# Patient Record
Sex: Female | Born: 1940
Health system: Southern US, Community
[De-identification: ages and names within clinical notes are randomized; demographics above are authoritative.]

## PROBLEM LIST (undated history)

## (undated) DIAGNOSIS — I651 Occlusion and stenosis of basilar artery: Secondary | ICD-10-CM

## (undated) DIAGNOSIS — I10 Essential (primary) hypertension: Secondary | ICD-10-CM

## (undated) DIAGNOSIS — N3289 Other specified disorders of bladder: Secondary | ICD-10-CM

## (undated) DIAGNOSIS — T883XXA Malignant hyperthermia due to anesthesia, initial encounter: Secondary | ICD-10-CM

## (undated) DIAGNOSIS — I2510 Atherosclerotic heart disease of native coronary artery without angina pectoris: Secondary | ICD-10-CM

## (undated) DIAGNOSIS — N3010 Interstitial cystitis (chronic) without hematuria: Secondary | ICD-10-CM

## (undated) DIAGNOSIS — E039 Hypothyroidism, unspecified: Secondary | ICD-10-CM

## (undated) DIAGNOSIS — Z8489 Family history of other specified conditions: Secondary | ICD-10-CM

## (undated) DIAGNOSIS — R0989 Other specified symptoms and signs involving the circulatory and respiratory systems: Secondary | ICD-10-CM

## (undated) DIAGNOSIS — M797 Fibromyalgia: Secondary | ICD-10-CM

## (undated) DIAGNOSIS — Z950 Presence of cardiac pacemaker: Secondary | ICD-10-CM

## (undated) DIAGNOSIS — E669 Obesity, unspecified: Secondary | ICD-10-CM

## (undated) DIAGNOSIS — M1990 Unspecified osteoarthritis, unspecified site: Secondary | ICD-10-CM

## (undated) DIAGNOSIS — G459 Transient cerebral ischemic attack, unspecified: Secondary | ICD-10-CM

## (undated) DIAGNOSIS — I441 Atrioventricular block, second degree: Secondary | ICD-10-CM

## (undated) DIAGNOSIS — Z5189 Encounter for other specified aftercare: Secondary | ICD-10-CM

## (undated) DIAGNOSIS — J189 Pneumonia, unspecified organism: Secondary | ICD-10-CM

## (undated) DIAGNOSIS — I6522 Occlusion and stenosis of left carotid artery: Secondary | ICD-10-CM

## (undated) DIAGNOSIS — IMO0001 Reserved for inherently not codable concepts without codable children: Secondary | ICD-10-CM

## (undated) DIAGNOSIS — E785 Hyperlipidemia, unspecified: Secondary | ICD-10-CM

## (undated) HISTORY — DX: Occlusion and stenosis of basilar artery: I65.1

## (undated) HISTORY — DX: Hyperlipidemia, unspecified: E78.5

## (undated) HISTORY — PX: EYE SURGERY: SHX253

## (undated) HISTORY — PX: OTHER SURGICAL HISTORY: SHX169

## (undated) HISTORY — DX: Atherosclerotic heart disease of native coronary artery without angina pectoris: I25.10

## (undated) HISTORY — DX: Interstitial cystitis (chronic) without hematuria: N30.10

## (undated) HISTORY — DX: Fibromyalgia: M79.7

## (undated) HISTORY — DX: Other specified symptoms and signs involving the circulatory and respiratory systems: R09.89

## (undated) HISTORY — DX: Occlusion and stenosis of left carotid artery: I65.22

## (undated) HISTORY — DX: Other specified disorders of bladder: N32.89

## (undated) HISTORY — DX: Atrioventricular block, second degree: I44.1

## (undated) HISTORY — PX: CATARACT EXTRACTION W/ INTRAOCULAR LENS IMPLANT: SHX1309

## (undated) HISTORY — DX: Hypothyroidism, unspecified: E03.9

## (undated) HISTORY — DX: Obesity, unspecified: E66.9

---

## 1972-01-06 HISTORY — PX: BLADDER SUSPENSION: SHX72

## 1972-01-06 HISTORY — PX: VAGINAL HYSTERECTOMY: SUR661

## 1983-01-06 HISTORY — PX: CHOLECYSTECTOMY: SHX55

## 1988-01-06 DIAGNOSIS — E039 Hypothyroidism, unspecified: Secondary | ICD-10-CM

## 1988-01-06 HISTORY — DX: Hypothyroidism, unspecified: E03.9

## 1991-01-06 HISTORY — PX: CARDIAC CATHETERIZATION: SHX172

## 1991-01-06 HISTORY — PX: RENAL ANGIOPLASTY: SHX2316

## 1991-08-06 DIAGNOSIS — R0989 Other specified symptoms and signs involving the circulatory and respiratory systems: Secondary | ICD-10-CM

## 1991-08-06 HISTORY — DX: Other specified symptoms and signs involving the circulatory and respiratory systems: R09.89

## 1991-11-06 HISTORY — PX: OTHER SURGICAL HISTORY: SHX169

## 1991-12-06 HISTORY — PX: US RENAL/AORTA: HXRAD530

## 1992-08-05 ENCOUNTER — Encounter: Payer: Self-pay | Admitting: Family Medicine

## 1996-04-05 HISTORY — PX: OTHER SURGICAL HISTORY: SHX169

## 1997-01-05 ENCOUNTER — Encounter: Payer: Self-pay | Admitting: Family Medicine

## 1997-01-05 DIAGNOSIS — E785 Hyperlipidemia, unspecified: Secondary | ICD-10-CM

## 1997-01-05 HISTORY — DX: Hyperlipidemia, unspecified: E78.5

## 1999-01-06 HISTORY — PX: OTHER SURGICAL HISTORY: SHX169

## 1999-01-28 ENCOUNTER — Encounter: Payer: Self-pay | Admitting: Family Medicine

## 1999-01-28 ENCOUNTER — Encounter: Admission: RE | Admit: 1999-01-28 | Discharge: 1999-01-28 | Payer: Self-pay | Admitting: Family Medicine

## 2000-01-29 ENCOUNTER — Encounter: Payer: Self-pay | Admitting: Family Medicine

## 2000-01-29 ENCOUNTER — Encounter: Admission: RE | Admit: 2000-01-29 | Discharge: 2000-01-29 | Payer: Self-pay | Admitting: Family Medicine

## 2002-01-05 ENCOUNTER — Encounter: Payer: Self-pay | Admitting: Family Medicine

## 2002-01-05 LAB — CONVERTED CEMR LAB: Pap Smear: NORMAL

## 2002-01-26 ENCOUNTER — Other Ambulatory Visit: Admission: RE | Admit: 2002-01-26 | Discharge: 2002-01-26 | Payer: Self-pay | Admitting: Family Medicine

## 2002-02-06 ENCOUNTER — Encounter: Payer: Self-pay | Admitting: Family Medicine

## 2002-02-06 ENCOUNTER — Encounter: Admission: RE | Admit: 2002-02-06 | Discharge: 2002-02-06 | Payer: Self-pay | Admitting: Family Medicine

## 2003-01-30 HISTORY — PX: OTHER SURGICAL HISTORY: SHX169

## 2003-02-27 ENCOUNTER — Encounter: Admission: RE | Admit: 2003-02-27 | Discharge: 2003-02-27 | Payer: Self-pay | Admitting: Family Medicine

## 2003-11-21 ENCOUNTER — Ambulatory Visit: Payer: Self-pay | Admitting: Family Medicine

## 2003-12-19 ENCOUNTER — Ambulatory Visit: Payer: Self-pay | Admitting: Family Medicine

## 2004-01-16 ENCOUNTER — Ambulatory Visit: Payer: Self-pay | Admitting: Family Medicine

## 2004-02-19 ENCOUNTER — Ambulatory Visit: Payer: Self-pay | Admitting: Family Medicine

## 2004-02-21 ENCOUNTER — Ambulatory Visit: Payer: Self-pay | Admitting: Family Medicine

## 2004-03-05 ENCOUNTER — Ambulatory Visit: Payer: Self-pay | Admitting: Family Medicine

## 2004-03-06 ENCOUNTER — Encounter: Admission: RE | Admit: 2004-03-06 | Discharge: 2004-03-06 | Payer: Self-pay | Admitting: Family Medicine

## 2004-03-10 ENCOUNTER — Ambulatory Visit: Payer: Self-pay | Admitting: Family Medicine

## 2004-03-21 ENCOUNTER — Ambulatory Visit: Payer: Self-pay | Admitting: Family Medicine

## 2004-03-28 ENCOUNTER — Ambulatory Visit: Payer: Self-pay | Admitting: Family Medicine

## 2004-04-11 ENCOUNTER — Ambulatory Visit: Payer: Self-pay | Admitting: Family Medicine

## 2004-05-09 ENCOUNTER — Ambulatory Visit: Payer: Self-pay | Admitting: Family Medicine

## 2004-06-06 ENCOUNTER — Ambulatory Visit: Payer: Self-pay | Admitting: Family Medicine

## 2004-06-13 ENCOUNTER — Emergency Department: Payer: Self-pay | Admitting: Emergency Medicine

## 2004-06-15 ENCOUNTER — Emergency Department: Payer: Self-pay | Admitting: Emergency Medicine

## 2004-07-09 ENCOUNTER — Ambulatory Visit: Payer: Self-pay | Admitting: Family Medicine

## 2004-08-07 ENCOUNTER — Ambulatory Visit: Payer: Self-pay | Admitting: Family Medicine

## 2004-08-19 ENCOUNTER — Ambulatory Visit: Payer: Self-pay | Admitting: Family Medicine

## 2004-09-09 ENCOUNTER — Ambulatory Visit: Payer: Self-pay | Admitting: Family Medicine

## 2004-10-07 ENCOUNTER — Ambulatory Visit: Payer: Self-pay | Admitting: Family Medicine

## 2004-10-15 HISTORY — PX: OTHER SURGICAL HISTORY: SHX169

## 2004-10-28 ENCOUNTER — Ambulatory Visit: Payer: Self-pay | Admitting: Family Medicine

## 2004-11-17 ENCOUNTER — Ambulatory Visit: Payer: Self-pay | Admitting: Family Medicine

## 2004-12-15 ENCOUNTER — Ambulatory Visit: Payer: Self-pay | Admitting: Family Medicine

## 2005-01-12 ENCOUNTER — Ambulatory Visit: Payer: Self-pay | Admitting: Family Medicine

## 2005-01-27 ENCOUNTER — Ambulatory Visit: Payer: Self-pay | Admitting: Family Medicine

## 2005-02-04 ENCOUNTER — Ambulatory Visit: Payer: Self-pay | Admitting: Family Medicine

## 2005-02-10 ENCOUNTER — Ambulatory Visit: Payer: Self-pay | Admitting: Family Medicine

## 2005-02-18 ENCOUNTER — Ambulatory Visit: Payer: Self-pay | Admitting: Family Medicine

## 2005-03-05 ENCOUNTER — Encounter: Payer: Self-pay | Admitting: Family Medicine

## 2005-03-05 LAB — CONVERTED CEMR LAB: Hgb A1c MFr Bld: 6.2 %

## 2005-03-09 ENCOUNTER — Encounter: Admission: RE | Admit: 2005-03-09 | Discharge: 2005-03-09 | Payer: Self-pay | Admitting: Family Medicine

## 2005-03-10 ENCOUNTER — Ambulatory Visit: Payer: Self-pay | Admitting: Family Medicine

## 2005-03-12 ENCOUNTER — Ambulatory Visit: Payer: Self-pay | Admitting: Family Medicine

## 2005-03-19 ENCOUNTER — Ambulatory Visit: Payer: Self-pay | Admitting: Family Medicine

## 2005-04-07 ENCOUNTER — Ambulatory Visit: Payer: Self-pay | Admitting: Family Medicine

## 2005-05-05 ENCOUNTER — Ambulatory Visit: Payer: Self-pay | Admitting: Family Medicine

## 2005-05-05 LAB — CONVERTED CEMR LAB: Hgb A1c MFr Bld: 5.9 %

## 2005-05-19 ENCOUNTER — Ambulatory Visit: Payer: Self-pay | Admitting: Family Medicine

## 2005-06-03 ENCOUNTER — Ambulatory Visit: Payer: Self-pay | Admitting: Family Medicine

## 2005-06-10 ENCOUNTER — Ambulatory Visit: Payer: Self-pay | Admitting: Family Medicine

## 2005-06-24 ENCOUNTER — Ambulatory Visit: Payer: Self-pay | Admitting: Family Medicine

## 2005-07-09 ENCOUNTER — Ambulatory Visit: Payer: Self-pay | Admitting: Family Medicine

## 2005-07-23 ENCOUNTER — Ambulatory Visit: Payer: Self-pay | Admitting: Family Medicine

## 2005-08-20 ENCOUNTER — Ambulatory Visit: Payer: Self-pay | Admitting: Family Medicine

## 2005-09-17 ENCOUNTER — Ambulatory Visit: Payer: Self-pay | Admitting: Family Medicine

## 2005-10-15 ENCOUNTER — Ambulatory Visit: Payer: Self-pay | Admitting: Family Medicine

## 2005-11-05 ENCOUNTER — Encounter: Payer: Self-pay | Admitting: Family Medicine

## 2005-11-05 LAB — CONVERTED CEMR LAB: Hgb A1c MFr Bld: 6.1 %

## 2005-11-12 ENCOUNTER — Ambulatory Visit: Payer: Self-pay | Admitting: Family Medicine

## 2005-12-03 ENCOUNTER — Ambulatory Visit: Payer: Self-pay | Admitting: Family Medicine

## 2005-12-11 ENCOUNTER — Ambulatory Visit: Payer: Self-pay | Admitting: Family Medicine

## 2006-01-08 ENCOUNTER — Ambulatory Visit: Payer: Self-pay | Admitting: Family Medicine

## 2006-02-05 ENCOUNTER — Ambulatory Visit: Payer: Self-pay | Admitting: Family Medicine

## 2006-03-05 ENCOUNTER — Ambulatory Visit: Payer: Self-pay | Admitting: Family Medicine

## 2006-04-06 ENCOUNTER — Ambulatory Visit: Payer: Self-pay | Admitting: Ophthalmology

## 2006-04-12 ENCOUNTER — Ambulatory Visit: Payer: Self-pay | Admitting: Family Medicine

## 2006-04-13 ENCOUNTER — Ambulatory Visit: Payer: Self-pay | Admitting: Ophthalmology

## 2006-04-27 ENCOUNTER — Ambulatory Visit: Payer: Self-pay | Admitting: Family Medicine

## 2006-04-28 ENCOUNTER — Encounter: Payer: Self-pay | Admitting: Family Medicine

## 2006-04-28 DIAGNOSIS — N951 Menopausal and female climacteric states: Secondary | ICD-10-CM | POA: Insufficient documentation

## 2006-04-28 DIAGNOSIS — H3320 Serous retinal detachment, unspecified eye: Secondary | ICD-10-CM | POA: Insufficient documentation

## 2006-04-28 DIAGNOSIS — G43009 Migraine without aura, not intractable, without status migrainosus: Secondary | ICD-10-CM | POA: Insufficient documentation

## 2006-04-28 DIAGNOSIS — IMO0001 Reserved for inherently not codable concepts without codable children: Secondary | ICD-10-CM

## 2006-05-04 ENCOUNTER — Ambulatory Visit: Payer: Self-pay | Admitting: Ophthalmology

## 2006-05-11 ENCOUNTER — Ambulatory Visit: Payer: Self-pay | Admitting: Ophthalmology

## 2006-05-18 ENCOUNTER — Ambulatory Visit: Payer: Self-pay | Admitting: Family Medicine

## 2006-05-18 DIAGNOSIS — I10 Essential (primary) hypertension: Secondary | ICD-10-CM | POA: Insufficient documentation

## 2006-05-18 LAB — CONVERTED CEMR LAB: INR: 2.3

## 2006-06-15 ENCOUNTER — Ambulatory Visit: Payer: Self-pay | Admitting: Family Medicine

## 2006-07-15 ENCOUNTER — Ambulatory Visit: Payer: Self-pay | Admitting: Family Medicine

## 2006-07-15 LAB — CONVERTED CEMR LAB: Prothrombin Time: 22.2 s

## 2006-08-12 ENCOUNTER — Ambulatory Visit: Payer: Self-pay | Admitting: Family Medicine

## 2006-08-12 LAB — CONVERTED CEMR LAB: INR: 2.3

## 2006-09-09 ENCOUNTER — Telehealth (INDEPENDENT_AMBULATORY_CARE_PROVIDER_SITE_OTHER): Payer: Self-pay | Admitting: *Deleted

## 2006-09-09 ENCOUNTER — Ambulatory Visit: Payer: Self-pay | Admitting: Family Medicine

## 2006-09-09 LAB — CONVERTED CEMR LAB
INR: 2.1
Prothrombin Time: 17.7 s

## 2006-10-07 ENCOUNTER — Ambulatory Visit: Payer: Self-pay | Admitting: Family Medicine

## 2006-10-07 LAB — CONVERTED CEMR LAB: INR: 2.2

## 2006-11-11 ENCOUNTER — Ambulatory Visit: Payer: Self-pay | Admitting: Family Medicine

## 2006-12-09 ENCOUNTER — Ambulatory Visit: Payer: Self-pay | Admitting: Family Medicine

## 2006-12-09 LAB — CONVERTED CEMR LAB: INR: 2.4

## 2006-12-28 ENCOUNTER — Ambulatory Visit: Payer: Self-pay | Admitting: Family Medicine

## 2006-12-28 DIAGNOSIS — E7800 Pure hypercholesterolemia, unspecified: Secondary | ICD-10-CM | POA: Insufficient documentation

## 2006-12-28 LAB — CONVERTED CEMR LAB
BUN: 12 mg/dL (ref 6–23)
Basophils Relative: 0 % (ref 0.0–1.0)
Bilirubin, Direct: 0.2 mg/dL (ref 0.0–0.3)
CO2: 33 meq/L — ABNORMAL HIGH (ref 19–32)
Eosinophils Relative: 5.2 % — ABNORMAL HIGH (ref 0.0–5.0)
GFR calc Af Amer: 81 mL/min
Glucose, Bld: 119 mg/dL — ABNORMAL HIGH (ref 70–99)
Hemoglobin: 15.1 g/dL — ABNORMAL HIGH (ref 12.0–15.0)
Lymphocytes Relative: 30.3 % (ref 12.0–46.0)
Monocytes Absolute: 0.5 10*3/uL (ref 0.2–0.7)
Monocytes Relative: 8.6 % (ref 3.0–11.0)
Neutro Abs: 3.4 10*3/uL (ref 1.4–7.7)
Potassium: 3.4 meq/L — ABNORMAL LOW (ref 3.5–5.1)
TSH: 0.6 microintl units/mL (ref 0.35–5.50)
Total Protein: 6.5 g/dL (ref 6.0–8.3)

## 2007-01-05 ENCOUNTER — Ambulatory Visit: Payer: Self-pay | Admitting: Family Medicine

## 2007-01-13 ENCOUNTER — Encounter: Admission: RE | Admit: 2007-01-13 | Discharge: 2007-01-13 | Payer: Self-pay | Admitting: Family Medicine

## 2007-01-18 ENCOUNTER — Encounter (INDEPENDENT_AMBULATORY_CARE_PROVIDER_SITE_OTHER): Payer: Self-pay | Admitting: *Deleted

## 2007-01-21 ENCOUNTER — Ambulatory Visit: Payer: Self-pay | Admitting: Family Medicine

## 2007-01-24 ENCOUNTER — Encounter (INDEPENDENT_AMBULATORY_CARE_PROVIDER_SITE_OTHER): Payer: Self-pay | Admitting: *Deleted

## 2007-01-27 ENCOUNTER — Telehealth (INDEPENDENT_AMBULATORY_CARE_PROVIDER_SITE_OTHER): Payer: Self-pay | Admitting: *Deleted

## 2007-01-28 ENCOUNTER — Encounter: Payer: Self-pay | Admitting: Family Medicine

## 2007-02-02 ENCOUNTER — Ambulatory Visit: Payer: Self-pay | Admitting: Family Medicine

## 2007-02-02 LAB — CONVERTED CEMR LAB: INR: 3.2

## 2007-02-07 ENCOUNTER — Ambulatory Visit: Payer: Self-pay | Admitting: Family Medicine

## 2007-02-09 ENCOUNTER — Telehealth (INDEPENDENT_AMBULATORY_CARE_PROVIDER_SITE_OTHER): Payer: Self-pay | Admitting: *Deleted

## 2007-02-17 ENCOUNTER — Telehealth: Payer: Self-pay | Admitting: Family Medicine

## 2007-02-17 ENCOUNTER — Ambulatory Visit: Payer: Self-pay | Admitting: Family Medicine

## 2007-03-03 ENCOUNTER — Ambulatory Visit: Payer: Self-pay | Admitting: Family Medicine

## 2007-03-09 ENCOUNTER — Telehealth: Payer: Self-pay | Admitting: Family Medicine

## 2007-03-23 ENCOUNTER — Emergency Department: Payer: Self-pay | Admitting: Emergency Medicine

## 2007-03-24 ENCOUNTER — Ambulatory Visit: Payer: Self-pay | Admitting: Family Medicine

## 2007-03-24 LAB — CONVERTED CEMR LAB
INR: 2.8
Prothrombin Time: 20.2 s

## 2007-04-19 ENCOUNTER — Ambulatory Visit: Payer: Self-pay | Admitting: Family Medicine

## 2007-04-19 LAB — CONVERTED CEMR LAB: Prothrombin Time: 18.4 s

## 2007-05-18 ENCOUNTER — Ambulatory Visit: Payer: Self-pay | Admitting: Family Medicine

## 2007-05-18 LAB — CONVERTED CEMR LAB: Prothrombin Time: 19.2 s

## 2007-06-14 ENCOUNTER — Ambulatory Visit: Payer: Self-pay | Admitting: Family Medicine

## 2007-06-14 LAB — CONVERTED CEMR LAB: Prothrombin Time: 18.2 s

## 2007-07-12 ENCOUNTER — Ambulatory Visit: Payer: Self-pay | Admitting: Family Medicine

## 2007-07-12 LAB — CONVERTED CEMR LAB: INR: 2.6

## 2007-08-09 ENCOUNTER — Ambulatory Visit: Payer: Self-pay | Admitting: Family Medicine

## 2007-08-09 LAB — CONVERTED CEMR LAB: INR: 2.8

## 2007-09-06 ENCOUNTER — Ambulatory Visit: Payer: Self-pay | Admitting: Family Medicine

## 2007-10-04 ENCOUNTER — Ambulatory Visit: Payer: Self-pay | Admitting: Family Medicine

## 2007-10-04 LAB — CONVERTED CEMR LAB
INR: 2.1
Prothrombin Time: 17.9 s

## 2007-10-28 ENCOUNTER — Telehealth: Payer: Self-pay | Admitting: Family Medicine

## 2007-11-10 ENCOUNTER — Telehealth: Payer: Self-pay | Admitting: Family Medicine

## 2007-11-10 ENCOUNTER — Ambulatory Visit: Payer: Self-pay | Admitting: Family Medicine

## 2007-11-10 LAB — CONVERTED CEMR LAB: INR: 4.1

## 2007-11-23 ENCOUNTER — Ambulatory Visit: Payer: Self-pay | Admitting: Family Medicine

## 2007-11-23 LAB — CONVERTED CEMR LAB
INR: 2.5
Prothrombin Time: 19.3 s

## 2007-12-22 ENCOUNTER — Ambulatory Visit: Payer: Self-pay | Admitting: Family Medicine

## 2008-01-07 ENCOUNTER — Telehealth: Payer: Self-pay | Admitting: Internal Medicine

## 2008-01-07 ENCOUNTER — Emergency Department (HOSPITAL_COMMUNITY): Admission: EM | Admit: 2008-01-07 | Discharge: 2008-01-07 | Payer: Self-pay | Admitting: Emergency Medicine

## 2008-01-18 ENCOUNTER — Ambulatory Visit: Payer: Self-pay | Admitting: Family Medicine

## 2008-01-18 LAB — CONVERTED CEMR LAB
Bacteria, UA: 0
Bilirubin Urine: NEGATIVE
Prothrombin Time: 17.5 s
RBC / HPF: 0
Specific Gravity, Urine: 1.015
Urobilinogen, UA: 0.2
WBC Urine, dipstick: NEGATIVE
pH: 5

## 2008-01-19 ENCOUNTER — Encounter: Payer: Self-pay | Admitting: Family Medicine

## 2008-02-13 ENCOUNTER — Telehealth (INDEPENDENT_AMBULATORY_CARE_PROVIDER_SITE_OTHER): Payer: Self-pay | Admitting: *Deleted

## 2008-02-15 ENCOUNTER — Ambulatory Visit: Payer: Self-pay | Admitting: Family Medicine

## 2008-02-15 LAB — CONVERTED CEMR LAB
INR: 2.2
Prothrombin Time: 18.3 s

## 2008-02-21 ENCOUNTER — Ambulatory Visit: Payer: Self-pay | Admitting: Family Medicine

## 2008-03-14 ENCOUNTER — Ambulatory Visit: Payer: Self-pay | Admitting: Family Medicine

## 2008-04-17 ENCOUNTER — Ambulatory Visit: Payer: Self-pay | Admitting: Family Medicine

## 2008-04-17 LAB — CONVERTED CEMR LAB: Prothrombin Time: 18.1 s

## 2008-05-15 ENCOUNTER — Ambulatory Visit: Payer: Self-pay | Admitting: Family Medicine

## 2008-05-15 LAB — CONVERTED CEMR LAB: INR: 2.2

## 2008-06-20 ENCOUNTER — Ambulatory Visit: Payer: Self-pay | Admitting: Family Medicine

## 2008-06-20 LAB — CONVERTED CEMR LAB
INR: 1.8
Prothrombin Time: 16.5 s

## 2008-06-21 LAB — CONVERTED CEMR LAB
ALT: 22 units/L (ref 0–35)
AST: 21 units/L (ref 0–37)
BUN: 16 mg/dL (ref 6–23)
Basophils Relative: 0.7 % (ref 0.0–3.0)
Bilirubin, Direct: 0.1 mg/dL (ref 0.0–0.3)
CO2: 32 meq/L (ref 19–32)
Calcium: 9.1 mg/dL (ref 8.4–10.5)
Cholesterol: 204 mg/dL — ABNORMAL HIGH (ref 0–200)
Eosinophils Relative: 3.7 % (ref 0.0–5.0)
Glucose, Bld: 105 mg/dL — ABNORMAL HIGH (ref 70–99)
HCT: 41.4 % (ref 36.0–46.0)
HDL: 37.7 mg/dL — ABNORMAL LOW (ref >39.00)
MCV: 88.1 fL (ref 78.0–100.0)
Monocytes Absolute: 0.5 10*3/uL (ref 0.1–1.0)
Monocytes Relative: 7.4 % (ref 3.0–12.0)
Neutrophils Relative %: 58.7 % (ref 43.0–77.0)
RBC: 4.7 M/uL (ref 3.87–5.11)
Sed Rate: 17 mm/hr (ref 0–22)
Sodium: 142 meq/L (ref 135–145)
Total Protein: 7 g/dL (ref 6.0–8.3)
Triglycerides: 183 mg/dL — ABNORMAL HIGH (ref 0.0–149.0)

## 2008-06-28 ENCOUNTER — Other Ambulatory Visit: Admission: RE | Admit: 2008-06-28 | Discharge: 2008-06-28 | Payer: Self-pay | Admitting: Family Medicine

## 2008-06-28 ENCOUNTER — Encounter: Payer: Self-pay | Admitting: Family Medicine

## 2008-06-28 ENCOUNTER — Ambulatory Visit: Payer: Self-pay | Admitting: Family Medicine

## 2008-06-28 DIAGNOSIS — R7309 Other abnormal glucose: Secondary | ICD-10-CM

## 2008-06-28 DIAGNOSIS — E559 Vitamin D deficiency, unspecified: Secondary | ICD-10-CM | POA: Insufficient documentation

## 2008-06-28 LAB — CONVERTED CEMR LAB
INR: 2.2
Pap Smear: NORMAL
Prothrombin Time: 18.2 s

## 2008-07-04 ENCOUNTER — Encounter (INDEPENDENT_AMBULATORY_CARE_PROVIDER_SITE_OTHER): Payer: Self-pay | Admitting: *Deleted

## 2008-07-10 ENCOUNTER — Ambulatory Visit: Payer: Self-pay | Admitting: Family Medicine

## 2008-07-10 DIAGNOSIS — R269 Unspecified abnormalities of gait and mobility: Secondary | ICD-10-CM | POA: Insufficient documentation

## 2008-07-10 DIAGNOSIS — I10 Essential (primary) hypertension: Secondary | ICD-10-CM | POA: Insufficient documentation

## 2008-07-11 ENCOUNTER — Encounter: Admission: RE | Admit: 2008-07-11 | Discharge: 2008-07-11 | Payer: Self-pay | Admitting: Family Medicine

## 2008-07-12 ENCOUNTER — Ambulatory Visit: Payer: Self-pay | Admitting: Family Medicine

## 2008-07-12 DIAGNOSIS — I651 Occlusion and stenosis of basilar artery: Secondary | ICD-10-CM

## 2008-07-17 ENCOUNTER — Ambulatory Visit: Payer: Self-pay | Admitting: Family Medicine

## 2008-07-18 ENCOUNTER — Encounter: Payer: Self-pay | Admitting: Interventional Radiology

## 2008-07-19 ENCOUNTER — Ambulatory Visit (HOSPITAL_COMMUNITY): Admission: RE | Admit: 2008-07-19 | Discharge: 2008-07-19 | Payer: Self-pay | Admitting: Interventional Radiology

## 2008-07-19 HISTORY — PX: OTHER SURGICAL HISTORY: SHX169

## 2008-07-20 ENCOUNTER — Telehealth: Payer: Self-pay | Admitting: Family Medicine

## 2008-07-20 ENCOUNTER — Encounter: Payer: Self-pay | Admitting: Family Medicine

## 2008-07-20 ENCOUNTER — Ambulatory Visit: Payer: Self-pay

## 2008-07-26 ENCOUNTER — Encounter: Payer: Self-pay | Admitting: Interventional Radiology

## 2008-08-04 ENCOUNTER — Telehealth: Payer: Self-pay | Admitting: Family Medicine

## 2008-09-04 ENCOUNTER — Inpatient Hospital Stay (HOSPITAL_COMMUNITY): Admission: RE | Admit: 2008-09-04 | Discharge: 2008-09-06 | Payer: Self-pay | Admitting: Interventional Radiology

## 2008-09-04 HISTORY — PX: OTHER SURGICAL HISTORY: SHX169

## 2008-09-06 ENCOUNTER — Telehealth (INDEPENDENT_AMBULATORY_CARE_PROVIDER_SITE_OTHER): Payer: Self-pay | Admitting: *Deleted

## 2008-09-19 ENCOUNTER — Ambulatory Visit: Payer: Self-pay | Admitting: Family Medicine

## 2008-09-19 ENCOUNTER — Encounter: Payer: Self-pay | Admitting: Interventional Radiology

## 2008-09-22 LAB — CONVERTED CEMR LAB: Vit D, 25-Hydroxy: 22 ng/mL — ABNORMAL LOW (ref 30–89)

## 2008-09-25 ENCOUNTER — Ambulatory Visit: Payer: Self-pay | Admitting: Family Medicine

## 2008-12-10 ENCOUNTER — Ambulatory Visit (HOSPITAL_COMMUNITY): Admission: RE | Admit: 2008-12-10 | Discharge: 2008-12-10 | Payer: Self-pay | Admitting: Interventional Radiology

## 2009-01-08 ENCOUNTER — Ambulatory Visit: Payer: Self-pay | Admitting: Family Medicine

## 2009-02-18 ENCOUNTER — Ambulatory Visit: Payer: Self-pay | Admitting: Family Medicine

## 2009-02-18 DIAGNOSIS — M79609 Pain in unspecified limb: Secondary | ICD-10-CM

## 2009-02-28 ENCOUNTER — Telehealth: Payer: Self-pay | Admitting: Family Medicine

## 2009-03-28 ENCOUNTER — Ambulatory Visit (HOSPITAL_COMMUNITY): Admission: RE | Admit: 2009-03-28 | Discharge: 2009-03-28 | Payer: Self-pay | Admitting: Interventional Radiology

## 2009-04-24 ENCOUNTER — Ambulatory Visit: Payer: Self-pay | Admitting: Family Medicine

## 2009-04-24 DIAGNOSIS — R300 Dysuria: Secondary | ICD-10-CM

## 2009-04-24 LAB — CONVERTED CEMR LAB
Ketones, urine, test strip: NEGATIVE
Urobilinogen, UA: 0.2
pH: 5

## 2009-05-06 ENCOUNTER — Telehealth: Payer: Self-pay | Admitting: Cardiovascular Disease

## 2009-05-06 ENCOUNTER — Telehealth: Payer: Self-pay | Admitting: Family Medicine

## 2009-05-07 ENCOUNTER — Telehealth: Payer: Self-pay | Admitting: Family Medicine

## 2009-05-07 ENCOUNTER — Encounter: Payer: Self-pay | Admitting: Family Medicine

## 2009-05-08 ENCOUNTER — Ambulatory Visit: Payer: Self-pay | Admitting: Family Medicine

## 2009-06-10 ENCOUNTER — Ambulatory Visit: Payer: Self-pay | Admitting: Cardiovascular Disease

## 2009-06-10 ENCOUNTER — Encounter: Payer: Self-pay | Admitting: Cardiovascular Disease

## 2009-06-10 DIAGNOSIS — R0602 Shortness of breath: Secondary | ICD-10-CM | POA: Insufficient documentation

## 2009-06-10 DIAGNOSIS — I739 Peripheral vascular disease, unspecified: Secondary | ICD-10-CM

## 2009-06-10 DIAGNOSIS — R072 Precordial pain: Secondary | ICD-10-CM | POA: Insufficient documentation

## 2009-06-13 ENCOUNTER — Inpatient Hospital Stay (HOSPITAL_BASED_OUTPATIENT_CLINIC_OR_DEPARTMENT_OTHER): Admission: RE | Admit: 2009-06-13 | Discharge: 2009-06-13 | Payer: Self-pay | Admitting: Cardiovascular Disease

## 2009-06-13 ENCOUNTER — Ambulatory Visit: Payer: Self-pay | Admitting: Cardiovascular Disease

## 2009-06-13 HISTORY — PX: OTHER SURGICAL HISTORY: SHX169

## 2009-06-19 ENCOUNTER — Ambulatory Visit: Payer: Self-pay | Admitting: Cardiovascular Disease

## 2009-06-19 ENCOUNTER — Inpatient Hospital Stay (HOSPITAL_COMMUNITY): Admission: RE | Admit: 2009-06-19 | Discharge: 2009-06-20 | Payer: Self-pay | Admitting: Cardiovascular Disease

## 2009-06-19 HISTORY — PX: CORONARY ANGIOPLASTY WITH STENT PLACEMENT: SHX49

## 2009-07-04 ENCOUNTER — Ambulatory Visit: Payer: Self-pay | Admitting: Cardiovascular Disease

## 2009-07-04 DIAGNOSIS — I2510 Atherosclerotic heart disease of native coronary artery without angina pectoris: Secondary | ICD-10-CM | POA: Insufficient documentation

## 2009-07-05 ENCOUNTER — Telehealth: Payer: Self-pay | Admitting: Cardiovascular Disease

## 2009-07-18 ENCOUNTER — Telehealth: Payer: Self-pay | Admitting: Cardiovascular Disease

## 2009-08-07 ENCOUNTER — Ambulatory Visit: Payer: Self-pay | Admitting: Family Medicine

## 2009-08-07 DIAGNOSIS — E039 Hypothyroidism, unspecified: Secondary | ICD-10-CM

## 2009-08-13 ENCOUNTER — Encounter (INDEPENDENT_AMBULATORY_CARE_PROVIDER_SITE_OTHER): Payer: Self-pay | Admitting: *Deleted

## 2009-09-04 ENCOUNTER — Ambulatory Visit (HOSPITAL_COMMUNITY): Admission: RE | Admit: 2009-09-04 | Discharge: 2009-09-04 | Payer: Self-pay | Admitting: Interventional Radiology

## 2009-09-26 ENCOUNTER — Ambulatory Visit: Payer: Self-pay | Admitting: Cardiovascular Disease

## 2009-09-30 LAB — CONVERTED CEMR LAB
ALT: 18 units/L (ref 0–35)
AST: 17 units/L (ref 0–37)
Albumin: 3.7 g/dL (ref 3.5–5.2)
Cholesterol: 121 mg/dL (ref 0–200)
HDL: 34.2 mg/dL — ABNORMAL LOW (ref >39.00)
Triglycerides: 128 mg/dL (ref 0.0–149.0)

## 2010-01-23 ENCOUNTER — Ambulatory Visit
Admission: RE | Admit: 2010-01-23 | Discharge: 2010-01-23 | Payer: Self-pay | Source: Home / Self Care | Attending: Cardiovascular Disease | Admitting: Cardiovascular Disease

## 2010-01-26 ENCOUNTER — Encounter: Payer: Self-pay | Admitting: Interventional Radiology

## 2010-01-27 ENCOUNTER — Encounter: Payer: Self-pay | Admitting: Interventional Radiology

## 2010-02-02 LAB — CONVERTED CEMR LAB
BUN: 19 mg/dL (ref 6–23)
Basophils Absolute: 0 10*3/uL (ref 0.0–0.1)
Calcium: 9.5 mg/dL (ref 8.4–10.5)
Creatinine, Ser: 0.9 mg/dL (ref 0.4–1.2)
Eosinophils Absolute: 0.2 10*3/uL (ref 0.0–0.7)
Eosinophils Relative: 3.3 % (ref 0.0–5.0)
GFR calc non Af Amer: 67.32 mL/min (ref >60)
HCT: 40.8 % (ref 36.0–46.0)
INR: 1 (ref 0.8–1.0)
Lymphocytes Relative: 31 % (ref 12.0–46.0)
Monocytes Relative: 9.6 % (ref 3.0–12.0)
Neutrophils Relative %: 55.5 % (ref 43.0–77.0)
Platelets: 251 10*3/uL (ref 150.0–400.0)
Potassium: 3.9 meq/L (ref 3.5–5.1)
Prothrombin Time: 11.2 s (ref 9.7–11.8)
RDW: 14.1 % (ref 11.5–14.6)
WBC: 5.5 10*3/uL (ref 4.5–10.5)

## 2010-02-06 NOTE — Assessment & Plan Note (Signed)
Summary: 2:00 COUGH,CONGESTION/CLE   Vital Signs:  Patient profile:   70 year old female Weight:      228.75 pounds BMI:     44.84 Temp:     99.3 degrees F oral Pulse rate:   84 / minute Pulse rhythm:   regular BP sitting:   132 / 82  (left arm) Cuff size:   large  Vitals Entered By: Linde Gillis CMA [name] Dull) (January 08, 2009 2:02 PM) CC: cough, congestion, patient says she is sore from head to toe   History of Present Illness: "I am sick." She is hoarse and congested. She has had Flu shot and Pneumonia shots this Fall. She is congested, can't breathe, when she coughs it burns in her chest and throat and near her shoulder blades down her back. Some of the cough production has red streaks.  She has had fever to 101 and has chills. She has lost 6 pounds and her ears hurt. She is having rhinitis and nasal congestion that is clear, Otherwise as above. She is SOB, had significant breathing problems last night and has some nausea with a lot of coughing, no vomiting. She has kept her fluids up. She has taken Tyl only. She is very achey from the top of her head with sensitive hair to the tips of her toes.  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) 2)  Essential Hypertension  (ICD-401.9) 3)  Gait Disturbance  (ICD-781.2) 4)  Hyperglycemia  (ICD-790.29) 5)  Unspecified Vitamin D Deficiency  (ICD-268.9) 6)  Sciatica, Left  (ICD-724.3) 7)  Special Screening Malig Neoplasms Other Sites  (ICD-V76.49) 8)  Health Maintenance Exam  (ICD-V70.0) 9)  Pure Hypercholesterolemia  (ICD-272.0) 10)  Hypertension, Malignant Essential  (ICD-401.0) 11)  Encounter For Therapeutic Drug Monitoring  (ICD-V58.83) 12)  Aftercare, Long-term Use, Anticoagulants  (ICD-V58.61) 13)  Fibromyalgia  (ICD-729.1) 14)  Interstitial Cystitis (TANNENBAUM)  (ICD-595.1) 15)  Carotid Artery Occlusion L Common  (ICD-433.10) 16)  Detached Retina  (ICD-361.9) 17)  Menopausal  Syndrome  (ICD-627.2) 18)  Common Migraine  (ICD-346.10)  Medications Prior to Update: 1)  Warfarin Sodium 5 Mg Tabs (Warfarin Sodium) .Marland Kitchen.. 1 Daily As Directed Per Protime (On Hold Till After Procedure) 2)  Hyzaar 100-25 Mg Tabs (Losartan Potassium-Hctz) .... One Tab By Mouth Once Daily 3)  Synthroid 100 Mcg Tabs (Levothyroxine Sodium) .Marland Kitchen.. 1 Daily By Mouth 4)  Verelan 360 Mg Xr24h-Cap (Verapamil Hcl) .Marland Kitchen.. 1 By Mouth Daily 5)  Vitamin D (Ergocalciferol) 50000 Unit Caps (Ergocalciferol) .... One Tab By Mouth Once A Week 6)  Buffered Aspirin 325 Mg Tabs (Aspirin Buf(Cacarb-Mgcarb-Mgo)) .Marland Kitchen.. 1 Daily By Mouth 7)  Valium 2 Mg Tabs (Diazepam) .... 1/2-1 Tab By Mouth Every 6 Hrs As Needed Anxiety. 8)  Plavix 75 Mg Tabs (Clopidogrel Bisulfate) .... Take One By Mouth Daily  Allergies: 1)  ! Codeine 2)  ! * Elmiron 3)  ! Macrobid 4)  ! * Norflex  Physical Exam  General:  alert, well-developed, well-nourished, and well-hydrated.  Obese. Congested and hoarse. Head:  normocephalic and atraumatic.  Sinuses minimally tender max distrib. Eyes:  Conjunctiva clear bilaterally. Eyes still sparkle. Ears:  External ear exam shows no significant lesions or deformities.  Otoscopic examination reveals clear canals, tympanic membranes are intact bilaterally without bulging, retraction, inflammation or discharge. Hearing is grossly normal bilaterally. TMs mildly dull to LR. Nose:  External nasal examination shows no deformity. Nasal mucosa are pink and moist without  lesions or exudates. Mucosa inflamed and slightly erythematous. Mouth:  Oral mucosa and oropharynx without lesions or exudates.  Neck:  No deformities, masses, or tenderness noted. Chest Wall:  No deformities, masses, or tenderness noted. Lungs:  Normal respiratory effort, chest expands symmetrically. Lungs are clear to auscultation, no crackles or wheezes. Heart:  Normal rate and regular rhythm. S1 and S2 normal without gallop, murmur, click, rub or  other extra sounds. Neurologic:  No cranial nerve deficits noted. Station and gait are normal. Plantar reflexes are down-going bilaterally. DTRs are symmetrical throughout. Sensory, motor and coordinative functions appear intact. On and off table easily, gait ok.   Impression & Recommendations:  Problem # 1:  URI (ICD-465.9) Assessment New  Congested and achey, presumed viral infection. See instructions. Her updated medication list for this problem includes:    Buffered Aspirin 325 Mg Tabs (Aspirin buf(cacarb-mgcarb-mgo)) .Marland Kitchen... 1 daily by mouth    Tessalon 200 Mg Caps (Benzonatate) ..... One tab by mouth three times a day as needed for cough  Instructed on symptomatic treatment. Call if symptoms persist or worsen.   Problem # 2:  BRONCHITIS- ACUTE (ICD-466.0) Assessment: New  Pt congested enough that she has signif risk of becoming a bacterial infection. Prophylax with Zithromax. Her updated medication list for this problem includes:    Zithromax Z-pak 250 Mg Tabs (Azithromycin) .Marland Kitchen... As dir    Tessalon 200 Mg Caps (Benzonatate) ..... One tab by mouth three times a day as needed for cough  Take antibiotics and other medications as directed. Encouraged to push clear liquids, get enough rest, and take acetaminophen as needed. To be seen in 5-7 days if no improvement, sooner if worse.  Complete Medication List: 1)  Hyzaar 100-25 Mg Tabs (Losartan potassium-hctz) .... One tab by mouth once daily 2)  Synthroid 100 Mcg Tabs (Levothyroxine sodium) .Marland Kitchen.. 1 daily by mouth 3)  Verelan 360 Mg Xr24h-cap (Verapamil hcl) .Marland Kitchen.. 1 by mouth daily 4)  Buffered Aspirin 325 Mg Tabs (Aspirin buf(cacarb-mgcarb-mgo)) .Marland Kitchen.. 1 daily by mouth 5)  Plavix 75 Mg Tabs (Clopidogrel bisulfate) .... Take one by mouth daily 6)  Zithromax Z-pak 250 Mg Tabs (Azithromycin) .... As dir 7)  Tessalon 200 Mg Caps (Benzonatate) .... One tab by mouth three times a day as needed for cough  Patient Instructions: 1)  Take  Zithromax. 2)  Take Guaifenesin by [facility] or [facility] and getting MUCOUS RELIEF EXPECTORANT (400mg ), take 11/2 tabs by mouth AM and NOON. 3)  Drink lots of fluids anytime taking Guaifenesin.  4)  Take Tyl ES 2 tab by mouth three times a day regularly. 5)  Keep lozenge in your mouth.  6)  Afrin 2 squirts each nostril one minute apart three times a day for three days. 7)  Then use vaseline /Vicks in the nostrils after cleansing as discussed. 8)  Gargle with warm salt water every half hour for 48hrs.Marland Kitchen 9)  Tessalon for cough suppression three times a day.  Prescriptions: TESSALON 200 MG CAPS (BENZONATATE) one tab by mouth three times a day as needed for cough  #40 x 0   Entered and Authorized by:   Shaune Leeks MD   Signed by:   Shaune Leeks MD on 01/08/2009   Method used:   [facility] to        [id number]       [address]       [place], [place]  [address]  Ph: [phone]       Fax: [phone]   RxID:   [id number] VZDGLOVFI Z-PAK 250 MG TABS (AZITHROMYCIN) as dir  #1 Pak x 0   Entered and Authorized by:   Shaune Leeks MD   Signed by:   Shaune Leeks MD on 01/08/2009   Method used:   Electronically to        [id number]       [address]       [place], [place]  [address]       Ph: [phone]       Fax: [phone]   RxID:   [id number]   Current Allergies (reviewed today): ! CODEINE ! * ELMIRON ! MACROBID ! * NORFLEX   Immunization History:  Pneumovax Immunization History:    Pneumovax:  historical (09/05/2008)

## 2010-02-06 NOTE — Assessment & Plan Note (Signed)
Summary: uti/dlo   Vital Signs:  Patient profile:   70 year old female Weight:      230.25 pounds Temp:     98.4 degrees F oral Pulse rate:   68 / minute Pulse rhythm:   regular BP sitting:   130 / 68  (left arm) Cuff size:   large  Vitals Entered By: Sydell Axon LPN (April 24, 2009 3:40 PM) CC: Burning with urination and itching in the vaginal area   History of Present Illness: Pt here for burning with urination and difficulty initiating stream. Sxs started late last night nad have gotten progressively worse. She has had no fever but has had itching after urinating. She feels like it is  an early UTI. She has had many in the past.  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) 4)  Essential Hypertension  (ICD-401.9) 5)  Gait Disturbance  (ICD-781.2) 6)  Hyperglycemia  (ICD-790.29) 7)  Unspecified Vitamin D Deficiency  (ICD-268.9) 8)  Sciatica, Left  (ICD-724.3) 9)  Special Screening Malig Neoplasms Other Sites  (ICD-V76.49) 10)  Health Maintenance Exam  (ICD-V70.0) 11)  Pure Hypercholesterolemia  (ICD-272.0) 12)  Hypertension, Malignant Essential  (ICD-401.0) 13)  Encounter For Therapeutic Drug Monitoring  (ICD-V58.83) 14)  Aftercare, Long-term Use, Anticoagulants  (ICD-V58.61) 15)  Fibromyalgia  (ICD-729.1) 16)  Interstitial Cystitis (TANNENBAUM)  (ICD-595.1) 17)  Carotid Artery Occlusion L Common  (ICD-433.10) 18)  Detached Retina  (ICD-361.9) 19)  Menopausal Syndrome  (ICD-627.2) 20)  Common Migraine  (ICD-346.10)  Medications Prior to Update: 1)  Hyzaar 100-25 Mg Tabs (Losartan Potassium-Hctz) .... One Tab By Mouth Once Daily 2)  Synthroid 100 Mcg Tabs (Levothyroxine Sodium) .Marland Kitchen.. 1 Daily By Mouth 3)  Verelan 360 Mg Xr24h-Cap (Verapamil Hcl) .Marland Kitchen.. 1 By Mouth Daily 4)  Buffered Aspirin 325 Mg Tabs (Aspirin Buf(Cacarb-Mgcarb-Mgo)) .Marland Kitchen.. 1 Daily By Mouth 5)  Plavix  75 Mg Tabs (Clopidogrel Bisulfate) .... Take One By Mouth Daily 6)  Fluconazole 150 Mg Tabs (Fluconazole) .... One Tab By Mouth Once Daily  Allergies: 1)  ! Codeine 2)  ! * Elmiron 3)  ! Macrobid 4)  ! * Norflex  Physical Exam  General:  alert, well-developed, well-nourished, and well-hydrated.  Obese. Interactive and nontoxic. Head:  normocephalic and atraumatic.  Eyes:  Conjunctiva clear bilaterally. Eyes still sparkle. Lungs:  Normal respiratory effort, chest expands symmetrically. Lungs are clear to auscultation, no crackles or wheezes. Heart:  Normal rate and regular rhythm. S1 and S2 normal without gallop, murmur, click, rub or other extra sounds. Abdomen:  No suprapubic tenderness. Msk:  No CVAT. Extremities:  Minimal swelling of distal right foot.   Impression & Recommendations:  Problem # 1:  DYSURIA (ICD-788.1) Assessment New Script given for Pyridium to aid urination and will culture urine. Her updated medication list for this problem includes:    Pyridium 200 Mg Tabs (Phenazopyridine hcl) ..... One tab by mouth three times a day as needed for burning  Orders: Specimen Handling ([address]) T-Culture, Urine ([address]) UA Dipstick W/ Micro (manual) (81000)  Encouraged to push clear liquids, get enough rest, and take acetaminophen as needed. To be seen in 10 days if no improvement, sooner if worse.  Complete Medication List: 1)  Hyzaar 100-25 Mg Tabs (Losartan potassium-hctz) .... One tab by mouth once daily 2)  Synthroid 100 Mcg Tabs (Levothyroxine sodium) .Marland Kitchen.. 1 daily by mouth 3)  Verelan 360 Mg Xr24h-cap (Verapamil hcl) .Marland Kitchen.. 1 by mouth daily 4)  Buffered Aspirin 325 Mg Tabs (Aspirin buf(cacarb-mgcarb-mgo)) .Marland Kitchen.. 1 daily by mouth 5)  Plavix 75 Mg Tabs (Clopidogrel bisulfate) .... Take one by mouth daily 6)  Fluconazole 150 Mg Tabs (Fluconazole) .... One tab by mouth once daily 7)  Pyridium 200 Mg Tabs (Phenazopyridine hcl) .... One tab by mouth three times a day as  needed for burning  Patient Instructions: 1)  Call if sxs worsen. Will report U/C when available. Prescriptions: PYRIDIUM 200 MG TABS (PHENAZOPYRIDINE HCL) one tab by mouth three times a day as needed for burning  #21 x 0   Entered and Authorized by:   Shaune Leeks MD   Signed by:   Shaune Leeks MD on 04/24/2009   Method used:   [facility]ron[facility] to        [id number]       [address]       [place], [place]  [address]       Ph: [phone]       Fax: [phone]   RxID:   [id number]   Current Allergies (reviewed today): ! CODEINE ! * [facility] ! MACROBID ! * NORFLEX  Laboratory Results   Urine Tests  Date/Time Received: April 24, 2009 3:56 PM  Date/Time Reported: April 24, 2009 3:56 PM   Routine Urinalysis   Color: yellow Appearance: Hazy Glucose: negative   (Normal Range: Negative) Bilirubin: negative   (Normal Range: Negative) Ketone: negative   (Normal Range: Negative) Spec. Gravity: >=1.030   (Normal Range: 1.003-1.035) Blood: trace-intact   (Normal Range: Negative) pH: 5.0   (Normal Range: 5.0-8.0) Protein: negative   (Normal Range: Negative) Urobilinogen: 0.2   (Normal Range: 0-1) Nitrite: negative   (Normal Range: Negative) Leukocyte Esterace: small   (Normal Range: Negative)

## 2010-02-06 NOTE — Progress Notes (Signed)
Summary: questions re substituting crestor  Phone Note Call from Patient   Caller: Patient [phone] Reason for Call: Talk to Nurse Summary of Call: has questions re the crestor-pt to start-insurance not covering-can she substitute with simastatin? pls call [phone] or [phone] Initial call taken by: Glynda Jaeger,  July 05, 2009 11:58 AM  Follow-up for Phone Call        Spoke with pt. Patient states Dr. Clifton James gave her samples of Crestor 10 mg to start taken. Pt. states she is not able to start take this medication because the copay is very expensive. Pt. would like for MD to change medication to Simvastatin instead. Ollen Gross, RN, BSN  July 05, 2009 12:22 PM   Additional Follow-up for Phone Call Additional follow up Details #1::        OK to start Simvastatin 20 mg by mouth at bedtime. She can stop Crestor.  Additional Follow-up by: Verne Carrow, MD,  July 09, 2009 2:37 PM    Additional Follow-up for Phone Call Additional follow up Details #2::    Pt. notified. [facility]  [address] [facility] [place]. Pt will keep scheduled lab appt in September to check lipid and liver Follow-up by: Dossie Arbour, RN, BSN,  July 09, 2009 5:18 PM  New/Updated Medications: SIMVASTATIN 20 MG TABS (SIMVASTATIN) Take one tablet by mouth daily at bedtime Prescriptions: SIMVASTATIN 20 MG TABS (SIMVASTATIN) Take one tablet by mouth daily at bedtime  #30 x 11   Entered by:   Dossie Arbour, RN, BSN   Authorized by:   Verne Carrow, MD   Signed by:   Dossie Arbour, RN, BSN on 07/09/2009   Method used:   Electronically to        [id number]       [address]       [place], [place]  [address]       Ph: [phone]       Fax: [phone]   RxID:   [id number]

## 2010-02-06 NOTE — Assessment & Plan Note (Signed)
Summary: CHEST PAIN/NT   Vital Signs:  Patient profile:   70 year old female Weight:      229.50 pounds Temp:     97.7 degrees F oral Pulse rate:   72 / minute Pulse rhythm:   regular BP sitting:   124 / 74  (left arm) Cuff size:   large  Vitals Entered By: Sydell Axon LPN (May 08, 1608 11:37 AM) CC: Has had some chest pain and palpitations off and on since Friday   History of Present Illness: Pt here for abnormal heartbeats. Mon was the worst when she had very irregular heartbeat with clamminess, nausea  and SOB and inability to get good breath which lasted most of the morning. She then had no additional  problem until last night with same sxs for only a few minutes.  She has had this previously but fleetingly and never as global.  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) 4)  Essential Hypertension  (ICD-401.9) 5)  Gait Disturbance  (ICD-781.2) 6)  Hyperglycemia  (ICD-790.29) 7)  Unspecified Vitamin D Deficiency  (ICD-268.9) 8)  Sciatica, Left  (ICD-724.3) 9)  Special Screening Malig Neoplasms Other Sites  (ICD-V76.49) 10)  Health Maintenance Exam  (ICD-V70.0) 11)  Pure Hypercholesterolemia  (ICD-272.0) 12)  Hypertension, Malignant Essential  (ICD-401.0) 13)  Encounter For Therapeutic Drug Monitoring  (ICD-V58.83) 14)  Aftercare, Long-term Use, Anticoagulants  (ICD-V58.61) 15)  Fibromyalgia  (ICD-729.1) 16)  Interstitial Cystitis (TANNENBAUM)  (ICD-595.1) 17)  Carotid Artery Occlusion L Common  (ICD-433.10) 18)  Detached Retina  (ICD-361.9) 19)  Menopausal Syndrome  (ICD-627.2) 20)  Common Migraine  (ICD-346.10)  Medications Prior to Update: 1)  Hyzaar 100-25 Mg Tabs (Losartan Potassium-Hctz) .... One Tab By Mouth Once Daily 2)  Synthroid 100 Mcg Tabs (Levothyroxine Sodium) .Marland Kitchen.. 1 Daily By Mouth 3)  Verelan 360 Mg Xr24h-Cap (Verapamil Hcl) .Marland Kitchen.. 1 By Mouth  Daily 4)  Buffered Aspirin 325 Mg Tabs (Aspirin Buf(Cacarb-Mgcarb-Mgo)) .Marland Kitchen.. 1 Daily By Mouth 5)  Plavix 75 Mg Tabs (Clopidogrel Bisulfate) .... Take One By Mouth Daily 6)  Fluconazole 150 Mg Tabs (Fluconazole) .... One Tab By Mouth Once Daily 7)  Pyridium 200 Mg Tabs (Phenazopyridine Hcl) .... One Tab By Mouth Three Times A Day As Needed For Burning  Allergies: 1)  ! Codeine 2)  ! * Elmiron 3)  ! Macrobid 4)  ! * Norflex  Physical Exam  General:  alert, well-developed, well-nourished, and well-hydrated.  Obese. Interactive and nontoxic. Head:  normocephalic and atraumatic.  Eyes:  Conjunctiva clear bilaterally. Eyes still sparkle. Ears:  External ear exam shows no significant lesions or deformities.  Otoscopic examination reveals clear canals, tympanic membranes are intact bilaterally without bulging, retraction, inflammation or discharge. Hearing is grossly normal bilaterally.  Nose:  External nasal examination shows no deformity. Nasal mucosa are pink and moist without lesions or exudates. Mouth:  Oral mucosa and oropharynx without lesions or exudates.  Neck:  No deformities, masses, or tenderness noted. Lungs:  Normal respiratory effort, chest expands symmetrically. Lungs are clear to auscultation, no crackles or wheezes. Heart:  Normal rate and regular rhythm. S1 and S2 normal without gallop, murmur, click, rub or other extra sounds. Very Nml Sinus Rhythm.   Impression & Recommendations:  Problem # 1:  CHEST PAIN, ARRYTHMIA, SOB, DIAPHORESIS (ICD-786.50) Assessment New  Based on other vascular disease, cerebral and carotid, I think  she needs to be checked. The worry about these episodes for which I am seeing her today, in my mind, is Afib causing everything else, or some other episodic arrythmia. She is definitely NSR now when seen but needs further investigaion. Probably time for her vessels to be checked as well. Refer to Cardiology. EKG today changed with V in V2. O/W  nonspecific changes.  Orders: Cardiology Referral (Cardiology)  Complete Medication List: 1)  Hyzaar 100-25 Mg Tabs (Losartan potassium-hctz) .... One tab by mouth once daily 2)  Synthroid 100 Mcg Tabs (Levothyroxine sodium) .Marland Kitchen.. 1 daily by mouth 3)  Verelan 360 Mg Xr24h-cap (Verapamil hcl) .Marland Kitchen.. 1 by mouth daily 4)  Buffered Aspirin 325 Mg Tabs (Aspirin buf(cacarb-mgcarb-mgo)) .Marland Kitchen.. 1 daily by mouth 5)  Plavix 75 Mg Tabs (Clopidogrel bisulfate) .... Take one by mouth daily 6)  Pyridium 200 Mg Tabs (Phenazopyridine hcl) .... One tab by mouth three times a day as needed for burning  Other Orders: EKG w/ Interpretation (93[address])  Patient Instructions: 1)  Refer to Cardiology, Dr Clifton James  Current Allergies (reviewed today): ! CODEINE ! * [facility] ! MACROBID ! * NORFLEX

## 2010-02-06 NOTE — Assessment & Plan Note (Signed)
Summary: np6/chest pain/arrythmia/sob/pt request dr Clifton James   Visit Type:  Initial Consult Primary Provider:  Shaune Leeks MD  CC:  chest pain, sob, and left ankle and foot swells.  History of Present Illness: 70 yo WF with h/o HTN, hyperlipidemia, hypothyroidism and basilar artery stenosis here today for new pt evaluation. She describes chest pain and dizziness with walking as well as dyspnea over the last year but worsened in the last two months. The chest pain is substernal and feels like a cramp in the neck with pain radiating to her back. She quickly becomes dyspneic. This resolves with rest. No syncope but she has felt dizzy. She has passed out once in bed. She has also noticed "skipped heartbeats" once per month. This does not happen every day. She had a normal cardiac cath in 1993. She has recently been undergoing cerebral angiograms for basilar arteyr stenosis and has had angiplasty of the basilar artery in August 2010. Otherwise, notes bilateral lower ext edema. Echo July 2010 with mild LVH, normal LV systolic function, EF of 60%, no valvular issues.  She is known to have an occluded left common carotid artery.   Extensive old record review. LDL 141 June 2010.   Current Medications (verified): 1)  Hyzaar 100-25 Mg Tabs (Losartan Potassium-Hctz) .... One Tab By Mouth Once Daily 2)  Synthroid 100 Mcg Tabs (Levothyroxine Sodium) .Marland Kitchen.. 1 Daily By Mouth 3)  Verelan 360 Mg Xr24h-Cap (Verapamil Hcl) .Marland Kitchen.. 1 By Mouth Daily 4)  Buffered Aspirin 325 Mg Tabs (Aspirin Buf(Cacarb-Mgcarb-Mgo)) .Marland Kitchen.. 1 Daily By Mouth 5)  Plavix 75 Mg Tabs (Clopidogrel Bisulfate) .... Take One By Mouth Daily  Allergies: 1)  ! Codeine 2)  ! * Elmiron 3)  ! Macrobid 4)  ! * Norflex  Past History:  Past Medical History: Hypothyroidism (1990) Hypertension, Labile (08/1991) Hyperlipidemia (01/1997) Fibromyalgia Baldder spasms Basilar artery stenosis (90%)  s/p angioplasty August 2010, f/u angiogram March  2011 with residual 40% stenosis  Past Surgical History: Reviewed history from 09/12/2008 and no changes required. BLADDER SUSP  1974 TAH DUE DYSMENNORHEA 1974 CHOLEYCYSTECTOMY  1985 CATH NML  1993 RENAL ANGIO NML  1993 CAROTID U/S  OCCL L COMM CAROTID 11/1991 VISUAL EVOKED RESPONSE NML  11/1991 RENAL U/S NML 12/20/1991 EMG LE, RUE   NML  01/1999 VISUAL EVOKED  RESPONSE NML   01/1999 CAROTID U/S L COMM OCCL  R OK 04/1996 CAROTID U/S L OCCL R LESS 40%  01/30/2003 DEXA TROCH -0.4  O/W POS  10/15/2004 BILAT CAROTID ATRERIOG, BILAT VERT & SUBCLAVIAN ANGIOGR  85-90% STENOSIS MID BASILAR ART            OCCLUDED L VERT ARTERY      OCCLUDED L COMM CAROTID ARTERY W/ COLLAT FLOW  07/19/2008 HOSP S/P BASILAR ARTERY ANGIOPLASTY SM R BRACHIAL HEMATOMA 8/31-09/06/2008 BASILAR ARTERY ANGIOPLASTY (DR DEVESCHWAR) 09/04/2008  Family History: FATHER 12/15/2052LUNG CA MOTHER 2073/12/15deceased MI . 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  The patient denies malaise, fever, weight gain/loss, vision loss, decreased hearing, hoarseness, prolonged cough, wheezing, sleep apnea, coughing up blood, abdominal pain, blood in stool, nausea, vomiting, diarrhea, heartburn, incontinence, blood in urine, muscle weakness,  joint pain, rash, skin lesions, headache, fainting, depression, anxiety, enlarged lymph nodes, easy bruising or bleeding, and environmental allergies.    Vital Signs:  Patient profile:   70 year old female Height:      60 inches Weight:      230 pounds BMI:     45.08 Pulse rate:   72 / minute Pulse rhythm:   regular BP sitting:   130 / 80  (left arm)  Vitals Entered By: Jacquelin Hawking, CMA  (June 10, 2009 10:23 AM)  Physical Exam  General:  General: Well developed, well nourished, NAD HEENT: OP clear, mucus membranes moist SKIN: warm, dry Neuro: No focal deficits Musculoskeletal: Muscle strength 5/5 all ext Psychiatric: Mood and affect normal Neck: No JVD, no carotid bruits, no thyromegaly, no lymphadenopathy. Lungs:Clear bilaterally, no wheezes, rhonci, crackles CV: RRR no murmurs, gallops rubs Abdomen: soft, NT, ND, BS present Extremities:Trace bilateral lower ext  edema, pulses 2+.    Echocardiogram  Procedure date:  07/20/2008  Findings:      Left ventricle: The cavity size was normal. Wall thickness was     increased in a pattern of mild LVH. The estimated ejection fraction     was 60%. Regional wall motion abnormalities cannot be excluded.  Impression & Recommendations:  Problem # 1:  CHEST PAIN-PRECORDIAL (ICD-786.51)  She has multiple risk factors for CAD including HTN, hyperlipidemia, obesity and sedentary lifestyle with FH of CAD. I have discussed cath vs stress testing. She does not wish to pursue stress testing as she wants a definitive diagnosis. I think this is reasonable. Her symptoms are concerning for angina and she could not exercise for the stress test. Given her body habitus, stress echo would not be favorable and stress nuclear study may be inadequate given her size. Risks/benefits of cath reviewed. She wishes to proceed. Will arrange for June 9th in outpatient cath lab at [facility]. Will check BMET, CBC and coags today as well as pre-procedure CXR. Continue current meds.   Her updated medication list for this problem includes:    Verelan 360 Mg Xr24h-cap (Verapamil hcl) .Marland Kitchen... 1 by mouth daily    Buffered Aspirin 325 Mg Tabs (Aspirin buf(cacarb-mgcarb-mgo)) .Marland Kitchen... 1 daily by mouth    Plavix 75 Mg Tabs (Clopidogrel bisulfate) .Marland Kitchen... Take one by mouth daily  Orders: Cardiac Catheterization (Cardiac Cath) TLB-BMP (Basic Metabolic Panel-BMET)  (80048-METABOL) TLB-CBC Platelet - w/Differential (85025-CBCD) TLB-PT (Protime) (85610-PTP)  Problem # 2:  DYSPNEA (ICD-786.05) Related to cardiac until proven otherwise. Will be able to assess LVEDP during cath. May be related to hypertensive heart disease.   Her updated medication list for this problem includes:    Hyzaar 100-25 Mg Tabs (Losartan potassium-hctz) ..... One tab by mouth once daily    Verelan 360 Mg Xr24h-cap (Verapamil hcl) .Marland Kitchen... 1 by mouth daily    Buffered Aspirin 325 Mg Tabs (Aspirin buf(cacarb-mgcarb-mgo)) .Marland Kitchen... 1 daily by mouth  Problem # 3:  ESSENTIAL HYPERTENSION (ICD-401.9)  Well controlled today.  No change in medications.   Her updated medication list for this problem includes:    Hyzaar 100-25 Mg Tabs (Losartan potassium-hctz) ..... One tab by mouth once daily    Verelan 360 Mg Xr24h-cap (Verapamil hcl) .Marland Kitchen... 1 by mouth daily    Buffered Aspirin 325 Mg Tabs (Aspirin buf(cacarb-mgcarb-mgo)) .Marland Kitchen... 1 daily by mouth  Her updated medication list for this problem includes:    Hyzaar 100-25 Mg Tabs (Losartan potassium-hctz) ..... One tab by mouth once daily  Verelan 360 Mg Xr24h-cap (Verapamil hcl) .Marland Kitchen... 1 by mouth daily    Buffered Aspirin 325 Mg Tabs (Aspirin buf(cacarb-mgcarb-mgo)) .Marland Kitchen... 1 daily by mouth  Problem # 4:  PVD (ICD-443.9) Occluded left common carotid artery. She needs yearly carotid dopplers.   Problem # 5:  PURE HYPERCHOLESTEROLEMIA (ICD-272.0) LDL 141 in June 2010. She will need repeat labs. Would benefit from statin.   Other Orders: EKG w/ Interpretation (93000) T-2 View CXR (71020TC)  Patient Instructions: 1)  Your physician recommends that you schedule a follow-up appointment in: 2-3 weeks 2)  Your physician has requested that you have a cardiac catheterization.  Cardiac catheterization is used to diagnose and/or treat various heart conditions. Doctors may recommend this procedure for a number of different reasons. The most common  reason is to evaluate chest pain. Chest pain can be a symptom of coronary artery disease (CAD), and cardiac catheterization can show whether plaque is narrowing or blocking your heart's arteries. This procedure is also used to evaluate the valves, as well as measure the blood flow and oxygen levels in different parts of your heart.  For further information please visit [email or web address]  Please follow instruction sheet, as given.

## 2010-02-06 NOTE — Cardiovascular Report (Signed)
Summary: Pre-Cath Orders  Pre-Cath Orders   Imported By: Debby Freiberg 06/28/2009 15:11:10  _____________________________________________________________________  External Attachment:    Type:   Image     Comment:   External Document

## 2010-02-06 NOTE — Progress Notes (Signed)
Summary: palpitations  Phone Note Call from Patient   Summary of Call: Kelsey Dunn called asking what she should do ref: having palpitations, SOB, being sweaty. She is a patient of Dr. Laurita Quint and wanted to know if she should call his office.  It was strongly suggested that she call 911 due to her symptoms, but she seemed hesitant, so I advised she could call Dr. Lorenza Chick office, but still advised 911. Stanton Kidney, EMT-P  May 06, 2009 8:44 AM

## 2010-02-06 NOTE — Progress Notes (Signed)
Summary: Chest discomfort/missed appt  Phone Note Call from Patient   Caller: Patient Call For: Shaune Leeks MD Summary of Call: Patient came into the office today late.  She had an appt with Dr. Hetty Ely at 12:30 and showed up at 2:30.  She said that the receptionist told her the appt was at 2:30, and she verified the time several times.  She said that she had an episode of her chest feeling weird on Saturday.  No chest pain but discomfort.  Heart was racing, and some SOB.  Today she says she feels ok and has not had another epidsode since yesterday.  Blood pressure today was 140/80 right arm, pulse 64, SPO2 93%.  Rescheduled patient to see Dr. Hetty Ely for 05/08/2009 at 11:45.   Initial call taken by: Linde Gillis CMA [name] Dull),  May 07, 2009 2:41 PM  Follow-up for Phone Call        Thank you. Follow-up by: Shaune Leeks MD,  May 07, 2009 3:53 PM

## 2010-02-06 NOTE — Progress Notes (Signed)
Summary: sided effect from simvastin . c/o pain in legs, hip  Phone Note Call from Patient Call back at Home Phone [phone]   Caller: Patient Reason for Call: Talk to Nurse Summary of Call: side effect from SIMVASTATIN 20 MG TABS Take one tablet by mouth daily at bedtime. c/o  pain in legs, hip,  Initial call taken by: Lorne Skeens,  July 18, 2009 9:20 AM  Follow-up for Phone Call        Mrs. Dupin calls today with c/o soreness in her legs and hips after walking. She states she also has Fibromyalgia and hip problems. She wanted to know if she should decrease her Simvastatin.  I told it would  be fine to cut the Simvastatin to 10mg  at bedtime and to call the office back if she continued to have soreness.  She will try this and also keep her lab appt. Lisabeth Devoid RN

## 2010-02-06 NOTE — Letter (Signed)
Summary: Nadara Eaton letter  [place] [facility]  [address] [place], [place] [address]   Phone: [phone]  Fax: [phone]       08/13/2009 MRN: [id number]  MAKARIA POARCH [address] [place], [place]  [address]  Dear Ms. Delf[facility] [place], and [facility] announce the retirement of Arta Silence, M.D., from full-time practice at the [facility] office effective July 04, 2009 and his plans of returning part-time.  It is important to Dr. Hetty Ely and to our practice that you understand that [facility] has seven physicians in our office for your health care needs.  We will continue to offer the same exceptional care that you have today.    Dr. Hetty Ely has spoken to many of you about his plans for retirement and returning part-time in the fall.   We will continue to work with you through the transition to schedule appointments for you in the office and meet the high standards that [facility] is committed to.   Again, it is with great pleasure that we share the news that Dr. Hetty Ely will return to [facility] in October of 2011 with a reduced schedule.    If you have any questions, or would like to request an appointment with one of our physicians, please call us at [phone] and press the option for Scheduling an appointment.  We take pleasure in providing you with excellent patient care and look forward to seeing you at your next office visit.  Our [facility] Physicians are:  Tillman Abide, M.D. Laurita Quint, M.D. Roxy Manns, M.D. Kerby Nora, M.D. Hannah Beat, M.D. Ruthe Mannan, M.D. We proudly welcomed Raechel Ache, M.D. and Eustaquio Boyden, M.D. to the practice in July/August 2011.  Sincerely,  [facility]

## 2010-02-06 NOTE — Progress Notes (Signed)
Summary: pt requests diflucan  Phone Note From Pharmacy Message from:  Fax from Pharmacy  [id number] [phone] Summary of Call: Refill request for diflucan.  I spoke with pt, she says she has a yeast infection and otc's arent helping.  She will come in to see you next week if she is not better. Initial call taken by: Lowella Petties CMA,  February 28, 2009 12:04 PM    New/Updated Medications: FLUCONAZOLE 150 MG TABS (FLUCONAZOLE) one tab by mouth once daily Prescriptions: FLUCONAZOLE 150 MG TABS (FLUCONAZOLE) one tab by mouth once daily  #1 x 0   Entered and Authorized by:   Shaune Leeks MD   Signed by:   Shaune Leeks MD on 02/28/2009   Method used:   [facility]ically to        [id number]       [address]       [place], [place]  [address]       Ph: [phone]       Fax: [phone]   RxID:   [id number]

## 2010-02-06 NOTE — Assessment & Plan Note (Signed)
Summary: 6 month follow up   Visit Type:  6 month follow up Primary Provider:  Shaune Leeks MD  CC:  None.  History of Present Illness: 70 yo WF with h/o CAD with DES RCA in June 2011, HTN, hyperlipidemia, hypothyroidism and basilar artery stenosis here today for follow up.  Left heart cath on 06/13/09 and she was found to have a severe stenosis in the RCA. There was minor disease in the LAD and Circumflex.  She was brought back in for PCI on 06/19/09 and I placed a single DES in the RCA. She has done well. She has had no chest pain. She has had improvement in her dyspnea. She has lost 12 pounds over the last six months.  She  has been walking every day. She has been feeling well. She has recently been undergoing cerebral angiograms for basilar artery stenosis and has had angiplasty of the basilar artery in August 2010. Echo July 2010 with mild LVH, normal LV systolic function, EF of 60%, no valvular issues.  She is known to have an occluded left common carotid artery.   She stopped her Simvastatin due to left leg pain. She is known to have good lipid profiles but has CAD. She also has left hip pain. This is not changed off of Simvastatin. No chest pain or SOB. She is exercising every day.     Current Medications (verified): 1)  Hyzaar 100-25 Mg Tabs (Losartan Potassium-Hctz) .... One Tab By Mouth Once Daily 2)  Synthroid 100 Mcg Tabs (Levothyroxine Sodium) .Marland Kitchen.. 1 Daily By Mouth 3)  Carvedilol 6.25 Mg Tabs (Carvedilol) .... Take One Tablet By Mouth Twice A Day 4)  Buffered Aspirin 325 Mg Tabs (Aspirin Buf(Cacarb-Mgcarb-Mgo)) .Marland Kitchen.. 1 Daily By Mouth 5)  Plavix 75 Mg Tabs (Clopidogrel Bisulfate) .... Take One By Mouth Daily 6)  Nitrostat 0.4 Mg Subl (Nitroglycerin) .Marland Kitchen.. 1 Tablet Under Tongue At Onset of Chest Pain; You May Repeat Every 5 Minutes For Up To 3 Doses.  Allergies (verified): 1)  ! Codeine 2)  ! * Elmiron 3)  ! Macrobid 4)  ! * Norflex  Past History:  Past Medical  History: Reviewed history from 07/04/2009 and no changes required. Hypothyroidism (1990) Hypertension, Labile (08/1991) Hyperlipidemia (01/1997) Fibromyalgia Baldder spasms Basilar artery stenosis (90%)  s/p angioplasty August 2010, f/u angiogram March 2011 with residual 40% stenosis CAD-severe stenosis RCA with placement drug eluting stent 06/19/09  Social History: Reviewed history from 06/10/2009 and no changes required. 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  The patient denies fatigue, malaise, fever, weight gain/loss, vision loss, decreased hearing, hoarseness, chest pain, palpitations, shortness of breath, prolonged cough, wheezing, sleep apnea, coughing up blood, abdominal pain, blood in stool, nausea, vomiting, diarrhea, heartburn, incontinence, blood in urine, muscle weakness, leg swelling, rash, skin lesions, headache, fainting, dizziness, depression, anxiety, enlarged lymph nodes, easy bruising or bleeding, and environmental allergies.    Vital Signs:  Patient profile:   70 year old female Height:      60 inches Weight:      221.25 pounds BMI:     43.37 Pulse rate:   68 / minute BP sitting:   140 / 90  (left arm) Cuff size:   regular  Vitals Entered By: Caralee Ates CMA (January 23, 2010 3:09 PM)  Physical Exam  General:  General: Well developed, well nourished, NAD Musculoskeletal: Muscle strength 5/5 all ext Psychiatric: Mood and affect normal Neck: No JVD, no carotid bruits, no thyromegaly, no lymphadenopathy. Lungs:Clear bilaterally, no wheezes, rhonci, crackles CV: RRR no murmurs, gallops rubs Abdomen: soft, NT, ND, BS present Extremities: No edema, pulses 2+.    Impression & Recommendations:  Problem # 1:  CAD, NATIVE VESSEL (ICD-414.01) Stable. Continue dual antiplatelet therapy with  ASA and Plavix. Continue beta blocker. She has stopped her statin and wishes to remain off of a statin for now.   Her updated medication list for this problem includes:    Carvedilol 6.25 Mg Tabs (Carvedilol) .Marland Kitchen... Take one tablet by mouth twice a day    Buffered Aspirin 325 Mg Tabs (Aspirin buf(cacarb-mgcarb-mgo)) .Marland Kitchen... 1 daily by mouth    Plavix 75 Mg Tabs (Clopidogrel bisulfate) .Marland Kitchen... Take one by mouth daily    Nitrostat 0.4 Mg Subl (Nitroglycerin) .Marland Kitchen... 1 tablet under tongue at onset of chest pain; you may repeat every 5 minutes for up to 3 doses.  Problem # 2:  ESSENTIAL HYPERTENSION (ICD-401.9) Stable. NO changes  Her updated medication list for this problem includes:    Hyzaar 100-25 Mg Tabs (Losartan potassium-hctz) ..... One tab by mouth once daily    Carvedilol 6.25 Mg Tabs (Carvedilol) .Marland Kitchen... Take one tablet by mouth twice a day    Buffered Aspirin 325 Mg Tabs (Aspirin buf(cacarb-mgcarb-mgo)) .Marland Kitchen... 1 daily by mouth  Patient Instructions: 1)  Your physician recommends that you continue on your current medications as directed. Please refer to the Current Medication list given to you today. 2)  Your physician wants you to follow-up in: 6 MONTHS.   You will receive a reminder letter in the mail two months in advance. If you don't receive a letter, please call our office to schedule the follow-up appointment.

## 2010-02-06 NOTE — Letter (Signed)
Summary: Cardiac Catheterization Instructions- [facility]  [facility], Main Office  [address]   [place], [place] [address]   Phone: [phone]  Fax: [phone]     06/10/2009 MRN: [id number]  Kelsey Dunn [address] [place], [place]  [address]  Dear [name],   You are scheduled for a Cardiac Catheterization on June 13, 2009 with Dr.McAlhany  Please arrive to the 1st floor of the Heart and Vascular Center at [facility] at 9:30 am  on the day of your procedure. Please do not arrive before 6:30 a.m. Call the Heart and Vascular Center at [phone] if you are unable to make your appointmnet. The Code to get into the parking garage under the building is 0100. Take the elevators to the 1st floor. You must have someone to drive you home. Someone must be with you for the first 24 hours after you arrive home. Please wear clothes that are easy to get on and off and wear slip-on shoes. Do not eat or drink after midnight except water with your medications that morning. Bring all your medications and current insurance cards with you.   _X__ Make sure you take your aspirin.  _X__ You may take ALL of your medications with water that morning. ________________________________________________________________________________________________________________________________  The usual length of stay after your procedure is 2 to 3 hours. This can vary.  If you have any questions, please call the office at the number listed above.   Dossie Arbour, RN, BSN

## 2010-02-06 NOTE — Assessment & Plan Note (Signed)
Summary: 3wk f/u sl   Visit Type:  3 wk f/u Primary Provider:  Shaune Leeks MD  CC:  edema/left ankle/foot.  History of Present Illness: 70 yo WF with h/o HTN, hyperlipidemia, hypothyroidism and basilar artery stenosis here today for follow up. She was seen as a new patient three weeks ago at which time she described chest pain and dizziness with walking as well as dyspnea over the last year but worsened in the last two months. The chest pain is substernal and feels like a cramp in the neck with pain radiating to her back. She quickly becomes dyspneic. This resolves with rest. No syncope but she has felt dizzy. She has passed out once in bed. She has also noticed "skipped heartbeats" once per month. This does not happen every day. She had a normal cardiac cath in 1993. She has recently been undergoing cerebral angiograms for basilar artery stenosis and has had angiplasty of the basilar artery in August 2010. Otherwise, notes bilateral lower ext edema. Echo July 2010 with mild LVH, normal LV systolic function, EF of 60%, no valvular issues.  She is known to have an occluded left common carotid artery.   I arranged a left heart cath on 06/13/09 and she was found to have a severe stenosis in the RCA. There was minor disease in the LAD and Circumflex.  She was brought back in for PCI on 06/19/09 and I placed a single DES in the RCA. She has done well. She has had no chest pain. She has had improvement in her dyspnea. She has lost 4 pounds over the last few weeks and has been walking every day. She has been feeling well.    Current Medications (verified): 1)  Hyzaar 100-25 Mg Tabs (Losartan Potassium-Hctz) .... One Tab By Mouth Once Daily 2)  Synthroid 100 Mcg Tabs (Levothyroxine Sodium) .Marland Kitchen.. 1 Daily By Mouth 3)  Verelan 360 Mg Xr24h-Cap (Verapamil Hcl) .Marland Kitchen.. 1 By Mouth Daily 4)  Buffered Aspirin 325 Mg Tabs (Aspirin Buf(Cacarb-Mgcarb-Mgo)) .Marland Kitchen.. 1 Daily By Mouth 5)  Plavix 75 Mg Tabs (Clopidogrel  Bisulfate) .... Take One By Mouth Daily 6)  Nitrostat 0.4 Mg Subl (Nitroglycerin) .Marland Kitchen.. 1 Tablet Under Tongue At Onset of Chest Pain; You May Repeat Every 5 Minutes For Up To 3 Doses.  Allergies: 1)  ! Codeine 2)  ! * Elmiron 3)  ! Macrobid 4)  ! * Norflex  Past History:  Past Medical History: Hypothyroidism (1990) Hypertension, Labile (08/1991) Hyperlipidemia (01/1997) Fibromyalgia Baldder spasms Basilar artery stenosis (90%)  s/p angioplasty August 2010, f/u angiogram March 2011 with residual 40% stenosis CAD-severe stenosis RCA with placement drug eluting stent 06/19/09  Social History: Reviewed history from 06/10/2009 and no changes required. 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  The patient denies fatigue, malaise, fever, weight gain/loss, vision loss, decreased hearing, hoarseness, chest pain, palpitations, prolonged cough, wheezing, sleep apnea, coughing up blood, abdominal pain, blood in stool, nausea, vomiting, diarrhea, heartburn, incontinence, blood in urine, muscle weakness, joint pain, leg swelling, rash, skin lesions, headache, fainting, dizziness, depression, anxiety, enlarged lymph nodes, easy bruising or bleeding, and environmental allergies.    Vital Signs:  Patient profile:   70 year old female Height:      60 inches Weight:      226 pounds BMI:  44.30 Pulse rate:   76 / minute Pulse rhythm:   regular BP sitting:   120 / 70  (left arm) Cuff size:   large  Vitals Entered By: Danielle Rankin, CMA (July 04, 2009 9:51 AM)  Physical Exam  General:  General: Well developed, well nourished, NAD HEENT: OP clear, mucus membranes moist Psychiatric: Mood and affect normal Neck: No JVD, no carotid bruits, no thyromegaly, no lymphadenopathy. Lungs:Clear bilaterally, no wheezes, rhonci,  crackles CV: RRR no murmurs, gallops rubs Abdomen: soft, NT, ND, BS present Extremities: No edema, pulses 2+.    Cardiac Cath  Procedure date:  06/13/2009  Findings:      ANGIOGRAPHIC FINDINGS: 1. The left main coronary artery had no evidence of disease. 2. The left anterior descending was a large vessel that coursed to the     apex and gave off a small diagonal branch.  The LAD had minor     luminal irregularities. 3. The circumflex artery was comprised mainly of a moderate-sized     obtuse marginal branch that bifurcated and had no disease.  There     was a moderate size ramus intermediate branch that had no disease. 4. The right coronary artery was a large dominant vessel that had a     70% tubular stenosis in the midportion of the vessel. 5. Left ventricular angiogram was performed in the RAO projection and     it showed normal left ventricular systolic function with ejection     fraction of 65-70%.  Cardiac Cath  Procedure date:  06/19/2009  Findings:      A 2.5 x 12-mm balloon was inflated inside the stenosis.  A 3.0 x 15-mm PROMUS drug-eluting stent was deployed in the area of tightest stenosis.  A 3.25 x 12-mm noncompliant balloon was inflated inside the stent.  The stenosis was taken from 70% down to 0%.  There was an excellent angiographic result. The patient tolerated the procedure well and was taken to the holding area in stable condition.   IMPRESSION:  Successful percutaneous coronary intervention with placement of a single drug-eluting stent in the mid right coronary artery.  Impression & Recommendations:  Problem # 1:  CAD, NATIVE VESSEL (ICD-414.01) Doing well post stent placement two weeks ago. I will d/c her Verapamil and start Coreg 6. 25 mg by mouth two times a day. I will start Crestor 10 mg by mouth Qdaily. Continue ASA and Plavix for at least one year.  Check fasting lipids, LFTS in 12 weeks.  Goal weight of 215 pounds at next visit.   Her  updated medication list for this problem includes:    Carvedilol 6.25 Mg Tabs (Carvedilol) .Marland Kitchen... Take one tablet by mouth twice a day    Buffered Aspirin 325 Mg Tabs (Aspirin buf(cacarb-mgcarb-mgo)) .Marland Kitchen... 1 daily by mouth    Plavix 75 Mg Tabs (Clopidogrel bisulfate) .Marland Kitchen... Take one by mouth daily    Nitrostat 0.4 Mg Subl (Nitroglycerin) .Marland Kitchen... 1 tablet under tongue at onset of chest pain; you may repeat every 5 minutes for up to 3 doses.  Patient Instructions: 1)  Your physician recommends that you schedule a follow-up appointment in: 6 months 2)  Your physician has recommended you make the following change in your medication: Stop verapamil.Start carvedilol 6.25 mg by mouth two times a day 3)  Start Crestor 10 mg by mouth daily. 4)  Your physician recommends that you return for a FASTING lipid profile and liver profile in 12 weeks--272.0 Prescriptions: CARVEDILOL 6.25  MG TABS (CARVEDILOL) Take one tablet by mouth twice a day  #60 x 11   Entered by:   Dossie Arbour, RN, BSN   Authorized by:   Verne Carrow, MD   Signed by:   Dossie Arbour, RN, BSN on 07/04/2009   Method used:   Electronically to        [id number] (retail)       [address]       [place], [place]  [address]       Ph: [phone]       Fax: [phone]   RxID:   [id number] NITROSTAT 0.4 MG SUBL (NITROGLYCERIN) 1 tablet under tongue at onset of chest pain; you may repeat every 5 minutes for up to 3 doses.  #25 x 9   Entered by:   Danielle Rankin, CMA   Authorized by:   Verne Carrow, MD   Signed by:   Danielle Rankin, CMA on 07/04/2009   Method used:   Electronically to        [id number] (retail)       [address]       [place], [place]  [address]       Ph: [phone]       Fax: [phone]   RxID:   [id number]

## 2010-02-06 NOTE — Assessment & Plan Note (Signed)
Summary: pain in foot/alc   Vital Signs:  Patient profile:   70 year old female Weight:      231 pounds Temp:     98.3 degrees F oral Pulse rate:   68 / minute Pulse rhythm:   regular BP sitting:   144 / 80  (left arm) Cuff size:   large  Vitals Entered By: Sydell Axon LPN (February 18, 2009 2:17 PM) CC: Pain in left foot   History of Present Illness: Pt here for pain in her left Great toe which starts just distal to MTP joint , has protuberance dorsally on the MTP joint and then along the distribution of the extensor tendons of the gt toe to the ankle. She is occas bothered with pain along the continued distr of the tendon up the distal lower extremity. She does not think she has changesd method of walking, gait, etc. She denies trauma.  Problems Prior to Update: 1)  Uri  (ICD-465.9) 2)  Occlusion&stenos Basilar Art w/o Mention Infarct  (ICD-433.00) 3)  Essential Hypertension  (ICD-401.9) 4)  Gait Disturbance  (ICD-781.2) 5)  Hyperglycemia  (ICD-790.29) 6)  Unspecified Vitamin D Deficiency  (ICD-268.9) 7)  Sciatica, Left  (ICD-724.3) 8)  Special Screening Malig Neoplasms Other Sites  (ICD-V76.49) 9)  Health Maintenance Exam  (ICD-V70.0) 10)  Pure Hypercholesterolemia  (ICD-272.0) 11)  Hypertension, Malignant Essential  (ICD-401.0) 12)  Encounter For Therapeutic Drug Monitoring  (ICD-V58.83) 13)  Aftercare, Long-term Use, Anticoagulants  (ICD-V58.61) 14)  Fibromyalgia  (ICD-729.1) 15)  Interstitial Cystitis (TANNENBAUM)  (ICD-595.1) 16)  Carotid Artery Occlusion L Common  (ICD-433.10) 17)  Detached Retina  (ICD-361.9) 18)  Menopausal Syndrome  (ICD-627.2) 19)  Common Migraine  (ICD-346.10)  Medications Prior to Update: 1)  Hyzaar 100-25 Mg Tabs (Losartan Potassium-Hctz) .... One Tab By Mouth Once Daily 2)  Synthroid 100 Mcg Tabs (Levothyroxine Sodium) .Marland Kitchen.. 1 Daily By Mouth 3)  Verelan 360 Mg Xr24h-Cap (Verapamil Hcl) .Marland Kitchen.. 1 By Mouth Daily 4)  Buffered Aspirin 325 Mg Tabs  (Aspirin Buf(Cacarb-Mgcarb-Mgo)) .Marland Kitchen.. 1 Daily By Mouth 5)  Plavix 75 Mg Tabs (Clopidogrel Bisulfate) .... Take One By Mouth Daily 6)  Zithromax Z-Pak 250 Mg Tabs (Azithromycin) .... As Dir 7)  Tessalon 200 Mg Caps (Benzonatate) .... One Tab By Mouth Three Times A Day As Needed For Cough  Allergies: 1)  ! Codeine 2)  ! * Elmiron 3)  ! Macrobid 4)  ! * Norflex  Physical Exam  General:  alert, well-developed, well-nourished, and well-hydrated.  Obese.  Head:  normocephalic and atraumatic.  Eyes:  Conjunctiva clear bilaterally. Eyes still sparkle. Ears:  External ear exam shows no significant lesions or deformities.  Otoscopic examination reveals clear canals, tympanic membranes are intact bilaterally without bulging, retraction, inflammation or discharge. Hearing is grossly normal bilaterally.  Nose:  External nasal examination shows no deformity. Nasal mucosa are pink and moist without lesions or exudates. Mouth:  Oral mucosa and oropharynx without lesions or exudates.  Neck:  No deformities, masses, or tenderness noted. Chest Wall:  No deformities, masses, or tenderness noted. Lungs:  Normal respiratory effort, chest expands symmetrically. Lungs are clear to auscultation, no crackles or wheezes. Heart:  Normal rate and regular rhythm. S1 and S2 normal without gallop, murmur, click, rub or other extra sounds.   Impression & Recommendations:  Problem # 1:  FOOT PAIN, LEFT (ICD-729.5) Assessment New Along extensor tendon of left gt toe...discussed secreasing inflamm by rest, elevation, ice and heat application. May need injection  tx if doesn't stay off it enough. No NSAIDS due to meds and probs.  Problem # 2:  URI (ICD-465.9) Assessment: Improved Basically resolverd. The following medications were removed from the medication list:    Tessalon 200 Mg Caps (Benzonatate) ..... One tab by mouth three times a day as needed for cough Her updated medication list for this problem includes:     Buffered Aspirin 325 Mg Tabs (Aspirin buf(cacarb-mgcarb-mgo)) .Marland Kitchen... 1 daily by mouth  Complete Medication List: 1)  Hyzaar 100-25 Mg Tabs (Losartan potassium-hctz) .... One tab by mouth once daily 2)  Synthroid 100 Mcg Tabs (Levothyroxine sodium) .Marland Kitchen.. 1 daily by mouth 3)  Verelan 360 Mg Xr24h-cap (Verapamil hcl) .Marland Kitchen.. 1 by mouth daily 4)  Buffered Aspirin 325 Mg Tabs (Aspirin buf(cacarb-mgcarb-mgo)) .Marland Kitchen.. 1 daily by mouth 5)  Plavix 75 Mg Tabs (Clopidogrel bisulfate) .... Take one by mouth daily  Patient Instructions: 1)  Discussed heat and ice to toes and foot. Also use topicals like Icy Hot and avoid walking for a week or so, if possible. 2)  Call if no impr for podiatry referral.  Current Allergies (reviewed today): ! CODEINE ! * ELMIRON ! MACROBID ! * NORFLEX

## 2010-02-06 NOTE — Progress Notes (Signed)
Summary: re palpitations  Phone Note Call from Patient Call back at Work Phone [phone]   Caller: Patient Call For: Shaune Leeks MD Summary of Call: Pt has been having palpitations. She had a spell saturday night and another this morning.  She feels ok now.  She doesnt want to go to ER because of the cost.  She made appt for tomorrow but knows to go to the hospital if she has more problems today.  Do you want to work her in today? Initial call taken by: Lowella Petties CMA,  May 06, 2009 8:54 AM  Follow-up for Phone Call        No, what you have done is fine. Follow-up by: Shaune Leeks MD,  May 06, 2009 9:16 AM

## 2010-02-06 NOTE — Assessment & Plan Note (Signed)
Summary: TRANSFER FROM DR SCHALLER/CLE   Vital Signs:  Patient profile:   70 year old female Height:      60 inches Weight:      224.75 pounds BMI:     44.05 Temp:     97.8 degrees F oral Pulse rate:   92 / minute Pulse rhythm:   regular BP sitting:   132 / 80  (left arm) Cuff size:   large  Vitals Entered By: Delilah Shan CMA (AAMA) (August 07, 2009 3:00 PM) CC: Transfer from [facility]   History of Present Illness: H/o known vasc disease.  Planned reeval with angiogram upcoming with Dr. Corliss Skains to eval intracranial vessels.  No vertigo, no CP, no SOB (other than from deconditioning).  Walking daily with treadmill.  Cholesterol followed by cards.    Hypothyroidism- on replacement and no sypmtoms of over/underreplacement.  doing well overall with that.    Prev cortisone injection in L hip for pain.  Now with pain radiating down into L foot.  No pain walking on treadmill but pain when laying on side.  "I can tolerate it for now.  I want to get through everything else and then if it is still bothering me, I'd want to work on it then."  Allergies: 1)  ! Codeine 2)  ! * Elmiron 3)  ! Macrobid 4)  ! * Norflex  Past History:  Past Medical History: Last updated: 07/04/2009 Hypothyroidism (1990) Hypertension, Labile (08/1991) Hyperlipidemia (01/1997) Fibromyalgia Baldder spasms Basilar artery stenosis (90%)  s/p angioplasty August 2010, f/u angiogram March 2011 with residual 40% stenosis CAD-severe stenosis RCA with placement drug eluting stent 06/19/09  Past Surgical History: BLADDER SUSP  1974 TAH DUE DYSMENNORHEA 1974 CHOLEYCYSTECTOMY  1985 CATH NML  1993 RENAL ANGIO NML  1993 CAROTID U/S  OCCL L COMM CAROTID 11/1991 VISUAL EVOKED RESPONSE NML  11/1991 RENAL U/S NML 12-24-1991 EMG LE, RUE   NML  01/1999 VISUAL EVOKED  RESPONSE NML   01/1999 CAROTID U/S L COMM OCCL  R OK 04/1996 CAROTID U/S L OCCL R LESS 40%  01/30/2003 DEXA TROCH -0.4  O/W POS  10/15/2004 BILAT CAROTID  ATRERIOG, BILAT VERT & SUBCLAVIAN ANGIOGR  85-90% STENOSIS MID BASILAR ART            OCCLUDED L VERT ARTERY      OCCLUDED L COMM CAROTID ARTERY W/ COLLAT FLOW  07/19/2008 HOSP S/P BASILAR ARTERY ANGIOPLASTY SM R BRACHIAL HEMATOMA 8/31-09/06/2008 BASILAR ARTERY ANGIOPLASTY (DR DEVESCHWAR) 09/04/2008 CATH SINGLE VESS DZ  70% STENOSIS RCA  EF 65-70% 06/13/2009 HOSP Unsatble Angina  CATH PTCA  STENT  RCA 6/15-6/16/2011  Family History: Reviewed history from 06/10/2009 and no changes required. FATHER [birth year] 19, 2052LUNG CA MOTHER December 19, 2073deceased MI . Also had LYMPHOMA S/P CHEMO  STROKES X 2 BROTHER A 61 DM  Social History: Reviewed history from 06/10/2009 and no changes required. 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  Otherwise negative.    Physical Exam  General:  GEN: nad, alert and oriented HEENT: mucous membranes moist NECK: supple w/o LA CV: rrr.  PULM: ctab, no inc wob ABD: soft, +bs EXT: no edema SKIN: no acute rash    Impression & Recommendations:  Problem # 1:  PVD (ICD-443.9) Defer to interventional radiology.  No change in meds at this point.  >25 min spent  with patient.  Cholesterol followed by cards.  Her updated medication list for this problem includes:    Plavix 75 Mg Tabs (Clopidogrel bisulfate) .Marland Kitchen... Take one by mouth daily  Problem # 2:  UNSPECIFIED HYPOTHYROIDISM (ICD-244.9) Will recheck TSH this fall.  return for labs.   We will address L hip pain later this fall.  Her updated medication list for this problem includes:    Synthroid 100 Mcg Tabs (Levothyroxine sodium) .Marland Kitchen... 1 daily by mouth  Complete Medication List: 1)  Hyzaar 100-25 Mg Tabs (Losartan potassium-hctz) .... One tab by mouth once daily 2)  Synthroid 100 Mcg Tabs (Levothyroxine sodium) .Marland Kitchen.. 1 daily by mouth 3)  Carvedilol 6.25 Mg Tabs (Carvedilol) .... Take  one tablet by mouth twice a day 4)  Buffered Aspirin 325 Mg Tabs (Aspirin buf(cacarb-mgcarb-mgo)) .Marland Kitchen.. 1 daily by mouth 5)  Plavix 75 Mg Tabs (Clopidogrel bisulfate) .... Take one by mouth daily 6)  Nitrostat 0.4 Mg Subl (Nitroglycerin) .Marland Kitchen.. 1 tablet under tongue at onset of chest pain; you may repeat every 5 minutes for up to 3 doses. 7)  Simvastatin 10 Mg Tabs (Simvastatin) .... Take 1 tablet by mouth once a day  Patient Instructions: 1)  244.9 TSH this fall.  2)  Let me know if the pain in your hip gets worse.   Current Allergies (reviewed today): ! CODEINE ! * ELMIRON ! MACROBID ! * NORFLEX

## 2010-02-11 ENCOUNTER — Other Ambulatory Visit (HOSPITAL_COMMUNITY): Payer: Self-pay | Admitting: Interventional Radiology

## 2010-02-11 DIAGNOSIS — I729 Aneurysm of unspecified site: Secondary | ICD-10-CM

## 2010-03-11 ENCOUNTER — Other Ambulatory Visit (HOSPITAL_COMMUNITY): Payer: Self-pay

## 2010-03-13 ENCOUNTER — Ambulatory Visit (HOSPITAL_COMMUNITY)
Admission: RE | Admit: 2010-03-13 | Discharge: 2010-03-13 | Disposition: A | Discharge status: Home / Self Care | Payer: Medicare HMO | Source: Ambulatory Visit | Attending: Interventional Radiology | Admitting: Interventional Radiology

## 2010-03-13 DIAGNOSIS — I729 Aneurysm of unspecified site: Secondary | ICD-10-CM

## 2010-03-13 DIAGNOSIS — I658 Occlusion and stenosis of other precerebral arteries: Secondary | ICD-10-CM | POA: Insufficient documentation

## 2010-03-13 DIAGNOSIS — Z8673 Personal history of transient ischemic attack (TIA), and cerebral infarction without residual deficits: Secondary | ICD-10-CM | POA: Insufficient documentation

## 2010-03-13 DIAGNOSIS — Z09 Encounter for follow-up examination after completed treatment for conditions other than malignant neoplasm: Secondary | ICD-10-CM | POA: Insufficient documentation

## 2010-03-13 LAB — DIFFERENTIAL
Eosinophils Relative: 4 % (ref 0–5)
Lymphocytes Relative: 27 % (ref 12–46)
Lymphs Abs: 2 10*3/uL (ref 0.7–4.0)
Monocytes Absolute: 0.7 10*3/uL (ref 0.1–1.0)
Monocytes Relative: 9 % (ref 3–12)

## 2010-03-13 LAB — CBC
HCT: 44.2 % (ref 36.0–46.0)
MCHC: 34.4 g/dL (ref 30.0–36.0)
MCV: 87.9 fL (ref 78.0–100.0)
RDW: 13.4 % (ref 11.5–15.5)

## 2010-03-13 LAB — BASIC METABOLIC PANEL
CO2: 29 mEq/L (ref 19–32)
Glucose, Bld: 111 mg/dL — ABNORMAL HIGH (ref 70–99)
Potassium: 3.6 mEq/L (ref 3.5–5.1)
Sodium: 139 mEq/L (ref 135–145)

## 2010-03-13 MED ORDER — GADOBENATE DIMEGLUMINE 529 MG/ML IV SOLN
20.0000 mL | Freq: Once | INTRAVENOUS | Status: AC
  Administered 2010-03-13: 20 mL via INTRAVENOUS

## 2010-03-21 LAB — BASIC METABOLIC PANEL
BUN: 17 mg/dL (ref 6–23)
Calcium: 9.8 mg/dL (ref 8.4–10.5)
GFR calc non Af Amer: >60 mL/min (ref >60)
Glucose, Bld: 101 mg/dL — ABNORMAL HIGH (ref 70–99)
Potassium: 4.6 mEq/L (ref 3.5–5.1)

## 2010-03-21 LAB — CBC
HCT: 45.2 % (ref 36.0–46.0)
MCHC: 33.4 g/dL (ref 30.0–36.0)
RDW: 13.4 % (ref 11.5–15.5)

## 2010-03-23 LAB — BASIC METABOLIC PANEL
BUN: 13 mg/dL (ref 6–23)
CO2: 31 mEq/L (ref 19–32)
Calcium: 9 mg/dL (ref 8.4–10.5)
Calcium: 9.2 mg/dL (ref 8.4–10.5)
Creatinine, Ser: 0.95 mg/dL (ref 0.4–1.2)
Creatinine, Ser: 0.97 mg/dL (ref 0.4–1.2)
GFR calc Af Amer: >60 mL/min (ref >60)
GFR calc non Af Amer: 57 mL/min — ABNORMAL LOW (ref >60)
Glucose, Bld: 108 mg/dL — ABNORMAL HIGH (ref 70–99)
Sodium: 140 mEq/L (ref 135–145)

## 2010-03-23 LAB — CBC
MCHC: 34.4 g/dL (ref 30.0–36.0)
Platelets: 220 10*3/uL (ref 150–400)
Platelets: 229 10*3/uL (ref 150–400)
RBC: 4.74 MIL/uL (ref 3.87–5.11)
RDW: 13.8 % (ref 11.5–15.5)
RDW: 14.1 % (ref 11.5–15.5)

## 2010-03-23 LAB — PROTIME-INR
INR: 1.08 (ref 0.00–1.49)
Prothrombin Time: 13.9 seconds (ref 11.6–15.2)

## 2010-03-31 LAB — BASIC METABOLIC PANEL
Calcium: 9.3 mg/dL (ref 8.4–10.5)
GFR calc non Af Amer: 56 mL/min — ABNORMAL LOW (ref >60)
Potassium: 3.9 mEq/L (ref 3.5–5.1)
Sodium: 139 mEq/L (ref 135–145)

## 2010-03-31 LAB — CBC
HCT: 41.7 % (ref 36.0–46.0)
Hemoglobin: 14.3 g/dL (ref 12.0–15.0)
Platelets: 237 10*3/uL (ref 150–400)
WBC: 6.5 10*3/uL (ref 4.0–10.5)

## 2010-03-31 LAB — APTT: aPTT: 27 seconds (ref 24–37)

## 2010-03-31 LAB — PROTIME-INR: INR: 1.07 (ref 0.00–1.49)

## 2010-04-08 LAB — BASIC METABOLIC PANEL
Chloride: 103 mEq/L (ref 96–112)
Creatinine, Ser: 1.06 mg/dL (ref 0.4–1.2)
GFR calc Af Amer: >60 mL/min (ref >60)
GFR calc non Af Amer: 52 mL/min — ABNORMAL LOW (ref >60)
Potassium: 3.6 mEq/L (ref 3.5–5.1)

## 2010-04-08 LAB — PROTIME-INR: Prothrombin Time: 14 seconds (ref 11.6–15.2)

## 2010-04-08 LAB — CBC
MCV: 89 fL (ref 78.0–100.0)
Platelets: 255 10*3/uL (ref 150–400)
RBC: 4.58 MIL/uL (ref 3.87–5.11)
WBC: 5.7 10*3/uL (ref 4.0–10.5)

## 2010-04-08 LAB — APTT: aPTT: 28 seconds (ref 24–37)

## 2010-04-11 LAB — CBC
MCV: 90.4 fL (ref 78.0–100.0)
Platelets: 221 10*3/uL (ref 150–400)
RBC: 3.74 MIL/uL — ABNORMAL LOW (ref 3.87–5.11)
WBC: 6.4 10*3/uL (ref 4.0–10.5)

## 2010-04-11 LAB — HEPARIN LEVEL (UNFRACTIONATED): Heparin Unfractionated: <0.1 IU/mL — ABNORMAL LOW (ref 0.30–0.70)

## 2010-04-11 LAB — BASIC METABOLIC PANEL
BUN: 10 mg/dL (ref 6–23)
Chloride: 108 mEq/L (ref 96–112)
GFR calc Af Amer: >60 mL/min (ref >60)
GFR calc non Af Amer: 58 mL/min — ABNORMAL LOW (ref >60)
Potassium: 3.4 mEq/L — ABNORMAL LOW (ref 3.5–5.1)
Sodium: 141 mEq/L (ref 135–145)

## 2010-04-12 LAB — BASIC METABOLIC PANEL
BUN: 11 mg/dL (ref 6–23)
Chloride: 101 mEq/L (ref 96–112)
Creatinine, Ser: 1.01 mg/dL (ref 0.4–1.2)
GFR calc Af Amer: >60 mL/min (ref >60)
GFR calc non Af Amer: 55 mL/min — ABNORMAL LOW (ref >60)
Potassium: 3.9 mEq/L (ref 3.5–5.1)

## 2010-04-12 LAB — DIFFERENTIAL
Lymphocytes Relative: 30 % (ref 12–46)
Lymphs Abs: 1.9 10*3/uL (ref 0.7–4.0)
Monocytes Relative: 8 % (ref 3–12)
Neutro Abs: 3.6 10*3/uL (ref 1.7–7.7)
Neutrophils Relative %: 58 % (ref 43–77)

## 2010-04-12 LAB — APTT: aPTT: 30 seconds (ref 24–37)

## 2010-04-12 LAB — CBC
MCV: 90.4 fL (ref 78.0–100.0)
Platelets: 261 10*3/uL (ref 150–400)
RBC: 4.63 MIL/uL (ref 3.87–5.11)
WBC: 6.1 10*3/uL (ref 4.0–10.5)

## 2010-04-12 LAB — PROTIME-INR
INR: 1 (ref 0.00–1.49)
Prothrombin Time: 13.4 seconds (ref 11.6–15.2)

## 2010-04-12 LAB — HEPARIN LEVEL (UNFRACTIONATED): Heparin Unfractionated: <0.1 IU/mL — ABNORMAL LOW (ref 0.30–0.70)

## 2010-04-13 LAB — BASIC METABOLIC PANEL
CO2: 30 mEq/L (ref 19–32)
Calcium: 9.7 mg/dL (ref 8.4–10.5)
Creatinine, Ser: 0.93 mg/dL (ref 0.4–1.2)
GFR calc Af Amer: >60 mL/min (ref >60)
GFR calc non Af Amer: >60 mL/min (ref >60)
Sodium: 142 mEq/L (ref 135–145)

## 2010-04-13 LAB — PROTIME-INR
INR: 1.3 (ref 0.00–1.49)
Prothrombin Time: 16.4 seconds — ABNORMAL HIGH (ref 11.6–15.2)

## 2010-04-13 LAB — CBC
Hemoglobin: 15.4 g/dL — ABNORMAL HIGH (ref 12.0–15.0)
MCHC: 34.8 g/dL (ref 30.0–36.0)
RBC: 5 MIL/uL (ref 3.87–5.11)
WBC: 6.4 10*3/uL (ref 4.0–10.5)

## 2010-04-13 LAB — APTT: aPTT: 31 seconds (ref 24–37)

## 2010-04-21 LAB — DIFFERENTIAL
Basophils Absolute: 0 10*3/uL (ref 0.0–0.1)
Lymphocytes Relative: 33 % (ref 12–46)
Lymphs Abs: 2.1 10*3/uL (ref 0.7–4.0)
Monocytes Absolute: 0.5 10*3/uL (ref 0.1–1.0)
Neutro Abs: 3.5 10*3/uL (ref 1.7–7.7)

## 2010-04-21 LAB — BASIC METABOLIC PANEL
Calcium: 9.1 mg/dL (ref 8.4–10.5)
GFR calc non Af Amer: >60 mL/min (ref >60)
Glucose, Bld: 96 mg/dL (ref 70–99)
Sodium: 138 mEq/L (ref 135–145)

## 2010-04-21 LAB — CBC
Hemoglobin: 15.1 g/dL — ABNORMAL HIGH (ref 12.0–15.0)
Platelets: 249 10*3/uL (ref 150–400)
RDW: 13 % (ref 11.5–15.5)
WBC: 6.4 10*3/uL (ref 4.0–10.5)

## 2010-04-21 LAB — URINALYSIS, ROUTINE W REFLEX MICROSCOPIC
Ketones, ur: NEGATIVE mg/dL
Nitrite: NEGATIVE
Protein, ur: NEGATIVE mg/dL

## 2010-04-21 LAB — APTT: aPTT: 34 seconds (ref 24–37)

## 2010-04-21 LAB — PROTIME-INR: Prothrombin Time: 20 seconds — ABNORMAL HIGH (ref 11.6–15.2)

## 2010-05-20 NOTE — Discharge Summary (Signed)
NAMERYIN, SCHILLO                ACCOUNT NO.:  [id number]   MEDICAL RECORD NO.:  [id number]          PATIENT TYPE:  OIB   LOCATION:  3113                         FACILITY:  [facility]   PHYSICIAN:  Sanjeev K. Deveshwar, M.D.DATE OF BIRTH:  [birth year]   DATE OF ADMISSION:  09/04/2008  DATE OF DISCHARGE:  09/06/2008                               DISCHARGE SUMMARY   CHIEF COMPLAINT:  Status post basilar artery angioplasty performed on  September 04, 2008.   BRIEF HISTORY:  This is a pleasant 70 year old female who was referred  to Dr. Corliss Skains through the courtesy of Dr. Laurita Quint for  evaluation of an unsteady gait and poor balance.  The patient awoke on  July 05, 2008 and upon standing developed dizziness, vertigo, and  bilateral loss of vision.  The patient had an MRI/MRA performed on July 11, 2008, that was consistent with a severe basilar artery stenosis.  The  patient was also noted to have reconstituted flow in the left internal  carotid artery in the siphon region.  Major anterior circulation  branches appeared to be well perfused.   The patient was seen in consultation by Dr. Corliss Skains on July 14, 2008.  A cerebral angiogram was performed on July 15.  The angiogram confirmed  an 85-90% stenosis of the mid basilar artery.  She had an  angiographically occluded left vertebral artery at the level of the C1  vertebral body.  The patient also had an angiographically occluded left  common carotid artery just distal to its origin.  There was substantial  opacification of the left anterior and left middle cerebral artery  distributions via the anterior communicating artery from the right  internal carotid artery injection.   The patient was seen once again by Dr. Corliss Skains on July 26, 2008.  At  that time, treatment options were discussed along with the risks and  benefits.  The patient decided to proceed with stent-assisted  angioplasty to the basilar artery stenosis.  She was  admitted to [facility] on September 04, 2008, for that procedure.   PAST MEDICAL HISTORY:  Significant for:  1. Hypothyroidism.  2. Hypertension.  3. Hyperlipidemia.  4. Fibromyalgia.  5. Interstitial cystitis.  6. The patient has had a known occlusion of the left common carotid      artery which occurred approximately 25 years ago.  She had      previously been on Coumadin for this problem.  The Coumadin was      stopped in early July at which time the patient was placed on      aspirin and Plavix.   SURGICAL HISTORY:  The patient has had a bladder surgery, hysterectomy,  cholecystectomy, and bilateral cataract surgery.   ALLERGIES:  1. CODEINE.  2. MACROBID.  3. NORFLEX.  4. ELMIRON.   MEDICATIONS AT THE TIME OF ADMISSION:  1. Aspirin 325 mg daily.  2. Plavix 75 mg daily.  3. Levothyroxine.  4. Losartan/hydrochlorothiazide.  5. Verapamil.   SOCIAL HISTORY:  The patient is married.  She lives in [place] with  her husband.  She does not smoke.  She does not drink alcohol.  She  works as a Materials engineer.   FAMILY HISTORY:  Her mother died at age 69.  She had coronary artery  disease.  She also had a CVA and lymphoma.  Her father died at age 76  from lung cancer.  She has 1 brother who is alive at age 19, he has  diabetes.   HOSPITAL COURSE:  As noted, the patient was admitted to [facility] on September 04, 2008, for stenting and/or angioplasty of a severe  basilar artery stenosis which has been symptomatic.  The procedure was  performed on the day of admission by Dr. Corliss Skains under general  anesthesia.  She had an endovascular angioplasty of the basilar artery.  A stent was not placed at the time of the procedure.  Please see Dr.  Fatima Sanger dictation for full details of the intervention.   The patient tolerated the procedure well.  A brachial approach was used  instead of a right femoral artery approach.  The patient did develop a  small  hematoma following the removal of the arterial sheath.  She also  had some mildly elevated blood pressures on the day following the  intervention which did require Cardene as well as Apresoline for  adequate control.  Because of these issues, a decision was made to delay  discharge until September 06, 2008.   The patient was seen on September 2.  She appeared to be doing much  better.  The hematoma was resolving in her right brachial area.  Her  blood pressure was under good control on her home medications.  She had  had some mild hypokalemia which was supplemented.  A decision was made  to proceed with discharge in stable and improved condition.  The patient  did report that she was having less visual symptoms since undergoing the  angioplasty.   LABORATORY DATA:  A basic metabolic panel on September 1 revealed a BUN  of 10; creatinine was 0.97; GFR was 58; potassium was 3.4, this was  supplemented; glucose was 102; and calcium was 8.1.  A CBC revealed  hemoglobin 11.6, hematocrit 33.8, WBCs 6,4000, and platelets 221,000.  A  CBC on August 27 had revealed hemoglobin 14.5 and hematocrit 41.8.  This  drop in hemoglobin and hematocrit were felt secondary to IV hydration.   DISCHARGE INSTRUCTIONS:  The patient was told to resume her home  medications as taken prior to the procedure.  She was to remain on  Plavix 75 mg daily as well as her aspirin 325 mg daily.  She was told to  stay on a low-fat, low-cholesterol diet.  She was given instructions  regarding wound care.  She was told not to drive or do anything  strenuous for at least 2 weeks.  Dr. Corliss Skains also recommended that she  avoid use of her right upper extremity over the next several days.   A followup angiogram was recommended in 3 months.  The patient will see  Dr. Corliss Skains on Tuesday, September 14 at 2:00 p.m.  She was told to  follow up with her primary care physician, Dr. Hetty Ely, as needed or as  scheduled.   PROBLEM  LIST AT TIME OF DISCHARGE:  1. Status post angioplasty of a symptomatic, severely stenosed basilar      artery performed on September 04, 2008, under general anesthesia      without immediate or known complications.  2. History of  an occluded left vertebral artery as well as a left      common carotid artery.  3. History of transient ischemic attack-like symptoms.  4. History of hypothyroidism.  5. History of hypertension.  6. Hyperlipidemia.  7. Fibromyalgia.  8. Interstitial cystitis.  9. Multiple surgeries in the past.  10.Multiple allergies as listed above next.  11.Mild hypokalemia, supplemented.  12.Mild anemia.      Delton See, P.A.    ______________________________  Grandville Silos. Corliss Skains, M.D.    DR/MEDQ  D:  09/06/2008  T:  09/07/2008  Job:  [id number]   cc:   Arta Silence, MD  Juleen China, MD

## 2010-05-20 NOTE — H&P (Signed)
Kelsey Dunn, FROMER                ACCOUNT NO.:  [id number]   MEDICAL RECORD NO.:  [id number]          PATIENT TYPE:  AMB   LOCATION:  SDS                          FACILITY:  [facility]   PHYSICIAN:  Sanjeev K. Deveshwar, M.D.DATE OF BIRTH:  1940/04/09   DATE OF ADMISSION:  09/04/2008  DATE OF DISCHARGE:                              HISTORY & PHYSICAL   CHIEF COMPLAINT:  Basilar artery stenosis.   BRIEF HISTORY:  This is a pleasant, 70 year old female who was referred  to Dr. Corliss Skains through the courtesy of Dr. Laurita Quint for  evaluation of unsteady gait and poor balance.  The patient awoke on July 05, 2008, and upon standing developed dizziness, vertigo, and bilateral  vision loss.  The  patient had an MRI/MRA performed July 11, 2008.  This  study was consistent with a severe basilar artery stenosis.  The patient  was also noted to have reconstituted flow in the left internal carotid  artery in the siphon region.  Major anterior circulation branches  appeared to be well perfused.   The patient saw Dr. Corliss Skains in consultation on July 14, 2008.  She had  a cerebral angiogram on July 15th.  The angiogram confirmed an 85% to  90% stenosis of the mid-basilar artery.  She had an angiographically  occluded left vertebral artery at the level of the C1 vertebral body.  She had an angiographically occluded left common carotid artery just  distal to its origin.  There was substantial opacification of the left  anterior and left middle cerebral artery distributions via the anterior  communicating artery from the right internal carotid artery injection.   The patient met once again with Dr. Corliss Skains on July 26, 2008.  At that  time, treatment options were discussed and a decision was made to  proceed with stent-assisted angioplasty of the basilar artery stenosis.   PAST MEDICAL HISTORY:  The patient has a history of hypothyroidism,  hypertension, hyperlipidemia, fibromyalgia, and  interstitial cystitis.  She had a known occlusion of the left common carotid artery which  occurred approximately 25 years ago.  The patient had previously been on  Coumadin therapy for this occlusion.  The Coumadin was stopped in early  July and the patient was placed on aspirin and Plavix.   SURGICAL HISTORY:  The patient has had a bladder surgery, hysterectomy,  cholecystectomy, and bilateral cataract surgery.   ALLERGIES:  Include CODEINE, MACROBID, NORFLEX, AND ELMIRON.  She denies  allergies to shrimp, iodine, shellfish, or latex.   MEDICATIONS ON ADMISSION:  1. Aspirin 325 mg daily.  2. Plavix 75 mg daily.  3. Levothyroxine.  4. Losartan.  5. Hydrochlorothiazide.  6. Verapamil.   SOCIAL HISTORY:  The patient is married.  She lives in [place] with  her husband.  She does not smoke.  She does not drink alcohol.  She  works as a Materials engineer.   FAMILY HISTORY:  Her mother died at age 74.  She had coronary artery  disease.  She had also had a CVA and lymphoma.  Her father died at age  79  from lung cancer.  She has 1 brother alive at age 30.  He has  diabetes.   REVIEW OF SYSTEMS:  Completely negative except for the following:  The  patient continues to have poor balance.  She feels that she has done  better on Plavix although her symptoms remain.  She has a history of  fibromyalgia.  She has hypothyroidism.  The remainder of review of  systems was negative except for as noted.   LABORATORY DATA:  INR was 1.0.  Pro time was 13.4, PTT was 30.  BUN was  11, creatinine 1.01, potassium was 3.9, glucose was 108.  Her GFR was  55, hemoglobin 14.5, hematocrit 41.8, WBC 6.1, platelets 261,000.   PHYSICAL EXAM:  Revealed a pleasant but somewhat anxious 70 year old  white female in no acute distress.  VITAL SIGNS:  Blood pressure 155/88, pulse 72, respirations 20,  temperature 97, oxygen saturation 92% on room air.  HEENT:  Unremarkable.  NECK:  Revealed no bruits.   HEART:  Revealed regular rate and rhythm with distant heart sounds.  LUNGS:  Clear.  ABDOMEN: Soft, nontender.  EXTREMITIES:  Revealed pulses to be intact with trace edema.  Her airway was rated at a 2.  Her ASA scale was a 3.  NEUROLOGICAL:  Exam revealed the patient to be alert and oriented, and  following commands.  Cranial nerves II-XII are grossly intact.  Sensation was intact to light touch.  Motor strength is 5/5 throughout.  Cerebellar testing was intact.   IMPRESSION:  1. Cerebrovascular disease as documented by a cerebral angiogram      performed July 19, 2008, including an 85% to 90% stenosis of the      mid-basilar artery.  2. History of an occluded left vertebral artery as well as left common      carotid artery  3. Transient ischemic attack-like symptoms  4. History of hypothyroidism.  5. History of hypertension.  6. Hyperlipidemia.  7. Fibromyalgia.  8. Interstitial cystitis.  9. Multiple surgeries in the past.  10.ALLERGIES TO CODEINE, MACROBID, NORFLEX, AND ELMIRON.   PLAN:  The plan will be to proceed with a stent-assisted angioplasty of  the basilar artery as discussed in consultation with Dr. Corliss Skains.  The  risks of the procedure and other treatment options have previously been  discussed at the consultation on July 26, 2008.      Delton See, P.A.    ______________________________  Grandville Silos. Corliss Skains, M.D.    DR/MEDQ  D:  09/04/2008  T:  09/04/2008  Job:  [id number]   cc:   Arta Silence, MD  Juleen China, MD

## 2010-07-02 ENCOUNTER — Other Ambulatory Visit: Payer: Self-pay | Admitting: Cardiovascular Disease

## 2010-07-25 ENCOUNTER — Other Ambulatory Visit: Payer: Self-pay | Admitting: Family Medicine

## 2010-07-31 ENCOUNTER — Other Ambulatory Visit: Payer: Self-pay | Admitting: Family Medicine

## 2010-08-04 ENCOUNTER — Encounter: Payer: Self-pay | Admitting: Family Medicine

## 2010-08-07 ENCOUNTER — Ambulatory Visit (INDEPENDENT_AMBULATORY_CARE_PROVIDER_SITE_OTHER): Payer: Medicare HMO | Admitting: Family Medicine

## 2010-08-07 ENCOUNTER — Encounter: Payer: Self-pay | Admitting: Family Medicine

## 2010-08-07 DIAGNOSIS — I2510 Atherosclerotic heart disease of native coronary artery without angina pectoris: Secondary | ICD-10-CM

## 2010-08-07 DIAGNOSIS — E039 Hypothyroidism, unspecified: Secondary | ICD-10-CM

## 2010-08-07 DIAGNOSIS — I651 Occlusion and stenosis of basilar artery: Secondary | ICD-10-CM

## 2010-08-07 DIAGNOSIS — I10 Essential (primary) hypertension: Secondary | ICD-10-CM

## 2010-08-07 DIAGNOSIS — I779 Disorder of arteries and arterioles, unspecified: Secondary | ICD-10-CM

## 2010-08-07 LAB — COMPREHENSIVE METABOLIC PANEL
Albumin: 4.4 g/dL (ref 3.5–5.2)
Alkaline Phosphatase: 60 U/L (ref 39–117)
CO2: 29 mEq/L (ref 19–32)
Chloride: 101 mEq/L (ref 96–112)
GFR: 62.22 mL/min (ref >60.00)
Glucose, Bld: 104 mg/dL — ABNORMAL HIGH (ref 70–99)
Potassium: 4.4 mEq/L (ref 3.5–5.1)
Sodium: 141 mEq/L (ref 135–145)
Total Protein: 6.9 g/dL (ref 6.0–8.3)

## 2010-08-07 LAB — LIPID PANEL
Cholesterol: 193 mg/dL (ref 0–200)
Total CHOL/HDL Ratio: 5

## 2010-08-07 NOTE — Progress Notes (Signed)
CAD: She has f/u with cards in 8/12.  No CP/SOB usually.  If she gets going faster than 3.5 miles an hour, she gets a feeling on indigestion.   There is a question of relation to food/timing of meals.  No other CP when walking up to 3.3 miles an hour.  Walking up to 1 mile on treadmill.  Down ~11 lbs in last year.  Trying to lose weight gradually.  No NTG use.    She has f/u with rady about basilar artery stenosis.  No new focal neuro sx.    HTN. Tolerating current meds.  No edema.  Not sob.    H/o hypothyroidism.  Due for labs.  No ant neck pain or neck mass.   PMH and SH reviewed  ROS: See HPI, otherwise noncontributory.  Meds, vitals, and allergies reviewed.   nad ncat Mmm Neck supple, thyroid not ttp, no mass noted. rrr ctab Ext w/o edema

## 2010-08-07 NOTE — Patient Instructions (Addendum)
I would get a flu shot each fall.   Please schedule a physical for early 2013.   Take care.  Glad to see you.

## 2010-08-08 ENCOUNTER — Encounter: Payer: Self-pay | Admitting: *Deleted

## 2010-08-08 ENCOUNTER — Encounter: Payer: Self-pay | Admitting: Family Medicine

## 2010-08-08 MED ORDER — LEVOTHYROXINE SODIUM 100 MCG PO TABS: 100.0000 ug | ORAL_TABLET | Freq: Every day | ORAL | Status: DC

## 2010-08-08 NOTE — Assessment & Plan Note (Signed)
Statin intolerant. Continue with gradual weight loss.

## 2010-08-08 NOTE — Progress Notes (Signed)
Addended by: Lars Mage on: 08/08/2010 12:32 AM   Modules accepted: Orders

## 2010-08-08 NOTE — Assessment & Plan Note (Signed)
See note on labs. No change in meds.  TSH wnl.

## 2010-08-08 NOTE — Assessment & Plan Note (Signed)
BP controlled today, no change in meds.

## 2010-08-08 NOTE — Assessment & Plan Note (Signed)
Per rady.  No new neuro sx.

## 2010-08-13 ENCOUNTER — Other Ambulatory Visit: Payer: Self-pay | Admitting: Family Medicine

## 2010-09-16 ENCOUNTER — Other Ambulatory Visit (HOSPITAL_COMMUNITY): Payer: Self-pay | Admitting: Interventional Radiology

## 2010-09-25 ENCOUNTER — Ambulatory Visit (HOSPITAL_COMMUNITY)
Admission: RE | Admit: 2010-09-25 | Discharge: 2010-09-25 | Disposition: A | Discharge status: Home / Self Care | Payer: Medicare HMO | Source: Ambulatory Visit | Attending: Interventional Radiology | Admitting: Interventional Radiology

## 2010-09-25 DIAGNOSIS — R42 Dizziness and giddiness: Secondary | ICD-10-CM | POA: Insufficient documentation

## 2010-09-25 DIAGNOSIS — I651 Occlusion and stenosis of basilar artery: Secondary | ICD-10-CM | POA: Insufficient documentation

## 2010-10-06 ENCOUNTER — Ambulatory Visit (INDEPENDENT_AMBULATORY_CARE_PROVIDER_SITE_OTHER): Payer: Medicare HMO | Admitting: Cardiovascular Disease

## 2010-10-06 ENCOUNTER — Encounter: Payer: Self-pay | Admitting: Cardiovascular Disease

## 2010-10-06 VITALS — BP 135/80 | HR 63 | Ht 61.0 in | Wt 211.4 lb

## 2010-10-06 DIAGNOSIS — I2510 Atherosclerotic heart disease of native coronary artery without angina pectoris: Secondary | ICD-10-CM

## 2010-10-06 NOTE — Assessment & Plan Note (Signed)
Stable. No changes. She does not tolerate statins.

## 2010-10-06 NOTE — Progress Notes (Signed)
History of Present Illness:70 yo WF with h/o CAD with DES RCA in June 2011, HTN, hyperlipidemia, hypothyroidism and basilar artery stenosis here today for follow up.  Left heart cath on 06/13/09 and she was found to have a severe stenosis in the RCA. There was minor disease in the LAD and Circumflex.  She was brought back in for PCI on 06/19/09 and I placed a single DES in the RCA.   She has done well. She has had no chest pain or SOB.  She has been undergoing cerebral angiograms for basilar artery stenosis and has had angiplasty of the basilar artery in August 2010. Last f/u last week with brain MRA, stable. Echo July 2010 with mild LVH, normal LV systolic function, EF of 60%, no valvular issues.  She is known to have an occluded left common carotid artery. She stopped her Simvastatin due to left leg pain. She is known to have good lipid profiles but has CAD. She is walking every day on her treadmill. She has lost 30 lbs over the last 15 months.   Past Medical History  Diagnosis Date  . Hypothyroidism 1990  . Labile hypertension 08/1991  . HLD (hyperlipidemia) 01/1997  . Fibromyalgia   . Bladder spasms   . Basilar artery stenosis     90%  . CAD (coronary artery disease)     severe stenosis RCA with placement drug eluting stent     Past Surgical History  Procedure Date  . Bladder suspension 1974  . Total abdominal hysterectomy 1974    dysmennorhea  . Cholecystectomy 1985  . Cardiac catheterization 1993    normal  . Renal angioplasty 1993    normal  . Carotid US 11/93    Occl L comm carotid  . Visual evoked response 11/93; 1/01    normal  . US renal/aorta 12/93    Normal  . Emg 1/01    LE, RUE normal  . Carotid US 4/98    L comm occl R-ok  . Carotid US 01/30/03    L occl  R less 40%  . Dexa 10/15/04    Troch -0.4 o/w pos  . Carotid arteriogram     bilat  . Bilateral vert & subclavian angiogram 07/19/08    85-90% stenosis mid basilar art; occluded left vert artery; occluded left  comm carotid artery w/ collat flow  . Basilar artery angioplasty 09/04/08    sm right brachial hematoma  . Cath single vess dz 06/13/09    70% stenosis RCA EF 65-70%  . Coronary angioplasty with stent placement 06/19/09    Current Outpatient Prescriptions  Medication Sig Dispense Refill  . aspirin 325 MG buffered tablet Take 325 mg by mouth daily.        . carvedilol (COREG) 6.25 MG tablet TAKE 1 TABLET BY MOUTH TWICE A DAY  180 tablet  3  . clopidogrel (PLAVIX) 75 MG tablet Take 75 mg by mouth daily.        Marland Kitchen levothyroxine (SYNTHROID, LEVOTHROID) 100 MCG tablet Take 1 tablet (100 mcg total) by mouth daily.  90 tablet  3  . losartan-hydrochlorothiazide (HYZAAR) 100-25 MG per tablet TAKE 1 TABLET BY MOUTH EVERYDAY  30 tablet  6  . nitroGLYCERIN (NITROSTAT) 0.4 MG SL tablet Place 0.4 mg under the tongue every 5 (five) minutes as needed.          Allergies  Allergen Reactions  . Codeine   . Nitrofurantoin     REACTION: unspecified  .  Orphenadrine Citrate     REACTION: unspecified  . Pentosan Polysulfate Sodium     REACTION: rash/ severe headache  . Statins     myalgias    History   Social History  . Marital Status: Married    Spouse Name: N/A    Number of Children: 1  . Years of Education: N/A   Occupational History  . Bookkeeper-part time    Social History Main Topics  . Smoking status: Never Smoker   . Smokeless tobacco: Not on file  . Alcohol Use: No  . Drug Use: No  . Sexually Active: Not on file   Other Topics Concern  . Not on file   Social History Narrative   Retired 2007 ([employer] 1 [place]    Family History  Problem Relation Age of Onset  . Lung cancer Father   . Heart attack Mother   . Lymphoma Mother     s/p chemo  . Stroke Mother     x 2  . Diabetes Brother     Review of Systems:  As stated in the HPI and otherwise negative.   BP 135/80  Pulse 63  Ht 5\' 1"  (1.549 m)  Wt 211 lb 6.4 oz  (95.89 kg)  BMI 39.94 kg/m2  Physical Examination: General: Well developed, well nourished, NAD HEENT: OP clear, mucus membranes moist SKIN: warm, dry. No rashes. Neuro: No focal deficits Musculoskeletal: Muscle strength 5/5 all ext Psychiatric: Mood and affect normal Neck: No JVD, no carotid bruits, no thyromegaly, no lymphadenopathy. Lungs:Clear bilaterally, no wheezes, rhonci, crackles Cardiovascular: Regular rate and rhythm. No murmurs, gallops or rubs. Abdomen:Soft. Bowel sounds present. Non-tender.  Extremities: No lower extremity edema. Pulses are 2 + in the bilateral DP/PT.  EKG:NSR, rate 63 bpm.

## 2010-10-06 NOTE — Patient Instructions (Signed)
Your physician wants you to follow-up in:  6 months. You will receive a reminder letter in the mail two months in advance. If you don't receive a letter, please call our office to schedule the follow-up appointment.   

## 2010-10-09 ENCOUNTER — Telehealth: Payer: Self-pay | Admitting: Cardiovascular Disease

## 2010-10-09 ENCOUNTER — Encounter: Payer: Self-pay | Admitting: Family Medicine

## 2010-10-09 ENCOUNTER — Ambulatory Visit (INDEPENDENT_AMBULATORY_CARE_PROVIDER_SITE_OTHER): Payer: Medicare HMO | Admitting: Family Medicine

## 2010-10-09 VITALS — BP 166/80 | HR 80 | Temp 99.1°F | Wt 214.8 lb

## 2010-10-09 DIAGNOSIS — N390 Urinary tract infection, site not specified: Secondary | ICD-10-CM

## 2010-10-09 DIAGNOSIS — R350 Frequency of micturition: Secondary | ICD-10-CM

## 2010-10-09 LAB — POCT URINALYSIS DIPSTICK
Bilirubin, UA: NEGATIVE
Glucose, UA: NEGATIVE
Ketones, UA: NEGATIVE
Nitrite, UA: NEGATIVE
Spec Grav, UA: 1.02

## 2010-10-09 MED ORDER — CIPROFLOXACIN HCL 500 MG PO TABS: 500.0000 mg | ORAL_TABLET | Freq: Two times a day (BID) | ORAL | Status: AC

## 2010-10-09 MED ORDER — PHENAZOPYRIDINE HCL 100 MG PO TABS: 100.0000 mg | ORAL_TABLET | Freq: Three times a day (TID) | ORAL | Status: AC | PRN

## 2010-10-09 NOTE — Patient Instructions (Addendum)
I do think you have urinary infection.  I have sent culture and would like you to start ciprofloxacin twice daily for 3 days. Push fluids and plenty of rest.  Cranberry juice will help.  Tylenol as well. I wonder if elevated blood pressure readings due to this current illness. I'd like you to keep track of blood pressures for next few days and over weekend and would expect them to come down with treatment. If not, please let us or cardiology know. Take pyridium for next 1-2 days.

## 2010-10-09 NOTE — Telephone Encounter (Signed)
Spoke with pt who reports she was dizzy this AM and checked blood pressure and it was 183/123. She questioned the accuracy of this reading and went to fire dept where it was checked and found to be 164/86. Dizziness had improved at this time. She returned home and rechecked blood pressure and it was 154/86. She also notes trouble urinating and temp. Of 99.9. She has had urinary problems in the past and is worried she may have a UTI and this may be contributing to blood pressure elevation. She has scheduled an appointment with primary MD today at 3 PM. She is asking if it is OK with Dr. Clifton James for primary MD to adjust blood pressure medications if needed.  I told pt Dr. Clifton James is comfortable with primary MD making these adjustments if needed.  She will have primary MD route note to Dr. Clifton James after office visit.

## 2010-10-09 NOTE — Assessment & Plan Note (Signed)
sxs and micro suspicious for this. Will send culture and treat presumptively with cipro. Update if not improving. H/o labile HTN. ? If current elevated readings last few days due to acute illness, advised pt push fluids and rest, monitor bp and would expect to drop with treatment, if not to let us or cards know.

## 2010-10-09 NOTE — Telephone Encounter (Signed)
Pt having problem with bp has appt with pcp but wants to talk with nurse first

## 2010-10-09 NOTE — Progress Notes (Signed)
Subjective:    Patient ID: Kelsey Dunn, female    DOB: 10-May-1940, 69 y.o.   MRN: [id number]  HPI CC: f/u bp as well as ?UTI  Pt with h/o CAD with stents, IC, left 100% carotid stenosis as well as 90% basilar stenosis.  Still walked 1 mile this morning on treadmill.  For last few days has been feeling ill.  Yesterday started having bladder sxs.  Feeling some lightheaded and slightly elevated temp, Tmax 99.5.  Having frequency, dribbling and incomplete voiding, some spasms when voiding.  bp has been labile last few days - 180/120.  BP Readings from Last 3 Encounters:  10/09/10 166/80  10/06/10 135/80  08/07/10 130/86   No hematuria, urgency.  No nausea/vomiting, flank pain or back pain.  No HA, vision changes, chest pain/tightness, SOB.  No neck pain, stiffness.  Saw cards on Monday.  Medications and allergies reviewed and updated in chart.  Past histories reviewed and updated if relevant as below. Patient Active Problem List  Diagnoses  . UNSPECIFIED HYPOTHYROIDISM  . UNSPECIFIED VITAMIN D DEFICIENCY  . PURE HYPERCHOLESTEROLEMIA  . COMMON MIGRAINE  . DETACHED RETINA  . HYPERTENSION, MALIGNANT ESSENTIAL  . ESSENTIAL HYPERTENSION  . CAD, NATIVE VESSEL  . OCCLUSION&STENOS BASILAR ART W/O MENTION INFARCT  . PVD  . MENOPAUSAL SYNDROME  . FIBROMYALGIA  . FOOT PAIN, LEFT  . GAIT DISTURBANCE  . DYSPNEA  . CHEST PAIN-PRECORDIAL  . DYSURIA  . HYPERGLYCEMIA   Past Medical History  Diagnosis Date  . Hypothyroidism 1990  . Labile hypertension 08/1991  . HLD (hyperlipidemia) 01/1997  . Fibromyalgia   . Bladder spasms   . Basilar artery stenosis     90%  . CAD (coronary artery disease)     severe stenosis RCA with placement drug eluting stent    Past Surgical History  Procedure Date  . Bladder suspension 1974  . Total abdominal hysterectomy 1974    dysmennorhea  . Cholecystectomy 1985  . Cardiac catheterization 1993    normal  . Renal angioplasty 1993    normal  .  Carotid US 11/93    Occl L comm carotid  . Visual evoked response 11/93; 1/01    normal  . US renal/aorta 12/93    Normal  . Emg 1/01    LE, RUE normal  . Carotid US 4/98    L comm occl R-ok  . Carotid US 01/30/03    L occl  R less 40%  . Dexa 10/15/04    Troch -0.4 o/w pos  . Carotid arteriogram     bilat  . Bilateral vert & subclavian angiogram 07/19/08    85-90% stenosis mid basilar art; occluded left vert artery; occluded left comm carotid artery w/ collat flow  . Basilar artery angioplasty 09/04/08    sm right brachial hematoma  . Cath single vess dz 06/13/09    70% stenosis RCA EF 65-70%  . Coronary angioplasty with stent placement 06/19/09   History  Substance Use Topics  . Smoking status: Never Smoker   . Smokeless tobacco: Not on file  . Alcohol Use: No   Family History  Problem Relation Age of Onset  . Lung cancer Father   . Heart attack Mother   . Lymphoma Mother     s/p chemo  . Stroke Mother     x 2  . Diabetes Brother    Allergies  Allergen Reactions  . Codeine   . Nitrofurantoin     REACTION:  unspecified  . Orphenadrine Citrate     REACTION: unspecified  . Pentosan Polysulfate Sodium     REACTION: rash/ severe headache  . Statins     myalgias   Current Outpatient Prescriptions on File Prior to Visit  Medication Sig Dispense Refill  . aspirin 325 MG buffered tablet Take 325 mg by mouth daily.        . carvedilol (COREG) 6.25 MG tablet TAKE 1 TABLET BY MOUTH TWICE A DAY  180 tablet  3  . clopidogrel (PLAVIX) 75 MG tablet Take 75 mg by mouth daily.        Marland Kitchen levothyroxine (SYNTHROID, LEVOTHROID) 100 MCG tablet Take 1 tablet (100 mcg total) by mouth daily.  90 tablet  3  . losartan-hydrochlorothiazide (HYZAAR) 100-25 MG per tablet TAKE 1 TABLET BY MOUTH EVERYDAY  30 tablet  6  . nitroGLYCERIN (NITROSTAT) 0.4 MG SL tablet Place 0.4 mg under the tongue every 5 (five) minutes as needed.         Review of Systems Per HPI    Objective:   Physical Exam    Nursing note and vitals reviewed. Constitutional: She appears well-developed and well-nourished. No distress.  HENT:  Head: Normocephalic and atraumatic.  Mouth/Throat: Oropharynx is clear and moist. No oropharyngeal exudate.  Eyes: Conjunctivae and EOM are normal. Pupils are equal, round, and reactive to light. No scleral icterus.  Neck: Normal range of motion. Neck supple.  Cardiovascular: Normal rate, regular rhythm, normal heart sounds and intact distal pulses.   No murmur heard. Pulmonary/Chest: Effort normal and breath sounds normal. No respiratory distress. She has no wheezes. She has no rales.  Abdominal: Soft. Bowel sounds are normal. She exhibits no distension. There is no hepatosplenomegaly. There is no tenderness. There is no rebound, no guarding and no CVA tenderness.  Musculoskeletal: She exhibits no edema.  Lymphadenopathy:    She has no cervical adenopathy.  Skin: Skin is warm and dry. No rash noted.          Assessment & Plan:

## 2010-10-09 NOTE — Telephone Encounter (Signed)
Reviewed and agree. cdm 

## 2010-10-11 LAB — URINE CULTURE: Colony Count: 35000

## 2010-10-28 ENCOUNTER — Ambulatory Visit (INDEPENDENT_AMBULATORY_CARE_PROVIDER_SITE_OTHER): Payer: Medicare HMO | Admitting: Family Medicine

## 2010-10-28 ENCOUNTER — Encounter: Payer: Self-pay | Admitting: Family Medicine

## 2010-10-28 VITALS — BP 122/68 | HR 66 | Temp 98.2°F | Wt 213.0 lb

## 2010-10-28 DIAGNOSIS — R300 Dysuria: Secondary | ICD-10-CM

## 2010-10-28 LAB — POCT URINALYSIS DIPSTICK
Protein, UA: NEGATIVE
Urobilinogen, UA: NEGATIVE
pH, UA: 6

## 2010-10-28 MED ORDER — FLUCONAZOLE 150 MG PO TABS: ORAL_TABLET | ORAL | Status: DC

## 2010-10-28 NOTE — Assessment & Plan Note (Addendum)
Check UCx, treat if pos and sx continue.  Treat for yeast in meantime. Nontoxic, f/u prn.  She agrees.

## 2010-10-28 NOTE — Patient Instructions (Signed)
Take the diflucan and we'll contact you with your lab report.  Let me know if you aren't improving.  Take care.

## 2010-10-28 NOTE — Progress Notes (Signed)
In on 10/4 with likely UTI, took cipro and AZO.  She stopped having mild elevation in temp until last few days (had temp of 99.5 last few days).  H/o IC and "I don't know if I'm having a UTI."  Dysuria: some, externally with some itching after urination.   duration of symptoms: for a few days abdominal pain: no Fevers:none >100.4 back pain:no vomiting:no other concerns:some frequency.    Meds, vitals, and allergies reviewed.   ROS: See HPI.  Otherwise negative.    GEN: nad, alert and oriented HEENT: mucous membranes moist NECK: supple CV: rrr.  PULM: ctab, no inc wob ABD: soft, +bs, suprapubic area not tender EXT: no edema SKIN: no acute rash BACK: no CVA pain  Trace LE on U/a.  Scant yeast on WP.

## 2010-10-30 LAB — URINE CULTURE: Colony Count: 15000

## 2010-11-05 ENCOUNTER — Ambulatory Visit (INDEPENDENT_AMBULATORY_CARE_PROVIDER_SITE_OTHER): Payer: Medicare HMO

## 2010-11-05 DIAGNOSIS — Z23 Encounter for immunization: Secondary | ICD-10-CM

## 2010-11-11 ENCOUNTER — Other Ambulatory Visit (HOSPITAL_COMMUNITY): Payer: Self-pay

## 2010-12-19 ENCOUNTER — Other Ambulatory Visit: Payer: Self-pay

## 2010-12-19 ENCOUNTER — Encounter (HOSPITAL_COMMUNITY): Payer: Self-pay

## 2010-12-19 ENCOUNTER — Emergency Department (HOSPITAL_COMMUNITY): Payer: Medicare HMO

## 2010-12-19 ENCOUNTER — Inpatient Hospital Stay (HOSPITAL_COMMUNITY)
Admission: EM | Admit: 2010-12-19 | Discharge: 2010-12-22 | DRG: 065 | Disposition: A | Discharge status: Home / Self Care | Payer: Medicare HMO | Source: Ambulatory Visit | Attending: Family Medicine | Admitting: Family Medicine

## 2010-12-19 DIAGNOSIS — I10 Essential (primary) hypertension: Secondary | ICD-10-CM | POA: Diagnosis present

## 2010-12-19 DIAGNOSIS — R42 Dizziness and giddiness: Secondary | ICD-10-CM

## 2010-12-19 DIAGNOSIS — I6350 Cerebral infarction due to unspecified occlusion or stenosis of unspecified cerebral artery: Principal | ICD-10-CM | POA: Diagnosis present

## 2010-12-19 DIAGNOSIS — I639 Cerebral infarction, unspecified: Secondary | ICD-10-CM

## 2010-12-19 DIAGNOSIS — R55 Syncope and collapse: Secondary | ICD-10-CM | POA: Diagnosis present

## 2010-12-19 DIAGNOSIS — E876 Hypokalemia: Secondary | ICD-10-CM | POA: Diagnosis present

## 2010-12-19 DIAGNOSIS — E039 Hypothyroidism, unspecified: Secondary | ICD-10-CM | POA: Diagnosis present

## 2010-12-19 DIAGNOSIS — Z9861 Coronary angioplasty status: Secondary | ICD-10-CM

## 2010-12-19 DIAGNOSIS — Z7902 Long term (current) use of antithrombotics/antiplatelets: Secondary | ICD-10-CM

## 2010-12-19 DIAGNOSIS — G459 Transient cerebral ischemic attack, unspecified: Secondary | ICD-10-CM | POA: Diagnosis present

## 2010-12-19 DIAGNOSIS — I2510 Atherosclerotic heart disease of native coronary artery without angina pectoris: Secondary | ICD-10-CM | POA: Diagnosis present

## 2010-12-19 DIAGNOSIS — E785 Hyperlipidemia, unspecified: Secondary | ICD-10-CM | POA: Diagnosis present

## 2010-12-19 HISTORY — DX: Encounter for other specified aftercare: Z51.89

## 2010-12-19 HISTORY — DX: Essential (primary) hypertension: I10

## 2010-12-19 HISTORY — DX: Pneumonia, unspecified organism: J18.9

## 2010-12-19 HISTORY — DX: Reserved for inherently not codable concepts without codable children: IMO0001

## 2010-12-19 LAB — BASIC METABOLIC PANEL
BUN: 23 mg/dL (ref 6–23)
GFR calc Af Amer: 71 mL/min — ABNORMAL LOW (ref >90)
GFR calc non Af Amer: 61 mL/min — ABNORMAL LOW (ref >90)
Potassium: 3 mEq/L — ABNORMAL LOW (ref 3.5–5.1)
Sodium: 141 mEq/L (ref 135–145)

## 2010-12-19 LAB — DIFFERENTIAL
Basophils Absolute: 0 10*3/uL (ref 0.0–0.1)
Basophils Relative: 0 % (ref 0–1)
Eosinophils Absolute: 0.3 10*3/uL (ref 0.0–0.7)
Monocytes Relative: 8 % (ref 3–12)
Neutrophils Relative %: 67 % (ref 43–77)

## 2010-12-19 LAB — URINALYSIS, ROUTINE W REFLEX MICROSCOPIC
Bilirubin Urine: NEGATIVE
Nitrite: NEGATIVE
Specific Gravity, Urine: 1.015 (ref 1.005–1.030)
Urobilinogen, UA: 0.2 mg/dL (ref 0.0–1.0)

## 2010-12-19 LAB — GLUCOSE, CAPILLARY: Glucose-Capillary: 128 mg/dL — ABNORMAL HIGH (ref 70–99)

## 2010-12-19 LAB — CBC
MCH: 30.6 pg (ref 26.0–34.0)
MCHC: 34.7 g/dL (ref 30.0–36.0)
Platelets: 230 10*3/uL (ref 150–400)
RDW: 13 % (ref 11.5–15.5)

## 2010-12-19 MED ORDER — NITROGLYCERIN 0.4 MG SL SUBL: 0.4000 mg | SUBLINGUAL_TABLET | SUBLINGUAL | Status: DC | PRN

## 2010-12-19 MED ORDER — CARVEDILOL 6.25 MG PO TABS
6.2500 mg | ORAL_TABLET | Freq: Two times a day (BID) | ORAL | Status: DC
  Administered 2010-12-20 – 2010-12-22 (×6): 6.25 mg via ORAL
  Filled 2010-12-19 (×8): qty 1

## 2010-12-19 MED ORDER — LEVOTHYROXINE SODIUM 100 MCG PO TABS
100.0000 ug | ORAL_TABLET | Freq: Every day | ORAL | Status: DC
  Administered 2010-12-20 – 2010-12-22 (×3): 100 ug via ORAL
  Filled 2010-12-19 (×5): qty 1

## 2010-12-19 MED ORDER — PANTOPRAZOLE SODIUM 40 MG PO TBEC
40.0000 mg | DELAYED_RELEASE_TABLET | Freq: Every day | ORAL | Status: DC
  Administered 2010-12-20 – 2010-12-22 (×3): 40 mg via ORAL
  Filled 2010-12-19 (×3): qty 1

## 2010-12-19 MED ORDER — CARVEDILOL 6.25 MG PO TABS
6.2500 mg | ORAL_TABLET | ORAL | Status: DC
  Filled 2010-12-19: qty 1

## 2010-12-19 MED ORDER — CLOPIDOGREL BISULFATE 75 MG PO TABS
75.0000 mg | ORAL_TABLET | Freq: Every day | ORAL | Status: DC
  Administered 2010-12-20 – 2010-12-22 (×3): 75 mg via ORAL
  Filled 2010-12-19 (×3): qty 1

## 2010-12-19 MED ORDER — LOSARTAN POTASSIUM-HCTZ 100-25 MG PO TABS: 1.0000 | ORAL_TABLET | Freq: Every day | ORAL | Status: DC

## 2010-12-19 MED ORDER — GADOBENATE DIMEGLUMINE 529 MG/ML IV SOLN
20.0000 mL | Freq: Once | INTRAVENOUS | Status: AC | PRN
  Administered 2010-12-19: 20 mL via INTRAVENOUS

## 2010-12-19 MED ORDER — CARVEDILOL 6.25 MG PO TABS: 6.2500 mg | ORAL_TABLET | Freq: Two times a day (BID) | ORAL | Status: DC

## 2010-12-19 NOTE — ED Notes (Signed)
Pt states that this afternoon she had onset of severe dizziness while she was sitting in a chair. She was hypertensive with ems. Pt states that earlier today she went to [employer] and upon returning from grocery shopping she had some left sided chest pain. Pt has hx of some kind of blockage in her cad and she also has some kind of pmh that involves blockage in her brain as well. She was instructed by pcp to call if dizziness occurred. Pt states that she had intermittent visual changes and describes them as things going black.

## 2010-12-19 NOTE — ED Notes (Signed)
CBG 128 at 18:28

## 2010-12-19 NOTE — ED Provider Notes (Signed)
History     CSN: [id number] Arrival date & time: 12/19/2010  5:44 PM   First MD Initiated Contact with Patient 12/19/10 1749      Chief Complaint  Patient presents with  . Dizziness  HPI Patient presents to the emergency room with complaint of dizziness/unsteadiness/weakness that started one hour prior to arrival. Patient reports that she also had episodes where she felt as though "everything goes black." Patient states she has a PMH significant for basilar artery stenosis. Denies any visual changes or deficits. No weakness. Unsteady gait. Patient had one fleeting episode of chest pain that was located on the right side that lasted only a few seconds. Denies having any exertional chest pain/sob.    Past Medical History  Diagnosis Date  . Hypothyroidism 1990  . Labile hypertension 08/1991  . HLD (hyperlipidemia) 01/1997  . Fibromyalgia   . Bladder spasms   . Basilar artery stenosis     90%  . CAD (coronary artery disease)     severe stenosis RCA with placement drug eluting stent   . IC (interstitial cystitis)     Past Surgical History  Procedure Date  . Bladder suspension 1974  . Total abdominal hysterectomy 1974    dysmennorhea  . Cholecystectomy 1985  . Cardiac catheterization 1993    normal  . Renal angioplasty 1993    normal  . Carotid US 11/93    Occl L comm carotid  . Visual evoked response 11/93; 1/01    normal  . US renal/aorta 12/93    Normal  . Emg 1/01    LE, RUE normal  . Carotid US 4/98    L comm occl R-ok  . Carotid US 01/30/03    L occl  R less 40%  . Dexa 10/15/04    Troch -0.4 o/w pos  . Carotid arteriogram     bilat  . Bilateral vert & subclavian angiogram 07/19/08    85-90% stenosis mid basilar art; occluded left vert artery; occluded left comm carotid artery w/ collat flow  . Basilar artery angioplasty 09/04/08    sm right brachial hematoma  . Cath single vess dz 06/13/09    70% stenosis RCA EF 65-70%  . Coronary angioplasty with stent  placement 06/19/09    Family History  Problem Relation Age of Onset  . Lung cancer Father   . Heart attack Mother   . Lymphoma Mother     s/p chemo  . Stroke Mother     x 2  . Diabetes Brother     History  Substance Use Topics  . Smoking status: Never Smoker   . Smokeless tobacco: Not on file  . Alcohol Use: No    OB History    Grav Para Term Preterm Abortions TAB SAB Ect Mult Living                  Review of Systems  Constitutional: Negative for fever, chills, diaphoresis and appetite change.  HENT: Negative for neck pain.   Eyes: Negative for photophobia and visual disturbance.  Respiratory: Negative for cough, chest tightness and shortness of breath.   Cardiovascular: Negative for chest pain.  Gastrointestinal: Negative for nausea, vomiting and abdominal pain.  Genitourinary: Negative for flank pain.  Musculoskeletal: Negative for back pain.  Skin: Negative for rash.  Neurological: Positive for dizziness. Negative for tremors, seizures, syncope, facial asymmetry, speech difficulty, weakness, light-headedness, numbness and headaches.  All other systems reviewed and are negative.    Allergies  Codeine; Nitrofurantoin; Orphenadrine citrate; Pentosan polysulfate sodium; and Statins  Home Medications   Current Outpatient Rx  Name Route Sig Dispense Refill  . CLOPIDOGREL BISULFATE 75 MG PO TABS Oral Take 75 mg by mouth daily.     Marland Kitchen LANSOPRAZOLE 15 MG PO CPDR Oral Take 15 mg by mouth as needed.      Marland Kitchen LEVOTHYROXINE SODIUM 100 MCG PO TABS Oral Take 1 tablet (100 mcg total) by mouth daily. 90 tablet 3  . LOSARTAN POTASSIUM-HCTZ 100-25 MG PO TABS  TAKE 1 TABLET BY MOUTH EVERYDAY 30 tablet 6  . NITROGLYCERIN 0.4 MG SL SUBL Sublingual Place 0.4 mg under the tongue every 5 (five) minutes as needed.        BP 226/98  Pulse 93  Temp(Src) 99 F (37.2 C) (Oral)  Resp 20  SpO2 95%  Physical Exam  Nursing note and vitals reviewed. Constitutional: She is oriented to  person, place, and time. She appears well-developed and well-nourished.  Non-toxic appearance. No distress.       Vital signs stable  HENT:  Head: Normocephalic and atraumatic.  Eyes: EOM are normal. Pupils are equal, round, and reactive to light.  Neck: Normal range of motion. Neck supple.  Cardiovascular: Normal rate, regular rhythm, normal heart sounds and intact distal pulses.  Exam reveals no gallop and no friction rub.   No murmur heard. Pulmonary/Chest: Effort normal and breath sounds normal. No respiratory distress. She has no wheezes.  Abdominal: Soft. Bowel sounds are normal. There is no tenderness. There is no rebound and no guarding.  Musculoskeletal: Normal range of motion. She exhibits no edema and no tenderness.  Neurological: She is alert and oriented to person, place, and time. No cranial nerve deficit or sensory deficit. She exhibits normal muscle tone. Gait abnormal. Coordination normal. GCS eye subscore is 4. GCS verbal subscore is 5. GCS motor subscore is 6.       rhomberg positive. Unsteady gait. Normal finger to nose testing.   Skin: Skin is warm and dry. No rash noted. She is not diaphoretic.  Psychiatric: She has a normal mood and affect. Her behavior is normal. Judgment and thought content normal.    ED Course  Procedures (including critical care time)  Patient has low stroke scale, low IHSS, improving. tpa was not given. D/w dr. Manus Gunning. Patient seen and evaluated.  VSS reviewed. . Nursing notes reviewed. Discussed with attending physician. Initial testing ordered. Will monitor the patient closely. They agree with the treatment plan and diagnosis.   Results for orders placed during the hospital encounter of 12/19/10  CBC      Component Value Range   WBC 9.6  4.0 - 10.5 (K/uL)   RBC 4.84  3.87 - 5.11 (MIL/uL)   Hemoglobin 14.8  12.0 - 15.0 (g/dL)   HCT 95.2  84.1 - 32.4 (%)   MCV 88.0  78.0 - 100.0 (fL)   MCH 30.6  26.0 - 34.0 (pg)   MCHC 34.7  30.0 - 36.0  (g/dL)   RDW 40.1  02.7 - 25.3 (%)   Platelets 230  150 - 400 (K/uL)  DIFFERENTIAL      Component Value Range   Neutrophils Relative 67  43 - 77 (%)   Neutro Abs 6.5  1.7 - 7.7 (K/uL)   Lymphocytes Relative 22  12 - 46 (%)   Lymphs Abs 2.1  0.7 - 4.0 (K/uL)   Monocytes Relative 8  3 - 12 (%)   Monocytes Absolute 0.7  0.1 - 1.0 (K/uL)   Eosinophils Relative 3  0 - 5 (%)   Eosinophils Absolute 0.3  0.0 - 0.7 (K/uL)   Basophils Relative 0  0 - 1 (%)   Basophils Absolute 0.0  0.0 - 0.1 (K/uL)  BASIC METABOLIC PANEL      Component Value Range   Sodium 141  135 - 145 (mEq/L)   Potassium 3.0 (*) 3.5 - 5.1 (mEq/L)   Chloride 104  96 - 112 (mEq/L)   CO2 25  19 - 32 (mEq/L)   Glucose, Bld 127 (*) 70 - 99 (mg/dL)   BUN 23  6 - 23 (mg/dL)   Creatinine, Ser 0.98  0.50 - 1.10 (mg/dL)   Calcium 9.5  8.4 - 11.9 (mg/dL)   GFR calc non Af Amer 61 (*) >90 (mL/min)   GFR calc Af Amer 71 (*) >90 (mL/min)  URINALYSIS, ROUTINE W REFLEX MICROSCOPIC      Component Value Range   Color, Urine YELLOW  YELLOW    APPearance CLEAR  CLEAR    Specific Gravity, Urine 1.015  1.005 - 1.030    pH 5.0  5.0 - 8.0    Glucose, UA NEGATIVE  NEGATIVE (mg/dL)   Hgb urine dipstick NEGATIVE  NEGATIVE    Bilirubin Urine NEGATIVE  NEGATIVE    Ketones, ur NEGATIVE  NEGATIVE (mg/dL)   Protein, ur NEGATIVE  NEGATIVE (mg/dL)   Urobilinogen, UA 0.2  0.0 - 1.0 (mg/dL)   Nitrite NEGATIVE  NEGATIVE    Leukocytes, UA SMALL (*) NEGATIVE   TROPONIN I      Component Value Range   Troponin I <0.30  <0.30 (ng/mL)  GLUCOSE, CAPILLARY      Component Value Range   Glucose-Capillary 128 (*) 70 - 99 (mg/dL)  URINE MICROSCOPIC-ADD ON      Component Value Range   Squamous Epithelial / LPF MANY (*) RARE    WBC, UA 3-6  <3 (WBC/hpf)   Bacteria, UA RARE  RARE    Ct Head Wo Contrast  12/19/2010  *RADIOLOGY REPORT*  Clinical Data: Dizziness.  CT HEAD WITHOUT CONTRAST  Technique:  Contiguous axial images were obtained from the base of  the skull through the vertex without contrast.  Comparison: Brain MRI 09/25/2010.  Findings: The ventricles are normal.  No extra-axial fluid collections are seen.  The brainstem and cerebellum are unremarkable.  No acute intracranial findings such as infarction or hemorrhage.  No mass lesions. There are a few small patchy white matter lesions which are better demonstrated on the prior MRI.  The bony calvarium is intact.  The visualized paranasal sinuses and mastoid air cells are clear.  IMPRESSION: No acute intracranial findings or mass lesions.  Minimal patchy white matter lesions are noted.  Original Report Authenticated By: P. Loralie Champagne, M.D.   Mr Angiogram Head Wo Contrast  12/19/2010  *RADIOLOGY REPORT*  Clinical Data:  Episode of severe dizziness.  Hypertensive in emergency room. Past history of angioplasty for symptomatic basilar artery stenosis.  MRI HEAD WITH AND WITHOUT CONTRAST MRA HEAD WITHOUT CONTRAST MRA NECK WITHOUT AND WITH CONTRAST  Technique: Multiplanar, multiecho pulse sequences of the brain and surrounding structures were obtained according to standard protocol with and without intravenous contrast.  Angiographic images of the Circle of Willis were obtained using MRA technique without intravenous contrast.  Angiographic images of the neck were obtained using MRA technique without and with intravenous contrast.  Contrast:20 ml MultiHance.  Comparison:12/19/2010 CT.  09/25/2010 MR.  MRI  HEAD  Findings: Questionable tiny acute non hemorrhagic infarct anterior left frontal lobe (series 3 image 14).  No intracranial hemorrhage.  Remote small left corona radiata infarct.  Mild small vessel disease type changes.  Slightly asymmetric ventricles unchanged without evidence of hydrocephalus.  No intracranial mass or abnormal enhancement.  Mild polypoid opacification inferior aspect of the left maxillary sinus.  IMPRESSION: Questionable tiny acute non hemorrhagic infarct anterior left frontal lobe  (series 3 image 14).  Remote small left corona radiata infarct.  Mild small vessel disease type changes.  Mild polypoid opacification inferior aspect of the left maxillary sinus.  MRA HEAD  Findings:Left internal carotid artery is occluded.  Reconstitution of flow cavernous segment.  Mild bulge of the supraclinoid aspect of the left internal carotid artery.  Bulge at the anterior communicating artery level appears to be an origin of a vessel.  Mild narrowing A1 segment right anterior cerebral artery.  Middle cerebral artery branch vessel irregularity bilaterally.  46% diameter stenosis mid aspect of the basilar artery without change.  The right vertebral artery is dominant.  Poor delineation of the right PICA with small left PICA.  Mild irregularity and narrowing the left AICA.  Posterior cerebral artery branch vessel irregularity.  No discrete aneurysm.  IMPRESSION: No significant change in appearance of occluded left internal carotid artery or narrowing of the mid basilar artery as detailed above.  MRA NECK  Findings:Left carotid artery is occluded at the left common carotid artery origin.  There is reconstitution of flow of the left internal carotid artery via collaterals.  This is more flow than is seen on the MR angiogram of the circle Willis.  Mild narrowing of innominate artery.  Mild narrowing proximal right common carotid artery.  No hemodynamically significant stenosis at the right carotid bifurcation.  Ectatic vertical cervical segment of the right internal carotid artery.  Mild narrowing of proximal right subclavian artery.  Right vertebral artery is dominant in size.  Mild to slightly moderate narrowing proximal left vertebral artery. This is common area for artifactual loss of signal mimicking stenosis.  Focal loss of signal of the left vertebral artery 2.8 cm above its origin.  Question artifact versus stenosis.  Ectatic cervical segment of the vertebral arteries bilaterally.  Mild narrowing distal  left vertebral artery.  IMPRESSION: Left carotid artery is occluded at the left common carotid artery origin.  There is reconstitution of flow of the left internal carotid artery via collaterals.  This is more flow than is seen on the MR angiogram of the circle Willis.  No hemodynamically significant stenosis at the right carotid bifurcation.  Right vertebral artery is dominant in size.  Mild to slightly moderate narrowing proximal left vertebral artery. This is common area for artifactual loss of signal mimicking stenosis.  Original Report Authenticated By: Fuller Canada, M.D.   Mr Angiogram Neck W Wo Contrast  12/19/2010  *RADIOLOGY REPORT*  Clinical Data:  Episode of severe dizziness.  Hypertensive in emergency room. Past history of angioplasty for symptomatic basilar artery stenosis.  MRI HEAD WITH AND WITHOUT CONTRAST MRA HEAD WITHOUT CONTRAST MRA NECK WITHOUT AND WITH CONTRAST  Technique: Multiplanar, multiecho pulse sequences of the brain and surrounding structures were obtained according to standard protocol with and without intravenous contrast.  Angiographic images of the Circle of Willis were obtained using MRA technique without intravenous contrast.  Angiographic images of the neck were obtained using MRA technique without and with intravenous contrast.  Contrast:20 ml MultiHance.  Comparison:12/19/2010 CT.  09/25/2010 MR.  MRI HEAD  Findings: Questionable tiny acute non hemorrhagic infarct anterior left frontal lobe (series 3 image 14).  No intracranial hemorrhage.  Remote small left corona radiata infarct.  Mild small vessel disease type changes.  Slightly asymmetric ventricles unchanged without evidence of hydrocephalus.  No intracranial mass or abnormal enhancement.  Mild polypoid opacification inferior aspect of the left maxillary sinus.  IMPRESSION: Questionable tiny acute non hemorrhagic infarct anterior left frontal lobe (series 3 image 14).  Remote small left corona radiata infarct.  Mild  small vessel disease type changes.  Mild polypoid opacification inferior aspect of the left maxillary sinus.  MRA HEAD  Findings:Left internal carotid artery is occluded.  Reconstitution of flow cavernous segment.  Mild bulge of the supraclinoid aspect of the left internal carotid artery.  Bulge at the anterior communicating artery level appears to be an origin of a vessel.  Mild narrowing A1 segment right anterior cerebral artery.  Middle cerebral artery branch vessel irregularity bilaterally.  46% diameter stenosis mid aspect of the basilar artery without change.  The right vertebral artery is dominant.  Poor delineation of the right PICA with small left PICA.  Mild irregularity and narrowing the left AICA.  Posterior cerebral artery branch vessel irregularity.  No discrete aneurysm.  IMPRESSION: No significant change in appearance of occluded left internal carotid artery or narrowing of the mid basilar artery as detailed above.  MRA NECK  Findings:Left carotid artery is occluded at the left common carotid artery origin.  There is reconstitution of flow of the left internal carotid artery via collaterals.  This is more flow than is seen on the MR angiogram of the circle Willis.  Mild narrowing of innominate artery.  Mild narrowing proximal right common carotid artery.  No hemodynamically significant stenosis at the right carotid bifurcation.  Ectatic vertical cervical segment of the right internal carotid artery.  Mild narrowing of proximal right subclavian artery.  Right vertebral artery is dominant in size.  Mild to slightly moderate narrowing proximal left vertebral artery. This is common area for artifactual loss of signal mimicking stenosis.  Focal loss of signal of the left vertebral artery 2.8 cm above its origin.  Question artifact versus stenosis.  Ectatic cervical segment of the vertebral arteries bilaterally.  Mild narrowing distal left vertebral artery.  IMPRESSION: Left carotid artery is occluded at  the left common carotid artery origin.  There is reconstitution of flow of the left internal carotid artery via collaterals.  This is more flow than is seen on the MR angiogram of the circle Willis.  No hemodynamically significant stenosis at the right carotid bifurcation.  Right vertebral artery is dominant in size.  Mild to slightly moderate narrowing proximal left vertebral artery. This is common area for artifactual loss of signal mimicking stenosis.  Original Report Authenticated By: Fuller Canada, M.D.   Mr Laqueta Jean Wo Contrast  12/19/2010  *RADIOLOGY REPORT*  Clinical Data:  Episode of severe dizziness.  Hypertensive in emergency room. Past history of angioplasty for symptomatic basilar artery stenosis.  MRI HEAD WITH AND WITHOUT CONTRAST MRA HEAD WITHOUT CONTRAST MRA NECK WITHOUT AND WITH CONTRAST  Technique: Multiplanar, multiecho pulse sequences of the brain and surrounding structures were obtained according to standard protocol with and without intravenous contrast.  Angiographic images of the Circle of Willis were obtained using MRA technique without intravenous contrast.  Angiographic images of the neck were obtained using MRA technique without and with intravenous contrast.  Contrast:20 ml MultiHance.  Comparison:12/19/2010 CT.  09/25/2010 MR.  MRI HEAD  Findings: Questionable tiny acute non hemorrhagic infarct anterior left frontal lobe (series 3 image 14).  No intracranial hemorrhage.  Remote small left corona radiata infarct.  Mild small vessel disease type changes.  Slightly asymmetric ventricles unchanged without evidence of hydrocephalus.  No intracranial mass or abnormal enhancement.  Mild polypoid opacification inferior aspect of the left maxillary sinus.  IMPRESSION: Questionable tiny acute non hemorrhagic infarct anterior left frontal lobe (series 3 image 14).  Remote small left corona radiata infarct.  Mild small vessel disease type changes.  Mild polypoid opacification inferior aspect  of the left maxillary sinus.  MRA HEAD  Findings:Left internal carotid artery is occluded.  Reconstitution of flow cavernous segment.  Mild bulge of the supraclinoid aspect of the left internal carotid artery.  Bulge at the anterior communicating artery level appears to be an origin of a vessel.  Mild narrowing A1 segment right anterior cerebral artery.  Middle cerebral artery branch vessel irregularity bilaterally.  46% diameter stenosis mid aspect of the basilar artery without change.  The right vertebral artery is dominant.  Poor delineation of the right PICA with small left PICA.  Mild irregularity and narrowing the left AICA.  Posterior cerebral artery branch vessel irregularity.  No discrete aneurysm.  IMPRESSION: No significant change in appearance of occluded left internal carotid artery or narrowing of the mid basilar artery as detailed above.  MRA NECK  Findings:Left carotid artery is occluded at the left common carotid artery origin.  There is reconstitution of flow of the left internal carotid artery via collaterals.  This is more flow than is seen on the MR angiogram of the circle Willis.  Mild narrowing of innominate artery.  Mild narrowing proximal right common carotid artery.  No hemodynamically significant stenosis at the right carotid bifurcation.  Ectatic vertical cervical segment of the right internal carotid artery.  Mild narrowing of proximal right subclavian artery.  Right vertebral artery is dominant in size.  Mild to slightly moderate narrowing proximal left vertebral artery. This is common area for artifactual loss of signal mimicking stenosis.  Focal loss of signal of the left vertebral artery 2.8 cm above its origin.  Question artifact versus stenosis.  Ectatic cervical segment of the vertebral arteries bilaterally.  Mild narrowing distal left vertebral artery.  IMPRESSION: Left carotid artery is occluded at the left common carotid artery origin.  There is reconstitution of flow of the  left internal carotid artery via collaterals.  This is more flow than is seen on the MR angiogram of the circle Willis.  No hemodynamically significant stenosis at the right carotid bifurcation.  Right vertebral artery is dominant in size.  Mild to slightly moderate narrowing proximal left vertebral artery. This is common area for artifactual loss of signal mimicking stenosis.  Original Report Authenticated By: Fuller Canada, M.D.    Patient seen and re-evaluated. Resting comfortably. VSS stable. NAD. Patient notified of testing results. Stated agreement and understanding. Patient stated understanding to treatment plan and diagnosis.   10:11 PM discussed patient with dr. Thad Ranger who stated that they will come see the patient in the emergency department.    Date: 12/19/2010, 1755  Rate: 91  Rhythm: normal sinus rhythm  QRS Axis: normal  Intervals: PR prolonged  ST/T Wave abnormalities: nonspecific ST/T changes  Conduction Disutrbances:first-degree A-V block   Narrative Interpretation:   Old EKG Reviewed: no significant changes 07/20/09    MDM  Dizziness  Demetrius Charity, PA 12/19/10 2311  Demetrius Charity, PA 12/19/10 [phone]

## 2010-12-19 NOTE — ED Provider Notes (Signed)
Medical screening examination/treatment/procedure(s) were conducted as a shared visit with non-physician practitioner(s) and myself.  I personally evaluated the patient during the encounter  Sudden onset dizziness, unsteadiness.  Hx carotid occlusion and basilar artery stenosis.  No facial asymmetry, no ataxia on finger to nose. Head impulse testing negative.  No nystagmus, test of skew neg.  Glynn Octave, MD 12/19/10 [phone]

## 2010-12-19 NOTE — ED Notes (Signed)
Patient transported to CT 

## 2010-12-19 NOTE — H&P (Addendum)
DATE OF ADMISSION:  12/19/2010  PCP:    Crawford Givens, MD, MD   Chief Complaint: Dizziness   HPI: Kelsey Dunn is an 70 y.o. female  Presented to the ER with complaints of feeling as if she would pass out when she tried to raise up from her recliner at around 445 pm.  She stated that she felt dizzy and had  tingling all over and was about to black out and she called to her husband to call EMS because she felt as if she was dying.  She denied having headache or chest pain or palpitations.  She has a known Basilar Artery Stenosis Left Vertebral Artery and Left Internal Carotid artery occlusion diagnosed in July 2010 and is s/p Angioplasty by Dr. Sharmon Revere of Interventional Radiology.     She was evaluated in the ED and had a CT scan and MRI performed and was found to have an acute infarct of the Left Anterior Frontal Lobe.  Her blood Pressure was elevated.    Past Medical History  Diagnosis Date  . Hypothyroidism 1990  . Labile hypertension 08/1991  . HLD (hyperlipidemia) 01/1997  . Fibromyalgia   . Bladder spasms   . Basilar artery stenosis     90%  . CAD (coronary artery disease)     severe stenosis RCA with placement drug eluting stent   . IC (interstitial cystitis)     Past Surgical History  Procedure Date  . Bladder suspension 1974  . Total abdominal hysterectomy 1974    dysmennorhea  . Cholecystectomy 1985  . Cardiac catheterization 1993    normal  . Renal angioplasty 1993    normal  . Carotid US 11/93    Occl L comm carotid  . Visual evoked response 11/93; 1/01    normal  . US renal/aorta 12/93    Normal  . Emg 1/01    LE, RUE normal  . Carotid US 4/98    L comm occl R-ok  . Carotid US 01/30/03    L occl  R less 40%  . Dexa 10/15/04    Troch -0.4 o/w pos  . Carotid arteriogram     bilat  . Bilateral vert & subclavian angiogram 07/19/08    85-90% stenosis mid basilar art; occluded left vert artery; occluded left comm carotid artery w/ collat flow  . Basilar  artery angioplasty 09/04/08    sm right brachial hematoma  . Cath single vess dz 06/13/09    70% stenosis RCA EF 65-70%  . Coronary angioplasty with stent placement 06/19/09    Medications:  HOME MEDS: Prior to Admission medications   Medication Sig Start Date End Date Taking? Authorizing Provider  aspirin EC 325 MG tablet Take 325 mg by mouth daily.     Yes Historical Provider, MD  carvedilol (COREG) 6.25 MG tablet Take 6.25 mg by mouth 2 (two) times daily.     Yes Historical Provider, MD  clopidogrel (PLAVIX) 75 MG tablet Take 75 mg by mouth daily.    Yes Historical Provider, MD  lansoprazole (PREVACID) 15 MG capsule Take 15 mg by mouth daily as needed. For heartburn   Yes Historical Provider, MD  levothyroxine (SYNTHROID, LEVOTHROID) 100 MCG tablet Take 100 mcg by mouth daily.   08/08/10  Yes Joaquim Nam, MD  losartan-hydrochlorothiazide [facility]) 100-25 MG per tablet   08/13/10  Yes Joaquim Nam, MD  nitroGLYCERIN (NITROSTAT) 0.4 MG SL tablet Place 0.4 mg under the tongue every 5 (five) minutes  as needed. For chest pain   Yes Historical Provider, MD    Allergies:  Allergies  Allergen Reactions  . Anesthetics, Amide     REACTION:FEVER, SYNCOPE  . Codeine Nausea And Vomiting    Reaction:Hallucinations  . Nitrofurantoin Nausea Only    REACTION: Hallucinations  . Orphenadrine Citrate Nausea And Vomiting    REACTION: Hallucination  . Pentosan Polysulfate Sodium     REACTION: rash/ severe headache  . Statins     myalgias    Social History:   reports that she has never smoked. She does not have any smokeless tobacco history on file. She reports that she does not drink alcohol or use illicit drugs.  Family History: Family History  Problem Relation Age of Onset  . Lung cancer Father   . Heart attack Mother   . Lymphoma Mother     s/p chemo  . Stroke Mother     x 2  . Diabetes Brother     Review of Systems:  The patient denies anorexia, fever, weight loss, vision loss,  decreased hearing, hoarseness, chest pain, syncope, dyspnea on exertion, peripheral edema, balance deficits, hemoptysis, abdominal pain, melena, hematochezia, severe indigestion/heartburn, hematuria, incontinence, genital sores, muscle weakness, suspicious skin lesions, transient blindness, difficulty walking, depression, unusual weight change, abnormal bleeding, enlarged lymph nodes, angioedema.   Physical Exam:  GEN: Pleasant 70 year old obese Caucasian female examined  and in no acute distress; cooperative with exam Filed Vitals:   12/19/10 1856 12/19/10 2138 12/19/10 2226 12/19/10 2247  BP: 193/81 159/71 174/96 176/75  Pulse: 89 67 76 77  Temp:    97.7 F (36.5 C)  TempSrc:    Oral  Resp: 18     SpO2: 97% 95% 96% 100%   Blood pressure 176/75, pulse 77, temperature 97.7 F (36.5 C), temperature source Oral, resp. rate 18, SpO2 100.00%. PSYCH: SHe is alert and oriented x4; does not appear anxious does not appear depressed; affect is normal HEENT: Normocephalic and Atraumatic, Mucous membranes pink; PERRLA; EOM intact; Fundi:  Benign;  No scleral icterus, Nares: Patent, Oropharynx: Clear, Fair Dentition, Neck:  FROM, no cervical lymphadenopathy nor thyromegaly or carotid bruit; no JVD; Breasts:: Not examined CHEST WALL: No tenderness CHEST: Normal respiration, clear to auscultation bilaterally HEART: Regular rate and rhythm; no murmurs rubs or gallops BACK: No kyphosis or scoliosis; no CVA tenderness ABDOMEN: Positive Bowel Sounds, Obese, soft non-tender; no masses, no organomegaly.   Rectal Exam: Not done EXTREMITIES: No bone or joint deformity; age-appropriate arthropathy of the hands and knees; no cyanosis, clubbing or edema; no ulcerations. Genitalia: not examined PULSES: 2+ and symmetric SKIN: Normal hydration no rash or ulceration CNS: Cranial nerves 2-12 grossly intact no focal neurologic deficit   Labs & Imaging Results for orders placed during the hospital encounter of  12/19/10 (from the past 48 hour(s))  URINALYSIS, ROUTINE W REFLEX MICROSCOPIC     Status: Abnormal   Collection Time   12/19/10  6:02 PM      Component Value Range Comment   Color, Urine YELLOW  YELLOW     APPearance CLEAR  CLEAR     Specific Gravity, Urine 1.015  1.005 - 1.030     pH 5.0  5.0 - 8.0     Glucose, UA NEGATIVE  NEGATIVE (mg/dL)    Hgb urine dipstick NEGATIVE  NEGATIVE     Bilirubin Urine NEGATIVE  NEGATIVE     Ketones, ur NEGATIVE  NEGATIVE (mg/dL)    Protein, ur NEGATIVE  NEGATIVE (mg/dL)    Urobilinogen, UA 0.2  0.0 - 1.0 (mg/dL)    Nitrite NEGATIVE  NEGATIVE     Leukocytes, UA SMALL (*) NEGATIVE    URINE MICROSCOPIC-ADD ON     Status: Abnormal   Collection Time   12/19/10  6:02 PM      Component Value Range Comment   Squamous Epithelial / LPF MANY (*) RARE     WBC, UA 3-6  <3 (WBC/hpf)    Bacteria, UA RARE  RARE    CBC     Status: Normal   Collection Time   12/19/10  6:09 PM      Component Value Range Comment   WBC 9.6  4.0 - 10.5 (K/uL)    RBC 4.84  3.87 - 5.11 (MIL/uL)    Hemoglobin 14.8  12.0 - 15.0 (g/dL)    HCT 16.1  09.6 - 04.5 (%)    MCV 88.0  78.0 - 100.0 (fL)    MCH 30.6  26.0 - 34.0 (pg)    MCHC 34.7  30.0 - 36.0 (g/dL)    RDW 40.9  81.1 - 91.4 (%)    Platelets 230  150 - 400 (K/uL)   DIFFERENTIAL     Status: Normal   Collection Time   12/19/10  6:09 PM      Component Value Range Comment   Neutrophils Relative 67  43 - 77 (%)    Neutro Abs 6.5  1.7 - 7.7 (K/uL)    Lymphocytes Relative 22  12 - 46 (%)    Lymphs Abs 2.1  0.7 - 4.0 (K/uL)    Monocytes Relative 8  3 - 12 (%)    Monocytes Absolute 0.7  0.1 - 1.0 (K/uL)    Eosinophils Relative 3  0 - 5 (%)    Eosinophils Absolute 0.3  0.0 - 0.7 (K/uL)    Basophils Relative 0  0 - 1 (%)    Basophils Absolute 0.0  0.0 - 0.1 (K/uL)   BASIC METABOLIC PANEL     Status: Abnormal   Collection Time   12/19/10  6:09 PM      Component Value Range Comment   Sodium 141  135 - 145 (mEq/L)    Potassium  3.0 (*) 3.5 - 5.1 (mEq/L)    Chloride 104  96 - 112 (mEq/L)    CO2 25  19 - 32 (mEq/L)    Glucose, Bld 127 (*) 70 - 99 (mg/dL)    BUN 23  6 - 23 (mg/dL)    Creatinine, Ser 7.82  0.50 - 1.10 (mg/dL)    Calcium 9.5  8.4 - 10.5 (mg/dL)    GFR calc non Af Amer 61 (*) >90 (mL/min)    GFR calc Af Amer 71 (*) >90 (mL/min)   TROPONIN I     Status: Normal   Collection Time   12/19/10  6:13 PM      Component Value Range Comment   Troponin I <0.30  <0.30 (ng/mL)   GLUCOSE, CAPILLARY     Status: Abnormal   Collection Time   12/19/10  6:24 PM      Component Value Range Comment   Glucose-Capillary 128 (*) 70 - 99 (mg/dL)    Ct Head Wo Contrast  12/19/2010  *RADIOLOGY REPORT*  Clinical Data: Dizziness.  CT HEAD WITHOUT CONTRAST  Technique:  Contiguous axial images were obtained from the base of the skull through the vertex without contrast.  Comparison: Brain MRI 09/25/2010.  Findings: The  ventricles are normal.  No extra-axial fluid collections are seen.  The brainstem and cerebellum are unremarkable.  No acute intracranial findings such as infarction or hemorrhage.  No mass lesions. There are a few small patchy white matter lesions which are better demonstrated on the prior MRI.  The bony calvarium is intact.  The visualized paranasal sinuses and mastoid air cells are clear.  IMPRESSION: No acute intracranial findings or mass lesions.  Minimal patchy white matter lesions are noted.  Original Report Authenticated By: P. Loralie Champagne, M.D.   Mr Angiogram Head Wo Contrast  12/19/2010  *RADIOLOGY REPORT*  Clinical Data:  Episode of severe dizziness.  Hypertensive in emergency room. Past history of angioplasty for symptomatic basilar artery stenosis.  MRI HEAD WITH AND WITHOUT CONTRAST MRA HEAD WITHOUT CONTRAST MRA NECK WITHOUT AND WITH CONTRAST  Technique: Multiplanar, multiecho pulse sequences of the brain and surrounding structures were obtained according to standard protocol with and without  intravenous contrast.  Angiographic images of the Circle of Willis were obtained using MRA technique without intravenous contrast.  Angiographic images of the neck were obtained using MRA technique without and with intravenous contrast.  Contrast:20 ml MultiHance.  Comparison:12/19/2010 CT.  09/25/2010 MR.  MRI HEAD  Findings: Questionable tiny acute non hemorrhagic infarct anterior left frontal lobe (series 3 image 14).  No intracranial hemorrhage.  Remote small left corona radiata infarct.  Mild small vessel disease type changes.  Slightly asymmetric ventricles unchanged without evidence of hydrocephalus.  No intracranial mass or abnormal enhancement.  Mild polypoid opacification inferior aspect of the left maxillary sinus.  IMPRESSION: Questionable tiny acute non hemorrhagic infarct anterior left frontal lobe (series 3 image 14).  Remote small left corona radiata infarct.  Mild small vessel disease type changes.  Mild polypoid opacification inferior aspect of the left maxillary sinus.  MRA HEAD  Findings:Left internal carotid artery is occluded.  Reconstitution of flow cavernous segment.  Mild bulge of the supraclinoid aspect of the left internal carotid artery.  Bulge at the anterior communicating artery level appears to be an origin of a vessel.  Mild narrowing A1 segment right anterior cerebral artery.  Middle cerebral artery branch vessel irregularity bilaterally.  46% diameter stenosis mid aspect of the basilar artery without change.  The right vertebral artery is dominant.  Poor delineation of the right PICA with small left PICA.  Mild irregularity and narrowing the left AICA.  Posterior cerebral artery branch vessel irregularity.  No discrete aneurysm.  IMPRESSION: No significant change in appearance of occluded left internal carotid artery or narrowing of the mid basilar artery as detailed above.  MRA NECK  Findings:Left carotid artery is occluded at the left common carotid artery origin.  There is  reconstitution of flow of the left internal carotid artery via collaterals.  This is more flow than is seen on the MR angiogram of the circle Willis.  Mild narrowing of innominate artery.  Mild narrowing proximal right common carotid artery.  No hemodynamically significant stenosis at the right carotid bifurcation.  Ectatic vertical cervical segment of the right internal carotid artery.  Mild narrowing of proximal right subclavian artery.  Right vertebral artery is dominant in size.  Mild to slightly moderate narrowing proximal left vertebral artery. This is common area for artifactual loss of signal mimicking stenosis.  Focal loss of signal of the left vertebral artery 2.8 cm above its origin.  Question artifact versus stenosis.  Ectatic cervical segment of the vertebral arteries bilaterally.  Mild narrowing distal left  vertebral artery.  IMPRESSION: Left carotid artery is occluded at the left common carotid artery origin.  There is reconstitution of flow of the left internal carotid artery via collaterals.  This is more flow than is seen on the MR angiogram of the circle Willis.  No hemodynamically significant stenosis at the right carotid bifurcation.  Right vertebral artery is dominant in size.  Mild to slightly moderate narrowing proximal left vertebral artery. This is common area for artifactual loss of signal mimicking stenosis.  Original Report Authenticated By: Fuller Canada, M.D.   Mr Angiogram Neck W Wo Contrast  12/19/2010  *RADIOLOGY REPORT*  Clinical Data:  Episode of severe dizziness.  Hypertensive in emergency room. Past history of angioplasty for symptomatic basilar artery stenosis.  MRI HEAD WITH AND WITHOUT CONTRAST MRA HEAD WITHOUT CONTRAST MRA NECK WITHOUT AND WITH CONTRAST  Technique: Multiplanar, multiecho pulse sequences of the brain and surrounding structures were obtained according to standard protocol with and without intravenous contrast.  Angiographic images of the Circle of Willis  were obtained using MRA technique without intravenous contrast.  Angiographic images of the neck were obtained using MRA technique without and with intravenous contrast.  Contrast:20 ml MultiHance.  Comparison:12/19/2010 CT.  09/25/2010 MR.  MRI HEAD  Findings: Questionable tiny acute non hemorrhagic infarct anterior left frontal lobe (series 3 image 14).  No intracranial hemorrhage.  Remote small left corona radiata infarct.  Mild small vessel disease type changes.  Slightly asymmetric ventricles unchanged without evidence of hydrocephalus.  No intracranial mass or abnormal enhancement.  Mild polypoid opacification inferior aspect of the left maxillary sinus.  IMPRESSION: Questionable tiny acute non hemorrhagic infarct anterior left frontal lobe (series 3 image 14).  Remote small left corona radiata infarct.  Mild small vessel disease type changes.  Mild polypoid opacification inferior aspect of the left maxillary sinus.  MRA HEAD  Findings:Left internal carotid artery is occluded.  Reconstitution of flow cavernous segment.  Mild bulge of the supraclinoid aspect of the left internal carotid artery.  Bulge at the anterior communicating artery level appears to be an origin of a vessel.  Mild narrowing A1 segment right anterior cerebral artery.  Middle cerebral artery branch vessel irregularity bilaterally.  46% diameter stenosis mid aspect of the basilar artery without change.  The right vertebral artery is dominant.  Poor delineation of the right PICA with small left PICA.  Mild irregularity and narrowing the left AICA.  Posterior cerebral artery branch vessel irregularity.  No discrete aneurysm.  IMPRESSION: No significant change in appearance of occluded left internal carotid artery or narrowing of the mid basilar artery as detailed above.  MRA NECK  Findings:Left carotid artery is occluded at the left common carotid artery origin.  There is reconstitution of flow of the left internal carotid artery via  collaterals.  This is more flow than is seen on the MR angiogram of the circle Willis.  Mild narrowing of innominate artery.  Mild narrowing proximal right common carotid artery.  No hemodynamically significant stenosis at the right carotid bifurcation.  Ectatic vertical cervical segment of the right internal carotid artery.  Mild narrowing of proximal right subclavian artery.  Right vertebral artery is dominant in size.  Mild to slightly moderate narrowing proximal left vertebral artery. This is common area for artifactual loss of signal mimicking stenosis.  Focal loss of signal of the left vertebral artery 2.8 cm above its origin.  Question artifact versus stenosis.  Ectatic cervical segment of the vertebral arteries bilaterally.  Mild narrowing distal left vertebral artery.  IMPRESSION: Left carotid artery is occluded at the left common carotid artery origin.  There is reconstitution of flow of the left internal carotid artery via collaterals.  This is more flow than is seen on the MR angiogram of the circle Willis.  No hemodynamically significant stenosis at the right carotid bifurcation.  Right vertebral artery is dominant in size.  Mild to slightly moderate narrowing proximal left vertebral artery. This is common area for artifactual loss of signal mimicking stenosis.  Original Report Authenticated By: Fuller Canada, M.D.   Mr Laqueta Jean Wo Contrast  12/19/2010  *RADIOLOGY REPORT*  Clinical Data:  Episode of severe dizziness.  Hypertensive in emergency room. Past history of angioplasty for symptomatic basilar artery stenosis.  MRI HEAD WITH AND WITHOUT CONTRAST MRA HEAD WITHOUT CONTRAST MRA NECK WITHOUT AND WITH CONTRAST  Technique: Multiplanar, multiecho pulse sequences of the brain and surrounding structures were obtained according to standard protocol with and without intravenous contrast.  Angiographic images of the Circle of Willis were obtained using MRA technique without intravenous contrast.   Angiographic images of the neck were obtained using MRA technique without and with intravenous contrast.  Contrast:20 ml MultiHance.  Comparison:12/19/2010 CT.  09/25/2010 MR.  MRI HEAD  Findings: Questionable tiny acute non hemorrhagic infarct anterior left frontal lobe (series 3 image 14).  No intracranial hemorrhage.  Remote small left corona radiata infarct.  Mild small vessel disease type changes.  Slightly asymmetric ventricles unchanged without evidence of hydrocephalus.  No intracranial mass or abnormal enhancement.  Mild polypoid opacification inferior aspect of the left maxillary sinus.  IMPRESSION: Questionable tiny acute non hemorrhagic infarct anterior left frontal lobe (series 3 image 14).  Remote small left corona radiata infarct.  Mild small vessel disease type changes.  Mild polypoid opacification inferior aspect of the left maxillary sinus.  MRA HEAD  Findings:Left internal carotid artery is occluded.  Reconstitution of flow cavernous segment.  Mild bulge of the supraclinoid aspect of the left internal carotid artery.  Bulge at the anterior communicating artery level appears to be an origin of a vessel.  Mild narrowing A1 segment right anterior cerebral artery.  Middle cerebral artery branch vessel irregularity bilaterally.  46% diameter stenosis mid aspect of the basilar artery without change.  The right vertebral artery is dominant.  Poor delineation of the right PICA with small left PICA.  Mild irregularity and narrowing the left AICA.  Posterior cerebral artery branch vessel irregularity.  No discrete aneurysm.  IMPRESSION: No significant change in appearance of occluded left internal carotid artery or narrowing of the mid basilar artery as detailed above.  MRA NECK  Findings:Left carotid artery is occluded at the left common carotid artery origin.  There is reconstitution of flow of the left internal carotid artery via collaterals.  This is more flow than is seen on the MR angiogram of the  circle Willis.  Mild narrowing of innominate artery.  Mild narrowing proximal right common carotid artery.  No hemodynamically significant stenosis at the right carotid bifurcation.  Ectatic vertical cervical segment of the right internal carotid artery.  Mild narrowing of proximal right subclavian artery.  Right vertebral artery is dominant in size.  Mild to slightly moderate narrowing proximal left vertebral artery. This is common area for artifactual loss of signal mimicking stenosis.  Focal loss of signal of the left vertebral artery 2.8 cm above its origin.  Question artifact versus stenosis.  Ectatic cervical segment of the vertebral  arteries bilaterally.  Mild narrowing distal left vertebral artery.  IMPRESSION: Left carotid artery is occluded at the left common carotid artery origin.  There is reconstitution of flow of the left internal carotid artery via collaterals.  This is more flow than is seen on the MR angiogram of the circle Willis.  No hemodynamically significant stenosis at the right carotid bifurcation.  Right vertebral artery is dominant in size.  Mild to slightly moderate narrowing proximal left vertebral artery. This is common area for artifactual loss of signal mimicking stenosis.  Original Report Authenticated By: Fuller Canada, M.D.      Assessment/Plan:  1. Cerebrovascular Infarction (CVA)- seen on MRI, CVA workup initiated, carotid US study ordered, Neuro to see.   2.  Malignant HTN-  Adjust BP meds 3.  Presyncope-  Due to #1, and #2, Fall Precautions.   4.  Hypokalemia-  Replete K+, decreased due to combination HCTZ therapy.   5.  Other plans as per orders.    CODE STATUS:      FULL CODE          Gwyndolyn Guilford C 12/19/2010, 11:32 PM

## 2010-12-20 ENCOUNTER — Inpatient Hospital Stay (HOSPITAL_COMMUNITY): Payer: Medicare HMO

## 2010-12-20 ENCOUNTER — Emergency Department (HOSPITAL_COMMUNITY): Payer: Medicare HMO

## 2010-12-20 ENCOUNTER — Encounter (HOSPITAL_COMMUNITY): Payer: Self-pay | Admitting: *Deleted

## 2010-12-20 LAB — LIPID PANEL
HDL: 38 mg/dL — ABNORMAL LOW (ref >39)
LDL Cholesterol: 116 mg/dL — ABNORMAL HIGH (ref 0–99)

## 2010-12-20 LAB — HEMOGLOBIN A1C: Hgb A1c MFr Bld: 5.7 % — ABNORMAL HIGH (ref <5.7)

## 2010-12-20 MED ORDER — POTASSIUM CHLORIDE CRYS ER 20 MEQ PO TBCR
40.0000 meq | EXTENDED_RELEASE_TABLET | Freq: Once | ORAL | Status: AC
  Administered 2010-12-20: 40 meq via ORAL
  Filled 2010-12-20: qty 2

## 2010-12-20 MED ORDER — HYDRALAZINE HCL 20 MG/ML IJ SOLN
5.0000 mg | Freq: Four times a day (QID) | INTRAMUSCULAR | Status: DC | PRN
  Filled 2010-12-20: qty 1

## 2010-12-20 MED ORDER — LOSARTAN POTASSIUM 50 MG PO TABS
100.0000 mg | ORAL_TABLET | Freq: Every day | ORAL | Status: DC
  Administered 2010-12-20: 100 mg via ORAL
  Filled 2010-12-20: qty 2

## 2010-12-20 MED ORDER — ENOXAPARIN SODIUM 40 MG/0.4ML ~~LOC~~ SOLN
40.0000 mg | Freq: Every day | SUBCUTANEOUS | Status: DC
  Administered 2010-12-20 – 2010-12-22 (×3): 40 mg via SUBCUTANEOUS
  Filled 2010-12-20 (×3): qty 0.4

## 2010-12-20 MED ORDER — ASPIRIN 325 MG PO TABS
325.0000 mg | ORAL_TABLET | Freq: Every day | ORAL | Status: DC
  Administered 2010-12-20 – 2010-12-22 (×3): 325 mg via ORAL
  Filled 2010-12-20 (×3): qty 1

## 2010-12-20 MED ORDER — IOHEXOL 350 MG/ML SOLN
50.0000 mL | Freq: Once | INTRAVENOUS | Status: AC | PRN
  Administered 2010-12-20: 50 mL via INTRAVENOUS

## 2010-12-20 MED ORDER — ASPIRIN 300 MG RE SUPP
300.0000 mg | Freq: Every day | RECTAL | Status: DC
  Filled 2010-12-20 (×2): qty 1

## 2010-12-20 MED ORDER — HYDROCHLOROTHIAZIDE 25 MG PO TABS
25.0000 mg | ORAL_TABLET | Freq: Every day | ORAL | Status: DC
Start: 2010-12-20 — End: 2010-12-20
  Administered 2010-12-20: 25 mg via ORAL
  Filled 2010-12-20: qty 1

## 2010-12-20 NOTE — Progress Notes (Signed)
PROGRESS NOTE  MARRIE CHANDRA WJX:[id number] DOB: 06/29/1940 DOA: 12/19/2010 PCP: Crawford Givens, MD, MD  Brief narrative: 70 year old woman presents the emergency department with complaint of dizziness, visual changes and presyncope. Fleeting episode of chest pain. She was admitted for acute stroke, malignant hypertension, presyncope.  Past medical history: Basilar artery stenosis, left carotid artery occlusion, coronary artery disease status post stent placement RCA, interstitial cystitis, fibromyalgia, hypothyroidism  Consultants:  Neurology  Physical therapy:  Procedures:  None.  Antibiotics:  None.  Interim History: Neurology recommended CT angiogram of the head and neck, blood pressure control.  Subjective: Overall feels better. No focal neurologic deficits. Still feels better has "not quite right."  Objective: Filed Vitals:   12/20/10 0127 12/20/10 0259 12/20/10 0330 12/20/10 0530  BP: 197/80 131/68 144/49 152/68  Pulse: 68 95 75 62  Temp: 97.7 F (36.5 C)  97.7 F (36.5 C) 97.8 F (36.6 C)  TempSrc: Oral  Oral Oral  Resp: 20  16 24   Height: 5\' 2"  (1.575 m)     Weight: 96.435 kg (212 lb 9.6 oz)     SpO2: 95%  95% 95%   No intake or output data in the 24 hours ending 12/20/10 1022  Exam:  General: Appears well. Cardiovascular: Regular rate and rhythm. No murmur, rub or gallop. No lower extremity edema. Respiratory: Clear to auscultation bilaterally. No wheezes, rales or rhonchi. Normal respiratory effort. Neurologic: Face appears grossly normal. Extremities appear unremarkable.  Data Reviewed: Basic Metabolic Panel:  Lab 12/19/10 2130  NA 141  K 3.0*  CL 104  CO2 25  GLUCOSE 127*  BUN 23  CREATININE 0.94  CALCIUM 9.5  MG --  PHOS --   CBC:  Lab 12/19/10 1809  WBC 9.6  NEUTROABS 6.5  HGB 14.8  HCT 42.6  MCV 88.0  PLT 230   Cardiac Enzymes:  Lab 12/19/10 1813  CKTOTAL --  CKMB --  CKMBINDEX --  TROPONINI <0.30   CBG:  Lab  12/19/10 1824  GLUCAP 128*     Studies: Dg Chest 2 View  12/20/2010  *RADIOLOGY REPORT*  Clinical Data: Left CVA.  CHEST - 2 VIEW  Comparison: 06/10/2009  Findings: Limited by patient body habitus and hypoaeration.  Within this limitation, no focal area of consolidation identified.  No pleural effusion or pneumothorax.  Multilevel degenerative changes. No acute osseous abnormality.  Surgical clips right upper quadrant. Small hiatal hernia.  IMPRESSION: Interstitial and vascular crowding is likely due to technical limatations described above.  No definite focal consolidation.  Original Report Authenticated By: Waneta Martins, M.D.   Ct Head Wo Contrast  12/19/2010  *RADIOLOGY REPORT*  Clinical Data: Dizziness.  CT HEAD WITHOUT CONTRAST  Technique:  Contiguous axial images were obtained from the base of the skull through the vertex without contrast.  Comparison: Brain MRI 09/25/2010.  Findings: The ventricles are normal.  No extra-axial fluid collections are seen.  The brainstem and cerebellum are unremarkable.  No acute intracranial findings such as infarction or hemorrhage.  No mass lesions. There are a few small patchy white matter lesions which are better demonstrated on the prior MRI.  The bony calvarium is intact.  The visualized paranasal sinuses and mastoid air cells are clear.  IMPRESSION: No acute intracranial findings or mass lesions.  Minimal patchy white matter lesions are noted.  Original Report Authenticated By: P. Loralie Champagne, M.D.   Mr Angiogram Head Wo Contrast  12/19/2010  *RADIOLOGY REPORT*  Clinical Data:  Episode of severe  dizziness.  Hypertensive in emergency room. Past history of angioplasty for symptomatic basilar artery stenosis.  MRI HEAD WITH AND WITHOUT CONTRAST MRA HEAD WITHOUT CONTRAST MRA NECK WITHOUT AND WITH CONTRAST  Technique: Multiplanar, multiecho pulse sequences of the brain and surrounding structures were obtained according to standard protocol with and  without intravenous contrast.  Angiographic images of the Circle of Willis were obtained using MRA technique without intravenous contrast.  Angiographic images of the neck were obtained using MRA technique without and with intravenous contrast.  Contrast:20 ml MultiHance.  Comparison:12/19/2010 CT.  09/25/2010 MR.  MRI HEAD  Findings: Questionable tiny acute non hemorrhagic infarct anterior left frontal lobe (series 3 image 14).  No intracranial hemorrhage.  Remote small left corona radiata infarct.  Mild small vessel disease type changes.  Slightly asymmetric ventricles unchanged without evidence of hydrocephalus.  No intracranial mass or abnormal enhancement.  Mild polypoid opacification inferior aspect of the left maxillary sinus.  IMPRESSION: Questionable tiny acute non hemorrhagic infarct anterior left frontal lobe (series 3 image 14).  Remote small left corona radiata infarct.  Mild small vessel disease type changes.  Mild polypoid opacification inferior aspect of the left maxillary sinus.  MRA HEAD  Findings:Left internal carotid artery is occluded.  Reconstitution of flow cavernous segment.  Mild bulge of the supraclinoid aspect of the left internal carotid artery.  Bulge at the anterior communicating artery level appears to be an origin of a vessel.  Mild narrowing A1 segment right anterior cerebral artery.  Middle cerebral artery branch vessel irregularity bilaterally.  46% diameter stenosis mid aspect of the basilar artery without change.  The right vertebral artery is dominant.  Poor delineation of the right PICA with small left PICA.  Mild irregularity and narrowing the left AICA.  Posterior cerebral artery branch vessel irregularity.  No discrete aneurysm.  IMPRESSION: No significant change in appearance of occluded left internal carotid artery or narrowing of the mid basilar artery as detailed above.  MRA NECK  Findings:Left carotid artery is occluded at the left common carotid artery origin.  There  is reconstitution of flow of the left internal carotid artery via collaterals.  This is more flow than is seen on the MR angiogram of the circle Willis.  Mild narrowing of innominate artery.  Mild narrowing proximal right common carotid artery.  No hemodynamically significant stenosis at the right carotid bifurcation.  Ectatic vertical cervical segment of the right internal carotid artery.  Mild narrowing of proximal right subclavian artery.  Right vertebral artery is dominant in size.  Mild to slightly moderate narrowing proximal left vertebral artery. This is common area for artifactual loss of signal mimicking stenosis.  Focal loss of signal of the left vertebral artery 2.8 cm above its origin.  Question artifact versus stenosis.  Ectatic cervical segment of the vertebral arteries bilaterally.  Mild narrowing distal left vertebral artery.  IMPRESSION: Left carotid artery is occluded at the left common carotid artery origin.  There is reconstitution of flow of the left internal carotid artery via collaterals.  This is more flow than is seen on the MR angiogram of the circle Willis.  No hemodynamically significant stenosis at the right carotid bifurcation.  Right vertebral artery is dominant in size.  Mild to slightly moderate narrowing proximal left vertebral artery. This is common area for artifactual loss of signal mimicking stenosis.  Original Report Authenticated By: Fuller Canada, M.D.   Scheduled Meds:   . aspirin  300 mg Rectal Daily   Or  .  aspirin  325 mg Oral Daily  . carvedilol  6.25 mg Oral BID WC  . clopidogrel  75 mg Oral Daily  . enoxaparin  40 mg Subcutaneous Daily  . hydrochlorothiazide  25 mg Oral Daily  . levothyroxine  100 mcg Oral QAC breakfast  . losartan  100 mg Oral Daily  . pantoprazole  40 mg Oral Daily  . potassium chloride  40 mEq Oral Once  . DISCONTD: carvedilol  6.25 mg Oral BID  . DISCONTD: carvedilol  6.25 mg Oral To Minor  . DISCONTD:  losartan-hydrochlorothiazide  1 tablet Oral Daily   Continuous Infusions:    Assessment/Plan: 1. Dizziness/visual changes: Resolving. Check orthostatics tomorrow. Etiology unclear. Possible vertebrobasilar TIA. 2. Small acute stroke: Further recommendations per neurology. Continue aspirin and Plavix for now. Permissive hypertension. Goal systolic blood pressure less than 180. 3. Malignant hypertension: Improved. Continue beta blocker. Hold losartan and hydrochlorothiazide. Permissive hypertension. 4. Hypokalemia: Replete. 5. Hyperlipidemia: Intolerant to statin. Consider fish oil or as Zetia. 6. History of basilar artery stenosis/left carotid artery occlusion: Per neurology. 7. History of coronary artery disease: Stable. 8. Hypothyroidism: TSH within normal limits. Continue levothyroxine. 9. DVT prophylaxis: Enoxaparin.  Code Status: Full Family Communication: Discussed with husband at bedside Disposition Plan: Home when improved   [name], MD  [facility] Pager [phone] 12/20/2010, 10:22 AM    LOS: 1 day

## 2010-12-20 NOTE — Progress Notes (Signed)
Speech Language/Pathology Speech Language Pathology Evaluation Patient Details Name: Kelsey Dunn MRN: [id number] DOB: 29-Aug-1940 Today's Date: 12/20/2010  Problem List:  Patient Active Problem List  Diagnoses  . UNSPECIFIED HYPOTHYROIDISM  . UNSPECIFIED VITAMIN D DEFICIENCY  . PURE HYPERCHOLESTEROLEMIA  . COMMON MIGRAINE  . DETACHED RETINA  . HYPERTENSION, MALIGNANT ESSENTIAL  . ESSENTIAL HYPERTENSION  . CAD, NATIVE VESSEL  . OCCLUSION&STENOS BASILAR ART W/O MENTION INFARCT  . PVD  . MENOPAUSAL SYNDROME  . FIBROMYALGIA  . FOOT PAIN, LEFT  . GAIT DISTURBANCE  . DYSPNEA  . CHEST PAIN-PRECORDIAL  . DYSURIA  . HYPERGLYCEMIA  . UTI (lower urinary tract infection)   Past Medical History:  Past Medical History  Diagnosis Date  . Hypothyroidism 1990  . Labile hypertension 08/1991  . HLD (hyperlipidemia) 01/1997  . Fibromyalgia   . Bladder spasms   . Basilar artery stenosis     90%  . CAD (coronary artery disease)     severe stenosis RCA with placement drug eluting stent   . IC (interstitial cystitis)   . Angina   . Shortness of breath   . Hypertension   . Blood transfusion   . Pneumonia    Past Surgical History:  Past Surgical History  Procedure Date  . Bladder suspension 1974  . Total abdominal hysterectomy 1974    dysmennorhea  . Cholecystectomy 1985  . Cardiac catheterization 1993    normal  . Renal angioplasty 1993    normal  . Carotid US 11/93    Occl L comm carotid  . Visual evoked response 11/93; 1/01    normal  . US renal/aorta 12/93    Normal  . Emg 1/01    LE, RUE normal  . Carotid US 4/98    L comm occl R-ok  . Carotid US 01/30/03    L occl  R less 40%  . Dexa 10/15/04    Troch -0.4 o/w pos  . Carotid arteriogram     bilat  . Bilateral vert & subclavian angiogram 07/19/08    85-90% stenosis mid basilar art; occluded left vert artery; occluded left comm carotid artery w/ collat flow  . Basilar artery angioplasty 09/04/08    sm right  brachial hematoma  . Cath single vess dz 06/13/09    70% stenosis RCA EF 65-70%  . Coronary angioplasty with stent placement 06/19/09  . Cataract extraction w/ intraocular lens implant 2007-2008    SLP Assessment/Plan/Recommendation Assessment Clinical Impression Statement: Cognitive -Linguistic skills functional for targeted tasks on evaluation.   SLP Recommendation/Assessment: Patent does not need any further Speech Lanaguage Pathology Services No Skilled Speech Therapy: All education completed;Patient will have necessary level of assist by caregiver at discharge Recommendation Recommendations for Other Services: PT consult;OT consult Follow up Recommendations: 24 hour supervision/assistance Individuals Consulted Consulted and Agree with Results and Recommendations: Patient;Family member/caregiver   SLP Evaluation Prior Functioning Cognitive/Linguistic Baseline: Within functional limits Type of Home: House Lives With: Spouse Vocation: Retired  IT consultant Overall Cognitive Status: Appears within functional limits for tasks assessed Arousal/Alertness: Awake/alert Orientation Level: Oriented X4 Attention: Focused Focused Attention: Appears intact Memory: Appears intact Awareness: Appears intact Problem Solving: Appears intact Safety/Judgment: Appears intact  Comprehension Auditory Comprehension Yes/No Questions: Within Functional Limits Commands: Within Functional Limits Visual Recognition/Discrimination Discrimination: Within Function Limits Reading Comprehension Reading Status: Within funtional limits  Expression Expression Primary Mode of Expression: Verbal Verbal Expression Initiation: No impairment Repetition: No impairment Naming: No impairment Pragmatics: No impairment  Oral/Motor Oral Motor/Sensory Function  Labial ROM: Within Functional Limits Labial Symmetry: Within Functional Limits Labial Strength: Within Functional Limits Labial Sensation: Within  Functional Limits Lingual ROM: Within Functional Limits Lingual Symmetry: Within Functional Limits Lingual Strength: Within Functional Limits Lingual Sensation: Within Functional Limits Facial ROM: Within Functional Limits Facial Symmetry: Within Functional Limits Facial Strength: Within Functional Limits Facial Sensation: Within Functional Limits Velum: Within Functional Limits Mandible: Within Functional Limits Motor Speech Intelligibility: Intelligible Moreen Fowler, M.S, CCC-SLP [phone] [facility] 12/20/2010, 11:16 AM

## 2010-12-20 NOTE — Progress Notes (Signed)
Patient ID: Kelsey Dunn, female   DOB: Jun 08, 1940, 70 y.o.   MRN: [id number]  Subjective: Feels little better than yesterday.  Just coming back from CTA.  Still with dizziness and drunk feeling.  Objective: Vital signs in last 24 hours: Blood pressure 152/68, pulse 62, temperature 97.8 F (36.6 C), temperature source Oral, resp. rate 24, height 5\' 2"  (1.575 m), weight 96.435 kg (212 lb 9.6 oz), SpO2 95.00%. Temp:  [97.7 F (36.5 C)-99 F (37.2 C)] 97.8 F (36.6 C) (12/15 0530) Pulse Rate:  [62-95] 62  (12/15 0530) Resp:  [16-24] 24  (12/15 0530) BP: (131-226)/(49-98) 152/68 mmHg (12/15 0530) SpO2:  [95 %-100 %] 95 % (12/15 0530) Weight:  [96.435 kg (212 lb 9.6 oz)] 212 lb 9.6 oz (96.435 kg) (12/15 0127)  Intake/Output from previous day:    GENERAL EXAM: Patient is in no distress  CARDIOVASCULAR: Regular rate and rhythm  NEUROLOGIC: MENTAL STATUS: awake, alert, language fluent, comprehension intact, naming intact CRANIAL NERVE: pupils equal and reactive to light, visual fields full to confrontation, extraocular muscles intact, no nystagmus, facial sensation and strength symmetric, uvula midline, shoulder shrug symmetric, tongue midline. MOTOR: normal bulk and tone, full strength in the BUE, BLE SENSORY: normal and symmetric to light touch COORDINATION: finger-nose-finger, fine finger movements REFLEXES: deep tendon reflexes present and symmetric GAIT/STATION: not assessed   Lab Results:  [facility] 12/19/10 1809  WBC 9.6  HGB 14.8  HCT 42.6  PLT 230   BMET  Basename 12/19/10 1809  NA 141  K 3.0*  CL 104  CO2 25  GLUCOSE 127*  BUN 23  CREATININE 0.94  CALCIUM 9.5    Studies/Results:  12/19/2010 CT HEAD WITHOUT CONTRAST: No acute intracranial findings or mass lesions.  Minimal patchy white matter lesions are noted.  Original Report Authenticated By: P. Loralie Champagne, M.D.   12/19/2010  MRI HEAD WITH AND WITHOUT CONTRAST: Questionable tiny acute non  hemorrhagic infarct anterior left frontal lobe (series 3 image 14).  Remote small left corona radiata infarct.  Mild small vessel disease type changes.  Mild polypoid opacification inferior aspect of the left maxillary sinus.    12/19/10 MRA HEAD: No significant change in appearance of occluded left internal carotid artery or narrowing of the mid basilar artery as detailed above.    12/19/10 MRA NECK: Left carotid artery is occluded at the left common carotid artery origin.  There is reconstitution of flow of the left internal carotid artery via collaterals.  This is more flow than is seen on the MR angiogram of the circle Willis.  No hemodynamically significant stenosis at the right carotid bifurcation.  Right vertebral artery is dominant in size.  Mild to slightly moderate narrowing proximal left vertebral artery. This is common area for artifactual loss of signal mimicking stenosis.  Original Report Authenticated By: Fuller Canada, M.D.   Medications   . aspirin  300 mg Rectal Daily   Or  . aspirin  325 mg Oral Daily  . carvedilol  6.25 mg Oral BID WC  . clopidogrel  75 mg Oral Daily  . enoxaparin  40 mg Subcutaneous Daily  . hydrochlorothiazide  25 mg Oral Daily  . levothyroxine  100 mcg Oral QAC breakfast  . losartan  100 mg Oral Daily  . pantoprazole  40 mg Oral Daily  . potassium chloride  40 mEq Oral Once  . DISCONTD: carvedilol  6.25 mg Oral BID  . DISCONTD: carvedilol  6.25 mg Oral To Minor  .  DISCONTD: losartan-hydrochlorothiazide  1 tablet Oral Daily    Assessment/Plan: Transient dizziness, vision changes, possible vertebro-basilar TIA; Ddx include peripheral vestibulopathy, cardiogenic, hypertensive crisis (initial BP 226/98), metabolic causes.  Subtle left frontal DWI positive lesion is asymptomatic.  - f/u CTA results; I reviewed images and left CCA origin is occluded, with left ICA reconstitution via collaterals; vertebrobasilar system looks patent - aspirin, plavix -  aggressive risk factor control; LDL is 116; has tried statin before, but was intolerant in the past; could try fish oil, zetia or different low dose statin.  She will discuss with her PCP - labile BP; ok for permissive HTN in next 24-48, but would keep SBP < 180.    LOS: 1 day   PENUMALLI,VIKRAM 12/20/2010, 9:49 AM

## 2010-12-20 NOTE — Progress Notes (Signed)
Physical Therapy Cancellation Patient Details Name: Kelsey Dunn MRN: [id number] DOB: 1940-05-22 Today's Date: 12/20/2010  PT order received however order to start therapy tomorrow December 16th.  Pt currently on bedrest.  Will hold PT eval until tomorrow.  Thanks!!!  Anwen Cannedy 12/20/2010, 9:55 AM [phone]

## 2010-12-20 NOTE — Consult Note (Signed)
Referring Physician: Irene Limbo    Chief Complaint: dizziness  HPI: Kelsey Dunn is an 70 y.o. female who reports that she had a normal day until about 1645 when she reports she was sitting up in her recliner.  Everything went black and she felt as if she was "leaving her body".  She reports that it was as if she was going to die.  Her eyes closed and every time she opened them back up she felt as if the room was spinning.  She had her husband call EMS and the patient was brought to the ED.  Head CT was unremarkable.  MRI shows a questionable small acute infarct in the left frontal lobe.  Patient now reports that the dizziness is gone but she has a fullness in her head.   LSN: [id number] tPA Given: No: Symptoms quickly improved  Past Medical History  Diagnosis Date  . Hypothyroidism 1990  . Labile hypertension 08/1991  . HLD (hyperlipidemia) 01/1997  . Fibromyalgia   . Bladder spasms   . Basilar artery stenosis     90%  . CAD (coronary artery disease)     severe stenosis RCA with placement drug eluting stent   . IC (interstitial cystitis)   . Angina   . Shortness of breath   . Hypertension   . Blood transfusion   . Pneumonia     Past Surgical History  Procedure Date  . Bladder suspension 1974  . Total abdominal hysterectomy 1974    dysmennorhea  . Cholecystectomy 1985  . Cardiac catheterization 1993    normal  . Renal angioplasty 1993    normal  . Carotid US 11/93    Occl L comm carotid  . Visual evoked response 11/93; 1/01    normal  . US renal/aorta 12/93    Normal  . Emg 1/01    LE, RUE normal  . Carotid US 4/98    L comm occl R-ok  . Carotid US 01/30/03    L occl  R less 40%  . Dexa 10/15/04    Troch -0.4 o/w pos  . Carotid arteriogram     bilat  . Bilateral vert & subclavian angiogram 07/19/08    85-90% stenosis mid basilar art; occluded left vert artery; occluded left comm carotid artery w/ collat flow  . Basilar artery angioplasty 09/04/08    sm right brachial  hematoma  . Cath single vess dz 06/13/09    70% stenosis RCA EF 65-70%  . Coronary angioplasty with stent placement 06/19/09  . Cataract extraction w/ intraocular lens implant 2007-2008    Family History  Problem Relation Age of Onset  . Lung cancer Father   . Heart attack Mother   . Lymphoma Mother     s/p chemo  . Stroke Mother     x 2  . Diabetes Brother    Social History:  reports that she has never smoked. She does not have any smokeless tobacco history on file. She reports that she does not drink alcohol or use illicit drugs.  Allergies:  Allergies  Allergen Reactions  . Anesthetics, Amide     REACTION:FEVER, SYNCOPE  . Codeine Nausea And Vomiting    Reaction:Hallucinations  . Nitrofurantoin Nausea Only    REACTION: Hallucinations  . Orphenadrine Citrate Nausea And Vomiting    REACTION: Hallucination  . Pentosan Polysulfate Sodium     REACTION: rash/ severe headache  . Statins     myalgias    Medications:  I  have reviewed the patient's current medications. Prior to Admission:  Prescriptions prior to admission  Medication Sig Dispense Refill  . aspirin EC 325 MG tablet Take 325 mg by mouth daily.        . carvedilol (COREG) 6.25 MG tablet Take 6.25 mg by mouth 2 (two) times daily.        . clopidogrel (PLAVIX) 75 MG tablet Take 75 mg by mouth daily.       . lansoprazole (PREVACID) 15 MG capsule Take 15 mg by mouth daily as needed. For heartburn      . levothyroxine (SYNTHROID, LEVOTHROID) 100 MCG tablet Take 100 mcg by mouth daily.        Marland Kitchen losartan-hydrochlorothiazide (HYZAAR) 100-25 MG per tablet        . nitroGLYCERIN (NITROSTAT) 0.4 MG SL tablet Place 0.4 mg under the tongue every 5 (five) minutes as needed. For chest pain        ROS: History obtained from the patient  General ROS: negative for - chills, fatigue, fever, night sweats, weight gain or weight loss Psychological ROS: negative for - behavioral disorder, hallucinations, memory difficulties, mood  swings or suicidal ideation Ophthalmic ROS: as noted in HPI ENT ROS: negative for - epistaxis, nasal discharge, oral lesions, sore throat, tinnitus or vertigo Allergy and Immunology ROS: negative for - hives or itchy/watery eyes Hematological and Lymphatic ROS: negative for - bleeding problems, bruising or swollen lymph nodes Endocrine ROS: negative for - galactorrhea, hair pattern changes, polydipsia/polyuria or temperature intolerance Respiratory ROS: negative for - cough, hemoptysis, shortness of breath or wheezing Cardiovascular ROS: negative for - chest pain, dyspnea on exertion, edema or irregular heartbeat Gastrointestinal ROS: negative for - abdominal pain, diarrhea, hematemesis, nausea/vomiting or stool incontinence Genito-Urinary ROS: negative for - dysuria, hematuria, incontinence or urinary frequency/urgency Musculoskeletal ROS: negative for - joint swelling or muscular weakness Neurological ROS: as noted in HPI Dermatological ROS: negative for rash and skin lesion changes  Physical Examination: Blood pressure 197/80, pulse 68, temperature 97.7 F (36.5 C), temperature source Oral, resp. rate 20, height 5\' 2"  (1.575 m), weight 96.435 kg (212 lb 9.6 oz), SpO2 95.00%.  Neurologic Examination: Mental Status: Alert, oriented, thought content appropriate.  Speech fluent without evidence of aphasia.  Able to follow 3 step commands without difficulty. Cranial Nerves: II: visual fields grossly normal, pupils equal, round, reactive to light and accommodation III,IV, VI: ptosis not present, extra-ocular motions intact bilaterally V,VII: smile symmetric, facial light touch sensation normal bilaterally VIII: hearing normal bilaterally IX,X: gag reflex present XI: trapezius strength/neck flexion strength normal bilaterally XII: tongue strength normal  Motor: Right : Upper extremity   5/5    Left:     Upper extremity   5/5  Lower extremity   5/5     Lower extremity   5/5 Tone and  bulk:normal tone throughout; no atrophy noted Sensory: Pinprick and light touch intact throughout, bilaterally Deep Tendon Reflexes: 2+ and symmetric throughout with 1+ ankle jerks bilaterally Plantars: Right: downgoing   Left: downgoing Cerebellar: normal finger-to-nose, normal rapid alternating movements and normal heel-to-shin test   Results for orders placed during the hospital encounter of 12/19/10 (from the past 48 hour(s))  URINALYSIS, ROUTINE W REFLEX MICROSCOPIC     Status: Abnormal   Collection Time   12/19/10  6:02 PM      Component Value Range Comment   Color, Urine YELLOW  YELLOW     APPearance CLEAR  CLEAR     Specific Gravity, Urine  1.015  1.005 - 1.030     pH 5.0  5.0 - 8.0     Glucose, UA NEGATIVE  NEGATIVE (mg/dL)    Hgb urine dipstick NEGATIVE  NEGATIVE     Bilirubin Urine NEGATIVE  NEGATIVE     Ketones, ur NEGATIVE  NEGATIVE (mg/dL)    Protein, ur NEGATIVE  NEGATIVE (mg/dL)    Urobilinogen, UA 0.2  0.0 - 1.0 (mg/dL)    Nitrite NEGATIVE  NEGATIVE     Leukocytes, UA SMALL (*) NEGATIVE    URINE MICROSCOPIC-ADD ON     Status: Abnormal   Collection Time   12/19/10  6:02 PM      Component Value Range Comment   Squamous Epithelial / LPF MANY (*) RARE     WBC, UA 3-6  <3 (WBC/hpf)    Bacteria, UA RARE  RARE    CBC     Status: Normal   Collection Time   12/19/10  6:09 PM      Component Value Range Comment   WBC 9.6  4.0 - 10.5 (K/uL)    RBC 4.84  3.87 - 5.11 (MIL/uL)    Hemoglobin 14.8  12.0 - 15.0 (g/dL)    HCT 81.1  91.4 - 78.2 (%)    MCV 88.0  78.0 - 100.0 (fL)    MCH 30.6  26.0 - 34.0 (pg)    MCHC 34.7  30.0 - 36.0 (g/dL)    RDW 95.6  21.3 - 08.6 (%)    Platelets 230  150 - 400 (K/uL)   DIFFERENTIAL     Status: Normal   Collection Time   12/19/10  6:09 PM      Component Value Range Comment   Neutrophils Relative 67  43 - 77 (%)    Neutro Abs 6.5  1.7 - 7.7 (K/uL)    Lymphocytes Relative 22  12 - 46 (%)    Lymphs Abs 2.1  0.7 - 4.0 (K/uL)     Monocytes Relative 8  3 - 12 (%)    Monocytes Absolute 0.7  0.1 - 1.0 (K/uL)    Eosinophils Relative 3  0 - 5 (%)    Eosinophils Absolute 0.3  0.0 - 0.7 (K/uL)    Basophils Relative 0  0 - 1 (%)    Basophils Absolute 0.0  0.0 - 0.1 (K/uL)   BASIC METABOLIC PANEL     Status: Abnormal   Collection Time   12/19/10  6:09 PM      Component Value Range Comment   Sodium 141  135 - 145 (mEq/L)    Potassium 3.0 (*) 3.5 - 5.1 (mEq/L)    Chloride 104  96 - 112 (mEq/L)    CO2 25  19 - 32 (mEq/L)    Glucose, Bld 127 (*) 70 - 99 (mg/dL)    BUN 23  6 - 23 (mg/dL)    Creatinine, Ser 5.78  0.50 - 1.10 (mg/dL)    Calcium 9.5  8.4 - 10.5 (mg/dL)    GFR calc non Af Amer 61 (*) >90 (mL/min)    GFR calc Af Amer 71 (*) >90 (mL/min)   TROPONIN I     Status: Normal   Collection Time   12/19/10  6:13 PM      Component Value Range Comment   Troponin I <0.30  <0.30 (ng/mL)   GLUCOSE, CAPILLARY     Status: Abnormal   Collection Time   12/19/10  6:24 PM      Component  Value Range Comment   Glucose-Capillary 128 (*) 70 - 99 (mg/dL)    Dg Chest 2 View  04/54/0981  *RADIOLOGY REPORT*  Clinical Data: Left CVA.  CHEST - 2 VIEW  Comparison: 06/10/2009  Findings: Limited by patient body habitus and hypoaeration.  Within this limitation, no focal area of consolidation identified.  No pleural effusion or pneumothorax.  Multilevel degenerative changes. No acute osseous abnormality.  Surgical clips right upper quadrant. Small hiatal hernia.  IMPRESSION: Interstitial and vascular crowding is likely due to technical limatations described above.  No definite focal consolidation.  Original Report Authenticated By: Waneta Martins, M.D.   Ct Head Wo Contrast  12/19/2010  *RADIOLOGY REPORT*  Clinical Data: Dizziness.  CT HEAD WITHOUT CONTRAST  Technique:  Contiguous axial images were obtained from the base of the skull through the vertex without contrast.  Comparison: Brain MRI 09/25/2010.  Findings: The ventricles are  normal.  No extra-axial fluid collections are seen.  The brainstem and cerebellum are unremarkable.  No acute intracranial findings such as infarction or hemorrhage.  No mass lesions. There are a few small patchy white matter lesions which are better demonstrated on the prior MRI.  The bony calvarium is intact.  The visualized paranasal sinuses and mastoid air cells are clear.  IMPRESSION: No acute intracranial findings or mass lesions.  Minimal patchy white matter lesions are noted.  Original Report Authenticated By: P. Loralie Champagne, M.D.   Mr Angiogram Head Wo Contrast  12/19/2010  *RADIOLOGY REPORT*  Clinical Data:  Episode of severe dizziness.  Hypertensive in emergency room. Past history of angioplasty for symptomatic basilar artery stenosis.  MRI HEAD WITH AND WITHOUT CONTRAST MRA HEAD WITHOUT CONTRAST MRA NECK WITHOUT AND WITH CONTRAST  Technique: Multiplanar, multiecho pulse sequences of the brain and surrounding structures were obtained according to standard protocol with and without intravenous contrast.  Angiographic images of the Circle of Willis were obtained using MRA technique without intravenous contrast.  Angiographic images of the neck were obtained using MRA technique without and with intravenous contrast.  Contrast:20 ml MultiHance.  Comparison:12/19/2010 CT.  09/25/2010 MR.  MRI HEAD  Findings: Questionable tiny acute non hemorrhagic infarct anterior left frontal lobe (series 3 image 14).  No intracranial hemorrhage.  Remote small left corona radiata infarct.  Mild small vessel disease type changes.  Slightly asymmetric ventricles unchanged without evidence of hydrocephalus.  No intracranial mass or abnormal enhancement.  Mild polypoid opacification inferior aspect of the left maxillary sinus.  IMPRESSION: Questionable tiny acute non hemorrhagic infarct anterior left frontal lobe (series 3 image 14).  Remote small left corona radiata infarct.  Mild small vessel disease type changes.  Mild  polypoid opacification inferior aspect of the left maxillary sinus.  MRA HEAD  Findings:Left internal carotid artery is occluded.  Reconstitution of flow cavernous segment.  Mild bulge of the supraclinoid aspect of the left internal carotid artery.  Bulge at the anterior communicating artery level appears to be an origin of a vessel.  Mild narrowing A1 segment right anterior cerebral artery.  Middle cerebral artery branch vessel irregularity bilaterally.  46% diameter stenosis mid aspect of the basilar artery without change.  The right vertebral artery is dominant.  Poor delineation of the right PICA with small left PICA.  Mild irregularity and narrowing the left AICA.  Posterior cerebral artery branch vessel irregularity.  No discrete aneurysm.  IMPRESSION: No significant change in appearance of occluded left internal carotid artery or narrowing of the mid basilar artery as  detailed above.  MRA NECK  Findings:Left carotid artery is occluded at the left common carotid artery origin.  There is reconstitution of flow of the left internal carotid artery via collaterals.  This is more flow than is seen on the MR angiogram of the circle Willis.  Mild narrowing of innominate artery.  Mild narrowing proximal right common carotid artery.  No hemodynamically significant stenosis at the right carotid bifurcation.  Ectatic vertical cervical segment of the right internal carotid artery.  Mild narrowing of proximal right subclavian artery.  Right vertebral artery is dominant in size.  Mild to slightly moderate narrowing proximal left vertebral artery. This is common area for artifactual loss of signal mimicking stenosis.  Focal loss of signal of the left vertebral artery 2.8 cm above its origin.  Question artifact versus stenosis.  Ectatic cervical segment of the vertebral arteries bilaterally.  Mild narrowing distal left vertebral artery.  IMPRESSION: Left carotid artery is occluded at the left common carotid artery origin.   There is reconstitution of flow of the left internal carotid artery via collaterals.  This is more flow than is seen on the MR angiogram of the circle Willis.  No hemodynamically significant stenosis at the right carotid bifurcation.  Right vertebral artery is dominant in size.  Mild to slightly moderate narrowing proximal left vertebral artery. This is common area for artifactual loss of signal mimicking stenosis.  Original Report Authenticated By: Fuller Canada, M.D.   Mr Angiogram Neck W Wo Contrast  12/19/2010  *RADIOLOGY REPORT*  Clinical Data:  Episode of severe dizziness.  Hypertensive in emergency room. Past history of angioplasty for symptomatic basilar artery stenosis.  MRI HEAD WITH AND WITHOUT CONTRAST MRA HEAD WITHOUT CONTRAST MRA NECK WITHOUT AND WITH CONTRAST  Technique: Multiplanar, multiecho pulse sequences of the brain and surrounding structures were obtained according to standard protocol with and without intravenous contrast.  Angiographic images of the Circle of Willis were obtained using MRA technique without intravenous contrast.  Angiographic images of the neck were obtained using MRA technique without and with intravenous contrast.  Contrast:20 ml MultiHance.  Comparison:12/19/2010 CT.  09/25/2010 MR.  MRI HEAD  Findings: Questionable tiny acute non hemorrhagic infarct anterior left frontal lobe (series 3 image 14).  No intracranial hemorrhage.  Remote small left corona radiata infarct.  Mild small vessel disease type changes.  Slightly asymmetric ventricles unchanged without evidence of hydrocephalus.  No intracranial mass or abnormal enhancement.  Mild polypoid opacification inferior aspect of the left maxillary sinus.  IMPRESSION: Questionable tiny acute non hemorrhagic infarct anterior left frontal lobe (series 3 image 14).  Remote small left corona radiata infarct.  Mild small vessel disease type changes.  Mild polypoid opacification inferior aspect of the left maxillary sinus.   MRA HEAD  Findings:Left internal carotid artery is occluded.  Reconstitution of flow cavernous segment.  Mild bulge of the supraclinoid aspect of the left internal carotid artery.  Bulge at the anterior communicating artery level appears to be an origin of a vessel.  Mild narrowing A1 segment right anterior cerebral artery.  Middle cerebral artery branch vessel irregularity bilaterally.  46% diameter stenosis mid aspect of the basilar artery without change.  The right vertebral artery is dominant.  Poor delineation of the right PICA with small left PICA.  Mild irregularity and narrowing the left AICA.  Posterior cerebral artery branch vessel irregularity.  No discrete aneurysm.  IMPRESSION: No significant change in appearance of occluded left internal carotid artery or narrowing of the  mid basilar artery as detailed above.  MRA NECK  Findings:Left carotid artery is occluded at the left common carotid artery origin.  There is reconstitution of flow of the left internal carotid artery via collaterals.  This is more flow than is seen on the MR angiogram of the circle Willis.  Mild narrowing of innominate artery.  Mild narrowing proximal right common carotid artery.  No hemodynamically significant stenosis at the right carotid bifurcation.  Ectatic vertical cervical segment of the right internal carotid artery.  Mild narrowing of proximal right subclavian artery.  Right vertebral artery is dominant in size.  Mild to slightly moderate narrowing proximal left vertebral artery. This is common area for artifactual loss of signal mimicking stenosis.  Focal loss of signal of the left vertebral artery 2.8 cm above its origin.  Question artifact versus stenosis.  Ectatic cervical segment of the vertebral arteries bilaterally.  Mild narrowing distal left vertebral artery.  IMPRESSION: Left carotid artery is occluded at the left common carotid artery origin.  There is reconstitution of flow of the left internal carotid artery via  collaterals.  This is more flow than is seen on the MR angiogram of the circle Willis.  No hemodynamically significant stenosis at the right carotid bifurcation.  Right vertebral artery is dominant in size.  Mild to slightly moderate narrowing proximal left vertebral artery. This is common area for artifactual loss of signal mimicking stenosis.  Original Report Authenticated By: Fuller Canada, M.D.   Mr Laqueta Jean Wo Contrast  12/19/2010  *RADIOLOGY REPORT*  Clinical Data:  Episode of severe dizziness.  Hypertensive in emergency room. Past history of angioplasty for symptomatic basilar artery stenosis.  MRI HEAD WITH AND WITHOUT CONTRAST MRA HEAD WITHOUT CONTRAST MRA NECK WITHOUT AND WITH CONTRAST  Technique: Multiplanar, multiecho pulse sequences of the brain and surrounding structures were obtained according to standard protocol with and without intravenous contrast.  Angiographic images of the Circle of Willis were obtained using MRA technique without intravenous contrast.  Angiographic images of the neck were obtained using MRA technique without and with intravenous contrast.  Contrast:20 ml MultiHance.  Comparison:12/19/2010 CT.  09/25/2010 MR.  MRI HEAD  Findings: Questionable tiny acute non hemorrhagic infarct anterior left frontal lobe (series 3 image 14).  No intracranial hemorrhage.  Remote small left corona radiata infarct.  Mild small vessel disease type changes.  Slightly asymmetric ventricles unchanged without evidence of hydrocephalus.  No intracranial mass or abnormal enhancement.  Mild polypoid opacification inferior aspect of the left maxillary sinus.  IMPRESSION: Questionable tiny acute non hemorrhagic infarct anterior left frontal lobe (series 3 image 14).  Remote small left corona radiata infarct.  Mild small vessel disease type changes.  Mild polypoid opacification inferior aspect of the left maxillary sinus.  MRA HEAD  Findings:Left internal carotid artery is occluded.  Reconstitution of  flow cavernous segment.  Mild bulge of the supraclinoid aspect of the left internal carotid artery.  Bulge at the anterior communicating artery level appears to be an origin of a vessel.  Mild narrowing A1 segment right anterior cerebral artery.  Middle cerebral artery branch vessel irregularity bilaterally.  46% diameter stenosis mid aspect of the basilar artery without change.  The right vertebral artery is dominant.  Poor delineation of the right PICA with small left PICA.  Mild irregularity and narrowing the left AICA.  Posterior cerebral artery branch vessel irregularity.  No discrete aneurysm.  IMPRESSION: No significant change in appearance of occluded left internal carotid artery or  narrowing of the mid basilar artery as detailed above.  MRA NECK  Findings:Left carotid artery is occluded at the left common carotid artery origin.  There is reconstitution of flow of the left internal carotid artery via collaterals.  This is more flow than is seen on the MR angiogram of the circle Willis.  Mild narrowing of innominate artery.  Mild narrowing proximal right common carotid artery.  No hemodynamically significant stenosis at the right carotid bifurcation.  Ectatic vertical cervical segment of the right internal carotid artery.  Mild narrowing of proximal right subclavian artery.  Right vertebral artery is dominant in size.  Mild to slightly moderate narrowing proximal left vertebral artery. This is common area for artifactual loss of signal mimicking stenosis.  Focal loss of signal of the left vertebral artery 2.8 cm above its origin.  Question artifact versus stenosis.  Ectatic cervical segment of the vertebral arteries bilaterally.  Mild narrowing distal left vertebral artery.  IMPRESSION: Left carotid artery is occluded at the left common carotid artery origin.  There is reconstitution of flow of the left internal carotid artery via collaterals.  This is more flow than is seen on the MR angiogram of the circle  Willis.  No hemodynamically significant stenosis at the right carotid bifurcation.  Right vertebral artery is dominant in size.  Mild to slightly moderate narrowing proximal left vertebral artery. This is common area for artifactual loss of signal mimicking stenosis.  Original Report Authenticated By: Fuller Canada, M.D.    Assessment: 70 y.o. female presenting with an episode of dizziness and loss of vision.  Patient is now essentially back to baseline.  There is a questionable acute infarct on MR imaging but would be very concerned that this not the etiology of her symptoms.  MRA findings of left carotid artery occlusion are unchanged but vertebrobasilar area is difficult to visualize accurately and the patient has a history of a basilar angioplasty.  Therefore can not rule out a vertebrobasilar etiology.  Further work up indicated.    Stroke Risk Factors - hyperlipidemia, hypertension and CAD  Plan: 1. HgbA1c, fasting lipid panel 2. CTA of the head and neck 3. BP control 4. Echocardiogram 6. Continue home prophylactic therapy of Plavix and Aspirin 7. Frequent neuro checks   Thana Farr, MD [facility] [phone] 12/20/2010, 2:21 AM

## 2010-12-20 NOTE — ED Notes (Signed)
Pt provided with sack lunch. Pt still c/o dizziness.

## 2010-12-21 DIAGNOSIS — I517 Cardiomegaly: Secondary | ICD-10-CM

## 2010-12-21 MED ORDER — SIMVASTATIN 10 MG PO TABS
10.0000 mg | ORAL_TABLET | Freq: Every day | ORAL | Status: DC
  Administered 2010-12-21: 10 mg via ORAL
  Filled 2010-12-21 (×3): qty 1

## 2010-12-21 NOTE — Progress Notes (Signed)
PT NOTE: Pt supervision level for all mobility including stairs. Will have appropriate level of assistance at home. Recommend pt ambulates >/= 2x daily with supervision while in hospital. No skilled PT needs anticipated,  signing off. Please reorder if pt's status changes. Thanks!  Kelsey Dunn (Beverely Pace) Carleene Mains PT, DPT Acute Rehabilitation [phone]

## 2010-12-21 NOTE — Progress Notes (Signed)
Patient ID: Kelsey Dunn, female   DOB: 06/10/1940, 70 y.o.   MRN: [id number]  Subjective: Feels better than yesterday.  Has been up to walk.  Feels 80% back to normal.    Objective: Vital signs in last 24 hours: Blood pressure 161/82, pulse 65, temperature 97.9 F (36.6 C), temperature source Oral, resp. rate 18, height 5\' 2"  (1.575 m), weight 96.435 kg (212 lb 9.6 oz), SpO2 94.00%. Temp:  [97.9 F (36.6 C)-98.1 F (36.7 C)] 97.9 F (36.6 C) (12/16 0500) Pulse Rate:  [61-69] 65  (12/16 0830) Resp:  [18] 18  (12/16 0500) BP: (118-161)/(63-90) 161/82 mmHg (12/16 0830) SpO2:  [94 %-96 %] 94 % (12/16 0500)  Filed Vitals:   12/20/10 2100 12/21/10 0100 12/21/10 0500 12/21/10 0830  BP: 149/79 118/72 122/63 161/82  Pulse: 64 69 61 65  Temp: 97.9 F (36.6 C) 98.1 F (36.7 C) 97.9 F (36.6 C)   TempSrc: Oral Oral Oral   Resp: 18 18 18    Height:      Weight:      SpO2: 95% 95% 94%      Intake/Output from previous day:    GENERAL EXAM: Patient is in no distress  CARDIOVASCULAR: Regular rate and rhythm  NEUROLOGIC: MENTAL STATUS: awake, alert, language fluent, comprehension intact, naming intact CRANIAL NERVE: pupils equal and reactive to light, visual fields full to confrontation, extraocular muscles intact, no nystagmus, facial sensation and strength symmetric, uvula midline, shoulder shrug symmetric, tongue midline. MOTOR: normal bulk and tone, full strength in the BUE, BLE SENSORY: normal and symmetric to light touch COORDINATION: finger-nose-finger, fine finger movements REFLEXES: deep tendon reflexes present and symmetric GAIT/STATION: ABLE TO SIT UP, STAND, WALK AND TURN UNASSISTED AND BACK TO BED.  FEELS MUCH BETTER THAN YESTERDAY.   Lab Results:  [facility] 12/19/10 1809  WBC 9.6  HGB 14.8  HCT 42.6  PLT 230   BMET  Basename 12/19/10 1809  NA 141  K 3.0*  CL 104  CO2 25  GLUCOSE 127*  BUN 23  CREATININE 0.94  CALCIUM 9.5     Studies/Results:  12/19/2010 CT HEAD WITHOUT CONTRAST: No acute intracranial findings or mass lesions.  Minimal patchy white matter lesions are noted.  Original Report Authenticated By: P. Loralie Champagne, M.D.   12/19/2010  MRI HEAD WITH AND WITHOUT CONTRAST: Questionable tiny acute non hemorrhagic infarct anterior left frontal lobe (series 3 image 14).  Remote small left corona radiata infarct.  Mild small vessel disease type changes.  Mild polypoid opacification inferior aspect of the left maxillary sinus.    12/19/10 MRA HEAD: No significant change in appearance of occluded left internal carotid artery or narrowing of the mid basilar artery as detailed above.    12/19/10 MRA NECK: Left carotid artery is occluded at the left common carotid artery origin.  There is reconstitution of flow of the left internal carotid artery via collaterals.  This is more flow than is seen on the MR angiogram of the circle Willis.  No hemodynamically significant stenosis at the right carotid bifurcation.  Right vertebral artery is dominant in size.  Mild to slightly moderate narrowing proximal left vertebral artery. This is common area for artifactual loss of signal mimicking stenosis.  Original Report Authenticated By: Fuller Canada, M.D.   12/19/10 CTA HEAD The left internal carotid artery is not occluded as questioned on  MR angiogram. The left internal carotid artery may be filled via collaterals from the left external carotid artery circulation as  discussed above. The petrous, cavernous and supraclinoid segment of the left internal carotid artery are narrowed and irregular suggesting suboptimal collateral flow in addition to  atherosclerotic type changes.  Just distal to the origin of the AICAs, there is a 1 cm long  segment stenosis of the basilar artery with 41% diameter stenosis.  Right vertebral artery is dominant. Calcified plaque of the vertebral arteries more notable on the left with mild to slightly   moderate narrowing.    12/19/10 CTA NECK Left common carotid artery is occluded. At the left carotid  bifurcation, there is reconstitution of flow possibly from the external collaterals. The left internal carotid artery is markedly narrowed throughout the cervical segment.  Proximal right internal carotid artery and soft plaque with less than 20% diameter narrowing. Markedly ectatic cervical segment of  the right internal carotid artery (causes impression upon the right posterior lateral aspect of the pharynx).  Right vertebral artery is dominant. No significant stenosis of the right vertebral artery.  The left vertebral artery arises directly from the aortic arch.  Minimal narrowing of the proximal left vertebral artery. Ectatic proximal 5 cm of the left vertebral artery with areas of mild  narrowing.   - I reviewed results and agree.  Degree of basilar stenosis looks similar to prior MRA studies.  Compared to cerebral angio from 8/11, stenosis may have increased slightly (previous 25-30% on cerebral angio).  -VRP   Medications    . aspirin  300 mg Rectal Daily   Or  . aspirin  325 mg Oral Daily  . carvedilol  6.25 mg Oral BID WC  . clopidogrel  75 mg Oral Daily  . enoxaparin  40 mg Subcutaneous Daily  . levothyroxine  100 mcg Oral QAC breakfast  . pantoprazole  40 mg Oral Daily  . potassium chloride  40 mEq Oral Once  . DISCONTD: hydrochlorothiazide  25 mg Oral Daily  . DISCONTD: losartan  100 mg Oral Daily    Assessment/Plan: Transient dizziness, vision changes, possible vertebro-basilar TIA; Ddx include peripheral vestibulopathy, cardiogenic, hypertensive crisis (initial BP 226/98), metabolic causes.  Subtle left frontal DWI positive lesion is asymptomatic.  CTA confirms basilar stenosis (41%).  Compared to MRA and cerebral angiograms dating back to Dec 2010, the stenosis has ranged from 25-40%.  Therefore, this finding is probably stable since Dec 2010.  - aspirin, plavix -  aggressive risk factor control; LDL is 116; has tried crestor and simvastatin 20mg  before, but was intolerant in the past; could try fish oil, zetia or low dose statin.  She agrees to try simvastatin 10mg  qhs.   - labile BP; ok for permissive HTN in next 24-48, but would keep SBP < 180    LOS: 2 days   PENUMALLI,VIKRAM 12/21/2010, 11:14 AM

## 2010-12-21 NOTE — Progress Notes (Signed)
Echocardiogram 2D Echocardiogram has been performed.  Katheren Puller 12/21/2010, 10:49 AM

## 2010-12-21 NOTE — Progress Notes (Signed)
Physical Therapy Evaluation Patient Details Name: Kelsey Dunn MRN: [id number] DOB: 1940/01/20 Today's Date: 12/21/2010  Problem List:  Patient Active Problem List  Diagnoses  . UNSPECIFIED HYPOTHYROIDISM  . UNSPECIFIED VITAMIN D DEFICIENCY  . PURE HYPERCHOLESTEROLEMIA  . COMMON MIGRAINE  . DETACHED RETINA  . HYPERTENSION, MALIGNANT ESSENTIAL  . ESSENTIAL HYPERTENSION  . CAD, NATIVE VESSEL  . OCCLUSION&STENOS BASILAR ART W/O MENTION INFARCT  . PVD  . MENOPAUSAL SYNDROME  . FIBROMYALGIA  . FOOT PAIN, LEFT  . GAIT DISTURBANCE  . DYSPNEA  . CHEST PAIN-PRECORDIAL  . DYSURIA  . HYPERGLYCEMIA  . UTI (lower urinary tract infection)    Past Medical History:  Past Medical History  Diagnosis Date  . Hypothyroidism 1990  . Labile hypertension 08/1991  . HLD (hyperlipidemia) 01/1997  . Fibromyalgia   . Bladder spasms   . Basilar artery stenosis     90%  . CAD (coronary artery disease)     severe stenosis RCA with placement drug eluting stent   . IC (interstitial cystitis)   . Angina   . Shortness of breath   . Hypertension   . Blood transfusion   . Pneumonia    Past Surgical History:  Past Surgical History  Procedure Date  . Bladder suspension 1974  . Total abdominal hysterectomy 1974    dysmennorhea  . Cholecystectomy 1985  . Cardiac catheterization 1993    normal  . Renal angioplasty 1993    normal  . Carotid US 11/93    Occl L comm carotid  . Visual evoked response 11/93; 1/01    normal  . US renal/aorta 12/93    Normal  . Emg 1/01    LE, RUE normal  . Carotid US 4/98    L comm occl R-ok  . Carotid US 01/30/03    L occl  R less 40%  . Dexa 10/15/04    Troch -0.4 o/w pos  . Carotid arteriogram     bilat  . Bilateral vert & subclavian angiogram 07/19/08    85-90% stenosis mid basilar art; occluded left vert artery; occluded left comm carotid artery w/ collat flow  . Basilar artery angioplasty 09/04/08    sm right brachial hematoma  . Cath single vess  dz 06/13/09    70% stenosis RCA EF 65-70%  . Coronary angioplasty with stent placement 06/19/09  . Cataract extraction w/ intraocular lens implant 2007-2008    PT Assessment/Plan/Recommendation PT Assessment Clinical Impression Statement: Pt mobilizing well, recommend supervision with ambulation given pt's history and minimal balance deficits. Discussed easing back into her walking program, appropriate speed and time. Encourgaed to keep walking program consistent to decrease risk for further medical complications. Pt feels comfortable with D/C home, also feels no need for follow up therapy.  PT Recommendation/Assessment: Patent does not need any further PT services PT Recommendation Follow Up Recommendations: None Equipment Recommended: None recommended by PT PT Goals    PT signing off   PT Evaluation Precautions/Restrictions  Restrictions Weight Bearing Restrictions: No Prior Functioning  Home Living Lives With: Spouse Receives Help From: Family Type of Home: House Home Layout: One level Home Access: Stairs to enter Entrance Stairs-Rails: Can reach both Entrance Stairs-Number of Steps: 3 Bathroom Shower/Tub: Health visitor: Standard Bathroom Accessibility: Yes How Accessible: Accessible via walker Home Adaptive Equipment: Straight cane Prior Function Level of Independence: Independent with basic ADLs Driving: Yes Vocation: Retired Comments: Retired on Thursday, had stroke on Friday.  Cognition Cognition Arousal/Alertness: Awake/alert Overall  Cognitive Status: Appears within functional limits for tasks assessed Orientation Level: Oriented X4 Sensation/Coordination Sensation Light Touch: Appears Intact Additional Comments: Pt with c/o pins and needles in feet in standing - pt reports this is longstanding, possible secondary to fibromyalgia Coordination Gross Motor Movements are Fluid and Coordinated: Yes Fine Motor Movements are Fluid and Coordinated:  Yes Heel Shin Test: WNL Extremity Assessment RLE Assessment RLE Assessment: Within Functional Limits LLE Assessment LLE Assessment: Within Functional Limits Mobility (including Balance) Bed Mobility Bed Mobility: Yes Supine to Sit: 7: Independent Sitting - Scoot to Edge of Bed: 7: Independent Transfers Transfers: Yes Sit to Stand: 5: Supervision Sit to Stand Details (indicate cue type and reason): for safety given history of dizziness, no assistance required Stand to Sit: 6: Modified independent (Device/Increase time) Ambulation/Gait Ambulation/Gait: Yes Ambulation/Gait Assistance: 5: Supervision Ambulation/Gait Assistance Details (indicate cue type and reason): Supervision for safety given history, mild increase in lateral sway.  Ambulation Distance (Feet): 275 Feet Assistive device: None Gait Pattern: Decreased stride length Stairs: Yes Stairs Assistance: 5: Supervision Stairs Assistance Details (indicate cue type and reason): for safety, no assistance required.  Stair Management Technique: One rail Right;Alternating pattern Number of Stairs: 4  (ascend/descend)  Posture/Postural Control Posture/Postural Control: No significant limitations Balance Balance Assessed: Yes Static Standing Balance Static Standing - Balance Support: No upper extremity supported Static Standing - Level of Assistance: 5: Stand by assistance Static Standing - Comment/# of Minutes: ~3-4 min.  Rhomberg - Eyes Opened: 1  (min) Rhomberg - Eyes Closed: 30  (sec, stand by assistance) High Level Balance High Level Balance Activites: Turns;Head turns High Level Balance Comments: Pt with increased lateral displacement of center of mass however no significant losses of balance.  End of Session PT - End of Session Equipment Utilized During Treatment: Gait belt Activity Tolerance: Patient tolerated treatment well Patient left: in bed;with call bell in reach;with family/visitor present (sitting - decline  recliner (uncomfortable)) General Behavior During Session: [facility] for tasks performed Cognition: [facility] for tasks performed  Sherie Don 12/21/2010, 1:32 PM  Sherie Don) Carleene Mains PT, DPT Acute Rehabilitation [phone]

## 2010-12-21 NOTE — Progress Notes (Signed)
PROGRESS NOTE  Kelsey Dunn UJW:[id number] DOB: [birth year] DOA: 12/19/2010 PCP: Crawford Givens, MD, MD  Brief narrative: 70 year old woman presents the emergency department with complaint of dizziness, visual changes and presyncope. Fleeting episode of chest pain. She was admitted for acute stroke, malignant hypertension, presyncope.  Past medical history: Basilar artery stenosis, left carotid artery occlusion, coronary artery disease status post stent placement RCA, interstitial cystitis, fibromyalgia, hypothyroidism  Consultants:  Neurology  Physical therapy: No further treatment needed.  Speech pathology: No further needs.  Procedures:  None.  Antibiotics:  None.  Interim History: Neurology recommends continuing aspirin and Plavix. Start simvastatin. Permissive hypertension for now. Subjective: Feels quite a bit better. No complaints.  Objective: Filed Vitals:   12/20/10 2100 12/21/10 0100 12/21/10 0500 12/21/10 0830  BP: 149/79 118/72 122/63 161/82  Pulse: 64 69 61 65  Temp: 97.9 F (36.6 C) 98.1 F (36.7 C) 97.9 F (36.6 C)   TempSrc: Oral Oral Oral   Resp: 18 18 18    Height:      Weight:      SpO2: 95% 95% 94%    No intake or output data in the 24 hours ending 12/21/10 1311  Exam:  General: Appears well. Cardiovascular: Regular rate and rhythm. No murmur, rub or gallop. No lower extremity edema. Respiratory: Clear to auscultation bilaterally. No wheezes, rales or rhonchi. Normal respiratory effort. Neurologic: Grossly unremarkable.  Data Reviewed:  Studies: Ct Angio Neck W/cm &/or Wo/cm  12/20/2010  *RADIOLOGY REPORT*  Clinical Data:  Known left carotid occlusion. New onset of syncope and dizziness.  CT ANGIOGRAPHY HEAD AND NECK  Technique:  Multidetector CT imaging of the head and neck was performed using the standard protocol during bolus administration of intravenous contrast.  Multiplanar CT image reconstructions including MIPs were obtained to  evaluate the vascular anatomy. Carotid stenosis measurements (when applicable) are obtained utilizing NASCET criteria, using the distal internal carotid diameter as the denominator.  Contrast: 50mL OMNIPAQUE IOHEXOL 350 MG/ML IV SOLN  Comparison:  MR brain and MRA 12/19/2010.  Catheter angiogram 09/04/2009.  CTA NECK  Findings:  Left common carotid artery is occluded.  At the left carotid bifurcation, there is reconstitution of flow possibly from the external collaterals.  The left internal carotid artery is markedly narrowed throughout the cervical segment.  Ectatic right common carotid artery.  Proximal right internal carotid artery soft plaque with less than 20% diameter narrowing. Markedly ectatic cervical segment of the right internal carotid artery (causes impression upon the right posterior lateral aspect of the pharynx).  Mild narrowing right subclavian artery proximal to the takeoff of the right vertebral artery.  Right vertebral artery is dominant.  No significant stenosis of the right vertebral artery.  Right vertebral artery is ectatic.  The left vertebral artery arises directly from the aortic arch. Minimal narrowing of the proximal left vertebral artery.  Ectatic proximal 5 cm of the left vertebral artery with areas of mild narrowing.  Minimal scarring lung apices.  Mild cervical spondylotic changes.  No primary neck mass or worrisome adenopathy noted.   Review of the MIP images confirms the above findings.  IMPRESSION: Left common carotid artery is occluded.  At the left carotid bifurcation, there is reconstitution of flow possibly from the external collaterals.  The left internal carotid artery is markedly narrowed throughout the cervical segment.  Proximal right internal carotid artery and soft plaque with less than 20% diameter narrowing.  Markedly ectatic cervical segment of the right internal carotid artery (causes impression  upon the right posterior lateral aspect of the pharynx).  Right  vertebral artery is dominant.  No significant stenosis of the right vertebral artery.  The left vertebral artery arises directly from the aortic arch. Minimal narrowing of the proximal left vertebral artery.  Ectatic proximal 5 cm of the left vertebral artery with areas of mild narrowing.  CTA HEAD  Findings:  Right vertebral artery is dominant.  Calcified plaque of the vertebral arteries more notable on the left with mild to slightly moderate narrowing.  Just distal to the origin of the AICAs, there is a 1 cm long segment stenosis of the basilar artery with 41% diameter stenosis.  The left internal carotid artery is not occluded as questioned on MR angiogram.  The left internal carotid artery may be filled via collaterals from the left external carotid artery circulation as discussed above.  The petrous, cavernous and supraclinoid segment of the left internal carotid artery are narrowed and irregular suggesting suboptimal collateral flow in addition to atherosclerotic type changes.  There is a large right AICA with poor visualization of the right PICA.  No intracranial enhancing lesion.  No intracranial hemorrhage.  The questionable tiny acute frontal infarct noted on recent MRI would not be appreciated by CT.   Review of the MIP images confirms the above findings.  IMPRESSION: The left internal carotid artery is not occluded as questioned on MR angiogram.  The left internal carotid artery may be filled via collaterals from the left external carotid artery circulation as discussed above.  The petrous, cavernous and supraclinoid segment of the left internal carotid artery are narrowed and irregular suggesting suboptimal collateral flow in addition to atherosclerotic type changes.  Just distal to the origin of the AICAs, there is a 1 cm long segment stenosis of the basilar artery with 41% diameter stenosis.  Right vertebral artery is dominant.  Calcified plaque of the vertebral arteries more notable on the left with  mild to slightly moderate narrowing.  Original Report Authenticated By: Fuller Canada, M.D.   Scheduled Meds:    . aspirin  300 mg Rectal Daily   Or  . aspirin  325 mg Oral Daily  . carvedilol  6.25 mg Oral BID WC  . clopidogrel  75 mg Oral Daily  . enoxaparin  40 mg Subcutaneous Daily  . levothyroxine  100 mcg Oral QAC breakfast  . pantoprazole  40 mg Oral Daily  . potassium chloride  40 mEq Oral Once  . DISCONTD: hydrochlorothiazide  25 mg Oral Daily  . DISCONTD: losartan  100 mg Oral Daily   Continuous Infusions:    Assessment/Plan: 1. Dizziness/visual changes: Resolving. Possible vertebrobasilar TIA. 2. Small acute stroke: Further recommendations per neurology. Continue aspirin and Plavix for now. Zocor started. Permissive hypertension. Goal systolic blood pressure less than 180. Resume antihypertensives on discharge. Followup 2-D echocardiogram. 3. Malignant hypertension: Improved. Continue beta blocker. Hold losartan and hydrochlorothiazide. Permissive hypertension. Plan as above. 4. Hyperlipidemia: Zocor. 5. History of basilar artery stenosis/left carotid artery occlusion: Per neurology. 6. History of coronary artery disease: Stable. 7. Hypothyroidism: TSH within normal limits. Continue levothyroxine. 8. DVT prophylaxis: Enoxaparin.  Code Status: Full Family Communication: Discussed with husband at bedside today December 16 Disposition Plan: Home when improved, likely December 17.   [name], MD  [facility] Pager [phone] 12/21/2010, 1:11 PM    LOS: 2 days

## 2010-12-22 MED ORDER — OMEGA-3 FATTY ACIDS 1000 MG PO CAPS: 2.0000 g | ORAL_CAPSULE | Freq: Every day | ORAL | Status: DC

## 2010-12-22 MED ORDER — COENZYME Q10 30 MG PO CAPS: 100.0000 mg | ORAL_CAPSULE | Freq: Two times a day (BID) | ORAL | Status: DC

## 2010-12-22 MED ORDER — SIMVASTATIN 10 MG PO TABS: 10.0000 mg | ORAL_TABLET | Freq: Every day | ORAL | Status: DC

## 2010-12-22 NOTE — Progress Notes (Signed)
Stroke Team Progress Note  SUBJECTIVE Kelsey Dunn is an 70 y.o. female who reports that she had a normal day until about 1645 when she reports she was sitting up in her recliner. Everything went black and she felt as if she was "leaving her body". She reports that it was as if she was going to die. Her eyes closed and every time she opened them back up she felt as if the room was spinning. She had her husband call EMS and the patient was brought to the ED. Head CT was unremarkable. MRI shows a questionable small acute infarct in the left frontal lobe. Patient now reports that the dizziness is gone but she has a fullness in her .she has history of the basilar artery angioplasty by Dr.Deveshwar but he was unable to deploy a stent.She has a known history of left common carotid artery occlusion since age 52   Her husband is at the bedside. Overall she feels her condition is gradually improving.   OBJECTIVE Most recent Vital Signs: Temp: 97.9 F (36.6 C) (12/17 0500) Temp src: Oral (12/17 0500) BP: 172/79 mmHg (12/17 0809) Pulse Rate: 62  (12/17 0809) Respiratory Rate: 18 O2 Saturdation: 97%  CBG (last 3)   Basename 12/19/10 1824  GLUCAP 128*      . aspirin  325 mg Oral Daily  . carvedilol  6.25 mg Oral BID WC  . clopidogrel  75 mg Oral Daily  . enoxaparin  40 mg Subcutaneous Daily  . levothyroxine  100 mcg Oral QAC breakfast  . pantoprazole  40 mg Oral Daily  . simvastatin  10 mg Oral q1800  . DISCONTD: aspirin  300 mg Rectal Daily   Diet:  Cardiac thin liquids Activity:  Up with assistance DVT Prophylaxis:  Lovenox 40 mg sq daily   Studies: CBC    Component Value Date/Time   WBC 9.6 12/19/2010 1809   RBC 4.84 12/19/2010 1809   HGB 14.8 12/19/2010 1809   HCT 42.6 12/19/2010 1809   PLT 230 12/19/2010 1809   MCV 88.0 12/19/2010 1809   MCH 30.6 12/19/2010 1809   MCHC 34.7 12/19/2010 1809   RDW 13.0 12/19/2010 1809   LYMPHSABS 2.1 12/19/2010 1809   MONOABS 0.7 12/19/2010  1809   EOSABS 0.3 12/19/2010 1809   BASOSABS 0.0 12/19/2010 1809   CMP    Component Value Date/Time   NA 141 12/19/2010 1809   K 3.0* 12/19/2010 1809   CL 104 12/19/2010 1809   CO2 25 12/19/2010 1809   GLUCOSE 127* 12/19/2010 1809   BUN 23 12/19/2010 1809   CREATININE 0.94 12/19/2010 1809   CALCIUM 9.5 12/19/2010 1809   PROT 6.9 08/07/2010 1026   ALBUMIN 4.4 08/07/2010 1026   AST 18 08/07/2010 1026   ALT 20 08/07/2010 1026   ALKPHOS 60 08/07/2010 1026   BILITOT 0.8 08/07/2010 1026   GFRNONAA 61* 12/19/2010 1809   GFRAA 71* 12/19/2010 1809   COAGS Lab Results  Component Value Date   INR 1.01 03/13/2010   INR 1.08 06/19/2009   INR 1.0 ratio 06/10/2009   Lipid Panel    Component Value Date/Time   CHOL 184 12/20/2010 0600   TRIG 150* 12/20/2010 0600   HDL 38* 12/20/2010 0600   CHOLHDL 4.8 12/20/2010 0600   VLDL 30 12/20/2010 0600   LDLCALC 116* 12/20/2010 0600   HgbA1C  Lab Results  Component Value Date   HGBA1C 5.7* 12/20/2010   Urine Drug Screen  No results found for  this basename: labopia, cocainscrnur, labbenz, amphetmu, thcu, labbarb    Alcohol Level No results found for this basename: eth   CT of the brain  No acute intracranial findings or mass lesions. Minimal patchy white matter lesions are noted.  CT angio neck  Left common carotid artery is occluded.  At the left carotid bifurcation, there is reconstitution of flow possibly from the external collaterals.  The left internal carotid artery is markedly narrowed throughout the cervical segment.  Proximal right internal carotid artery and soft plaque with less than 20% diameter narrowing.  Markedly ectatic cervical segment of the right internal carotid artery (causes impression upon the right posterior lateral aspect of the pharynx).  Right vertebral artery is dominant.  No significant stenosis of the right vertebral artery.  The left vertebral artery arises directly from the aortic arch. Minimal narrowing of the proximal left  vertebral artery.  Ectatic proximal 5 cm of the left vertebral artery with areas of mild narrowing.   CT angio brain  The left internal carotid artery is not occluded as questioned on MR angiogram.  The left internal carotid artery may be filled via collaterals from the left external carotid artery circulation as discussed above.  The petrous, cavernous and supraclinoid segment of the left internal carotid artery are narrowed and irregular suggesting suboptimal collateral flow in addition to atherosclerotic type changes.  Just distal to the origin of the AICAs, there is a 1 cm long segment stenosis of the basilar artery with 41% diameter stenosis.  Right vertebral artery is dominant.  Calcified plaque of the vertebral arteries more notable on the left with mild to slightly moderate narrowing.  MRI of the brain  Questionable tiny acute non hemorrhagic infarct anterior left frontal lobe (series 3 image 14). Remote small left corona radiata infarct. Mild small vessel disease type changes. Mild polypoid opacification inferior aspect of the left maxillary sinus.  MRA of the brain  No significant change in appearance of occluded left internal carotid artery or narrowing of the mid basilar artery as detailed Above.   MRA of the neck Left carotid artery is occluded at the left common carotid artery origin. There is reconstitution of flow of the left internal carotid artery via collaterals. This is more flow than is seen on the MR  angiogram of the circle Willis. No hemodynamically significant stenosis at the right carotid bifurcation. Right vertebral artery is dominant in size. Mild to slightly moderate narrowing proximal left vertebral artery. This is common area for artifactual loss of signal mimicking stenosis.  2D Echocardiogram  EF 60-65% with no source of embolus. Aortic sclerosis without stenosis  CXR  Interstitial and vascular crowding is likely due to technical limatations described above. No definite  focal consolidation.  EKG  normal sinus rhythm, 1st degree AV block, new.   Physical Exam Patient is in no distress.afebrile. Cardiac exam no murmur or gallop. Lungs clear to auscultation. No pedal edema. Distal pulses are felt.  Neurological exam Awake alert oriented x 3 normal speech and language.right lateral gaze evoked nystagmus. Mild subjective diplopia to right lateral gaze. Mild left lower face asymmetry. Tongue midline. No drift. Mild diminished fine finger movements on left. Orbits right over left upper extremity. Mild left grip weak.. Normal sensation .my impaired left finger to nose coordination. Gait deferred. Regular rate and rhythm   ASSESSMENT [name] is a 70 y.o. female with a  small left paramedian pontine infarct secondary to small vessel disease. On aspirin 325  mg orally every day and clopidogrel 75 mg orally every day for secondary stroke prevention since angioplasty 2 years ago. No PT, ST needs.  Stroke risk factors:  family history, hyperlipidemia, hypertension and CAD, basilar stenosis  Hospital day # 3  TREATMENT/PLAN Continue clopidogrel 75 mg orally every day alone for secondary stroke prevention. Stop ASA (greater than 1 year since angioplasty). Add Co-Q Enzyme 10 and fish oil along with low dose simvastatin (i ordered both meds under discharge navigator, new discharge orders as they are not on the hospital formulary). Stay hydrated.Check carotid and transcranial Doppler studies Take it easy, ok to cook.Discussed with patient and daughter and answered questions  Joaquin Music, ANP-BC, GNP-BC [facility] Pager: [phone] 12/22/2010 8:21 AM  Dr. Delia Heady, Stroke Center Medical Director, has personally reviewed chart, pertinent data, examined the patient and developed the plan of care.

## 2010-12-22 NOTE — Progress Notes (Signed)
12/22/2010 Jaimere Feutz [facility] Case Management Note [phone]       Utilization review completed.  

## 2010-12-22 NOTE — Progress Notes (Addendum)
PROGRESS NOTE  Kelsey Dunn:[id number] DOB: [birth year] DOA: 12/19/2010 PCP: Crawford Givens, MD, MD  Brief narrative: 70 year old woman presents the emergency department with complaint of dizziness, visual changes and presyncope. Fleeting episode of chest pain. She was admitted for acute stroke, malignant hypertension, presyncope.  Past medical history: Basilar artery stenosis, left carotid artery occlusion, coronary artery disease status post stent placement RCA, interstitial cystitis, fibromyalgia, hypothyroidism  Consultants:  Neurology  Physical therapy: No further treatment needed.  Speech pathology: No further needs.  Procedures:  December 16: 2-D echocardiogram: Left ventricular ejection fraction 60-65%. Normal wall motion. Grade 1 diastolic dysfunction.  Antibiotics:  None.  Interim History: Neurology recommends stopping aspirin and continuing Plavix.  Subjective: Feels fine. No complaints.  Objective: Filed Vitals:   12/22/10 0100 12/22/10 0500 12/22/10 0809 12/22/10 1000  BP: 134/67 132/65 172/79 175/85  Pulse: 58 60 62 64  Temp:  97.9 F (36.6 C)  97.9 F (36.6 C)  TempSrc:  Oral  Oral  Resp:  18  20  Height:      Weight:      SpO2:  97%  97%    Intake/Output Summary (Last 24 hours) at 12/22/10 1312 Last data filed at 12/22/10 0900  Gross per 24 hour  Intake    360 ml  Output      0 ml  Net    360 ml    Exam:  General: Appears well. Cardiovascular: Regular rate and rhythm. No murmur, rub or gallop. No lower extremity edema. Respiratory: Clear to auscultation bilaterally. No wheezes, rales or rhonchi. Normal respiratory effort. Neurologic: Grossly unremarkable.  Exam remains unchanged December 17.  Scheduled Meds:    . carvedilol  6.25 mg Oral BID WC  . clopidogrel  75 mg Oral Daily  . enoxaparin  40 mg Subcutaneous Daily  . levothyroxine  100 mcg Oral QAC breakfast  . pantoprazole  40 mg Oral Daily  . simvastatin  10 mg Oral q1800    . DISCONTD: aspirin  300 mg Rectal Daily  . DISCONTD: aspirin  325 mg Oral Daily   Continuous Infusions:    Assessment/Plan: 1. Dizziness/visual changes: Resolving. Possible vertebrobasilar TIA. 2. Small acute stroke: Further recommendations per neurology. Continue Plavix for now. Stop aspirin per neurology. Zocor started. Resume antihypertensives on discharge. Followup 2-D echocardiogram. 3. Malignant hypertension: Resolved. Resume antihypertensives 4. Hyperlipidemia: Zocor. 5. History of basilar artery stenosis/left carotid artery occlusion: Per neurology. 6. History of coronary artery disease: Stable. 7. Hypothyroidism: TSH within normal limits. Continue levothyroxine. 8. DVT prophylaxis: Enoxaparin.  Needs TCD and bilateral carotid Dopplers as an outpatient. At coenzyme Q 10 per neurology. The patient wishes to hold on adding fish oil for now.  Discuss with husband at bedside.  Code Status: Full Family Communication: Discussed with husband at bedside today December 16 Disposition Plan: Home today.   [name], MD  [facility] Pager [phone] 12/22/2010, 1:12 PM    LOS: 3 days

## 2010-12-22 NOTE — Discharge Summary (Signed)
Physician Discharge Summary  Kelsey Dunn NWG:[id number] DOB: [birth year] DOA: 12/19/2010  PCP: Crawford Givens, MD, MD  Admit date: 12/19/2010 Discharge date: 12/22/2010  Discharge Diagnoses:  1. Dizziness/visual change, possible vertebrobasilar TIA 2. Small acute stroke 3. Malignant hypertension, resolved  4. Hyperlipidemia  5. Hypothyroidism 6. History of basilar artery stenosis/left carotid artery occlusion improved  Discharge Condition: Improved  Disposition: Home  History of present illness:  70 year old woman presents the emergency department with complaint of dizziness, visual changes and presyncope. She was admitted for acute stroke, malignant hypertension, presyncope.  Hospital Course:  [name] was admitted to the medical floor. She was seen in consultation with neurology. She had no further symptoms suggestive of stroke or TIA. She extensive imaging workup of the brain and neck which was interpreted by neurology in addition her radiology. At this point they have recommended continuing Plavix. Discontinue aspirin. Start Zocor. Start Cardizem Q10 and fish oil. Patient wishes to hold on fish oil for now. She was assessed by physical therapy and is not felt to have any outpatient needs. She will followup with neurology in the outpatient setting. She is known to have significant hypertension on admission which has subsequently resolved. Other issues as below. She will need an outpatient bilateral carotid ultrasound and TCD.  1. History of basilar artery stenosis/left carotid artery occlusion: Per neurology.  2. History of coronary artery disease: Stable.  3. Hypothyroidism: TSH within normal limits. Continue levothyroxine.  4. DVT prophylaxis: Enoxaparin.  Consultants:  Neurology   Physical therapy: No further treatment needed.   Speech pathology: No further needs.  Procedures:  December 16: 2-D echocardiogram: Left ventricular ejection fraction 60-65%. Normal wall  motion. Grade 1 diastolic dysfunction.  Discharge Instructions  Discharge Orders    Future Orders Please Complete By Expires   Diet - low sodium heart healthy      Increase activity slowly        Current Discharge Medication List    START taking these medications   Details  co-enzyme Q-10 30 MG capsule Take 3 capsules (90 mg total) by mouth 2 (two) times daily. Qty: 240 capsule, Refills: 2    fish oil-omega-3 fatty acids 1000 MG capsule Take 2 capsules (2 g total) by mouth daily. Qty: 30 capsule, Refills: 2      CONTINUE these medications which have NOT CHANGED   Details  carvedilol (COREG) 6.25 MG tablet Take 6.25 mg by mouth 2 (two) times daily.      clopidogrel (PLAVIX) 75 MG tablet Take 75 mg by mouth daily.     lansoprazole (PREVACID) 15 MG capsule Take 15 mg by mouth daily as needed. For heartburn    levothyroxine (SYNTHROID, LEVOTHROID) 100 MCG tablet Take 100 mcg by mouth daily.      losartan-hydrochlorothiazide (HYZAAR) 100-25 MG per tablet      nitroGLYCERIN (NITROSTAT) 0.4 MG SL tablet Place 0.4 mg under the tongue every 5 (five) minutes as needed. For chest pain      STOP taking these medications     aspirin EC 325 MG tablet        Follow-up Information    Follow up with Gates Rigg, MD. Make an appointment in 2 months.   Contact information:   [address] [facility] [place] [address] [phone]       Follow up with Crawford Givens, MD.   Contact information:   [address] [place] [address] [phone]  The results of significant diagnostics from this hospitalization (including imaging, microbiology, ancillary and laboratory) are listed below for reference.    Significant Diagnostic Studies: Ct Angio Head W/cm &/or Wo Cm  12/20/2010  *RADIOLOGY REPORT*  Clinical Data:  Known left carotid occlusion. New onset of syncope and dizziness.  CT ANGIOGRAPHY  HEAD AND NECK  Technique:  Multidetector CT imaging of the head and neck was performed using the standard protocol during bolus administration of intravenous contrast.  Multiplanar CT image reconstructions including MIPs were obtained to evaluate the vascular anatomy. Carotid stenosis measurements (when applicable) are obtained utilizing NASCET criteria, using the distal internal carotid diameter as the denominator.  Contrast: 50mL OMNIPAQUE IOHEXOL 350 MG/ML IV SOLN  Comparison:  MR brain and MRA 12/19/2010.  Catheter angiogram 09/04/2009.  CTA NECK  Findings:  Left common carotid artery is occluded.  At the left carotid bifurcation, there is reconstitution of flow possibly from the external collaterals.  The left internal carotid artery is markedly narrowed throughout the cervical segment.  Ectatic right common carotid artery.  Proximal right internal carotid artery soft plaque with less than 20% diameter narrowing. Markedly ectatic cervical segment of the right internal carotid artery (causes impression upon the right posterior lateral aspect of the pharynx).  Mild narrowing right subclavian artery proximal to the takeoff of the right vertebral artery.  Right vertebral artery is dominant.  No significant stenosis of the right vertebral artery.  Right vertebral artery is ectatic.  The left vertebral artery arises directly from the aortic arch. Minimal narrowing of the proximal left vertebral artery.  Ectatic proximal 5 cm of the left vertebral artery with areas of mild narrowing.  Minimal scarring lung apices.  Mild cervical spondylotic changes.  No primary neck mass or worrisome adenopathy noted.   Review of the MIP images confirms the above findings.  IMPRESSION: Left common carotid artery is occluded.  At the left carotid bifurcation, there is reconstitution of flow possibly from the external collaterals.  The left internal carotid artery is markedly narrowed throughout the cervical segment.  Proximal right  internal carotid artery and soft plaque with less than 20% diameter narrowing.  Markedly ectatic cervical segment of the right internal carotid artery (causes impression upon the right posterior lateral aspect of the pharynx).  Right vertebral artery is dominant.  No significant stenosis of the right vertebral artery.  The left vertebral artery arises directly from the aortic arch. Minimal narrowing of the proximal left vertebral artery.  Ectatic proximal 5 cm of the left vertebral artery with areas of mild narrowing.  CTA HEAD  Findings:  Right vertebral artery is dominant.  Calcified plaque of the vertebral arteries more notable on the left with mild to slightly moderate narrowing.  Just distal to the origin of the AICAs, there is a 1 cm long segment stenosis of the basilar artery with 41% diameter stenosis.  The left internal carotid artery is not occluded as questioned on MR angiogram.  The left internal carotid artery may be filled via collaterals from the left external carotid artery circulation as discussed above.  The petrous, cavernous and supraclinoid segment of the left internal carotid artery are narrowed and irregular suggesting suboptimal collateral flow in addition to atherosclerotic type changes.  There is a large right AICA with poor visualization of the right PICA.  No intracranial enhancing lesion.  No intracranial hemorrhage.  The questionable tiny acute frontal infarct noted on recent MRI would not be appreciated by CT.   Review  of the MIP images confirms the above findings.  IMPRESSION: The left internal carotid artery is not occluded as questioned on MR angiogram.  The left internal carotid artery may be filled via collaterals from the left external carotid artery circulation as discussed above.  The petrous, cavernous and supraclinoid segment of the left internal carotid artery are narrowed and irregular suggesting suboptimal collateral flow in addition to atherosclerotic type changes.  Just  distal to the origin of the AICAs, there is a 1 cm long segment stenosis of the basilar artery with 41% diameter stenosis.  Right vertebral artery is dominant.  Calcified plaque of the vertebral arteries more notable on the left with mild to slightly moderate narrowing.  Original Report Authenticated By: Fuller Canada, M.D.   Dg Chest 2 View  12/20/2010  *RADIOLOGY REPORT*  Clinical Data: Left CVA.  CHEST - 2 VIEW  Comparison: 06/10/2009  Findings: Limited by patient body habitus and hypoaeration.  Within this limitation, no focal area of consolidation identified.  No pleural effusion or pneumothorax.  Multilevel degenerative changes. No acute osseous abnormality.  Surgical clips right upper quadrant. Small hiatal hernia.  IMPRESSION: Interstitial and vascular crowding is likely due to technical limatations described above.  No definite focal consolidation.  Original Report Authenticated By: Waneta Martins, M.D.   Ct Head Wo Contrast  12/19/2010  *RADIOLOGY REPORT*  Clinical Data: Dizziness.  CT HEAD WITHOUT CONTRAST  Technique:  Contiguous axial images were obtained from the base of the skull through the vertex without contrast.  Comparison: Brain MRI 09/25/2010.  Findings: The ventricles are normal.  No extra-axial fluid collections are seen.  The brainstem and cerebellum are unremarkable.  No acute intracranial findings such as infarction or hemorrhage.  No mass lesions. There are a few small patchy white matter lesions which are better demonstrated on the prior MRI.  The bony calvarium is intact.  The visualized paranasal sinuses and mastoid air cells are clear.  IMPRESSION: No acute intracranial findings or mass lesions.  Minimal patchy white matter lesions are noted.  Original Report Authenticated By: P. Loralie Champagne, M.D.   Mr Angiogram Head Wo Contrast  12/20/2010  **ADDENDUM** CREATED: 12/20/2010 11:32:18  The left vertebral artery arises directly from the aortic arch. Ectasia of the  proximal left vertebral artery with areas of mild narrowing.  **END ADDENDUM** SIGNED BY: Almedia Balls. Constance Goltz, M.D.   12/20/2010  *RADIOLOGY REPORT*  Clinical Data:  Episode of severe dizziness.  Hypertensive in emergency room. Past history of angioplasty for symptomatic basilar artery stenosis.  MRI HEAD WITH AND WITHOUT CONTRAST MRA HEAD WITHOUT CONTRAST MRA NECK WITHOUT AND WITH CONTRAST  Technique: Multiplanar, multiecho pulse sequences of the brain and surrounding structures were obtained according to standard protocol with and without intravenous contrast.  Angiographic images of the Circle of Willis were obtained using MRA technique without intravenous contrast.  Angiographic images of the neck were obtained using MRA technique without and with intravenous contrast.  Contrast:20 ml MultiHance.  Comparison:12/19/2010 CT.  09/25/2010 MR.  MRI HEAD  Findings: Questionable tiny acute non hemorrhagic infarct anterior left frontal lobe (series 3 image 14).  No intracranial hemorrhage.  Remote small left corona radiata infarct.  Mild small vessel disease type changes.  Slightly asymmetric ventricles unchanged without evidence of hydrocephalus.  No intracranial mass or abnormal enhancement.  Mild polypoid opacification inferior aspect of the left maxillary sinus.  IMPRESSION: Questionable tiny acute non hemorrhagic infarct anterior left frontal lobe (series 3 image 14).  Remote small left corona radiata infarct.  Mild small vessel disease type changes.  Mild polypoid opacification inferior aspect of the left maxillary sinus.  MRA HEAD  Findings:Left internal carotid artery is occluded.  Reconstitution of flow cavernous segment.  Mild bulge of the supraclinoid aspect of the left internal carotid artery.  Bulge at the anterior communicating artery level appears to be an origin of a vessel.  Mild narrowing A1 segment right anterior cerebral artery.  Middle cerebral artery branch vessel irregularity bilaterally.  46%  diameter stenosis mid aspect of the basilar artery without change.  The right vertebral artery is dominant.  Poor delineation of the right PICA with small left PICA.  Mild irregularity and narrowing the left AICA.  Posterior cerebral artery branch vessel irregularity.  No discrete aneurysm.  IMPRESSION: No significant change in appearance of occluded left internal carotid artery or narrowing of the mid basilar artery as detailed above.  MRA NECK  Findings:Left carotid artery is occluded at the left common carotid artery origin.  There is reconstitution of flow of the left internal carotid artery via collaterals.  This is more flow than is seen on the MR angiogram of the circle Willis.  Mild narrowing of innominate artery.  Mild narrowing proximal right common carotid artery.  No hemodynamically significant stenosis at the right carotid bifurcation.  Ectatic vertical cervical segment of the right internal carotid artery.  Mild narrowing of proximal right subclavian artery.  Right vertebral artery is dominant in size.  Mild to slightly moderate narrowing proximal left vertebral artery. This is common area for artifactual loss of signal mimicking stenosis.  Focal loss of signal of the left vertebral artery 2.8 cm above its origin.  Question artifact versus stenosis.  Ectatic cervical segment of the vertebral arteries bilaterally.  Mild narrowing distal left vertebral artery.  IMPRESSION: Left carotid artery is occluded at the left common carotid artery origin.  There is reconstitution of flow of the left internal carotid artery via collaterals.  This is more flow than is seen on the MR angiogram of the circle Willis.  No hemodynamically significant stenosis at the right carotid bifurcation.  Right vertebral artery is dominant in size.  Mild to slightly moderate narrowing proximal left vertebral artery. This is common area for artifactual loss of signal mimicking stenosis.  Original Report Authenticated By: Fuller Canada, M.D.   Labs: Basic Metabolic Panel:  Lab 12/19/10 9147  NA 141  K 3.0*  CL 104  CO2 25  GLUCOSE 127*  BUN 23  CREATININE 0.94  CALCIUM 9.5  MG --  PHOS --   CBC:  Lab 12/19/10 1809  WBC 9.6  NEUTROABS 6.5  HGB 14.8  HCT 42.6  MCV 88.0  PLT 230   Cardiac Enzymes:  Lab 12/19/10 1813  CKTOTAL --  CKMB --  CKMBINDEX --  TROPONINI <0.30   CBG:  Lab 12/19/10 1824  GLUCAP 128*    Time coordinating discharge: 35 minutes  Signed:  [name], MD  [facility] 12/22/2010, 1:39 PM

## 2010-12-23 ENCOUNTER — Encounter: Payer: Self-pay | Admitting: Family Medicine

## 2010-12-23 DIAGNOSIS — R42 Dizziness and giddiness: Secondary | ICD-10-CM | POA: Insufficient documentation

## 2010-12-23 NOTE — Progress Notes (Signed)
   CARE MANAGEMENT NOTE 12/23/2010  Patient:  ABBAGAIL, SCAFF   Account Number:  [id number]  Date Initiated:  12/22/2010  Documentation initiated by:  [facility]  Subjective/Objective Assessment:   Admitted with dizziness. Lives with spouse.     Action/Plan:   PT eval- no d/c needs   Anticipated DC Date:  12/22/2010   Anticipated DC Plan:  HOME/SELF CARE      DC Planning Services  CM consult      Choice offered to / List presented to:             Status of service:  Completed, signed off Medicare Important Message given?   (If response is "NO", the following Medicare IM given date fields will be blank) Date Medicare IM given:   Date Additional Medicare IM given:    Discharge Disposition:  HOME/SELF CARE  Per UR Regulation:    Comments:  PCP Dr. Crawford Givens

## 2011-01-02 ENCOUNTER — Other Ambulatory Visit: Payer: Self-pay | Admitting: *Deleted

## 2011-01-02 ENCOUNTER — Encounter: Payer: Self-pay | Admitting: Family Medicine

## 2011-01-02 ENCOUNTER — Ambulatory Visit (INDEPENDENT_AMBULATORY_CARE_PROVIDER_SITE_OTHER): Payer: Medicare HMO | Admitting: Family Medicine

## 2011-01-02 DIAGNOSIS — R42 Dizziness and giddiness: Secondary | ICD-10-CM

## 2011-01-02 MED ORDER — CLOPIDOGREL BISULFATE 75 MG PO TABS: 75.0000 mg | ORAL_TABLET | Freq: Every day | ORAL | Status: DC

## 2011-01-02 MED ORDER — SIMVASTATIN 10 MG PO TABS: 10.0000 mg | ORAL_TABLET | Freq: Every day | ORAL | Status: DC

## 2011-01-02 NOTE — Progress Notes (Signed)
F/u from [facility].  "I felt like I was dying."  Had dizziness/visual change, possible vertebrobasilar TIA and inc in BP.  MR report: Questionable tiny acute non hemorrhagic infarct anterior left frontal lobe, so she was treated as CVA.  Has f/u with neuro in 2/13.  Started back on 400mg  co q 10 and 2000mg  fish oil per day along with simvastatin.  She has been able to tolerate the statin with the combination of meds.  She has no CP, not SOB.  Still with some scalp tingling on the R side.  She is gradually working back into exercise.  Still with some mild positional orthostasis.   PMH and SH reviewed  ROS: See HPI, otherwise noncontributory.  Meds, vitals, and allergies reviewed.   GEN: nad, alert and oriented HEENT: mucous membranes moist NECK: supple w/o LA CV: rrr.  PULM: ctab, no inc wob ABD: soft, +bs EXT: no edema SKIN: no acute rash Speech wnl.  No focal weakness or lip droop on exam

## 2011-01-02 NOTE — Patient Instructions (Signed)
Don't change your meds for now.  See Neuro in 2/13 and then come back and see me in 4/13 ( visit).  Take care.

## 2011-01-07 ENCOUNTER — Encounter: Payer: Self-pay | Admitting: Family Medicine

## 2011-01-07 NOTE — Assessment & Plan Note (Signed)
>  25 min spent with face to face with patient, >50% counseling and/or coordinating care.  Continue statin with co Q10 and cont current meds.  Has f/u with neuro pending for TCD.  Recent imaging and labs d/w pt.  BP controlled today, no new neuro sx.  Will continue as is, will await further input from neuro.

## 2011-01-20 ENCOUNTER — Ambulatory Visit (INDEPENDENT_AMBULATORY_CARE_PROVIDER_SITE_OTHER): Payer: Medicare HMO | Admitting: Family Medicine

## 2011-01-20 ENCOUNTER — Encounter: Payer: Self-pay | Admitting: Family Medicine

## 2011-01-20 VITALS — BP 146/84 | HR 67 | Temp 97.8°F | Wt 213.0 lb

## 2011-01-20 DIAGNOSIS — I10 Essential (primary) hypertension: Secondary | ICD-10-CM

## 2011-01-20 LAB — BASIC METABOLIC PANEL
BUN: 16 mg/dL (ref 6–23)
Calcium: 9.5 mg/dL (ref 8.4–10.5)
GFR: 74.69 mL/min (ref >60.00)
Glucose, Bld: 103 mg/dL — ABNORMAL HIGH (ref 70–99)
Potassium: 3.7 mEq/L (ref 3.5–5.1)
Sodium: 141 mEq/L (ref 135–145)

## 2011-01-20 MED ORDER — CARVEDILOL 6.25 MG PO TABS: 9.3750 mg | ORAL_TABLET | Freq: Two times a day (BID) | ORAL | Status: DC

## 2011-01-20 NOTE — Progress Notes (Signed)
She was having a fullness in her head last night with inc in BP.  She sat up a while last night, eventually went back to bed.  BP was still up this AM. She took her BP meds this AM and BP is better but not all the way back to baseline.  She feels better in her now.  Noted changes- she ate some pork last night, didn't add salt.  She didn't dose a miss a dose of  BP meds.    No new speech changes.  No dysphagia.  No arm or leg weakness.  No gait changes.  No new focal neuro changes.  She'd had some L 2nd MCP puffiness and tenderness, but this is better now, too.    Has been walking about 1 mile at a time. She's had some trouble with bladder spasm prev and had trouble initiating her stream.  No dysuria.    PMH and SH reviewed  ROS: See HPI, otherwise noncontributory.  Meds, vitals, and allergies reviewed.   GEN: nad, alert and oriented HEENT: mucous membranes moist NECK: supple w/o LA CV: rrr. PULM: ctab, no inc wob ABD: soft, +bs EXT: no edema SKIN: no acute rash CN 2-12 wnl B, S/S/DTR wnl x4

## 2011-01-20 NOTE — Patient Instructions (Signed)
Take 1.5 carvedilol twice a day and monitor your BP/pulse.  If your pulse is consistently below 55, let me know.  Take care.   You can get your results through our phone system.  Follow the instructions on the blue card.

## 2011-01-21 ENCOUNTER — Encounter: Payer: Self-pay | Admitting: Family Medicine

## 2011-01-21 ENCOUNTER — Telehealth: Payer: Self-pay | Admitting: Family Medicine

## 2011-01-21 ENCOUNTER — Telehealth: Payer: Self-pay | Admitting: Cardiovascular Disease

## 2011-01-21 MED ORDER — AMLODIPINE BESYLATE 5 MG PO TABS: 5.0000 mg | ORAL_TABLET | Freq: Every day | ORAL | Status: DC

## 2011-01-21 NOTE — Telephone Encounter (Signed)
No changes to current meds, but add on norvasc 5mg  a day.  I sent the rx and called pt.  She agrees.  Continue other meds as they are.  She'll call back with update on Friday or Monday.

## 2011-01-21 NOTE — Telephone Encounter (Signed)
Discussed case with Dr Graciela Husbands and he advised for pt to contact her PCP.

## 2011-01-21 NOTE — Telephone Encounter (Signed)
Pt calling re BP running high, pt's pcp changed her med yesterday but said its not working, pls call

## 2011-01-21 NOTE — Telephone Encounter (Signed)
Spoke to patient and was advise that her pulse rate has been 53-74 today and BP now is 182/110.

## 2011-01-21 NOTE — Telephone Encounter (Signed)
Find out about her pulse rate and let me know so I can help with the meds.

## 2011-01-21 NOTE — Assessment & Plan Note (Addendum)
Returning to baseline.  No focal neuro sx recently or now.  Will have her keep f/u with neuro, inc coreg to 1.5 tab of the 6.25mg  bid and she'll monitor BP/pulse.  She agrees with plan.  Call back as needed.

## 2011-01-21 NOTE — Telephone Encounter (Signed)
Patient said her blood pressure is still elevated.  She has been in bed all day and you feel pressure in her head.  Her blood pressure this morning 208/103 and she just took it @ 3:23 pm today and it was 182/110.  Patient increased blood pressure medication and she's wondering if it should have lowered her blood pressure by now.  Should patient make an appointment with you?

## 2011-03-10 ENCOUNTER — Other Ambulatory Visit: Payer: Self-pay | Admitting: Family Medicine

## 2011-03-23 NOTE — Telephone Encounter (Signed)
0

## 2011-04-06 ENCOUNTER — Encounter: Payer: Self-pay | Admitting: Cardiovascular Disease

## 2011-04-06 ENCOUNTER — Ambulatory Visit: Payer: Medicare HMO | Admitting: Cardiovascular Disease

## 2011-04-06 ENCOUNTER — Ambulatory Visit (INDEPENDENT_AMBULATORY_CARE_PROVIDER_SITE_OTHER): Payer: Medicare HMO | Admitting: Cardiovascular Disease

## 2011-04-06 ENCOUNTER — Encounter: Payer: Self-pay | Admitting: Neurology

## 2011-04-06 VITALS — BP 138/64 | HR 62 | Ht 61.0 in | Wt 209.0 lb

## 2011-04-06 DIAGNOSIS — I2510 Atherosclerotic heart disease of native coronary artery without angina pectoris: Secondary | ICD-10-CM

## 2011-04-06 DIAGNOSIS — I779 Disorder of arteries and arterioles, unspecified: Secondary | ICD-10-CM

## 2011-04-06 NOTE — Patient Instructions (Signed)
Your physician wants you to follow-up in: 6 months. You will receive a reminder letter in the mail two months in advance. If you don't receive a letter, please call our office to schedule the follow-up appointment.  You have been referred to Dr. Morton Amy neurology

## 2011-04-06 NOTE — Progress Notes (Signed)
History of Present Illness: 71 yo WF with h/o CAD with DES RCA in June 2011, HTN, hyperlipidemia, hypothyroidism and basilar artery stenosis here today for follow up. Left heart cath on 06/13/09 and she was found to have a severe stenosis in the RCA. There was minor disease in the LAD and Circumflex. She was brought back in for PCI on 06/19/09 and I placed a single DES in the RCA. She has been undergoing cerebral angiograms for basilar artery stenosis and has had angiplasty of the basilar artery in August 2010. Echo July 2010 with mild LVH, normal LV systolic function, EF of 60%, no valvular issues. She is known to have an occluded left common carotid artery. She tells me that her basilar artery stenosis has worsened. She was admitted to [facility] December 2012 with a CVA. She has been seen by Dr. Pearlean Brownie with [facility] as an oupt and further workup is planned.  She has been restarted on a low dose of simvastatin and is tolerating well.   She tells me that she is having some episodes of dizziness which are felt to be related to her cerebrovascular disease. She also describes occasional tightness in her chest after walking. No SOB or palpitations. She is walking every day, at least a mile per day.   Primary Care Physician: Dr. Para March  Last Lipid Profile:  December 2012   Total chol: 184  HDL:38   LDL:116  Past Medical History  Diagnosis Date  . Hypothyroidism 1990  . Labile hypertension 08/1991  . HLD (hyperlipidemia) 01/1997  . Fibromyalgia   . Bladder spasms   . Basilar artery stenosis     90%  . CAD (coronary artery disease)     severe stenosis RCA with placement drug eluting stent   . IC (interstitial cystitis)   . Angina   . Shortness of breath   . Hypertension   . Blood transfusion   . Pneumonia   . CVA (cerebral infarction) December 2012    Past Surgical History  Procedure Date  . Bladder suspension 1974  . Total abdominal hysterectomy 1974    dysmennorhea  .  Cholecystectomy 1985  . Cardiac catheterization 1993    normal  . Renal angioplasty 1993    normal  . Carotid US 11/93    Occl L comm carotid  . Visual evoked response 11/93; 1/01    normal  . US renal/aorta 12/93    Normal  . Emg 1/01    LE, RUE normal  . Carotid US 4/98    L comm occl R-ok  . Carotid US 01/30/03    L occl  R less 40%  . Dexa 10/15/04    Troch -0.4 o/w pos  . Carotid arteriogram     bilat  . Bilateral vert & subclavian angiogram 07/19/08    85-90% stenosis mid basilar art; occluded left vert artery; occluded left comm carotid artery w/ collat flow  . Basilar artery angioplasty 09/04/08    sm right brachial hematoma  . Cath single vess dz 06/13/09    70% stenosis RCA EF 65-70%  . Coronary angioplasty with stent placement 06/19/09  . Cataract extraction w/ intraocular lens implant 2007-2008    Current Outpatient Prescriptions  Medication Sig Dispense Refill  . amLODipine (NORVASC) 5 MG tablet Take 1 tablet (5 mg total) by mouth daily.  90 tablet  3  . carvedilol (COREG) 6.25 MG tablet Take 1.5 tablets (9.375 mg total) by mouth 2 (two) times  daily.      . clopidogrel (PLAVIX) 75 MG tablet Take 1 tablet (75 mg total) by mouth daily.  30 tablet  11  . co-enzyme Q-10 30 MG capsule Take 400 mg by mouth 2 (two) times daily.        . fish oil-omega-3 fatty acids 1000 MG capsule Take 2 capsules (2 g total) by mouth daily.  30 capsule  2  . lansoprazole (PREVACID) 15 MG capsule Take 15 mg by mouth daily as needed. For heartburn      . levothyroxine (SYNTHROID, LEVOTHROID) 100 MCG tablet Take 100 mcg by mouth daily.        Marland Kitchen losartan-hydrochlorothiazide (HYZAAR) 100-25 MG per tablet        . losartan-hydrochlorothiazide (HYZAAR) 100-25 MG per tablet TAKE 1 TABLET BY MOUTH EVERYDAY  30 tablet  6  . nitroGLYCERIN (NITROSTAT) 0.4 MG SL tablet Place 0.4 mg under the tongue every 5 (five) minutes as needed. For chest pain      . simvastatin (ZOCOR) 10 MG tablet Take 1 tablet (10  mg total) by mouth daily at 6 PM.  30 tablet  11    Allergies  Allergen Reactions  . Anesthetics, Amide     REACTION:FEVER, SYNCOPE  . Codeine Nausea And Vomiting    Reaction:Hallucinations  . Nitrofurantoin Nausea Only    REACTION: Hallucinations  . Orphenadrine Citrate Nausea And Vomiting    REACTION: Hallucination  . Pentosan Polysulfate Sodium     REACTION: rash/ severe headache  . Statins     myalgias    History   Social History  . Marital Status: Married    Spouse Name: N/A    Number of Children: 1  . Years of Education: N/A   Occupational History  . Bookkeeper-part time    Social History Main Topics  . Smoking status: Never Smoker   . Smokeless tobacco: Not on file  . Alcohol Use: No  . Drug Use: No  . Sexually Active: No   Other Topics Concern  . Not on file   Social History Narrative   Retired 2007 ([employer] [employer]    Family History  Problem Relation Age of Onset  . Lung cancer Father   . Heart attack Mother   . Lymphoma Mother     s/p chemo  . Stroke Mother     x 2  . Diabetes Brother     Review of Systems:  As stated in the HPI and otherwise negative.   BP 138/64  Pulse 62  Ht 5\' 1"  (1.549 m)  Wt 209 lb (94.802 kg)  BMI 39.49 kg/m2  Physical Examination: General: Well developed, well nourished, NAD HEENT: OP clear, mucus membranes moist SKIN: warm, dry. No rashes. Neuro: No focal deficits Musculoskeletal: Muscle strength 5/5 all ext Psychiatric: Mood and affect normal Neck: No JVD, no carotid bruits, no thyromegaly, no lymphadenopathy. Lungs:Clear bilaterally, no wheezes, rhonci, crackles Cardiovascular: Regular rate and rhythm. No murmurs, gallops or rubs. Abdomen:Soft. Bowel sounds present. Non-tender.  Extremities: No lower extremity edema. Pulses are 2 + in the bilateral DP/PT.

## 2011-04-06 NOTE — Assessment & Plan Note (Addendum)
She has single vessel CAD and has a DES in the RCA, placed 2010. She is on Plavix, statin and beta blocker. I have spoken to her about a cardiac cath as she refuses a stress test. She will call back and let us know if she continues to have problems. She refuses to plan the cath today.

## 2011-04-07 ENCOUNTER — Telehealth: Payer: Self-pay | Admitting: *Deleted

## 2011-04-07 NOTE — Telephone Encounter (Signed)
Pt called to let us know that she slowed her pace down today on the treadmill.  This helped and she was able to walk without problems.  She is aware to call if she has problems and wants to schedule cath.

## 2011-05-29 ENCOUNTER — Encounter: Payer: Self-pay | Admitting: Neurology

## 2011-05-29 ENCOUNTER — Ambulatory Visit (INDEPENDENT_AMBULATORY_CARE_PROVIDER_SITE_OTHER): Payer: Medicare HMO | Admitting: Neurology

## 2011-05-29 VITALS — BP 136/78 | HR 80 | Wt 208.0 lb

## 2011-05-29 DIAGNOSIS — G609 Hereditary and idiopathic neuropathy, unspecified: Secondary | ICD-10-CM

## 2011-05-29 DIAGNOSIS — R7309 Other abnormal glucose: Secondary | ICD-10-CM

## 2011-05-29 NOTE — Patient Instructions (Signed)
Your lab orders are in "EPIC".  You can have them drawn at Dr. Lianne Bushy office.  If they have any questions please have them call us at [phone].

## 2011-05-29 NOTE — Progress Notes (Signed)
Dear Dr. Clifton James,  Thank you for having me see Kelsey Dunn in consultation today at [facility] for her problem with left carotid occlusion and basilar artery stenosis.  As you may recall, she is a 71 y.o. year old female with a history of of hypertension, hyperlipidemia, CAD who in June 2010 had a spell where she turned over in bed and lost her vision, lasting minutes.  It was accompanied by a feeling of being "drunk" with difficulty walking lasting days afterwards.  She had no double vision, difficulty swallowing, or speaking during this time.  She was found at that time to have a severe stenosis of the basilar artery on MRA as well as a left common carotid occlusion.  MRI did not reveal any ischemic stroke at that time.  She underwent a cerebral angiogram at that time, which also revealed the occluded left common carotid, as well as a 85-90% stenosis of the basilar artery.  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  There was filling of the left ECA branches and left ICA from musculoskeletal collaterals.  There was also cross filling of the left ACA and left MCA from the right ACA. There was also a right PCOM filling the right PCA and SCA.  In August of 2010, she underwent a failed stenting(because of a tortuous right vertebral) but successful angioplasty of the basilar.  While the patient reports that she can't remember if her symptoms of dizziness got better with the angioplasty, she does say that she has had no further episodes of loss of vision.  Her last angiogram in August 2011 revealed a 25-30% stenosis of her basilar.  At that time she remained asymptomatic.  She was maintained on aspirin 325 and plavix 75mg .  In December of 2012 she had an episode of lightheadedness without loss of vision, double vision, difficulty swallowing or speaking.  An MRI brain revealed a possible acute non hemorrhagic infarct in the anterior left frontal  lobe(thought to be unrelated to her symptoms).  CTA revealed a 40% stenosis of the basilar.  Interestingly, the left vertebral was not noted to be occluded on CTA.  Left CCA was occluded but ICA was being filled by collaterals. Echo revealed 60-65% EF.  Patient is currently largely asymptomatic.  She does have some feeling of being "drunk" at times.  It seems this can be brought on by quick head movements.  No other brainstem symptoms.  No loss of vision spells.  She is currently only on Plavix as she was instructed by Dr. Pearlean Brownie to stop her aspirin.  She also has a feeling like she is going to fall over at times, usually to the right.  She has not fallen.     Past Medical History  Diagnosis Date  . Hypothyroidism 1990  . Labile hypertension 08/1991  . HLD (hyperlipidemia) 01/1997  . Fibromyalgia   . Bladder spasms   . Basilar artery stenosis     90%  . CAD (coronary artery disease)     severe stenosis RCA with placement drug eluting stent   . IC (interstitial cystitis)   . Angina   . Shortness of breath   . Hypertension   . Blood transfusion   . Pneumonia   . CVA (cerebral infarction) December 2012    Past Surgical History  Procedure Date  . Bladder suspension 1974  . Total abdominal hysterectomy 1974    dysmennorhea  . Cholecystectomy 1985  .  Cardiac catheterization 1993    normal  . Renal angioplasty 1993    normal  . Carotid US 11/93    Occl L comm carotid  . Visual evoked response 11/93; 1/01    normal  . US renal/aorta 12/93    Normal  . Emg 1/01    LE, RUE normal  . Carotid US 4/98    L comm occl R-ok  . Carotid US 01/30/03    L occl  R less 40%  . Dexa 10/15/04    Troch -0.4 o/w pos  . Carotid arteriogram     bilat  . Bilateral vert & subclavian angiogram 07/19/08    85-90% stenosis mid basilar art; occluded left vert artery; occluded left comm carotid artery w/ collat flow  . Basilar artery angioplasty 09/04/08    sm right brachial hematoma  . Cath single  vess dz 06/13/09    70% stenosis RCA EF 65-70%  . Coronary angioplasty with stent placement 06/19/09  . Cataract extraction w/ intraocular lens implant 2007-2008    History   Social History  . Marital Status: Married    Spouse Name: N/A    Number of Children: 1  . Years of Education: N/A   Occupational History  . Bookkeeper-part time    Social History Main Topics  . Smoking status: Never Smoker   . Smokeless tobacco: Never Used  . Alcohol Use: No  . Drug Use: No  . Sexually Active: No   Other Topics Concern  . None   Social History Narrative   Retired 2007 ([place] [employer])[place]    Family History  Problem Relation Age of Onset  . Lung cancer Father   . Heart attack Mother   . Lymphoma Mother     s/p chemo  . Stroke Mother     x 2  . Diabetes Brother     Current Outpatient Prescriptions on File Prior to Visit  Medication Sig Dispense Refill  . amLODipine (NORVASC) 5 MG tablet Take 1 tablet (5 mg total) by mouth daily.  90 tablet  3  . carvedilol (COREG) 6.25 MG tablet Take 1.5 tablets (9.375 mg total) by mouth 2 (two) times daily.      . clopidogrel (PLAVIX) 75 MG tablet Take 1 tablet (75 mg total) by mouth daily.  30 tablet  11  . co-enzyme Q-10 30 MG capsule Take 400 mg by mouth 2 (two) times daily.        . fish oil-omega-3 fatty acids 1000 MG capsule Take 2 capsules (2 g total) by mouth daily.  30 capsule  2  . lansoprazole (PREVACID) 15 MG capsule Take 15 mg by mouth daily as needed. For heartburn      . levothyroxine (SYNTHROID, LEVOTHROID) 100 MCG tablet Take 100 mcg by mouth daily.        Marland Kitchen losartan-hydrochlorothiazide (HYZAAR) 100-25 MG per tablet TAKE 1 TABLET BY MOUTH EVERYDAY  30 tablet  6  . nitroGLYCERIN (NITROSTAT) 0.4 MG SL tablet Place 0.4 mg under the tongue every 5 (five) minutes as needed. For chest pain      . simvastatin (ZOCOR) 10 MG tablet Take 1 tablet (10 mg total) by mouth daily at 6 PM.   30 tablet  11    Allergies  Allergen Reactions  . Anesthetics, Amide     REACTION:FEVER, SYNCOPE  . Codeine Nausea And Vomiting    Reaction:Hallucinations  . Nitrofurantoin Nausea Only    REACTION:  Hallucinations  . Orphenadrine Citrate Nausea And Vomiting    REACTION: Hallucination  . Pentosan Polysulfate Sodium     REACTION: rash/ severe headache  . Statins     myalgias      ROS:  13 systems were reviewed and are notable for feeling of crawling in the legs.   All other review of systems are unremarkable.   Examination:  Filed Vitals:   05/29/11 1316  BP: 136/78  Pulse: 80  Weight: 208 lb (94.348 kg)     In general, well appearing women.  Cardiovascular: The patient has a regular rate and rhythm.  Fundoscopy:  Disks are flat. Vessel caliber within normal limits.  Mental status:   The patient is oriented to person, place and time. Recent and remote memory are intact. Attention span and concentration are normal. Language including repetition, naming, following commands are intact. Fund of knowledge of current and historical events, as well as vocabulary are normal.  Cranial Nerves: Pupils are equally round and reactive to light. Visual fields full to confrontation. Extraocular movements are intact without nystagmus. Facial sensation and muscles of mastication are intact. Muscles of facial expression are symmetric. Hearing intact to bilateral finger rub. Tongue protrusion, uvula, palate midline.  Shoulder shrug intact  Motor:  The patient has normal bulk and tone, no pronator drift.  There are no adventitious movements.  5/5 muscle strength bilaterally.  Reflexes:   Biceps  Triceps Brachioradialis Knee Ankle  Right 2+  2+  2+   2+ 1+  Left  2+  2+  2+   2+ 1+  Toes down  Coordination:  Normal finger to nose.  No dysdiadokinesia.  Sensation is decreased to temperature in a length dep manner in upper and lower extremities, but vibration position  intact.  Gait and Station are normal.   Romberg is negative  Noted that her Lipid panel had an LDL of 116 and HDL of 38 at last visit.  She was started on simvastatin at that time.  She has not been able to tolerate it in the past but is taking Co Q10 and Fish oil as per Dr. Marlis Edelson instructions and is tolerating a dose of 10mg .   Impression/Recs: 1.  Vertebrobasilar disease - I am not convinced that she is symptomatic from her disease at this time, but of course I am worried about her spells of dizziness as well as mild difficulty walking.  I assume her prolonged dizziness and imbalance upon initial presentation were due to a low flow state at that time.  Continuing on her Plavix is appropriate now, and I agree that the addition of aspirin carries little benefit.  I spoke to the patient a long time about the risks of a repeat angioplasty of her basilar and that there is little evidence that angioplasty or stenting of the cerebrovascular circulation is any better(and may be worse) than the best medical management.  Luckily at this time we do not need to make that decision.  If she develops a recurring stenosis(with symptoms) I would likely refer her to the vascular group at [facility] for a second opinion.  I have emphasized the importance of good cholesterol control and weight loss.  We are going to check a fasting cholesterol and if necessary increase her simvastatin to 20mg  daily.  We are going to target an LDL below 100.  I am not going to check her basilar circulation today, but will consider a repeat CTA in 3 months.  2.  PN -  clinical PN on exam.  PN labs.   We will see the patient back in 3 months.  Thank you for having [place] see Kelsey Dunn in consultation.  Feel free to contact me with any questions.  Lupita Raider Modesto Charon, MD [facility], [place] [address] [place], [place] [address] Phone: [phone] Fax: [phone].

## 2011-06-02 ENCOUNTER — Other Ambulatory Visit: Payer: Self-pay | Admitting: Neurology

## 2011-06-02 ENCOUNTER — Other Ambulatory Visit (INDEPENDENT_AMBULATORY_CARE_PROVIDER_SITE_OTHER): Payer: Medicare HMO

## 2011-06-02 DIAGNOSIS — R7309 Other abnormal glucose: Secondary | ICD-10-CM

## 2011-06-02 DIAGNOSIS — G609 Hereditary and idiopathic neuropathy, unspecified: Secondary | ICD-10-CM

## 2011-06-02 LAB — CBC WITH DIFFERENTIAL/PLATELET
Hemoglobin: 14.9 g/dL (ref 12.0–15.0)
Lymphocytes Relative: 31 % (ref 12–46)
Lymphs Abs: 1.8 10*3/uL (ref 0.7–4.0)
Neutro Abs: 3.4 10*3/uL (ref 1.7–7.7)
Neutrophils Relative %: 58 % (ref 43–77)
Platelets: 254 10*3/uL (ref 150–400)
RBC: 4.87 MIL/uL (ref 3.87–5.11)
WBC: 5.8 10*3/uL (ref 4.0–10.5)

## 2011-06-02 LAB — COMPREHENSIVE METABOLIC PANEL
ALT: 17 U/L (ref 0–35)
Albumin: 4.2 g/dL (ref 3.5–5.2)
CO2: 30 mEq/L (ref 19–32)
Calcium: 9.7 mg/dL (ref 8.4–10.5)
Chloride: 104 mEq/L (ref 96–112)
Potassium: 4.2 mEq/L (ref 3.5–5.3)
Sodium: 143 mEq/L (ref 135–145)
Total Bilirubin: 0.8 mg/dL (ref 0.3–1.2)
Total Protein: 6.5 g/dL (ref 6.0–8.3)

## 2011-06-03 LAB — LIPID PANEL
Cholesterol: 127 mg/dL (ref 0–200)
LDL Cholesterol: 61 mg/dL (ref 0–99)
Total CHOL/HDL Ratio: 3.3 Ratio
Triglycerides: 142 mg/dL (ref <150)
VLDL: 28 mg/dL (ref 0–40)

## 2011-06-03 LAB — C-REACTIVE PROTEIN: CRP: 0.33 mg/dL (ref <0.60)

## 2011-06-03 LAB — SEDIMENTATION RATE: Sed Rate: 1 mm/hr (ref 0–22)

## 2011-06-04 LAB — SPEP & IFE WITH QIG
Albumin ELP: 61.1 % (ref 55.8–66.1)
Alpha-1-Globulin: 4.5 % (ref 2.9–4.9)
Alpha-2-Globulin: 11.1 % (ref 7.1–11.8)
Beta 2: 4.9 % (ref 3.2–6.5)
Beta Globulin: 6.4 % (ref 4.7–7.2)
Gamma Globulin: 12 % (ref 11.1–18.8)
IgM, Serum: 66 mg/dL (ref 52–322)

## 2011-06-05 ENCOUNTER — Telehealth: Payer: Self-pay | Admitting: Neurology

## 2011-06-05 NOTE — Telephone Encounter (Signed)
Spoke with the patient. Informed all lab work ok. No other issues voiced at this time.

## 2011-06-05 NOTE — Telephone Encounter (Signed)
Message copied by Benay Spice on Fri Jun 05, 2011  9:47 AM ------      Message from: Milas Gain      Created: Fri Jun 05, 2011  9:10 AM       Let Ms. Treloar know that her cholesterol now is very good.  Other labs were ok as well.

## 2011-06-06 ENCOUNTER — Other Ambulatory Visit: Payer: Self-pay | Admitting: Cardiovascular Disease

## 2011-08-10 ENCOUNTER — Encounter (HOSPITAL_COMMUNITY): Payer: Self-pay | Admitting: *Deleted

## 2011-08-10 ENCOUNTER — Encounter (HOSPITAL_COMMUNITY)
Admission: EM | Disposition: A | Discharge status: Home / Self Care | Payer: Self-pay | Source: Home / Self Care | Attending: Cardiovascular Disease

## 2011-08-10 ENCOUNTER — Emergency Department (HOSPITAL_COMMUNITY): Payer: Medicare HMO

## 2011-08-10 ENCOUNTER — Inpatient Hospital Stay (HOSPITAL_COMMUNITY)
Admission: EM | Admit: 2011-08-10 | Discharge: 2011-08-11 | DRG: 287 | Disposition: A | Discharge status: Home / Self Care | Payer: Medicare HMO | Attending: Cardiovascular Disease | Admitting: Cardiovascular Disease

## 2011-08-10 ENCOUNTER — Telehealth: Payer: Self-pay | Admitting: Cardiovascular Disease

## 2011-08-10 DIAGNOSIS — I200 Unstable angina: Secondary | ICD-10-CM

## 2011-08-10 DIAGNOSIS — E7800 Pure hypercholesterolemia, unspecified: Secondary | ICD-10-CM | POA: Diagnosis present

## 2011-08-10 DIAGNOSIS — I2510 Atherosclerotic heart disease of native coronary artery without angina pectoris: Principal | ICD-10-CM | POA: Diagnosis present

## 2011-08-10 DIAGNOSIS — I442 Atrioventricular block, complete: Secondary | ICD-10-CM

## 2011-08-10 DIAGNOSIS — I651 Occlusion and stenosis of basilar artery: Secondary | ICD-10-CM | POA: Diagnosis present

## 2011-08-10 DIAGNOSIS — IMO0001 Reserved for inherently not codable concepts without codable children: Secondary | ICD-10-CM | POA: Diagnosis present

## 2011-08-10 DIAGNOSIS — E785 Hyperlipidemia, unspecified: Secondary | ICD-10-CM | POA: Diagnosis present

## 2011-08-10 DIAGNOSIS — R079 Chest pain, unspecified: Secondary | ICD-10-CM

## 2011-08-10 DIAGNOSIS — I441 Atrioventricular block, second degree: Secondary | ICD-10-CM

## 2011-08-10 DIAGNOSIS — E039 Hypothyroidism, unspecified: Secondary | ICD-10-CM | POA: Diagnosis present

## 2011-08-10 DIAGNOSIS — I10 Essential (primary) hypertension: Secondary | ICD-10-CM | POA: Diagnosis present

## 2011-08-10 DIAGNOSIS — Z8673 Personal history of transient ischemic attack (TIA), and cerebral infarction without residual deficits: Secondary | ICD-10-CM

## 2011-08-10 HISTORY — PX: LEFT HEART CATHETERIZATION WITH CORONARY ANGIOGRAM: SHX5451

## 2011-08-10 LAB — CBC WITH DIFFERENTIAL/PLATELET
Basophils Relative: 0 % (ref 0–1)
Eosinophils Absolute: 0.2 10*3/uL (ref 0.0–0.7)
Hemoglobin: 15.2 g/dL — ABNORMAL HIGH (ref 12.0–15.0)
Lymphs Abs: 2.1 10*3/uL (ref 0.7–4.0)
MCH: 30.8 pg (ref 26.0–34.0)
Neutro Abs: 4.7 10*3/uL (ref 1.7–7.7)
Neutrophils Relative %: 63 % (ref 43–77)
Platelets: 230 10*3/uL (ref 150–400)
RBC: 4.94 MIL/uL (ref 3.87–5.11)
WBC: 7.6 10*3/uL (ref 4.0–10.5)

## 2011-08-10 LAB — TROPONIN I: Troponin I: <0.3 ng/mL (ref <0.30)

## 2011-08-10 LAB — BASIC METABOLIC PANEL
Calcium: 9.9 mg/dL (ref 8.4–10.5)
GFR calc Af Amer: 60 mL/min — ABNORMAL LOW (ref >90)
GFR calc non Af Amer: 52 mL/min — ABNORMAL LOW (ref >90)
Glucose, Bld: 115 mg/dL — ABNORMAL HIGH (ref 70–99)
Potassium: 3.4 mEq/L — ABNORMAL LOW (ref 3.5–5.1)
Sodium: 142 mEq/L (ref 135–145)

## 2011-08-10 LAB — CARDIAC PANEL(CRET KIN+CKTOT+MB+TROPI)
Relative Index: INVALID (ref 0.0–2.5)
Troponin I: <0.3 ng/mL (ref <0.30)

## 2011-08-10 LAB — POCT ACTIVATED CLOTTING TIME: Activated Clotting Time: 154 seconds

## 2011-08-10 LAB — PROTIME-INR
INR: 1.01 (ref 0.00–1.49)
Prothrombin Time: 13.5 seconds (ref 11.6–15.2)

## 2011-08-10 LAB — D-DIMER, QUANTITATIVE: D-Dimer, Quant: 0.24 ug/mL-FEU (ref 0.00–0.48)

## 2011-08-10 SURGERY — LEFT HEART CATHETERIZATION WITH CORONARY ANGIOGRAM
Anesthesia: LOCAL

## 2011-08-10 MED ORDER — SODIUM CHLORIDE 0.9 % IV SOLN: 250.0000 mL | INTRAVENOUS | Status: DC | PRN

## 2011-08-10 MED ORDER — ASPIRIN 325 MG PO TABS
325.0000 mg | ORAL_TABLET | Freq: Once | ORAL | Status: AC
  Administered 2011-08-10: 325 mg via ORAL
  Filled 2011-08-10: qty 1

## 2011-08-10 MED ORDER — LOSARTAN POTASSIUM 50 MG PO TABS
100.0000 mg | ORAL_TABLET | Freq: Every day | ORAL | Status: DC
  Administered 2011-08-11: 100 mg via ORAL
  Filled 2011-08-10: qty 2

## 2011-08-10 MED ORDER — HEPARIN SODIUM (PORCINE) 5000 UNIT/ML IJ SOLN
5000.0000 [IU] | Freq: Three times a day (TID) | INTRAMUSCULAR | Status: DC
  Administered 2011-08-11: 5000 [IU] via SUBCUTANEOUS
  Filled 2011-08-10 (×4): qty 1

## 2011-08-10 MED ORDER — PANTOPRAZOLE SODIUM 40 MG PO TBEC
40.0000 mg | DELAYED_RELEASE_TABLET | Freq: Every day | ORAL | Status: DC
  Administered 2011-08-10 – 2011-08-11 (×2): 40 mg via ORAL
  Filled 2011-08-10 (×4): qty 1

## 2011-08-10 MED ORDER — HEPARIN BOLUS VIA INFUSION
4000.0000 [IU] | Freq: Once | INTRAVENOUS | Status: AC
  Administered 2011-08-10: 4000 [IU] via INTRAVENOUS

## 2011-08-10 MED ORDER — SODIUM CHLORIDE 0.9 % IJ SOLN: 3.0000 mL | Freq: Two times a day (BID) | INTRAMUSCULAR | Status: DC

## 2011-08-10 MED ORDER — CLOPIDOGREL BISULFATE 75 MG PO TABS
75.0000 mg | ORAL_TABLET | Freq: Every day | ORAL | Status: DC
  Administered 2011-08-11: 75 mg via ORAL
  Filled 2011-08-10: qty 1

## 2011-08-10 MED ORDER — FENTANYL CITRATE 0.05 MG/ML IJ SOLN
INTRAMUSCULAR | Status: AC
  Filled 2011-08-10: qty 2

## 2011-08-10 MED ORDER — MIDAZOLAM HCL 2 MG/2ML IJ SOLN
INTRAMUSCULAR | Status: AC
  Filled 2011-08-10: qty 2

## 2011-08-10 MED ORDER — ZOLPIDEM TARTRATE 5 MG PO TABS: 5.0000 mg | ORAL_TABLET | Freq: Every evening | ORAL | Status: DC | PRN

## 2011-08-10 MED ORDER — AMLODIPINE BESYLATE 5 MG PO TABS
5.0000 mg | ORAL_TABLET | Freq: Every day | ORAL | Status: DC
Start: 2011-08-11 — End: 2011-08-11
  Administered 2011-08-11: 5 mg via ORAL
  Filled 2011-08-10: qty 1

## 2011-08-10 MED ORDER — LIDOCAINE HCL (PF) 1 % IJ SOLN
INTRAMUSCULAR | Status: AC
  Filled 2011-08-10: qty 30

## 2011-08-10 MED ORDER — ACETAMINOPHEN 325 MG PO TABS: 650.0000 mg | ORAL_TABLET | ORAL | Status: DC | PRN

## 2011-08-10 MED ORDER — SODIUM CHLORIDE 0.9 % IV SOLN: INTRAVENOUS | Status: AC

## 2011-08-10 MED ORDER — ONDANSETRON HCL 4 MG/2ML IJ SOLN: 4.0000 mg | Freq: Four times a day (QID) | INTRAMUSCULAR | Status: DC | PRN

## 2011-08-10 MED ORDER — NITROGLYCERIN 0.4 MG SL SUBL
0.4000 mg | SUBLINGUAL_TABLET | SUBLINGUAL | Status: DC | PRN
  Administered 2011-08-10: 0.4 mg via SUBLINGUAL

## 2011-08-10 MED ORDER — SODIUM CHLORIDE 0.9 % IJ SOLN: 3.0000 mL | INTRAMUSCULAR | Status: DC | PRN

## 2011-08-10 MED ORDER — HYDROCHLOROTHIAZIDE 25 MG PO TABS
25.0000 mg | ORAL_TABLET | Freq: Every day | ORAL | Status: DC
  Administered 2011-08-11: 25 mg via ORAL
  Filled 2011-08-10: qty 1

## 2011-08-10 MED ORDER — OMEGA-3-ACID ETHYL ESTERS 1 G PO CAPS
1.0000 g | ORAL_CAPSULE | Freq: Two times a day (BID) | ORAL | Status: DC
  Administered 2011-08-10 – 2011-08-11 (×2): 1 g via ORAL
  Filled 2011-08-10 (×3): qty 1

## 2011-08-10 MED ORDER — NITROGLYCERIN 0.4 MG SL SUBL
SUBLINGUAL_TABLET | SUBLINGUAL | Status: AC
  Filled 2011-08-10: qty 25

## 2011-08-10 MED ORDER — HEPARIN (PORCINE) IN NACL 100-0.45 UNIT/ML-% IJ SOLN
1200.0000 [IU]/h | INTRAMUSCULAR | Status: DC
  Administered 2011-08-10: 1200 [IU]/h via INTRAVENOUS
  Filled 2011-08-10: qty 250

## 2011-08-10 MED ORDER — OMEGA-3 FATTY ACIDS 1000 MG PO CAPS: 1.0000 g | ORAL_CAPSULE | Freq: Two times a day (BID) | ORAL | Status: DC

## 2011-08-10 MED ORDER — LOSARTAN POTASSIUM-HCTZ 100-25 MG PO TABS: 1.0000 | ORAL_TABLET | Freq: Every day | ORAL | Status: DC

## 2011-08-10 MED ORDER — ASPIRIN EC 81 MG PO TBEC
81.0000 mg | DELAYED_RELEASE_TABLET | Freq: Every day | ORAL | Status: DC
  Filled 2011-08-10: qty 1

## 2011-08-10 MED ORDER — SIMVASTATIN 10 MG PO TABS
10.0000 mg | ORAL_TABLET | Freq: Every day | ORAL | Status: DC
  Administered 2011-08-11: 10 mg via ORAL
  Filled 2011-08-10: qty 1

## 2011-08-10 MED ORDER — ALPRAZOLAM 0.25 MG PO TABS: 0.2500 mg | ORAL_TABLET | Freq: Two times a day (BID) | ORAL | Status: DC | PRN

## 2011-08-10 MED ORDER — NITROGLYCERIN IN D5W 200-5 MCG/ML-% IV SOLN
2.0000 ug/min | Freq: Once | INTRAVENOUS | Status: AC
  Administered 2011-08-10: 3 ug/min via INTRAVENOUS
  Filled 2011-08-10: qty 250

## 2011-08-10 MED ORDER — NITROGLYCERIN 0.2 MG/ML ON CALL CATH LAB
INTRAVENOUS | Status: AC
  Filled 2011-08-10: qty 1

## 2011-08-10 MED ORDER — HEPARIN (PORCINE) IN NACL 2-0.9 UNIT/ML-% IJ SOLN
INTRAMUSCULAR | Status: AC
  Filled 2011-08-10: qty 2000

## 2011-08-10 MED ORDER — LEVOTHYROXINE SODIUM 100 MCG PO TABS
100.0000 ug | ORAL_TABLET | Freq: Every day | ORAL | Status: DC
  Administered 2011-08-11: 100 ug via ORAL
  Filled 2011-08-10 (×2): qty 1

## 2011-08-10 MED ORDER — ASPIRIN 81 MG PO CHEW: 81.0000 mg | CHEWABLE_TABLET | Freq: Every day | ORAL | Status: DC

## 2011-08-10 MED ORDER — BISACODYL 5 MG PO TBEC: 5.0000 mg | DELAYED_RELEASE_TABLET | Freq: Every day | ORAL | Status: DC | PRN

## 2011-08-10 NOTE — Telephone Encounter (Signed)
New msg Pt has some chest tightness and left arm tingling sent to triage

## 2011-08-10 NOTE — CV Procedure (Signed)
   Cardiac Catheterization Procedure Note  Name: Kelsey Dunn MRN: [id number] DOB: May 12, 1940  Procedure: Left Heart Cath, Selective Coronary Angiography, LV angiography  Indication: Chest pain, heart block.    Procedural details: The right groin was prepped, draped, and anesthetized with 1% lidocaine. Using modified Seldinger technique, a 5 French sheath was introduced into the right femoral artery. Standard Judkins catheters were used for coronary angiography and left ventriculography. Catheter exchanges were performed over a guidewire. There were no immediate procedural complications. The patient was transferred to the post catheterization recovery area for further monitoring.  Procedural Findings: Hemodynamics:  AO 140/77 LV 153/14   Coronary angiography: Coronary dominance: right  Left mainstem: No significant disease.  Left anterior descending (LAD): Luminal irregularities.   Left circumflex (LCx): There is a large ramus.  Luminal irregularities in the LCx system.   Right coronary artery (RCA): There is a mid-RCA stent with no significant in-stent restenosis.  Luminal irregularities in the RCA.   Left ventriculography: Left ventricular systolic function is normal, LVEF is estimated at 60-65%, there is no significant mitral regurgitation   Final Conclusions:  No obstructive CAD.  Patent RCA stent.  No explanation for chest pain.  She has intermittent probably type I 2nd degree AV block and transient complete heart block was seen in the ER.  We are going to stop Coreg and follow telemetry in the CCU.   Marca Ancona 08/10/2011, 6:15 PM

## 2011-08-10 NOTE — ED Notes (Signed)
Patient placed on zoll at 1720 and transported patient to cath lab 1728

## 2011-08-10 NOTE — ED Provider Notes (Signed)
History     CSN: [id number]  Arrival date & time 08/10/11  1527   First MD Initiated Contact with Patient 08/10/11 1545      Chief Complaint  Patient presents with  . Chest Pain    The history is provided by the patient.   patient reports increasing exertional chest pressure with associated shortness of breath over the past week.  She has a known history of coronary artery disease with a stent to her coronary artery.  She's compliant with her Plavix.  Her episode of chest discomfort became more severe today when she was walking in the mall.  She called her cardiologist who recommended she be evaluated in emergency apartment.  At time of evaluation she is still having mild pain.  At times when her chest pressure begins and radiates to her bilateral arms makes her short of breath.  Her symptoms feel similar to her prior angina.  She denies fevers or chills.  She has no shortness of breath at this time.  She's had no recent productive cough.  She denies unilateral leg swelling.   Past Medical History  Diagnosis Date  . Hypothyroidism 1990  . Labile hypertension 08/1991  . HLD (hyperlipidemia) 01/1997  . Fibromyalgia   . Bladder spasms   . Basilar artery stenosis     90%  . CAD (coronary artery disease)     severe stenosis RCA with placement drug eluting stent   . IC (interstitial cystitis)   . Angina   . Shortness of breath   . Hypertension   . Blood transfusion   . Pneumonia   . CVA (cerebral infarction) December 2012  . Stroke     Past Surgical History  Procedure Date  . Bladder suspension 1974  . Total abdominal hysterectomy 1974    dysmennorhea  . Cholecystectomy 1985  . Cardiac catheterization 1993    normal  . Renal angioplasty 1993    normal  . Carotid US 11/93    Occl L comm carotid  . Visual evoked response 11/93; 1/01    normal  . US renal/aorta 12/93    Normal  . Emg 1/01    LE, RUE normal  . Carotid US 4/98    L comm occl R-ok  . Carotid US 01/30/03   L occl  R less 40%  . Dexa 10/15/04    Troch -0.4 o/w pos  . Carotid arteriogram     bilat  . Bilateral vert & subclavian angiogram 07/19/08    85-90% stenosis mid basilar art; occluded left vert artery; occluded left comm carotid artery w/ collat flow  . Basilar artery angioplasty 09/04/08    sm right brachial hematoma  . Cath single vess dz 06/13/09    70% stenosis RCA EF 65-70%  . Coronary angioplasty with stent placement 06/19/09  . Cataract extraction w/ intraocular lens implant 2007-2008  . Eye surgery     Family History  Problem Relation Age of Onset  . Lung cancer Father   . Heart attack Mother   . Lymphoma Mother     s/p chemo  . Stroke Mother     x 2  . Diabetes Brother     History  Substance Use Topics  . Smoking status: Never Smoker   . Smokeless tobacco: Never Used  . Alcohol Use: No    OB History    Grav Para Term Preterm Abortions TAB SAB Ect Mult Living  Review of Systems  Cardiovascular: Positive for chest pain.  All other systems reviewed and are negative.    Allergies  Anesthetics, amide; Codeine; Nitrofurantoin; Orphenadrine citrate; Pentosan polysulfate sodium; and Statins  Home Medications   Current Outpatient Rx  Name Route Sig Dispense Refill  . AMLODIPINE BESYLATE 5 MG PO TABS Oral Take 1 tablet (5 mg total) by mouth daily. 90 tablet 3  . BISACODYL 5 MG PO TBEC Oral Take 5 mg by mouth daily as needed. For constipation    . CARVEDILOL 6.25 MG PO TABS Oral Take 6.25 mg by mouth 2 (two) times daily with a meal.    . CLOPIDOGREL BISULFATE 75 MG PO TABS Oral Take 1 tablet (75 mg total) by mouth daily. 30 tablet 11  . COENZYME Q10 30 MG PO CAPS Oral Take 30 mg by mouth 2 (two) times daily.     . OMEGA-3 FATTY ACIDS 1000 MG PO CAPS Oral Take 1 g by mouth 2 (two) times daily.    Marland Kitchen LANSOPRAZOLE 15 MG PO CPDR Oral Take 15 mg by mouth daily as needed. For heartburn    . LEVOTHYROXINE SODIUM 100 MCG PO TABS Oral Take 100 mcg by mouth  daily.      Marland Kitchen LOSARTAN POTASSIUM-HCTZ 100-25 MG PO TABS Oral Take 1 tablet by mouth daily.    Marland Kitchen NITROGLYCERIN 0.4 MG SL SUBL Sublingual Place 0.4 mg under the tongue every 5 (five) minutes as needed. For chest pain    . SIMVASTATIN 10 MG PO TABS Oral Take 10 mg by mouth daily at 6 PM.    . TETRAHYDROZOLINE HCL 0.05 % OP SOLN Both Eyes Place 1 drop into both eyes as needed. For dry eyes      BP 175/71  Pulse 88  Resp 20  Ht 5' 1.02" (1.55 m)  Wt 207 lb 3.7 oz (94 kg)  BMI 39.13 kg/m2  SpO2 97%  Physical Exam  Nursing note and vitals reviewed. Constitutional: She is oriented to person, place, and time. She appears well-developed and well-nourished. No distress.  HENT:  Head: Normocephalic and atraumatic.  Eyes: EOM are normal.  Neck: Normal range of motion.  Cardiovascular: Normal rate, regular rhythm and normal heart sounds.   Pulmonary/Chest: Effort normal and breath sounds normal.  Abdominal: Soft. She exhibits no distension. There is no tenderness.  Musculoskeletal: Normal range of motion.  Neurological: She is alert and oriented to person, place, and time.  Skin: Skin is warm and dry.  Psychiatric: She has a normal mood and affect. Judgment normal.    ED Course  Procedures (including critical care time)   Date: 08/10/2011  Rate: 86  Rhythm: normal sinus rhythm  QRS Axis: normal  Intervals: normal  ST/T Wave abnormalities: normal  Conduction Disutrbances: Right bundle branch block  Narrative Interpretation:   Old EKG Reviewed: No significant changes noted  CRITICAL CARE Performed by: Lyanne Co Total critical care time: 30 Critical care time was exclusive of separately billable procedures and treating other patients. Critical care was necessary to treat or prevent imminent or life-threatening deterioration. Critical care was time spent personally by me on the following activities: development of treatment plan with patient and/or surrogate as well as nursing,  discussions with consultants, evaluation of patient's response to treatment, examination of patient, obtaining history from patient or surrogate, ordering and performing treatments and interventions, ordering and review of laboratory studies, ordering and review of radiographic studies, pulse oximetry and re-evaluation of patient's condition.  Labs Reviewed  BASIC METABOLIC PANEL - Abnormal; Notable for the following:    Potassium 3.4 (*)     Glucose, Bld 115 (*)     GFR calc non Af Amer 52 (*)     GFR calc Af Amer 60 (*)     All other components within normal limits  CBC WITH DIFFERENTIAL - Abnormal; Notable for the following:    Hemoglobin 15.2 (*)     All other components within normal limits  PROTIME-INR  TROPONIN I  HEPARIN LEVEL (UNFRACTIONATED)  CBC  HEPARIN LEVEL (UNFRACTIONATED)   Dg Chest Portable 1 View  08/10/2011  *RADIOLOGY REPORT*  Clinical Data: Chest pain.  PORTABLE CHEST - 1 VIEW  Comparison: 12/20/2010.  Findings: The cardiac silhouette, mediastinal and hilar contours are within normal limits and stable.  The lungs are clear. Slightly low lung volumes with minimal streaky basilar atelectasis. No pleural effusion or pneumothorax.  IMPRESSION: Minimal streaky basilar atelectasis but no infiltrates or effusion.  Original Report Authenticated By: P. Loralie Champagne, M.D.    I personally reviewed the imaging tests through PACS system  I reviewed available ER/hospitalization records thought the EMR   1. Unstable angina       MDM  Cardiology to evaluate at the bedside  LB cards        Lyanne Co, MD 08/10/11 [phone]

## 2011-08-10 NOTE — Progress Notes (Signed)
ANTICOAGULATION CONSULT NOTE - Initial Consult  Pharmacy Consult for heparin Indication: chest pain/ACS  Allergies  Allergen Reactions  . Anesthetics, Amide     REACTION:FEVER, SYNCOPE  . Codeine Nausea And Vomiting    Reaction:Hallucinations  . Nitrofurantoin Nausea Only    REACTION: Hallucinations  . Orphenadrine Citrate Nausea And Vomiting    REACTION: Hallucination  . Pentosan Polysulfate Sodium     REACTION: rash/ severe headache  . Statins     Myalgias; taking fish oitl and co q 10 to make statins more tolerable    Patient Measurements: Height: 5' 1.02" (155 cm) Weight: 207 lb 3.7 oz (94 kg) IBW/kg (Calculated) : 47.86  Heparin Dosing Weight: 85kg  Vital Signs: BP: 175/71 mmHg (08/05 1615) Pulse Rate: 88  (08/05 1615)  Labs: No results found for this basename: HGB:2,HCT:3,PLT:3,APTT:3,LABPROT:3,INR:3,HEPARINUNFRC:3,CREATININE:3,CKTOTAL:3,CKMB:3,TROPONINI:3 in the last 72 hours  Estimated Creatinine Clearance: 68.7 ml/min (by C-G formula based on Cr of 0.82).   Medical History: Past Medical History  Diagnosis Date  . Hypothyroidism 1990  . Labile hypertension 08/1991  . HLD (hyperlipidemia) 01/1997  . Fibromyalgia   . Bladder spasms   . Basilar artery stenosis     90%  . CAD (coronary artery disease)     severe stenosis RCA with placement drug eluting stent   . IC (interstitial cystitis)   . Angina   . Shortness of breath   . Hypertension   . Blood transfusion   . Pneumonia   . CVA (cerebral infarction) December 2012  . Stroke    Assessment: 71 year old female presenting to [facility] with chest pain. Patient has significant past medical history for CAD and complaints of chest pain as recent as last April at which time she refused a stress test and cath. She is currently taking plavix, statin, and beta blocker prior to admit, will start IV heparin for anticoagulation.  Goal of Therapy:  Heparin level 0.3-0.7 units/ml Monitor platelets by anticoagulation  protocol: Yes   Plan:  Heparin bolus of 4000 units x 1 then drip rate of 1200 units/hr Daily CBC/HL Follow cardiac plan  Severiano Gilbert 08/10/2011,4:27 PM

## 2011-08-10 NOTE — ED Notes (Signed)
Cardiologist at bedside.  

## 2011-08-10 NOTE — Telephone Encounter (Signed)
PT calling stating was just walking in mall when developed CP(heaviness), radiation down left arm,diaphoresis and some nausea--advised to go to [place]--Geisla notified--nt

## 2011-08-10 NOTE — Telephone Encounter (Signed)
Agree. thanks

## 2011-08-10 NOTE — H&P (Signed)
History and Physical   Patient ID: Kelsey Dunn MRN: [id number], DOB/AGE: 01/31/1940   Admit date: 08/10/2011 Date of Consult: 08/10/2011   Primary Physician: Crawford Givens, MD Primary Cardiologist: Earney Hamburg, MD  Pt. Profile: Kelsey Dunn is a 71yo Caucasian female with PMHx significant for CAD (s/p DES to RCA 06/2009), HTN, HL, hypothyroidism and basilar artery stenosis who presents to [facility] ED today with chest pain.   HPI:   She had followed up with Dr. Clifton James on 04/2011 complaining of intermittent chest pain occurring mostly after walking. She denied associated symptoms at that time, but did endorse some occasional dizziness believed to be attributed to her basilar artery stenosis. The plan was to consider repeat cardiac catheterization if her chest pain worsened. She states that after that visit, her chest pain "leveled off" and did not occur as frequently. She walks 1 mile on the treadmill daily. Over the past 3 weeks, she has noticed having to decrease her activity level due to experiencing substernal chest pain described as heaviness with associated shortness of breath radiating to her L arm described as "arm numbness." She notes these episodes have been mostly exacerbated by exertion, and are shortly relieved after rest. She has been experiencing these reliably on exertion, however today, she went to [facility] after exercising, was at rest, and experienced the same type of pain. She denies associated lightheadedness, diaphoresis, n/v, LE edema, PND, orthopnea, fevers, chills, sick contacts. No tobacco, EtOH or elicit drug abuse. She reports medication compliance.   Problem List: Past Medical History  Diagnosis Date  . Hypothyroidism 1990  . Labile hypertension 08/1991  . HLD (hyperlipidemia) 01/1997  . Fibromyalgia   . Bladder spasms   . Basilar artery stenosis     90%  . CAD (coronary artery disease)     severe stenosis RCA with placement drug eluting stent   . IC (interstitial  cystitis)   . Angina   . Shortness of breath   . Hypertension   . Blood transfusion   . Pneumonia   . CVA (cerebral infarction) December 2012  . Stroke     Past Surgical History  Procedure Date  . Bladder suspension 1974  . Total abdominal hysterectomy 1974    dysmennorhea  . Cholecystectomy 1985  . Cardiac catheterization 1993    normal  . Renal angioplasty 1993    normal  . Carotid US 11/93    Occl L comm carotid  . Visual evoked response 11/93; 1/01    normal  . US renal/aorta 12/93    Normal  . Emg 1/01    LE, RUE normal  . Carotid US 4/98    L comm occl R-ok  . Carotid US 01/30/03    L occl  R less 40%  . Dexa 10/15/04    Troch -0.4 o/w pos  . Carotid arteriogram     bilat  . Bilateral vert & subclavian angiogram 07/19/08    85-90% stenosis mid basilar art; occluded left vert artery; occluded left comm carotid artery w/ collat flow  . Basilar artery angioplasty 09/04/08    sm right brachial hematoma  . Cath single vess dz 06/13/09    70% stenosis RCA EF 65-70%  . Coronary angioplasty with stent placement 06/19/09  . Cataract extraction w/ intraocular lens implant 2007-2008  . Eye surgery      Allergies:  Allergies  Allergen Reactions  . Anesthetics, Amide     REACTION:FEVER, SYNCOPE  . Codeine Nausea And Vomiting  Reaction:Hallucinations  . Nitrofurantoin Nausea Only    REACTION: Hallucinations  . Orphenadrine Citrate Nausea And Vomiting    REACTION: Hallucination  . Pentosan Polysulfate Sodium     REACTION: rash/ severe headache  . Statins     Myalgias; taking fish oitl and co q 10 to make statins more tolerable    Home Medications: Prior to Admission medications   Medication Sig Start Date End Date Taking? Authorizing Provider  amLODipine (NORVASC) 5 MG tablet Take 1 tablet (5 mg total) by mouth daily. 01/21/11 01/21/12 Yes Joaquim Nam, MD  bisacodyl (DULCOLAX) 5 MG EC tablet Take 5 mg by mouth daily as needed. For constipation   Yes Historical  Provider, MD  carvedilol (COREG) 6.25 MG tablet Take 6.25 mg by mouth 2 (two) times daily with a meal. 01/20/11  Yes Joaquim Nam, MD  clopidogrel (PLAVIX) 75 MG tablet Take 1 tablet (75 mg total) by mouth daily. 01/02/11  Yes Joaquim Nam, MD  co-enzyme Q-10 30 MG capsule Take 30 mg by mouth 2 (two) times daily.  12/22/10 12/22/11 Yes Layne Benton, NP  fish oil-omega-3 fatty acids 1000 MG capsule Take 1 g by mouth 2 (two) times daily. 12/22/10 12/22/11 Yes Layne Benton, NP  lansoprazole (PREVACID) 15 MG capsule Take 15 mg by mouth daily as needed. For heartburn   Yes Historical Provider, MD  levothyroxine (SYNTHROID, LEVOTHROID) 100 MCG tablet Take 100 mcg by mouth daily.   08/08/10  Yes Joaquim Nam, MD  losartan-hydrochlorothiazide (HYZAAR) 100-25 MG per tablet Take 1 tablet by mouth daily.   Yes Historical Provider, MD  nitroGLYCERIN (NITROSTAT) 0.4 MG SL tablet Place 0.4 mg under the tongue every 5 (five) minutes as needed. For chest pain   Yes Historical Provider, MD  simvastatin (ZOCOR) 10 MG tablet Take 10 mg by mouth daily at 6 PM. 01/02/11 01/02/12 Yes Joaquim Nam, MD  tetrahydrozoline 0.05 % ophthalmic solution Place 1 drop into both eyes as needed. For dry eyes   Yes Historical Provider, MD    Inpatient Medications:     . aspirin  325 mg Oral Once  . heparin  4,000 Units Intravenous Once  . nitroGLYCERIN  2-200 mcg/min Intravenous Once    (Not in a hospital admission)  Family History  Problem Relation Age of Onset  . Lung cancer Father   . Heart attack Mother   . Lymphoma Mother     s/p chemo  . Stroke Mother     x 2  . Diabetes Brother      History   Social History  . Marital Status: Married    Spouse Name: N/A    Number of Children: 1  . Years of Education: N/A   Occupational History  . Bookkeeper-part time    Social History Main Topics  . Smoking status: Never Smoker   . Smokeless tobacco: Never Used  . Alcohol Use: No  . Drug Use: No  .  Sexually Active: No   Other Topics Concern  . Not on file   Social History Narrative   Retired 2007 ([place] [employer])[place]     Review of Systems: General: negative for chills, fever, night sweats or weight changes.  Cardiovascular: positive for chest pain, shortness of breath, negative for dyspnea on exertion, edema, orthopnea, palpitations, paroxysmal nocturnal dyspnea or shortness of breath Dermatological: negative for rash Respiratory: negative for cough or wheezing Urologic: negative for hematuria Abdominal: negative for  nausea, vomiting, diarrhea, bright red blood per rectum, melena, or hematemesis Neurologic: positive for occasional dizziness,  negative for visual changes, syncope All other systems reviewed and are otherwise negative except as noted above.  Physical Exam: Blood pressure 175/71, pulse 88, resp. rate 20, height 5' 1.02" (1.55 m), weight 94 kg (207 lb 3.7 oz), SpO2 97.00%.    General: Elderly, obese, well developed, well nourished, in no acute distress. Head: Normocephalic, atraumatic, sclera non-icteric, no xanthomas, nares are without discharge.  Neck: Negative for carotid bruits. JVD not elevated. Lungs: Clear bilaterally to auscultation without wheezes, rales, or rhonchi. Breathing is unlabored. Heart: Regularly irregular, with S1 S2. No murmurs, rubs, or gallops appreciated. Abdomen: Soft, non-tender, non-distended with normoactive bowel sounds. No hepatomegaly. No rebound/guarding. No obvious abdominal masses. Msk:  Strength and tone appears normal for age. Extremities: No clubbing, cyanosis or edema.  Distal pedal pulses are 2+ and equal bilaterally. Neuro:  Alert and oriented X 3. Moves all extremities spontaneously. Psych:  Responds to questions appropriately with a normal affect.  Labs: Recent Labs  Basename 08/10/11 1606   WBC 7.6   HGB 15.2*   HCT 43.2   MCV 87.4   PLT 230   No results  found for this basename: VITAMINB12,FOLATE,FERRITIN,TIBC,IRON,RETICCTPCT in the last 72 hours No results found for this basename: DDIMER:2 in the last 72 hours No results found for this basename: NA:3,K:3,CL:3,CO2:3,BUN:3,CREATININE:3,CALCIUM:3,LABALBU,PROT,BILITOT,ALKPHOS,ALT,AST,AMYLASE,LIPASE,GLUCOSE:3 in the last 168 hours No results found for this basename: HGBA1C in the last 72 hours No results found for this basename: CKTOTAL:3,CKMB:3,CKMBINDEX:3,TROPONINI:3 in the last 72 hours No components found with this basename: POCBNP No results found for this basename: CHOL,HDL,LDLCALC,TRIG,CHOLHDL,LDLDIRECT in the last 72 hours No results found for this basename: TSH,T4TOTAL,FREET3,T3FREE,THYROIDAB in the last 72 hours  Radiology/Studies: Dg Chest Portable 1 View  08/10/2011  *RADIOLOGY REPORT*  Clinical Data: Chest pain.  PORTABLE CHEST - 1 VIEW  Comparison: 12/20/2010.  Findings: The cardiac silhouette, mediastinal and hilar contours are within normal limits and stable.  The lungs are clear. Slightly low lung volumes with minimal streaky basilar atelectasis. No pleural effusion or pneumothorax.  IMPRESSION: Minimal streaky basilar atelectasis but no infiltrates or effusion.  Original Report Authenticated By: P. Loralie Champagne, M.D.    EKG: NSR, RBBB, 86 bpm Initial ED EKG- NSR, NSVT, 68 bpm  ASSESSMENT:   1. Unstable angina/CAD 2. Mobitz, type 1/2/transient complete HB 3. HTN 4. HL 5. Hypothyroidism 6. Basilar artery stenosis  DISCUSSION/PLAN:  Please see attending note below.    Signed, R. Hurman Horn, PA-C 08/10/2011, 4:55 PM   As above, patient seen and examined. 71 year old female with past medical history of coronary artery disease status post drug-eluting stent to the right coronary artery in June of 2011, hypertension, hyperlipidemia, basilar artery stenosis admitted with chest pain. The patient states that over the past week she has had substernal chest pain radiating to her  left upper extremity. It occurs with walking on her treadmill and is relieved with rest. Her symptoms have slowly progressed with less activity. At approximately 2 PM today she developed chest pain while shopping. She describes a near syncopal episode. Her chest pain persisted and she presented to the emergency room. At the time of the evaluation she was continuing with chest pain. She was noted to have transient complete heart block on the monitor and a followup electrocardiogram showed sinus rhythm with Mobitz 1, RBBB. There was no clear ST elevation but there is slight lateral ST depression. The patient is having persistent  symptoms despite aspirin, heparin and nitroglycerin. Given previous RCA stent, persistent pain despite medications, transient complete heart block with worsening pain, I feel she is most likely having an inferior ischemia. Plan to proceed with emergent cardiac catheterization. The risks and benefits were discussed and the patient agrees to proceed. Plan to cycle enzymes. Continue aspirin and statin following procedure. Hold beta blocker for now until cardiac catheterization and possible stent complete. Resume following the procedure main when it is clear her rhythm is stable. Olga Millers 5:24 PM

## 2011-08-10 NOTE — ED Notes (Signed)
Pt is here with mid chest weight like feeling and left arm feels numb.  Pt has heart stent, and has a neck/ head blockage

## 2011-08-11 ENCOUNTER — Encounter (HOSPITAL_COMMUNITY): Payer: Self-pay | Admitting: Physician Assistant

## 2011-08-11 DIAGNOSIS — I441 Atrioventricular block, second degree: Secondary | ICD-10-CM

## 2011-08-11 LAB — CARDIAC PANEL(CRET KIN+CKTOT+MB+TROPI)
Relative Index: INVALID (ref 0.0–2.5)
Total CK: 64 U/L (ref 7–177)
Troponin I: <0.3 ng/mL (ref <0.30)

## 2011-08-11 LAB — CBC
HCT: 38.9 % (ref 36.0–46.0)
Hemoglobin: 13.4 g/dL (ref 12.0–15.0)
MCH: 30.2 pg (ref 26.0–34.0)
MCHC: 34.4 g/dL (ref 30.0–36.0)
MCV: 87.6 fL (ref 78.0–100.0)

## 2011-08-11 MED ORDER — PANTOPRAZOLE SODIUM 40 MG PO TBEC: 40.0000 mg | DELAYED_RELEASE_TABLET | Freq: Every day | ORAL | Status: DC

## 2011-08-11 MED ORDER — ASPIRIN 81 MG PO TBEC: 81.0000 mg | DELAYED_RELEASE_TABLET | Freq: Every day | ORAL | Status: DC

## 2011-08-11 NOTE — Discharge Summary (Addendum)
Discharge Summary   Patient ID: Kelsey Dunn,  MRN: [id number], DOB/AGE: [id number] y.o.  Admit date: 08/10/2011 Discharge date: 08/11/2011  Primary Physician: Crawford Givens, MD Primary Cardiologist: Earney Hamburg, MD  Discharge Diagnoses Principal Problem:   *Unstable angina  - Initial trop-I WNL in the ED  - Chest pain persisted on NTG gtt prompting cardiac catheterizatoin  - Cardiac cath 08/10/11: normal left main, luminal LAD irregularities, luminal LCx irregularities, luminal RCA irregularities and no ISR to mid-RCA DES, LVEF 60-65%  - Cardiac biomarkers WNL x 3   Active Problems:   Pure hypercholesterolemia  - Continued on fish oil and statin   ESSENTIAL HYPERTENSION  - Hypertensive while experiencing chest pain in the ED (SBP 180s)  - Well-controlled post-cath and with administration of antihypertensives   CAD, NATIVE VESSEL   - History of DES-mid RCA, widely patent with no evidence of ISR on cath  - Continued on ASA/Plavix/statin/NTG SL PRN  - Coreg held (see below)   Heart block AV second degree, type 2/Heart block AV third degree   - Both arrhythmias evident transiently in the ED during chest pain episode  - Evidence of incomplete RBBB noted as well  - No further incident of arrhythmia during admission  - Suspected to be due to conduction degeneration secondary to Coreg  - Beta blocker held, will be discharged and set up with event monitor outpatient  Allergies Allergies  Allergen Reactions  . Anesthetics, Amide     REACTION:FEVER, SYNCOPE  . Codeine Nausea And Vomiting    Reaction:Hallucinations  . Nitrofurantoin Nausea Only    REACTION: Hallucinations  . Orphenadrine Citrate Nausea And Vomiting    REACTION: Hallucination  . Pentosan Polysulfate Sodium     REACTION: rash/ severe headache  . Statins     Myalgias; taking fish oitl and co q 10 to make statins more tolerable    Diagnostic Studies/Procedures  PORTABLE CHEST X-RAY - 08/10/11     Comparison: 12/20/2010.  Findings: The cardiac silhouette, mediastinal and hilar contours  are within normal limits and stable. The lungs are clear.  Slightly low lung volumes with minimal streaky basilar atelectasis.  No pleural effusion or pneumothorax.  IMPRESSION:  Minimal streaky basilar atelectasis but no infiltrates or effusion.   CARDIAC CATHETERIZATION - 08/10/11  Hemodynamics:  AO 140/77  LV 153/14  Coronary angiography:  Coronary dominance: right  Left mainstem: No significant disease.  Left anterior descending (LAD): Luminal irregularities.  Left circumflex (LCx): There is a large ramus. Luminal irregularities in the LCx system.  Right coronary artery (RCA): There is a mid-RCA stent with no significant in-stent restenosis. Luminal irregularities in the RCA.  Left ventriculography: Left ventricular systolic function is normal, LVEF is estimated at 60-65%, there is no significant mitral regurgitation  Final Conclusions: No obstructive CAD. Patent RCA stent. No explanation for chest pain. She has intermittent probably type I 2nd degree AV block and transient complete heart block was seen in the ER.   History of Present Illness/Hospital Course  [name] is a 71yo female with the above problem list who was admitted to [facility] on 08/10/11 with chest pain concerning for unstable angina. She had experiencing intermittent angina associated with exercise, which had worsened over the preceding three weeks leading up to admission. She would experience a substernal chest pressure with radiation to her arms bilaterally and associated with shortness of breath occurring reliably while exercising on the treadmill, and relieved with several minutes of rest. These  episodes worsened, and had caused her to reduce her activity level in an effort to alleviate her symptoms. The date of admission, the patient exercised, experienced this same pain, and it remitted with rest. Later that day,  while at [facility], and at rest, she experienced this same sensation prompting her presentation to the ED. There, initial EKG revealed evidence of NSVT, but sinus without ischemic changes. Trop-I returned WNL. CBC, BMET and D-dimer returned WNL. CXR as above revealed no evidence of acute abnormality. She continued to experience chest pain uncontrolled despite NTG SL and gtt. During this time, significant ectopy and evidence of Mobitz, type 2 and complete heart block were transiently observed on the monitor. Given her cardiac history, ongoing chest pain and conduction disturbances, the decision was made by Dr. Jens Som to proceed with cardiac catheterization. She was informed, consented and underwent the procedure which is described in full above. This was notable for non-obstructive CAD and preserved EF. The recommendation was made to hold Coreg in light of the arrhythmias appreciated in the ED. No explanation for chest pain was described, however the patient does admittedly have significant anxiety. She was transferred to the CCU in good condition. There was no further evidence of arrhythmias on telemetry. Dr. Shirlee Latch assessed her this morning, and found her to be stable for discharge. Coreg will be discontinued and she will be set up with an event monitor tomorrow morning to further assess evidence of high-grade heart block this admission. This information, including activity restrictions has been clearly outlined in the discharge AVS.   Discharge Vitals:  Blood pressure 161/74, pulse 72, temperature 98.9 F (37.2 C), temperature source Oral, resp. rate 22, height 5' 1.02" (1.55 m), weight 94 kg (207 lb 3.7 oz), SpO2 96.00%.   Labs: Recent Labs  Basename 08/11/11 0248 08/10/11 1606   WBC 6.9 7.6   HGB 13.4 15.2*   HCT 38.9 43.2   MCV 87.6 87.4   PLT 201 230    Recent Labs  Basename 08/10/11 1926   DDIMER 0.24    Lab 08/11/11 0248 08/10/11 1606  NA -- 142  K -- 3.4*  CL -- 103  CO2 -- 30  BUN  -- 17  CREATININE 0.82 1.07  CALCIUM -- 9.9  PROT -- --  BILITOT -- --  ALKPHOS -- --  ALT -- --  AST -- --  AMYLASE -- --  LIPASE -- --  GLUCOSE -- 115*    Recent Labs  Basename 08/11/11 0853 08/11/11 0248 08/10/11 2139   CKTOTAL 63 64 75   CKMB 1.7 1.9 2.1   CKMBINDEX -- -- --   TROPONINI <0.30 <0.30 <0.30   Disposition:  Discharge Orders    Future Appointments: Provider: Department: Dept Phone: Center:   08/12/2011 9:30 AM [facility] [phone] [facility]   08/18/2011 11:30 AM Rosalio Macadamia, NP [facility] [phone] None   09/08/2011 3:00 PM Milas Gain, MD [facility] [phone] None   10/01/2011 12:15 PM Kathleene Hazel, MD [facility] [phone] [facility]     Follow-up Information    Follow up with [facility] on 08/12/2011. (At 11:00 AM for event monitor. )    Contact information:   [address] [place] [address]       Follow up with Norma Fredrickson, NP on 08/18/2011. (At 11:30 AM for follow-up after your hospitalization and review of event monitor.  )    Contact information:   [address] [place] [address] [phone]        Discharge Medications:  Medication List  As of 08/11/2011  3:11 PM   ASK your doctor about these medications         amLODipine 5 MG tablet   Commonly known as: NORVASC   Take 1 tablet (5 mg total) by mouth daily.      bisacodyl 5 MG EC tablet   Commonly known as: DULCOLAX      carvedilol 6.25 MG tablet   Commonly known as: COREG      clopidogrel 75 MG tablet   Commonly known as: PLAVIX   Take 1 tablet (75 mg total) by mouth daily.      co-enzyme Q-10 30 MG capsule      fish oil-omega-3 fatty acids 1000 MG capsule      lansoprazole 15 MG capsule   Commonly known as: PREVACID      levothyroxine 100 MCG tablet   Commonly known as: SYNTHROID, LEVOTHROID      losartan-hydrochlorothiazide  100-25 MG per tablet   Commonly known as: HYZAAR      nitroGLYCERIN 0.4 MG SL tablet   Commonly known as: NITROSTAT      simvastatin 10 MG tablet   Commonly known as: ZOCOR      tetrahydrozoline 0.05 % ophthalmic solution            Outstanding Labs/Studies: Event monitor on 08/12/11 at 11:00 AM  Duration of Discharge Encounter: Greater than 30 minutes including physician time.  Signed, R. Hurman Horn, PA-C 08/11/2011, 3:11 PM   ADDENDUM:   Patient ambulated with RN this afternoon. She did well initially, but experienced a sharp substernal chest pain, and became weak and presyncopal. No LOC or fall. She was helped back to her room and was asymptomatic on my assessment. Telemetry at that moment revealed significant ectopy similar to that recorded in the ED yesterday pre-cath. This was reviewed with Dr. Shirlee Latch who described it as transient RBBB and self-limiting Mobitz, type 1. He felt that her presyncopal episode represented a vasovagal reaction after experiencing chest pain. She is quite anxious, and anxiety may very well contribute to her chest pain. She will be set up with an event monitor and follow-up in the office in one week.    Jacqulyn Bath, PA-C 08/11/2011 3:11 PM

## 2011-08-11 NOTE — Progress Notes (Signed)
Patient ID: KADESHIA KASPARIAN, female   DOB: 06/16/1940, 71 y.o.   MRN: [id number]    SUBJECTIVE: No chest pain.  She did well overnight.  No events on telemetry.  She is now off Coreg.  ECG: NSR, incomplete RBBB      . amLODipine  5 mg Oral Daily  . aspirin EC  81 mg Oral Daily  . aspirin  325 mg Oral Once  . clopidogrel  75 mg Oral Q breakfast  . fentaNYL      . heparin      . heparin  4,000 Units Intravenous Once  . heparin  5,000 Units Subcutaneous Q8H  . hydrochlorothiazide  25 mg Oral Daily  . levothyroxine  100 mcg Oral QAC breakfast  . lidocaine      . losartan  100 mg Oral Daily  . midazolam      . nitroGLYCERIN      . nitroGLYCERIN  2-200 mcg/min Intravenous Once  . omega-3 acid ethyl esters  1 g Oral BID  . pantoprazole  40 mg Oral Q1200  . simvastatin  10 mg Oral q1800  . sodium chloride  3 mL Intravenous Q12H  . DISCONTD: aspirin  81 mg Oral Daily  . DISCONTD: fish oil-omega-3 fatty acids  1 g Oral BID  . DISCONTD: losartan-hydrochlorothiazide  1 tablet Oral Daily      Filed Vitals:   08/11/11 0400 08/11/11 0500 08/11/11 0600 08/11/11 0700  BP: 108/47 111/49 123/59 128/58  Pulse: 60 61 63 66  Temp: 97.7 F (36.5 C)     TempSrc: Oral     Resp: 15 16 15 12   Height:      Weight:      SpO2: 92% 93% 95% 95%    Intake/Output Summary (Last 24 hours) at 08/11/11 0726 Last data filed at 08/11/11 0700  Gross per 24 hour  Intake    692 ml  Output    150 ml  Net    542 ml    LABS: Basic Metabolic Panel:  Basename 08/11/11 0248 08/10/11 1606  NA -- 142  K -- 3.4*  CL -- 103  CO2 -- 30  GLUCOSE -- 115*  BUN -- 17  CREATININE 0.82 1.07  CALCIUM -- 9.9  MG -- --  PHOS -- --   Liver Function Tests: No results found for this basename: AST:2,ALT:2,ALKPHOS:2,BILITOT:2,PROT:2,ALBUMIN:2 in the last 72 hours No results found for this basename: LIPASE:2,AMYLASE:2 in the last 72 hours CBC:  Basename 08/11/11 0248 08/10/11 1606  WBC 6.9 7.6  NEUTROABS --  4.7  HGB 13.4 15.2*  HCT 38.9 43.2  MCV 87.6 87.4  PLT 201 230   Cardiac Enzymes:  Basename 08/11/11 0248 08/10/11 2139 08/10/11 1606  CKTOTAL 64 75 --  CKMB 1.9 2.1 --  CKMBINDEX -- -- --  TROPONINI <0.30 <0.30 <0.30   BNP: No components found with this basename: POCBNP:3 D-Dimer:  Basename 08/10/11 1926  DDIMER 0.24   Hemoglobin A1C: No results found for this basename: HGBA1C in the last 72 hours Fasting Lipid Panel: No results found for this basename: CHOL,HDL,LDLCALC,TRIG,CHOLHDL,LDLDIRECT in the last 72 hours Thyroid Function Tests: No results found for this basename: TSH,T4TOTAL,FREET3,T3FREE,THYROIDAB in the last 72 hours Anemia Panel: No results found for this basename: VITAMINB12,FOLATE,FERRITIN,TIBC,IRON,RETICCTPCT in the last 72 hours  RADIOLOGY: Dg Chest Portable 1 View  08/10/2011  *RADIOLOGY REPORT*  Clinical Data: Chest pain.  PORTABLE CHEST - 1 VIEW  Comparison: 12/20/2010.  Findings: The cardiac silhouette, mediastinal and  hilar contours are within normal limits and stable.  The lungs are clear. Slightly low lung volumes with minimal streaky basilar atelectasis. No pleural effusion or pneumothorax.  IMPRESSION: Minimal streaky basilar atelectasis but no infiltrates or effusion.  Original Report Authenticated By: P. Loralie Champagne, M.D.    PHYSICAL EXAM General: NAD Neck: No JVD, no thyromegaly or thyroid nodule.  Lungs: Clear to auscultation bilaterally with normal respiratory effort. CV: Nondisplaced PMI.  Heart regular S1/S2, no S3/S4, no murmur.  No peripheral edema.  No carotid bruit.  Normal pedal pulses.  Abdomen: Soft, nontender, no hepatosplenomegaly, no distention.  Neurologic: Alert and oriented x 3.  Psych: Normal affect. Extremities: No clubbing or cyanosis.  Right groin with small ecchymosis, no hematoma  TELEMETRY: Reviewed telemetry pt in NSR, no significant arrhythmic events  ASSESSMENT AND PLAN: 71 yo with history of CAD s/p RCA DES  presented with chest pain and Type I 2nd degree AV block with new RBBB and 1 episode of higher grade heart block (multiple non-conducted Ps in a row) noted in the ER.  She had LHC with no obstructive CAD and LV-gram showed normal EF.  Coreg was stopped.  Overnight, she has had no telemetry events.  ? If arrhythmias were due to conduction system degeneration in the setting of Coreg.   - Monitor on telemetry through this afternoon, stay off Coreg.  - If no further significant events, plan to discharge home this afternoon with event monitor.   - Would consider sleep study if she has not had one (? Arrhythmias related to underlying OSA).   Marca Ancona 08/11/2011 7:30 AM

## 2011-08-12 ENCOUNTER — Encounter (INDEPENDENT_AMBULATORY_CARE_PROVIDER_SITE_OTHER): Payer: Medicare HMO

## 2011-08-12 DIAGNOSIS — I2510 Atherosclerotic heart disease of native coronary artery without angina pectoris: Secondary | ICD-10-CM

## 2011-08-18 ENCOUNTER — Encounter: Payer: Self-pay | Admitting: Nurse Practitioner

## 2011-08-18 ENCOUNTER — Ambulatory Visit (INDEPENDENT_AMBULATORY_CARE_PROVIDER_SITE_OTHER): Payer: Medicare HMO | Admitting: Nurse Practitioner

## 2011-08-18 VITALS — BP 140/72 | HR 73 | Ht 62.0 in | Wt 207.8 lb

## 2011-08-18 DIAGNOSIS — I441 Atrioventricular block, second degree: Secondary | ICD-10-CM

## 2011-08-18 DIAGNOSIS — I200 Unstable angina: Secondary | ICD-10-CM

## 2011-08-18 MED ORDER — PANTOPRAZOLE SODIUM 40 MG PO TBEC: 40.0000 mg | DELAYED_RELEASE_TABLET | Freq: Every day | ORAL | Status: DC

## 2011-08-18 NOTE — Progress Notes (Signed)
[name] Date of Birth: October 14, 1940 Medical Record #[id number]  History of Present Illness: Kelsey Dunn is seen today for a post hospital visit. She is seen for Dr. Clifton James. She has a history of known CAD with prior stenting of the RCA. She remains on Plavix. Her other problems include HTN, HLD, and obesity.  She comes in today. She is here alone. She was hospitalized about a week ago with chest pain and presyncope. She had repeat cardiac cath showing her stent to be patent and luminal irregularities. EF was normal at 65%. She was noted to have some rhythm issues with heart block, transient R BBB and self limiting Mobitz type 1 and possibly complete heart block. Her Coreg was stopped. An event monitor was placed. She is doing ok. Very scared and anxious. Has not resumed her walking on the treadmill. Prior to this recent hospitalization, she was able to walk about a mile a day but recently was feeling chest pain and presyncopal. No frank syncope. Her Prevacid was switched over to Protonix but she has not picked this up at the drug store. There was also concern that her anxiety was contributing to her chest pain. She has had no problems with her cath site.   Current Outpatient Prescriptions on File Prior to Visit  Medication Sig Dispense Refill  . amLODipine (NORVASC) 5 MG tablet Take 1 tablet (5 mg total) by mouth daily.  90 tablet  3  . bisacodyl (DULCOLAX) 5 MG EC tablet Take 5 mg by mouth daily as needed. For constipation      . clopidogrel (PLAVIX) 75 MG tablet Take 1 tablet (75 mg total) by mouth daily.  30 tablet  11  . co-enzyme Q-10 30 MG capsule Take 30 mg by mouth 2 (two) times daily.       . fish oil-omega-3 fatty acids 1000 MG capsule Take 1 g by mouth 2 (two) times daily.      Marland Kitchen levothyroxine (SYNTHROID, LEVOTHROID) 100 MCG tablet Take 100 mcg by mouth daily.        Marland Kitchen losartan-hydrochlorothiazide (HYZAAR) 100-25 MG per tablet Take 1 tablet by mouth daily.      . nitroGLYCERIN  (NITROSTAT) 0.4 MG SL tablet Place 0.4 mg under the tongue every 5 (five) minutes as needed. For chest pain      . tetrahydrozoline 0.05 % ophthalmic solution Place 1 drop into both eyes as needed. For dry eyes        Allergies  Allergen Reactions  . Anesthetics, Amide     REACTION:FEVER, SYNCOPE  . Codeine Nausea And Vomiting    Reaction:Hallucinations  . Nitrofurantoin Nausea Only    REACTION: Hallucinations  . Orphenadrine Citrate Nausea And Vomiting    REACTION: Hallucination  . Pentosan Polysulfate Sodium     REACTION: rash/ severe headache  . Statins     Myalgias; taking fish oitl and co q 10 to make statins more tolerable    Past Medical History  Diagnosis Date  . Hypothyroidism 1990  . Labile hypertension 08/1991  . HLD (hyperlipidemia) 01/1997  . Fibromyalgia   . Bladder spasms   . Basilar artery stenosis     90%  . CAD (coronary artery disease)     a) 06/2009 cath: severe stenosis RCA with placement drug eluting stent  b) 08/2011 cath: normal left main, luminal LAD irregularities, luminal LCx irregularities, luminal RCA irregularities and no ISR to mid-RCA DES, LVEF 60-65%  . IC (interstitial cystitis)   .  Angina   . Shortness of breath   . Hypertension   . Blood transfusion   . Pneumonia   . CVA (cerebral infarction) December 2012  . Stroke     Past Surgical History  Procedure Date  . Bladder suspension 1974  . Total abdominal hysterectomy 1974    dysmennorhea  . Cholecystectomy 1985  . Cardiac catheterization 1993    normal  . Renal angioplasty 1993    normal  . Carotid US 11/93    Occl L comm carotid  . Visual evoked response 11/93; 1/01    normal  . US renal/aorta 12/93    Normal  . Emg 1/01    LE, RUE normal  . Carotid US 4/98    L comm occl R-ok  . Carotid US 01/30/03    L occl  R less 40%  . Dexa 10/15/04    Troch -0.4 o/w pos  . Carotid arteriogram     bilat  . Bilateral vert & subclavian angiogram 07/19/08    85-90% stenosis mid  basilar art; occluded left vert artery; occluded left comm carotid artery w/ collat flow  . Basilar artery angioplasty 09/04/08    sm right brachial hematoma  . Cath single vess dz 06/13/09    70% stenosis RCA EF 65-70%  . Coronary angioplasty with stent placement 06/19/09  . Cataract extraction w/ intraocular lens implant 2007-2008  . Eye surgery     History  Smoking status  . Never Smoker   Smokeless tobacco  . Never Used    History  Alcohol Use No    Family History  Problem Relation Age of Onset  . Lung cancer Father   . Heart attack Mother   . Lymphoma Mother     s/p chemo  . Stroke Mother     x 2  . Diabetes Brother     Review of Systems: The review of systems is per the HPI.  All other systems were reviewed and are negative.  Physical Exam: BP 140/72  Pulse 73  Ht 5\' 2"  (1.575 m)  Wt 207 lb 12.8 oz (94.257 kg)  BMI 38.01 kg/m2  SpO2 97% Patient is very pleasant and in no acute distress. She is obese. Skin is warm and dry. Color is normal.  HEENT is unremarkable. Normocephalic/atraumatic. PERRL. Sclera are nonicteric. Neck is supple. No masses. No JVD. Lungs are clear. Cardiac exam shows a regular rate and rhythm. Abdomen is soft. Extremities are without edema. Gait and ROM are intact. No gross neurologic deficits noted.  LABORATORY DATA:  Her event monitor has been reviewed from the past few days and shows sinus rhythm. Some artifact noted. No arrhythmia.   . Lab Results  Component Value Date   WBC 6.9 08/11/2011   HGB 13.4 08/11/2011   HCT 38.9 08/11/2011   PLT 201 08/11/2011   GLUCOSE 115* 08/10/2011   CHOL 127 06/02/2011   TRIG 142 06/02/2011   HDL 38* 06/02/2011   LDLDIRECT 116.5 08/07/2010   LDLCALC 61 06/02/2011   ALT 17 06/02/2011   AST 17 06/02/2011   NA 142 08/10/2011   K 3.4* 08/10/2011   CL 103 08/10/2011   CREATININE 0.82 08/11/2011   BUN 17 08/10/2011   CO2 30 08/10/2011   TSH 0.529 06/02/2011   INR 1.01 08/10/2011   HGBA1C 5.7* 06/02/2011    Assessment /  Plan:

## 2011-08-18 NOTE — Assessment & Plan Note (Signed)
She apparently had some issues with her rhythm during this most recent admission. She now has an event monitor in place. I have asked her to get back into her walking program, especially now since she has the monitor in place. Will leave her off of her Coreg. We will see her back next month or sooner if the monitor shows recurrent problems. Patient is agreeable to this plan and will call if any problems develop in the interim.

## 2011-08-18 NOTE — Assessment & Plan Note (Addendum)
Her cath was ok. Stent is patent. She will continue with medical management. She has an event monitor in place to further assess for rhythm issues. She is off of her Coreg. I have asked her to take the Protonix daily at least until seen back in follow up to help with sorting her symptoms.

## 2011-08-18 NOTE — Patient Instructions (Addendum)
Stay on your current medicines but take the Protonix daily in place of the Prevacid  You may start back with your walking  Keep wearing the event monitor  Keep your next appointment with Dr. Clifton James  Call the [facility] office at [phone] if you have any questions, problems or concerns.

## 2011-08-25 ENCOUNTER — Telehealth: Payer: Self-pay | Admitting: Cardiovascular Disease

## 2011-08-25 DIAGNOSIS — I2510 Atherosclerotic heart disease of native coronary artery without angina pectoris: Secondary | ICD-10-CM

## 2011-08-25 MED ORDER — ISOSORBIDE MONONITRATE ER 30 MG PO TB24: 30.0000 mg | ORAL_TABLET | Freq: Every day | ORAL | Status: DC

## 2011-08-25 NOTE — Telephone Encounter (Signed)
Spoke with pt and reviewed instructions from Dr. Clifton James. She will be here for treadmill with Dr. Clifton James on 8/23 at 8:15. I verbally went over all instructions for treadmill with her.  Will send Imdur to [facility] on [address] In [place].

## 2011-08-25 NOTE — Telephone Encounter (Signed)
Pt wearing heart monitor, to walk on treadmill 10 minutes a day, gets a tightness in chest, sweaty, hacking cough, wants to know if anything showed up, what to do , feels cannot walk on the treadmill anymore, pls advise

## 2011-08-25 NOTE — Telephone Encounter (Signed)
Spoke with pt. She is not having chest pain, coughing or shortness of breath at present time.  She reports she has been having chest pain and hacky cough when she walks on treadmill or is she gone from home too long.  States she had pain this AM on treadmill and heart rate ranged from 60-130.  Has been trying to walk on treadmill since August 18, 2011 after office visit with Norma Fredrickson, NP.  Strips from August 18 and 19 reviewed with Shawn Route, NP.  Per Lawson Fiscal pt will probably need change in medications and to review with Dr. Clifton James when in office this afternoon.  I spoke with pt and gave her this information.  I have instructed her not to walk on treadmill and to rest today until she hears from [place]. I also told her if she should have return of chest pain she should call EMS.   No strips from this AM available yet.  Will try and obtain these

## 2011-08-25 NOTE — Telephone Encounter (Signed)
I have spoken Kelsey Dunn today. Will arrange ETT to assess for rate related bundle block. Will add Imdur 30 mg po Qdaily. cdm

## 2011-08-26 ENCOUNTER — Other Ambulatory Visit: Payer: Self-pay | Admitting: Family Medicine

## 2011-08-28 ENCOUNTER — Encounter: Payer: Self-pay | Admitting: Cardiovascular Disease

## 2011-08-28 ENCOUNTER — Ambulatory Visit (INDEPENDENT_AMBULATORY_CARE_PROVIDER_SITE_OTHER): Payer: Medicare HMO | Admitting: Cardiovascular Disease

## 2011-08-28 DIAGNOSIS — I2510 Atherosclerotic heart disease of native coronary artery without angina pectoris: Secondary | ICD-10-CM

## 2011-08-28 DIAGNOSIS — R079 Chest pain, unspecified: Secondary | ICD-10-CM

## 2011-08-28 NOTE — Procedures (Addendum)
Exercise Treadmill Test  Pre-Exercise Testing Evaluation Rhythm: normal sinus  Rate: 70   PR:  .16 QRS:  .09  QT:  .43 QTc: .46     Test  Exercise Tolerance Test Ordering MD: Melene Muller, MD  Interpreting MD: Melene Muller, MD  Unique Test No: 1  Treadmill:  1  Indication for ETT: chest pain - rule out ischemia  Contraindication to ETT: No   Stress Modality: exercise - treadmill  Cardiac Imaging Performed: non   Protocol: standard Bruce - maximal  Max BP:  179/56  Max MPHR (bpm):  152 85% MPR (bpm):  129  MPHR obtained (bpm):  121 % MPHR obtained:  80  Reached 85% MPHR (min:sec):  N/a Total Exercise Time (min-sec):  81min-51sec  Workload in METS:  4.3 Borg Scale: 15  Reason ETT Terminated:  atypical chest pain    ST Segment Analysis At Rest: normal ST segments - no evidence of significant ST depression With Exercise: no evidence of significant ST depression  Other Information Arrhythmia:  Yes Angina during ETT:  present (1) Quality of ETT:  non-diagnostic  ETT Interpretation:  No ST changes however pt did develop a rate related LBBB and had onset of chest tightness.   Comments: Pt exercised for one minute and 51 seconds and then had the onset of chest pain at the exact time that she developed rate related LBBB at a HR of 115 beats per minute. At rest, her chest pain resolved and her LBBB resolved.   Recommendations: Will review findings with EP and then f/u with patient. No indication for a pacemaker in this situation since she is not bradycardia or high grade heart block.

## 2011-09-01 ENCOUNTER — Telehealth: Payer: Self-pay | Admitting: Cardiovascular Disease

## 2011-09-01 NOTE — Telephone Encounter (Signed)
I spoke to Ms. Stifter this am. She continues to have symptoms with exercise. Exercise test last week with rate related LBBB. She will turn monitor in tomorrow. I will arrange for her to see one of our EP doctors for evaluation. She agrees to this plan. Will cancel f/u with me in September and change that to 3 months. Dennie Bible, can you help? Thanks, chris

## 2011-09-03 ENCOUNTER — Telehealth: Payer: Self-pay | Admitting: Cardiovascular Disease

## 2011-09-03 DIAGNOSIS — I447 Left bundle-branch block, unspecified: Secondary | ICD-10-CM

## 2011-09-03 NOTE — Telephone Encounter (Signed)
Mrs Meiners called regarding this appt.  Referral was ordered, call was transferred to check out to schedule the appt.

## 2011-09-03 NOTE — Telephone Encounter (Signed)
Pt called regarding EP appt.  I ordered the referral per Dr Clifton James and transferred her to the appt desk to schedule the appt.

## 2011-09-03 NOTE — Telephone Encounter (Signed)
Please return call to patient [phone] regarding medical questions

## 2011-09-08 ENCOUNTER — Encounter: Payer: Self-pay | Admitting: Neurology

## 2011-09-08 ENCOUNTER — Ambulatory Visit (INDEPENDENT_AMBULATORY_CARE_PROVIDER_SITE_OTHER): Payer: Medicare HMO | Admitting: Neurology

## 2011-09-08 VITALS — BP 150/78 | HR 84 | Ht 61.0 in | Wt 210.0 lb

## 2011-09-08 DIAGNOSIS — I679 Cerebrovascular disease, unspecified: Secondary | ICD-10-CM

## 2011-09-08 NOTE — Progress Notes (Signed)
Dear Dr. Clifton James,  Thank you for having me see Kelsey Dunn in followup today at [facility] for her problem with left carotid occlusion and basilar artery stenosis. As you may recall, she is a 71 y.o. year old female with a history of of hypertension, hyperlipidemia, CAD who in June 2010 had a spell where she turned over in bed and lost her vision, lasting minutes. It was accompanied by a feeling of being "drunk" with difficulty walking lasting days afterwards. She had no double vision, difficulty swallowing, or speaking during this time. She was found at that time to have a severe stenosis of the basilar artery on MRA as well as a left common carotid occlusion. MRI did not reveal any ischemic stroke at that time. She underwent a cerebral angiogram at that time, which also revealed the occluded left common carotid, as well as a 85-90% stenosis of the basilar artery. 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 There was filling of the left ECA branches and left ICA from musculoskeletal collaterals. There was also cross filling of the left ACA and left MCA from the right ACA. There was also a right PCOM filling the right PCA and SCA. In August of 2010, she underwent a failed stenting(because of a tortuous right vertebral) but successful angioplasty of the basilar.   While the patient reports that she can't remember if her symptoms of dizziness got better with the angioplasty, she does say that she has had no further episodes of loss of vision. Her last angiogram in August 2011 revealed a 25-30% stenosis of her basilar. At that time she remained asymptomatic. She was maintained on aspirin 325 and plavix 75mg .  In December of 2012 she had an episode of lightheadedness without loss of vision, double vision, difficulty swallowing or speaking. An MRI brain revealed a possible acute non hemorrhagic infarct in the anterior left frontal lobe(thought to be unrelated  to her symptoms). CTA revealed a 40% stenosis of the basilar. Interestingly, the left vertebral was not noted to be occluded on CTA. Left CCA was occluded but ICA was being filled by collaterals. Echo revealed 60-65% EF.  Patient is currently largely asymptomatic. She does have some feeling of being "drunk" at times. It seems this can be brought on by quick head movements. No other brainstem symptoms. No loss of vision spells. She is currently only on Plavix as she was instructed by Dr. Pearlean Brownie to stop her aspirin. She also has a feeling like she is going to fall over at times, usually to the right. She has not fallen.   ------------------------------------------  At her last appointment, I felt she was essentially asymptomatic from her cerebrovascular disease.  I checked a cholesterol that revealed an LDL in the 60s.  She was to remain on Plavix only.  She has had no other neurologic symptoms.  She feels the feeling of falling to the right has gradually improved, as well as the feeling of being drunk.  She has had no loss of vision or dizziness or double vision or difficulty speaking.  She does continue to complain of numbness and tingling in her left hand > right, as well as left leg, greater than right.  This has been stable for years.  In the interim she was admitted to hospital for a transient 2nd degree HB manifesting as chest pain and dizziness.  Angiogram was unremarkable, and her carvedilol was stopped.  She is scheduled to follow  up with one of the electrophysiologists.    Medical history, social history, and family history were reviewed and have not changed since the last clinic visit.  Current Outpatient Prescriptions on File Prior to Visit  Medication Sig Dispense Refill  . amLODipine (NORVASC) 5 MG tablet Take 1 tablet (5 mg total) by mouth daily.  90 tablet  3  . bisacodyl (DULCOLAX) 5 MG EC tablet Take 5 mg by mouth daily as needed. For constipation      . clopidogrel (PLAVIX) 75 MG  tablet Take 1 tablet (75 mg total) by mouth daily.  30 tablet  11  . co-enzyme Q-10 30 MG capsule Take 30 mg by mouth 2 (two) times daily.       . fish oil-omega-3 fatty acids 1000 MG capsule Take 1 g by mouth 2 (two) times daily.      . isosorbide mononitrate (IMDUR) 30 MG 24 hr tablet Take 1 tablet (30 mg total) by mouth daily.  30 tablet  6  . levothyroxine (SYNTHROID, LEVOTHROID) 100 MCG tablet TAKE 1 TABLET DAILY AS DIRECTED  90 tablet  2  . losartan-hydrochlorothiazide (HYZAAR) 100-25 MG per tablet Take 1 tablet by mouth daily.      . nitroGLYCERIN (NITROSTAT) 0.4 MG SL tablet Place 0.4 mg under the tongue every 5 (five) minutes as needed. For chest pain      . pantoprazole (PROTONIX) 40 MG tablet Take 1 tablet (40 mg total) by mouth daily.  30 tablet  11  . simvastatin (ZOCOR) 10 MG tablet Take 10 mg by mouth at bedtime.       Marland Kitchen tetrahydrozoline 0.05 % ophthalmic solution Place 1 drop into both eyes as needed. For dry eyes      . DISCONTD: levothyroxine (SYNTHROID, LEVOTHROID) 100 MCG tablet Take 100 mcg by mouth daily.          Allergies  Allergen Reactions  . Anesthetics, Amide     REACTION:FEVER, SYNCOPE  . Codeine Nausea And Vomiting    Reaction:Hallucinations  . Nitrofurantoin Nausea Only    REACTION: Hallucinations  . Orphenadrine Citrate Nausea And Vomiting    REACTION: Hallucination  . Pentosan Polysulfate Sodium     REACTION: rash/ severe headache  . Statins     Myalgias; taking fish oitl and co q 10 to make statins more tolerable    ROS:  13 systems were reviewed and are notable for numbness and tingling in the extremities as above..  All other review of systems are unremarkable.  Exam: . Filed Vitals:   09/08/11 1458  BP: 150/78  Pulse: 84  Height: 5\' 1"  (1.549 m)  Weight: 210 lb (95.255 kg)    In general, well appearing women.  Mental status:   The patient is oriented to person, place and time. Recent and remote memory are intact. Attention span and  concentration are normal. Language including repetition, naming, following commands are intact. Fund of knowledge of current and historical events, as well as vocabulary are normal.  Cranial Nerves: Pupils are equally round and reactive to light. Visual fields full to confrontation. Extraocular movements are intact without nystagmus. Facial sensation and muscles of mastication are intact. Muscles of facial expression are symmetric. T/U/P midline  Motor:  Normal bulk and tone, no drift and 5/5 muscle strength bilaterally.  Reflexes:  2+ RUE, 1+ LUE, 2+ LE, except absent left ankle.  Coordination:  Normal finger to nose  Gait:  Normal gait and station.  Romberg negative.  + Tinel's  at wrist on left.  Impression/Recommendations:  1.  Diffuse cerebrovascular disease - I have no evidence that her basilar stenosis is symptomatic at this time.  She will continue on Plavix 75 daily and she will continue to try to exercise.  Her cholesterol is very well controlled at this time.  However, given the complexity of her case and her need for ongoing follow up I would suggest that she see Dr. Pearlean Brownie back in clinic as I will be leaving.  In any case,  he is a stroke neurologist so  I think he is more qualified to follow her than I am.  I will leave further imaging up to him. 2.  Numbness and tingling in her hands and feet.  It's asymmetry suggests a compressive cause such as CTS or radiculopathy.  However, given normal PN labs I will not work up further at this time due to its stability.  I will arrange for the patient to see Dr. Pearlean Brownie back.  Lupita Raider Modesto Charon, MD [facility], [place]

## 2011-09-09 ENCOUNTER — Telehealth (HOSPITAL_COMMUNITY): Payer: Self-pay

## 2011-09-09 ENCOUNTER — Other Ambulatory Visit (HOSPITAL_COMMUNITY): Payer: Self-pay | Admitting: Interventional Radiology

## 2011-09-09 DIAGNOSIS — I651 Occlusion and stenosis of basilar artery: Secondary | ICD-10-CM

## 2011-09-09 DIAGNOSIS — I771 Stricture of artery: Secondary | ICD-10-CM

## 2011-09-09 NOTE — Telephone Encounter (Signed)
I spoke with Kelsey Dunn today.  She is going to come see Dr. Corliss Skains for a F/U visit Friday.  She stated that she has lost 40 + pounds and is walking 1 mile a day.  She also stated that she was feeling well.  She went to see Dr. Modesto Charon recently.  I advised her that we would see her Friday at 1130

## 2011-09-11 ENCOUNTER — Ambulatory Visit (HOSPITAL_COMMUNITY)
Admission: RE | Admit: 2011-09-11 | Discharge: 2011-09-11 | Disposition: A | Discharge status: Home / Self Care | Payer: Medicare HMO | Source: Ambulatory Visit | Attending: Interventional Radiology | Admitting: Interventional Radiology

## 2011-09-11 ENCOUNTER — Telehealth (HOSPITAL_COMMUNITY): Payer: Self-pay

## 2011-09-11 DIAGNOSIS — I771 Stricture of artery: Secondary | ICD-10-CM

## 2011-09-11 DIAGNOSIS — I651 Occlusion and stenosis of basilar artery: Secondary | ICD-10-CM

## 2011-09-11 NOTE — Telephone Encounter (Signed)
Dr. Corliss Skains saw Mrs. Degrave in consult. Per the consult he wants an MRI/MRA in Dec. 2013//

## 2011-10-01 ENCOUNTER — Ambulatory Visit: Payer: Medicare HMO | Admitting: Cardiovascular Disease

## 2011-10-05 ENCOUNTER — Ambulatory Visit (INDEPENDENT_AMBULATORY_CARE_PROVIDER_SITE_OTHER): Payer: Medicare HMO | Admitting: Internal Medicine

## 2011-10-05 ENCOUNTER — Encounter: Payer: Self-pay | Admitting: Internal Medicine

## 2011-10-05 VITALS — BP 142/66 | HR 87 | Ht 62.0 in | Wt 209.0 lb

## 2011-10-05 DIAGNOSIS — I2510 Atherosclerotic heart disease of native coronary artery without angina pectoris: Secondary | ICD-10-CM

## 2011-10-05 DIAGNOSIS — I441 Atrioventricular block, second degree: Secondary | ICD-10-CM

## 2011-10-05 DIAGNOSIS — I10 Essential (primary) hypertension: Secondary | ICD-10-CM

## 2011-10-05 NOTE — Assessment & Plan Note (Signed)
S/p prior PCI Presently off of beta blockers due to prior symptoms with second degree AV block  No changes today

## 2011-10-05 NOTE — Assessment & Plan Note (Signed)
The patients has been previously documented to have a RBBB with second degree AV block.  This has improved off of coreg but not resolved.  She also has exertional LBBB demonstrated with symptoms of fatigue and decreased exercise tolerance.  She reports exertional presyncope but has never had syncope.  I have reviewed her event monitor which does not show significant AV block, however, I am worried that her h/o both documented RBBB and LBBB suggest that she has advanced conduction system disease.  Her exertional LBBB with preserved EF is noted.  She did not have prolonged AV block or significant bradycardia with the episode.  I am therefore not convinced that a pacemaker would provide benefit to her symptoms (which appear to primarily accompany LBBB).  She may actually feel worse with RV pacing.  She has a preserved EF and therefore does not meet indication for CRT at this time. Dr Ladona Ridgel and I have reviewed her electrograms in the office today.  Dr Ladona Ridgel would advised a strategy of watchful waiting.  The patient is clear that she would like to avoid any EP procedures at this point. We will therefore continue to monitor her clinically.  She will follow-up with me in 6 weeks.  We may consider repeating her gxt at that time.  She will contact me if her exertional symptoms worsen or she develops further presyncope/ syncope in the interim.  I would anticipate that she may require a pacemaker if her AV block advances down the road.

## 2011-10-05 NOTE — Assessment & Plan Note (Signed)
Stable No change required today  Avoid AV nodal agents

## 2011-10-05 NOTE — Progress Notes (Signed)
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 female with a h/o obesity, hypertension, preserved EF, and AV conduction disease who presents for EP consultation.  She reports initially developing exertional shortness of breath and palpitations 08/10/11.  She presented to [facility] and was observed to have second degree AV block demonstrated with RBBB.  Notes during that hospital visit support multiple consecutive P waves not conducted however I do not have strips to document this for review.  She had cath which revealed no lesions requiring revascularization.  Her coreg was stopped.  She was discharged and wore an event monitor.  This did not document arrhythmias or advanced AV block.  There were no episodes of AV block documented.  She was further evaluated by Dr Clifton Dakarri Kessinger and a GXT was performed.  She was observed during the study to have a LBBB with exertion.  Second degree AV block was observed but with no consecutive P waves not conducted.  She developed exertional symptoms of fatigue, SOB, and CP which corresponded to her LBBB.   Since that time, she has done reasonably well.  She reports that her exercise tolerance is somewhat limited.  She can walk 1 mile on the treadmill without difficulty however if she "pushes herself too hard", she gets SOB.  She reports having several episodes of exertional presyncope.  She has never had presyncope at rest or exertional syncope.    Past Medical History  Diagnosis Date  . Hypothyroidism 1990  . Labile hypertension 08/1991  . HLD (hyperlipidemia) 01/1997  . Fibromyalgia   . Bladder spasms   . Basilar artery stenosis     90%  . CAD (coronary artery disease)     a) 06/2009 cath: severe stenosis RCA with placement drug eluting stent  b) 08/2011 cath: normal left main, luminal LAD irregularities, luminal LCx irregularities, luminal RCA irregularities and no ISR to mid-RCA DES, LVEF 60-65%    . IC (interstitial cystitis)   . Obesity   . Hypertension   . Blood transfusion   . Pneumonia   . CVA (cerebral infarction) December 2012   Past Surgical History  Procedure Date  . Bladder suspension 1974  . Total abdominal hysterectomy 1974    dysmennorhea  . Cholecystectomy 1985  . Cardiac catheterization 1993    normal  . Renal angioplasty 1993    normal  . Carotid US 11/93    Occl L comm carotid  . Visual evoked response 11/93; 1/01    normal  . US renal/aorta 12/93    Normal  . Emg 1/01    LE, RUE normal  . Carotid US 4/98    L comm occl R-ok  . Carotid US 01/30/03    L occl  R less 40%  . Dexa 10/15/04    Troch -0.4 o/w pos  . Carotid arteriogram     bilat  . Bilateral vert & subclavian angiogram 07/19/08    85-90% stenosis mid basilar art; occluded left vert artery; occluded left comm carotid artery w/ collat flow  . Basilar artery angioplasty 09/04/08    sm right brachial hematoma  . Cath single vess dz 06/13/09    70% stenosis RCA EF 65-70%  . Coronary angioplasty with stent placement 06/19/09  . Cataract extraction w/ intraocular lens implant 2007-2008  . Eye surgery     Current Outpatient Prescriptions  Medication Sig Dispense Refill  . amLODipine (NORVASC) 5 MG  tablet Take 1 tablet (5 mg total) by mouth daily.  90 tablet  3  . bisacodyl (DULCOLAX) 5 MG EC tablet Take 5 mg by mouth daily as needed. For constipation      . clopidogrel (PLAVIX) 75 MG tablet Take 1 tablet (75 mg total) by mouth daily.  30 tablet  11  . co-enzyme Q-10 30 MG capsule Take 30 mg by mouth 2 (two) times daily.       . fish oil-omega-3 fatty acids 1000 MG capsule Take 1 g by mouth 2 (two) times daily.      . isosorbide mononitrate (IMDUR) 30 MG 24 hr tablet Take 1 tablet (30 mg total) by mouth daily.  30 tablet  6  . levothyroxine (SYNTHROID, LEVOTHROID) 100 MCG tablet TAKE 1 TABLET DAILY AS DIRECTED  90 tablet  2  . losartan-hydrochlorothiazide (HYZAAR) 100-25 MG per tablet Take 1  tablet by mouth daily.      . nitroGLYCERIN (NITROSTAT) 0.4 MG SL tablet Place 0.4 mg under the tongue every 5 (five) minutes as needed. For chest pain      . pantoprazole (PROTONIX) 40 MG tablet Take 1 tablet (40 mg total) by mouth daily.  30 tablet  11  . simvastatin (ZOCOR) 10 MG tablet Take 10 mg by mouth at bedtime.       Marland Kitchen tetrahydrozoline 0.05 % ophthalmic solution Place 1 drop into both eyes as needed. For dry eyes        Allergies  Allergen Reactions  . Anesthetics, Amide     REACTION:FEVER, SYNCOPE  . Codeine Nausea And Vomiting    Reaction:Hallucinations  . Nitrofurantoin Nausea Only    REACTION: Hallucinations  . Orphenadrine Citrate Nausea And Vomiting    REACTION: Hallucination  . Pentosan Polysulfate Sodium     REACTION: rash/ severe headache  . Statins     Myalgias; taking fish oitl and co q 10 to make statins more tolerable    History   Social History  . Marital Status: Married    Spouse Name: N/A    Number of Children: 1  . Years of Education: N/A   Occupational History  . Bookkeeper-part time    Social History Main Topics  . Smoking status: Never Smoker   . Smokeless tobacco: Never Used  . Alcohol Use: No  . Drug Use: No  . Sexually Active: No   Other Topics Concern  . Not on file   Social History Narrative   Retired 2007 ([place] [employer]    Family History  Problem Relation Age of Onset  . Lung cancer Father   . Heart attack Mother   . Lymphoma Mother     s/p chemo  . Stroke Mother     x 2  . Diabetes Brother     ROS- All systems are reviewed and negative except as per the HPI above  Physical Exam: Filed Vitals:   10/05/11 1146  BP: 142/66  Pulse: 87  Height: 5\' 2"  (1.575 m)  Weight: 209 lb (94.802 kg)  SpO2: 97%    GEN- The patient is overweight appearing, alert and oriented x 3 today.   Head- normocephalic, atraumatic Eyes-  Sclera clear, conjunctiva pink Ears-  hearing intact Oropharynx- clear Lungs- Clear to ausculation bilaterally, normal work of breathing Heart- Regular rate and rhythm, no murmurs, rubs or gallops, PMI not laterally displaced GI- soft, NT, ND, + BS Extremities- no clubbing, cyanosis, or edema MS- age appropriate  muscle atrophy Skin- no rash or lesion Psych- euthymic mood, full affect Neuro- strength and sensation are intact  EKGs, event monitor, and treadmill reviewed as above Cath 8/13 reviewed Echo 12/21/10 reviewed, EF is preserved  Assessment and Plan:

## 2011-10-05 NOTE — Patient Instructions (Addendum)
Your physician recommends that you schedule a follow-up appointment in: 6 weeks with Dr Allred  

## 2011-10-07 ENCOUNTER — Other Ambulatory Visit: Payer: Self-pay | Admitting: Family Medicine

## 2011-10-12 ENCOUNTER — Institutional Professional Consult (permissible substitution): Payer: Medicare HMO | Admitting: Internal Medicine

## 2011-10-15 ENCOUNTER — Encounter: Payer: Self-pay | Admitting: Family Medicine

## 2011-10-15 ENCOUNTER — Ambulatory Visit: Payer: Medicare HMO | Admitting: Family Medicine

## 2011-10-15 ENCOUNTER — Ambulatory Visit (INDEPENDENT_AMBULATORY_CARE_PROVIDER_SITE_OTHER): Payer: Medicare HMO | Admitting: Family Medicine

## 2011-10-15 VITALS — BP 138/76 | HR 75 | Temp 98.2°F | Wt 208.0 lb

## 2011-10-15 DIAGNOSIS — I441 Atrioventricular block, second degree: Secondary | ICD-10-CM

## 2011-10-15 DIAGNOSIS — I10 Essential (primary) hypertension: Secondary | ICD-10-CM

## 2011-10-15 DIAGNOSIS — E7800 Pure hypercholesterolemia, unspecified: Secondary | ICD-10-CM

## 2011-10-15 NOTE — Patient Instructions (Addendum)
Take protonix every other day for about 1 week and then stop.   Don't change your other meds.  Recheck cholesterol at a fasting lab visit in 12/13.  Recheck in 4 months with Para March.  I'll await the cardiology reports.   Take care.

## 2011-10-16 ENCOUNTER — Other Ambulatory Visit (HOSPITAL_COMMUNITY): Payer: Self-pay | Admitting: Interventional Radiology

## 2011-10-16 DIAGNOSIS — I771 Stricture of artery: Secondary | ICD-10-CM

## 2011-10-16 NOTE — Assessment & Plan Note (Signed)
Per cards, continue exercise in meantime. Off BB and BP controlled.

## 2011-10-16 NOTE — Progress Notes (Signed)
Admitted, cathed, then with event monitor and treadmill testing with cards f/u planned for bundle block. No CP now, walking up to 1 mile a day.  occ palpitations, mild. Not SOB.  No BLE edema.  She is intentionally losing weight.    She has f/u with Dr. Corliss Skains planned for 12/13 re: posterior circulation and needs recheck of lipids done around that time. Prev labs d/w pt.   She is worried about her husband, who is chronically ill.  Meds, vitals, and allergies reviewed.   ROS: See HPI.  Otherwise, noncontributory.  nad ncat Mmm rrr ctab abd soft, obese Ext w/o edema

## 2011-10-16 NOTE — Assessment & Plan Note (Signed)
Continue meds and weight loss, recheck lipids 12/13.

## 2011-10-30 ENCOUNTER — Encounter: Payer: Self-pay | Admitting: Internal Medicine

## 2011-10-30 ENCOUNTER — Ambulatory Visit (INDEPENDENT_AMBULATORY_CARE_PROVIDER_SITE_OTHER): Payer: Medicare HMO | Admitting: Internal Medicine

## 2011-10-30 VITALS — BP 146/78 | HR 71 | Ht 61.0 in | Wt 208.0 lb

## 2011-10-30 DIAGNOSIS — I441 Atrioventricular block, second degree: Secondary | ICD-10-CM

## 2011-10-30 DIAGNOSIS — I2510 Atherosclerotic heart disease of native coronary artery without angina pectoris: Secondary | ICD-10-CM

## 2011-10-30 DIAGNOSIS — I10 Essential (primary) hypertension: Secondary | ICD-10-CM

## 2011-10-30 NOTE — Assessment & Plan Note (Signed)
No ischemic symptoms Cath reviewed

## 2011-10-30 NOTE — Assessment & Plan Note (Signed)
Stable No change required today  

## 2011-10-30 NOTE — Progress Notes (Signed)
Primary Care Physician: Graham [name], MD Referring Physician:  Dr McAlhany   Kelsey Dunn is a 70 y.o. female with a h/o obesity, hypertension, preserved EF, and AV conduction disease who presents for EP follow-up.  She reports initially developing exertional shortness of breath and palpitations 08/10/11.  She presented to  hospital and was observed to have second degree AV block demonstrated with RBBB.  Notes during that hospital visit support multiple consecutive P waves not conducted however I do not have strips to document this for review.  She had cath which revealed no lesions requiring revascularization.  Her coreg was stopped.  She was discharged and wore an event monitor.  This did not document arrhythmias or advanced AV block.  There were no episodes of AV block documented.  She was further evaluated by Dr McAlhany and a GXT was performed.  She was observed during the study to have a LBBB with exertion.  Second degree AV block was observed but with no consecutive P waves not conducted.  She developed exertional symptoms of fatigue, SOB, and CP which corresponded to her LBBB.  Upon last being seen by me, she wished to continue a watchful waiting strategy. She continues to have epsiodes of palpitations and dizziness with moderate activity.   She has never had presyncope at rest or exertional syncope.    Past Medical History  Diagnosis Date  . Hypothyroidism 1990  . Labile hypertension 08/1991  . HLD (hyperlipidemia) 01/1997  . Fibromyalgia   . Bladder spasms   . Basilar artery stenosis     90%  . CAD (coronary artery disease)     a) 06/2009 cath: severe stenosis RCA with placement drug eluting stent  b) 08/2011 cath: normal left main, luminal LAD irregularities, luminal LCx irregularities, luminal RCA irregularities and no ISR to mid-RCA DES, LVEF 60-65%  . IC (interstitial cystitis)   . Obesity   . Hypertension   . Blood transfusion   . Pneumonia   . CVA (cerebral  infarction) December 2012   Past Surgical History  Procedure Date  . Bladder suspension 1974  . Total abdominal hysterectomy 1974    dysmennorhea  . Cholecystectomy 1985  . Cardiac catheterization 1993    normal  . Renal angioplasty 1993    normal  . Carotid us 11/93    Occl L comm carotid  . Visual evoked response 11/93; 1/01    normal  . Us renal/aorta 12/93    Normal  . Emg 1/01    LE, RUE normal  . Carotid us 4/98    L comm occl R-ok  . Carotid us 01/30/03    L occl  R less 40%  . Dexa 10/15/04    Troch -0.4 o/w pos  . Carotid arteriogram     bilat  . Bilateral vert & subclavian angiogram 07/19/08    85-90% stenosis mid basilar art; occluded left vert artery; occluded left comm carotid artery w/ collat flow  . Basilar artery angioplasty 09/04/08    sm right brachial hematoma  . Cath single vess dz 06/13/09    70% stenosis RCA EF 65-70%  . Coronary angioplasty with stent placement 06/19/09  . Cataract extraction w/ intraocular lens implant 2007-2008  . Eye surgery     Current Outpatient Prescriptions  Medication Sig Dispense Refill  . amLODipine (NORVASC) 5 MG tablet Take 1 tablet (5 mg total) by mouth daily.  90 tablet  3  . clopidogrel (PLAVIX) 75 MG tablet Take 1   tablet (75 mg total) by mouth daily.  30 tablet  11  . Coenzyme Q10 200 MG capsule Take 200 mg by mouth 2 (two) times daily.      . fish oil-omega-3 fatty acids 1000 MG capsule Take 1 g by mouth 2 (two) times daily.      . levothyroxine (SYNTHROID, LEVOTHROID) 100 MCG tablet TAKE 1 TABLET DAILY AS DIRECTED  90 tablet  2  . losartan-hydrochlorothiazide (HYZAAR) 100-25 MG per tablet Take 1 tablet by mouth daily.      . nitroGLYCERIN (NITROSTAT) 0.4 MG SL tablet Place 0.4 mg under the tongue every 5 (five) minutes as needed. For chest pain      . pantoprazole (PROTONIX) 40 MG tablet Take 1 tablet by mouth every other day.       . simvastatin (ZOCOR) 10 MG tablet Take 10 mg by mouth at bedtime.          Allergies  Allergen Reactions  . Anesthetics, Amide     REACTION:FEVER, SYNCOPE  . Codeine Nausea And Vomiting    Reaction:Hallucinations  . Nitrofurantoin Nausea Only    REACTION: Hallucinations  . Orphenadrine Citrate Nausea And Vomiting    REACTION: Hallucination  . Pentosan Polysulfate Sodium     REACTION: rash/ severe headache  . Statins     Myalgias; taking fish oil and co q 10 to make statins more tolerable    History   Social History  . Marital Status: Married    Spouse Name: N/A    Number of Children: 1  . Years of Education: N/A   Occupational History  . Bookkeeper-part time    Social History Main Topics  . Smoking status: Never Smoker   . Smokeless tobacco: Never Used  . Alcohol Use: No  . Drug Use: No  . Sexually Active: No   Other Topics Concern  . Not on file   Social History Narrative   Retired 2007 ([place] [employer]    Family History  Problem Relation Age of Onset  . Lung cancer Father   . Heart attack Mother   . Lymphoma Mother     s/p chemo  . Stroke Mother     x 2  . Diabetes Brother     ROS- All systems are reviewed and negative except as per the HPI above  Physical Exam: Filed Vitals:   10/30/11 1614  BP: 146/78  Pulse: 71  Height: 5' 1" (1.549 m)  Weight: 208 lb (94.348 kg)  SpO2: 94%    GEN- The patient is overweight appearing, alert and oriented x 3 today.   Head- normocephalic, atraumatic Eyes-  Sclera clear, conjunctiva pink Ears- hearing intact Oropharynx- clear Lungs- Clear to ausculation bilaterally, normal work of breathing Heart- Regular rate and rhythm, no murmurs, rubs or gallops, PMI not laterally displaced GI- soft, NT, ND, + BS Extremities- no clubbing, cyanosis, or edema MS- age appropriate muscle atrophy Skin- no rash or lesion Psych- euthymic mood, full affect Neuro- strength and sensation are intact  EKGs, event monitor, and prior  treadmill reviewed as above Cath 8/13 reviewed Echo 12/21/10 reviewed, EF is preserved  Today I walked the patient on the treadmill (modified Bruce).  She was observed to have poor exercise tolerance with abrupt increase in heart rate with exercise.  She had mobitz II second degree AV block demonstrated.  This worsened with increased exertion and she became dizzy and very short of breath.  The study was   therefore terminated.  Assessment and Plan:  

## 2011-10-30 NOTE — Assessment & Plan Note (Signed)
Today, after holding beta blockers for over a month, she continues to have symptomatic mobitz II second degree AV block demonstrated with exertion.  I finds this worrisome.  I have reviewed with Dr Graciela Husbands who agrees that she has intrinsic AV conduction disease. I would therefore recommend pacemaker implantation at this time.  Risks, benefits, alternatives to pacemaker implantation were discussed in detail with the patient today. The patient understands that the risks include but are not limited to bleeding, infection, pneumothorax, perforation, tamponade, vascular damage, renal failure, MI, stroke, death,  and lead dislodgement and wishes to proceed. We will therefore schedule the procedure at the next available time.  She states that she requires MRIs from time to time.  I therefore anticipate that we will place an MRI compatible device at time of implantation.

## 2011-11-02 ENCOUNTER — Telehealth (HOSPITAL_COMMUNITY): Payer: Self-pay

## 2011-11-02 ENCOUNTER — Telehealth: Payer: Self-pay | Admitting: Internal Medicine

## 2011-11-02 NOTE — Telephone Encounter (Signed)
Spoke with patient and let her know we would try and schedule her for 11/11/11

## 2011-11-02 NOTE — Telephone Encounter (Signed)
Pt would like you to call back asap so she can tell you what she found out

## 2011-11-02 NOTE — Telephone Encounter (Signed)
Dr. Corliss Skains has advised that the pt have her MRI this week so that Dr. Johney Frame can move forward with the pacemaker.  Dr. Corliss Skains also advised that he can do CT angio from this point forward if Dr. Johney Frame feels that the pt needs the pacemaker that is not MRI comp.  I spoke with Kelsey Dunn.  She had a good understanding.

## 2011-11-04 ENCOUNTER — Ambulatory Visit (HOSPITAL_COMMUNITY)
Admission: RE | Admit: 2011-11-04 | Discharge: 2011-11-04 | Disposition: A | Discharge status: Home / Self Care | Payer: Medicare HMO | Source: Ambulatory Visit | Attending: Interventional Radiology | Admitting: Interventional Radiology

## 2011-11-04 DIAGNOSIS — I651 Occlusion and stenosis of basilar artery: Secondary | ICD-10-CM | POA: Insufficient documentation

## 2011-11-04 DIAGNOSIS — I771 Stricture of artery: Secondary | ICD-10-CM

## 2011-11-04 MED ORDER — GADOBENATE DIMEGLUMINE 529 MG/ML IV SOLN
20.0000 mL | Freq: Once | INTRAVENOUS | Status: AC
  Administered 2011-11-04: 20 mL via INTRAVENOUS

## 2011-11-05 ENCOUNTER — Other Ambulatory Visit (INDEPENDENT_AMBULATORY_CARE_PROVIDER_SITE_OTHER): Payer: Medicare HMO

## 2011-11-05 ENCOUNTER — Encounter (HOSPITAL_COMMUNITY): Payer: Self-pay | Admitting: Pharmacy Technician

## 2011-11-05 ENCOUNTER — Other Ambulatory Visit: Payer: Self-pay | Admitting: *Deleted

## 2011-11-05 ENCOUNTER — Encounter: Payer: Self-pay | Admitting: *Deleted

## 2011-11-05 DIAGNOSIS — Z01812 Encounter for preprocedural laboratory examination: Secondary | ICD-10-CM

## 2011-11-05 DIAGNOSIS — I442 Atrioventricular block, complete: Secondary | ICD-10-CM

## 2011-11-05 LAB — CBC WITH DIFFERENTIAL/PLATELET
Eosinophils Absolute: 0.2 10*3/uL (ref 0.0–0.7)
MCHC: 33 g/dL (ref 30.0–36.0)
MCV: 89.8 fl (ref 78.0–100.0)
Monocytes Absolute: 0.6 10*3/uL (ref 0.1–1.0)
Neutrophils Relative %: 79.2 % — ABNORMAL HIGH (ref 43.0–77.0)
Platelets: 254 10*3/uL (ref 150.0–400.0)

## 2011-11-05 LAB — BASIC METABOLIC PANEL
BUN: 14 mg/dL (ref 6–23)
CO2: 32 mEq/L (ref 19–32)
Chloride: 99 mEq/L (ref 96–112)
Creatinine, Ser: 1 mg/dL (ref 0.4–1.2)

## 2011-11-05 NOTE — Telephone Encounter (Signed)
See instruction sheet for 11/11/11

## 2011-11-06 ENCOUNTER — Telehealth: Payer: Self-pay | Admitting: *Deleted

## 2011-11-06 DIAGNOSIS — I441 Atrioventricular block, second degree: Secondary | ICD-10-CM

## 2011-11-06 NOTE — Telephone Encounter (Signed)
Spoke with pt and reviewed lab results. She is not having any burning upon urination but states has had urinary tract infections in past without symptoms.  Per Dr. Tenny Craw' instructions pt will come in Monday AM for UA, BMP and CBC

## 2011-11-09 ENCOUNTER — Other Ambulatory Visit (INDEPENDENT_AMBULATORY_CARE_PROVIDER_SITE_OTHER): Payer: Medicare HMO

## 2011-11-09 DIAGNOSIS — I441 Atrioventricular block, second degree: Secondary | ICD-10-CM

## 2011-11-09 LAB — URINALYSIS, ROUTINE W REFLEX MICROSCOPIC
Bilirubin Urine: NEGATIVE
Nitrite: NEGATIVE
Specific Gravity, Urine: 1.01 (ref 1.000–1.030)
Total Protein, Urine: NEGATIVE
pH: 6.5 (ref 5.0–8.0)

## 2011-11-09 LAB — CBC WITH DIFFERENTIAL/PLATELET
Eosinophils Relative: 5.2 % — ABNORMAL HIGH (ref 0.0–5.0)
HCT: 44.4 % (ref 36.0–46.0)
Hemoglobin: 14.8 g/dL (ref 12.0–15.0)
Lymphocytes Relative: 29.3 % (ref 12.0–46.0)
Lymphs Abs: 1.6 10*3/uL (ref 0.7–4.0)
Monocytes Relative: 8.8 % (ref 3.0–12.0)
Platelets: 222 10*3/uL (ref 150.0–400.0)
WBC: 5.5 10*3/uL (ref 4.5–10.5)

## 2011-11-09 LAB — BASIC METABOLIC PANEL
BUN: 16 mg/dL (ref 6–23)
GFR: 67.71 mL/min (ref >60.00)
Potassium: 3.7 mEq/L (ref 3.5–5.1)
Sodium: 139 mEq/L (ref 135–145)

## 2011-11-10 MED ORDER — CEFAZOLIN SODIUM-DEXTROSE 2-3 GM-% IV SOLR
2.0000 g | INTRAVENOUS | Status: DC
  Filled 2011-11-10 (×2): qty 50

## 2011-11-10 MED ORDER — SODIUM CHLORIDE 0.9 % IR SOLN
80.0000 mg | Status: DC
  Filled 2011-11-10: qty 2

## 2011-11-11 ENCOUNTER — Ambulatory Visit (HOSPITAL_COMMUNITY)
Admission: RE | Admit: 2011-11-11 | Discharge: 2011-11-12 | Disposition: A | Discharge status: Home / Self Care | Payer: Medicare HMO | Source: Ambulatory Visit | Attending: Internal Medicine | Admitting: Internal Medicine

## 2011-11-11 ENCOUNTER — Encounter (HOSPITAL_COMMUNITY): Payer: Self-pay | Admitting: *Deleted

## 2011-11-11 ENCOUNTER — Encounter (HOSPITAL_COMMUNITY)
Admission: RE | Disposition: A | Discharge status: Home / Self Care | Payer: Self-pay | Source: Ambulatory Visit | Attending: Internal Medicine

## 2011-11-11 DIAGNOSIS — I200 Unstable angina: Secondary | ICD-10-CM

## 2011-11-11 DIAGNOSIS — R269 Unspecified abnormalities of gait and mobility: Secondary | ICD-10-CM

## 2011-11-11 DIAGNOSIS — E669 Obesity, unspecified: Secondary | ICD-10-CM | POA: Insufficient documentation

## 2011-11-11 DIAGNOSIS — G43009 Migraine without aura, not intractable, without status migrainosus: Secondary | ICD-10-CM

## 2011-11-11 DIAGNOSIS — R300 Dysuria: Secondary | ICD-10-CM

## 2011-11-11 DIAGNOSIS — E559 Vitamin D deficiency, unspecified: Secondary | ICD-10-CM

## 2011-11-11 DIAGNOSIS — R7309 Other abnormal glucose: Secondary | ICD-10-CM

## 2011-11-11 DIAGNOSIS — I739 Peripheral vascular disease, unspecified: Secondary | ICD-10-CM

## 2011-11-11 DIAGNOSIS — I2510 Atherosclerotic heart disease of native coronary artery without angina pectoris: Secondary | ICD-10-CM

## 2011-11-11 DIAGNOSIS — R0602 Shortness of breath: Secondary | ICD-10-CM

## 2011-11-11 DIAGNOSIS — N951 Menopausal and female climacteric states: Secondary | ICD-10-CM

## 2011-11-11 DIAGNOSIS — H3320 Serous retinal detachment, unspecified eye: Secondary | ICD-10-CM

## 2011-11-11 DIAGNOSIS — E7800 Pure hypercholesterolemia, unspecified: Secondary | ICD-10-CM

## 2011-11-11 DIAGNOSIS — I651 Occlusion and stenosis of basilar artery: Secondary | ICD-10-CM

## 2011-11-11 DIAGNOSIS — Z950 Presence of cardiac pacemaker: Secondary | ICD-10-CM

## 2011-11-11 DIAGNOSIS — E039 Hypothyroidism, unspecified: Secondary | ICD-10-CM

## 2011-11-11 DIAGNOSIS — IMO0001 Reserved for inherently not codable concepts without codable children: Secondary | ICD-10-CM

## 2011-11-11 DIAGNOSIS — M79609 Pain in unspecified limb: Secondary | ICD-10-CM

## 2011-11-11 DIAGNOSIS — N390 Urinary tract infection, site not specified: Secondary | ICD-10-CM

## 2011-11-11 DIAGNOSIS — I441 Atrioventricular block, second degree: Secondary | ICD-10-CM

## 2011-11-11 DIAGNOSIS — I10 Essential (primary) hypertension: Secondary | ICD-10-CM

## 2011-11-11 DIAGNOSIS — I442 Atrioventricular block, complete: Secondary | ICD-10-CM

## 2011-11-11 DIAGNOSIS — R42 Dizziness and giddiness: Secondary | ICD-10-CM

## 2011-11-11 DIAGNOSIS — R072 Precordial pain: Secondary | ICD-10-CM

## 2011-11-11 DIAGNOSIS — E785 Hyperlipidemia, unspecified: Secondary | ICD-10-CM | POA: Insufficient documentation

## 2011-11-11 HISTORY — PX: PERMANENT PACEMAKER INSERTION: SHX5480

## 2011-11-11 HISTORY — DX: Presence of cardiac pacemaker: Z95.0

## 2011-11-11 HISTORY — PX: INSERT / REPLACE / REMOVE PACEMAKER: SUR710

## 2011-11-11 LAB — SURGICAL PCR SCREEN
MRSA, PCR: NEGATIVE
Staphylococcus aureus: NEGATIVE

## 2011-11-11 SURGERY — PERMANENT PACEMAKER INSERTION
Anesthesia: LOCAL

## 2011-11-11 MED ORDER — PANTOPRAZOLE SODIUM 40 MG PO TBEC
40.0000 mg | DELAYED_RELEASE_TABLET | Freq: Every day | ORAL | Status: DC
  Administered 2011-11-12: 40 mg via ORAL
  Filled 2011-11-11: qty 1

## 2011-11-11 MED ORDER — LEVOTHYROXINE SODIUM 100 MCG PO TABS
100.0000 ug | ORAL_TABLET | Freq: Every day | ORAL | Status: DC
  Filled 2011-11-11: qty 1

## 2011-11-11 MED ORDER — CEFAZOLIN SODIUM 1-5 GM-% IV SOLN
1.0000 g | Freq: Four times a day (QID) | INTRAVENOUS | Status: DC
  Administered 2011-11-11: 1 g via INTRAVENOUS
  Filled 2011-11-11 (×2): qty 50

## 2011-11-11 MED ORDER — ONDANSETRON HCL 4 MG/2ML IJ SOLN
4.0000 mg | Freq: Four times a day (QID) | INTRAMUSCULAR | Status: DC | PRN
  Administered 2011-11-11: 4 mg via INTRAVENOUS
  Filled 2011-11-11: qty 2

## 2011-11-11 MED ORDER — CLOPIDOGREL BISULFATE 75 MG PO TABS: 75.0000 mg | ORAL_TABLET | Freq: Every day | ORAL | Status: DC

## 2011-11-11 MED ORDER — SODIUM CHLORIDE 0.9 % IJ SOLN: 3.0000 mL | INTRAMUSCULAR | Status: DC | PRN

## 2011-11-11 MED ORDER — CEFAZOLIN SODIUM 1-5 GM-% IV SOLN
1.0000 g | Freq: Four times a day (QID) | INTRAVENOUS | Status: AC
  Administered 2011-11-11 (×2): 1 g via INTRAVENOUS
  Filled 2011-11-11 (×2): qty 50

## 2011-11-11 MED ORDER — HEPARIN (PORCINE) IN NACL 2-0.9 UNIT/ML-% IJ SOLN
INTRAMUSCULAR | Status: AC
  Filled 2011-11-11: qty 500

## 2011-11-11 MED ORDER — SIMVASTATIN 10 MG PO TABS
10.0000 mg | ORAL_TABLET | Freq: Every day | ORAL | Status: DC
  Administered 2011-11-11: 10 mg via ORAL
  Filled 2011-11-11 (×2): qty 1

## 2011-11-11 MED ORDER — AMLODIPINE BESYLATE 5 MG PO TABS
5.0000 mg | ORAL_TABLET | Freq: Every day | ORAL | Status: DC
  Filled 2011-11-11: qty 1

## 2011-11-11 MED ORDER — LIDOCAINE HCL (PF) 1 % IJ SOLN
INTRAMUSCULAR | Status: AC
  Filled 2011-11-11: qty 30

## 2011-11-11 MED ORDER — HYDROCHLOROTHIAZIDE 25 MG PO TABS
25.0000 mg | ORAL_TABLET | Freq: Every day | ORAL | Status: DC
  Administered 2011-11-12: 25 mg via ORAL
  Filled 2011-11-11: qty 1

## 2011-11-11 MED ORDER — LEVOTHYROXINE SODIUM 100 MCG PO TABS
100.0000 ug | ORAL_TABLET | Freq: Every day | ORAL | Status: DC
  Administered 2011-11-12: 100 ug via ORAL
  Filled 2011-11-11 (×2): qty 1

## 2011-11-11 MED ORDER — SODIUM CHLORIDE 0.9 % IV SOLN: 250.0000 mL | INTRAVENOUS | Status: DC | PRN

## 2011-11-11 MED ORDER — MUPIROCIN 2 % EX OINT
TOPICAL_OINTMENT | Freq: Two times a day (BID) | CUTANEOUS | Status: DC
  Filled 2011-11-11: qty 22

## 2011-11-11 MED ORDER — PANTOPRAZOLE SODIUM 40 MG PO TBEC: 40.0000 mg | DELAYED_RELEASE_TABLET | Freq: Every day | ORAL | Status: DC

## 2011-11-11 MED ORDER — LIDOCAINE HCL (PF) 1 % IJ SOLN
INTRAMUSCULAR | Status: AC
  Filled 2011-11-11: qty 60

## 2011-11-11 MED ORDER — SODIUM CHLORIDE 0.9 % IJ SOLN: 3.0000 mL | Freq: Two times a day (BID) | INTRAMUSCULAR | Status: DC

## 2011-11-11 MED ORDER — FENTANYL CITRATE 0.05 MG/ML IJ SOLN
INTRAMUSCULAR | Status: AC
  Filled 2011-11-11: qty 2

## 2011-11-11 MED ORDER — LOSARTAN POTASSIUM-HCTZ 100-25 MG PO TABS: 1.0000 | ORAL_TABLET | Freq: Every day | ORAL | Status: DC

## 2011-11-11 MED ORDER — MUPIROCIN 2 % EX OINT
TOPICAL_OINTMENT | CUTANEOUS | Status: AC
  Administered 2011-11-11: 09:00:00 via NASAL
  Filled 2011-11-11: qty 22

## 2011-11-11 MED ORDER — CHLORHEXIDINE GLUCONATE 4 % EX LIQD
60.0000 mL | Freq: Once | CUTANEOUS | Status: DC
  Filled 2011-11-11: qty 60

## 2011-11-11 MED ORDER — CLOPIDOGREL BISULFATE 75 MG PO TABS
75.0000 mg | ORAL_TABLET | Freq: Every day | ORAL | Status: DC
  Administered 2011-11-12: 75 mg via ORAL
  Filled 2011-11-11 (×2): qty 1

## 2011-11-11 MED ORDER — SODIUM CHLORIDE 0.45 % IV SOLN
INTRAVENOUS | Status: DC
  Administered 2011-11-11: 09:00:00 via INTRAVENOUS

## 2011-11-11 MED ORDER — MIDAZOLAM HCL 5 MG/5ML IJ SOLN
INTRAMUSCULAR | Status: AC
  Filled 2011-11-11: qty 5

## 2011-11-11 MED ORDER — SODIUM CHLORIDE 0.9 % IV SOLN: 250.0000 mL | INTRAVENOUS | Status: DC

## 2011-11-11 MED ORDER — LOSARTAN POTASSIUM 50 MG PO TABS
100.0000 mg | ORAL_TABLET | Freq: Every day | ORAL | Status: DC
  Administered 2011-11-12: 100 mg via ORAL
  Filled 2011-11-11: qty 2

## 2011-11-11 MED ORDER — ACETAMINOPHEN 325 MG PO TABS
325.0000 mg | ORAL_TABLET | ORAL | Status: DC | PRN
  Administered 2011-11-11 – 2011-11-12 (×3): 650 mg via ORAL
  Filled 2011-11-11 (×3): qty 2

## 2011-11-11 MED ORDER — AMLODIPINE BESYLATE 5 MG PO TABS
5.0000 mg | ORAL_TABLET | Freq: Every day | ORAL | Status: DC
  Administered 2011-11-12: 5 mg via ORAL
  Filled 2011-11-11: qty 1

## 2011-11-11 NOTE — Interval H&P Note (Signed)
History and Physical Interval Note:  11/11/2011 9:19 AM  Kelsey Dunn  has presented today for surgery, with the diagnosis of Heart block  The various methods of treatment have been discussed with the patient and family. After consideration of risks, benefits and other options for treatment, the patient has consented to  Procedure(s) (LRB) with comments: PERMANENT PACEMAKER INSERTION (N/A) as a surgical intervention .  The patient's history has been reviewed, patient examined, no change in status, stable for surgery.  I have reviewed the patient's chart and labs.  Questions were answered to the patient's satisfaction.     Hillis Range  We had a long discussion today about pros and cons of an MRI compatable device.  She is clear that she would favor a traditional pacemaker and does not wish to have a MDT Revo device implanted at this time.

## 2011-11-11 NOTE — Op Note (Signed)
SURGEON:  Hillis Range, MD     PREPROCEDURE DIAGNOSIS:  Symptomatic mobitz II second degree AV block    POSTPROCEDURE DIAGNOSIS:  Symptomatic mobitz II second degree AV block     PROCEDURES:   1. Pacemaker implantation.     INTRODUCTION: Kelsey Dunn is a 71 y.o. female  with a history of symptomatic mobitz II second degree AV block who presents today for pacemaker implantation.  The patient reports intermittent episodes of dizziness over the past few months when on the treadmill.  Recent GXT performed by me documented mobitz II second degree AV block which reproduced her symptoms.  No reversible causes have been identified.  The patient therefore presents today for pacemaker implantation.     DESCRIPTION OF PROCEDURE:  Informed written consent was obtained, and the patient was brought to the electrophysiology lab in a fasting state.  The patient received IV Versed and Fentanyl as sedation for the procedure today.  The patients left chest was prepped and draped in the usual sterile fashion by the EP lab staff. The skin overlying the left deltopectoral region was infiltrated with lidocaine for local analgesia.  A 4-cm incision was made over the left deltopectoral region.  A left subcutaneous pacemaker pocket was fashioned using a combination of sharp and blunt dissection. Electrocautery was required to assure hemostasis.    RA/RV Lead Placement: The left axillary vein was cannulated. No contrast was required for the procedure today. Through the left axillary vein, a Medtronic model [id number] (serial number [id number]) right atrial lead and a Medtronic model 5092- 58 (serial number [id number]) right ventricular lead were advanced with fluoroscopic visualization into the right atrial appendage and right ventricular apex positions respectively.  Interestingly, she did not well tolerate ventricular pacing at multiple sites and complained of SOB and cough with ventricular pacing in multiple locations.  The  same response was observed with PVCs.  She was therefore felt to not well tolerate ventricular pacing.  Initial atrial lead P- waves measured 2 mV with impedance of 715 ohms and a threshold of 1.3 V at 0.5 msec.  Right ventricular lead R-waves measured 8 mV with an impedance of 665 ohms and a threshold of 0.5 V at 0.5 msec.  Both leads were secured to the pectoralis fascia using #2-0 silk over the suture sleeves.   Device Placement:  The leads were then connected to a Medtronic Adapta L model ADDRL 1 (serial number [id number]) pacemaker.  The pocket was irrigated with copious gentamicin solution.  The pacemaker was then placed into the pocket.  The pocket was then closed in 2 layers with 2.0 Vicryl suture for the subcutaneous and subcuticular layers.  Steri-Strips and a sterile dressing were then applied.  There were no early apparent complications.     CONCLUSIONS:   1. Successful implantation of a Medtronic Adapta L dual-chamber pacemaker for symptomatic mobitz II second degree AV block The patient does not well tolerate ventricular pacing and we will therefore try to minimize ventricular pacing when possible.  2. No early apparent complications.           Hillis Range, MD 11/11/2011 10:23 AM

## 2011-11-11 NOTE — H&P (View-Only) (Signed)
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 female with a h/o obesity, hypertension, preserved EF, and AV conduction disease who presents for EP follow-up.  She reports initially developing exertional shortness of breath and palpitations 08/10/11.  She presented to [facility] and was observed to have second degree AV block demonstrated with RBBB.  Notes during that hospital visit support multiple consecutive P waves not conducted however I do not have strips to document this for review.  She had cath which revealed no lesions requiring revascularization.  Her coreg was stopped.  She was discharged and wore an event monitor.  This did not document arrhythmias or advanced AV block.  There were no episodes of AV block documented.  She was further evaluated by Dr Clifton Telford Archambeau and a GXT was performed.  She was observed during the study to have a LBBB with exertion.  Second degree AV block was observed but with no consecutive P waves not conducted.  She developed exertional symptoms of fatigue, SOB, and CP which corresponded to her LBBB.  Upon last being seen by me, she wished to continue a watchful waiting strategy. She continues to have epsiodes of palpitations and dizziness with moderate activity.   She has never had presyncope at rest or exertional syncope.    Past Medical History  Diagnosis Date  . Hypothyroidism 1990  . Labile hypertension 08/1991  . HLD (hyperlipidemia) 01/1997  . Fibromyalgia   . Bladder spasms   . Basilar artery stenosis     90%  . CAD (coronary artery disease)     a) 06/2009 cath: severe stenosis RCA with placement drug eluting stent  b) 08/2011 cath: normal left main, luminal LAD irregularities, luminal LCx irregularities, luminal RCA irregularities and no ISR to mid-RCA DES, LVEF 60-65%  . IC (interstitial cystitis)   . Obesity   . Hypertension   . Blood transfusion   . Pneumonia   . CVA (cerebral  infarction) December 2012   Past Surgical History  Procedure Date  . Bladder suspension 1974  . Total abdominal hysterectomy 1974    dysmennorhea  . Cholecystectomy 1985  . Cardiac catheterization 1993    normal  . Renal angioplasty 1993    normal  . Carotid US 11/93    Occl L comm carotid  . Visual evoked response 11/93; 1/01    normal  . US renal/aorta 12/93    Normal  . Emg 1/01    LE, RUE normal  . Carotid US 4/98    L comm occl R-ok  . Carotid US 01/30/03    L occl  R less 40%  . Dexa 10/15/04    Troch -0.4 o/w pos  . Carotid arteriogram     bilat  . Bilateral vert & subclavian angiogram 07/19/08    85-90% stenosis mid basilar art; occluded left vert artery; occluded left comm carotid artery w/ collat flow  . Basilar artery angioplasty 09/04/08    sm right brachial hematoma  . Cath single vess dz 06/13/09    70% stenosis RCA EF 65-70%  . Coronary angioplasty with stent placement 06/19/09  . Cataract extraction w/ intraocular lens implant 2007-2008  . Eye surgery     Current Outpatient Prescriptions  Medication Sig Dispense Refill  . amLODipine (NORVASC) 5 MG tablet Take 1 tablet (5 mg total) by mouth daily.  90 tablet  3  . clopidogrel (PLAVIX) 75 MG tablet Take 1  tablet (75 mg total) by mouth daily.  30 tablet  11  . Coenzyme Q10 200 MG capsule Take 200 mg by mouth 2 (two) times daily.      . fish oil-omega-3 fatty acids 1000 MG capsule Take 1 g by mouth 2 (two) times daily.      Marland Kitchen levothyroxine (SYNTHROID, LEVOTHROID) 100 MCG tablet TAKE 1 TABLET DAILY AS DIRECTED  90 tablet  2  . losartan-hydrochlorothiazide (HYZAAR) 100-25 MG per tablet Take 1 tablet by mouth daily.      . nitroGLYCERIN (NITROSTAT) 0.4 MG SL tablet Place 0.4 mg under the tongue every 5 (five) minutes as needed. For chest pain      . pantoprazole (PROTONIX) 40 MG tablet Take 1 tablet by mouth every other day.       . simvastatin (ZOCOR) 10 MG tablet Take 10 mg by mouth at bedtime.          Allergies  Allergen Reactions  . Anesthetics, Amide     REACTION:FEVER, SYNCOPE  . Codeine Nausea And Vomiting    Reaction:Hallucinations  . Nitrofurantoin Nausea Only    REACTION: Hallucinations  . Orphenadrine Citrate Nausea And Vomiting    REACTION: Hallucination  . Pentosan Polysulfate Sodium     REACTION: rash/ severe headache  . Statins     Myalgias; taking fish oil and co q 10 to make statins more tolerable    History   Social History  . Marital Status: Married    Spouse Name: N/A    Number of Children: 1  . Years of Education: N/A   Occupational History  . Bookkeeper-part time    Social History Main Topics  . Smoking status: Never Smoker   . Smokeless tobacco: Never Used  . Alcohol Use: No  . Drug Use: No  . Sexually Active: No   Other Topics Concern  . Not on file   Social History Narrative   Retired 2007 ([place] [employer])[place]    Family History  Problem Relation Age of Onset  . Lung cancer Father   . Heart attack Mother   . Lymphoma Mother     s/p chemo  . Stroke Mother     x 2  . Diabetes Brother     ROS- All systems are reviewed and negative except as per the HPI above  Physical Exam: Filed Vitals:   10/30/11 1614  BP: 146/78  Pulse: 71  Height: 5\' 1"  (1.549 m)  Weight: 208 lb (94.348 kg)  SpO2: 94%    GEN- The patient is overweight appearing, alert and oriented x 3 today.   Head- normocephalic, atraumatic Eyes-  Sclera clear, conjunctiva pink Ears- hearing intact Oropharynx- clear Lungs- Clear to ausculation bilaterally, normal work of breathing Heart- Regular rate and rhythm, no murmurs, rubs or gallops, PMI not laterally displaced GI- soft, NT, ND, + BS Extremities- no clubbing, cyanosis, or edema MS- age appropriate muscle atrophy Skin- no rash or lesion Psych- euthymic mood, full affect Neuro- strength and sensation are intact  EKGs, event monitor, and prior  treadmill reviewed as above Cath 8/13 reviewed Echo 12/21/10 reviewed, EF is preserved  Today I walked the patient on the treadmill (modified Bruce).  She was observed to have poor exercise tolerance with abrupt increase in heart rate with exercise.  She had mobitz II second degree AV block demonstrated.  This worsened with increased exertion and she became dizzy and very short of breath.  The study was  therefore terminated.  Assessment and Plan:

## 2011-11-12 ENCOUNTER — Ambulatory Visit (HOSPITAL_COMMUNITY): Payer: Medicare HMO

## 2011-11-12 ENCOUNTER — Ambulatory Visit: Payer: Medicare HMO | Admitting: Internal Medicine

## 2011-11-12 DIAGNOSIS — I441 Atrioventricular block, second degree: Secondary | ICD-10-CM

## 2011-11-12 NOTE — Progress Notes (Signed)
   ELECTROPHYSIOLOGY ROUNDING NOTE    Patient Name: Kelsey Dunn Date of Encounter: 11-12-2011    SUBJECTIVE:Patient feels well.  No chest pain or shortness of breath.  Minima incisional soreness.  S/p PPM implant 11-11-2011  TELEMETRY: Reviewed telemetry pt in sinus rhythm with occasional atrial pacing Filed Vitals:   11/11/11 1315 11/11/11 1359 11/11/11 2029 11/12/11 0500  BP: 140/54 147/75 142/77 141/68  Pulse: 71 67 66 65  Temp: 97.6 F (36.4 C) 98.2 F (36.8 C) 99.1 F (37.3 C) 98.4 F (36.9 C)  TempSrc:  Oral Oral Oral  Resp: 18 18 18 19   Height:      Weight:      SpO2:  95% 97% 93%    Intake/Output Summary (Last 24 hours) at 11/12/11 1610 Last data filed at 11/11/11 1745  Gross per 24 hour  Intake    600 ml  Output   1350 ml  Net   -750 ml   Physical Exam:  GEN- The patient is well appearing, alert and oriented x 3 today.   Head- normocephalic, atraumatic Eyes-  Sclera clear, conjunctiva pink Ears- hearing intact Oropharynx- clear Neck- supple, Lungs- Clear to ausculation bilaterally, normal work of breathing Chest- pacemaker pocket is without hematoma Heart- Regular rate and rhythm, no murmurs, rubs or gallops, PMI not laterally displaced GI- soft, NT, ND, + BS Extremities- no clubbing, cyanosis, or edema Neuro- strength and sensation are intact  Pacemaker interrogation- reviewed in detail today,  See paper chart    LABS: Basic Metabolic Panel:  Basename 11/09/11 0814  NA 139  K 3.7  CL 101  CO2 31  GLUCOSE 123*  BUN 16  CREATININE 0.9  CALCIUM 9.3  MG --  PHOS --   CBC:  Basename 11/09/11 0814  WBC 5.5  NEUTROABS 3.1  HGB 14.8  HCT 44.4  MCV 89.1  PLT 222.0     Radiology/Studies:  No ptx, stable lead position   Assessment and Plan:  1, Mobitz II Second Degree AV block Normal pacemaker function Wound care, arm mobility, restrictions reviewed with patient.  Routine follow up scheduled.   DC to home

## 2011-11-12 NOTE — Progress Notes (Signed)
Went over all D/C info with pt and husband including home meds, follow up appts, arm restrictions for post pacer placement and when to call MD. D/C per w/c with all personal belongings Left per private vehicle with husband. Marisa Cyphers RN

## 2011-11-12 NOTE — Consult Note (Addendum)
ELECTROPHYSIOLOGY PROCEDURE DISCHARGE SUMMARY    Patient ID: Kelsey Dunn,  MRN: [id number], DOB/AGE: 02-17-1940 71 y.o.  Admit date: 11/11/2011 Discharge date: 11/12/2011  Primary Care Physician: Crawford Givens, MD Primary Cardiologist: Doylene Bode, MD  Primary Discharge Diagnosis:  Mobitz II AV block status post pacemaker implantation this admission  Secondary Discharge Diagnosis:  1.  Hypothyroidism 2.  Labile hypertension 3.  Hyperlipidemia 4.  Fibromyalgia 5.  Coronary artery disease status post DES to RCA in 2011, cath 08-2011 with patent stent, EF 60-65% 6.  Obesity  Procedures This Admission:  1.  Implantation of a dual chamber pacemaker on 11-11-2011.  She received a MDT ADDRL1 pacemaker with model number 5076 right atrial lead and model number 5092 right ventricular lead.  There were no early apparent complications. 2.  CXR demonstrated no ptx, stable leads  Brief HPI: Kelsey Dunn is a 71 y.o. female with a h/o obesity, hypertension, preserved EF, and AV conduction disease who presents for EP follow-up. She reports initially developing exertional shortness of breath and palpitations 08/10/11. She presented to [facility] and was observed to have second degree AV block demonstrated with RBBB. Notes during that hospital visit support multiple consecutive P waves not conducted however I do not have strips to document this for review. She had cath which revealed no lesions requiring revascularization. Her coreg was stopped. She was discharged and wore an event monitor. This did not document arrhythmias or advanced AV block. There were no episodes of AV block documented. She was further evaluated by Dr Clifton Alexxander Kurt and a GXT was performed. She was observed during the study to have a LBBB with exertion. Second degree AV block was observed but with no consecutive P waves not conducted. She developed exertional symptoms of fatigue, SOB, and CP which corresponded to her LBBB.  Upon last being seen by me, she wished to continue a watchful waiting strategy. She continues to have epsiodes of palpitations and dizziness with moderate activity. She has never had presyncope at rest or exertional syncope. Risks, benefits, and alternatives to pacemaker implantation were reviewed with the patient who wished to proceed.  Hospital Course:  The patient was admitted and underwent implantation of a Medtronic dual chamber pacemaker with details as outlined above.   He was monitored on telemetry overnight which demonstrated atrial pacing with intrinsic conduction.  Left chest was without hematoma or ecchymosis.  The device was interrogated and found to be functioning normally.  CXR was obtained and demonstrated no pneumothorax status post device implantation.  Wound care, arm mobility, and restrictions were reviewed with the patient.  Dr Johney Frame examined the patient and considered them stable for discharge to home.    Discharge Vitals: Blood pressure 141/68, pulse 65, temperature 98.4 F (36.9 C), temperature source Oral, resp. rate 19, height 5\' 1"  (1.549 m), weight 206 lb (93.441 kg), SpO2 93.00%.   Labs:   Lab Results  Component Value Date   WBC 5.5 11/09/2011   HGB 14.8 11/09/2011   HCT 44.4 11/09/2011   MCV 89.1 11/09/2011   PLT 222.0 11/09/2011    Lab 11/09/11 0814  NA 139  K 3.7  CL 101  CO2 31  BUN 16  CREATININE 0.9  CALCIUM 9.3  PROT --  BILITOT --  ALKPHOS --  ALT --  AST --  GLUCOSE 123*   Discharge Medications:    Medication List     As of 11/12/2011  8:00 AM    ASK your  doctor about these medications         amLODipine 5 MG tablet   Commonly known as: NORVASC   Take 1 tablet (5 mg total) by mouth daily.      clopidogrel 75 MG tablet   Commonly known as: PLAVIX   Take 1 tablet (75 mg total) by mouth daily.      Coenzyme Q10 200 MG capsule   Take 200 mg by mouth 2 (two) times daily.      FISH OIL BURP-LESS 500 MG Caps   Take 500 mg by mouth 2  (two) times daily.      levothyroxine 100 MCG tablet   Commonly known as: SYNTHROID, LEVOTHROID   Take 100 mcg by mouth daily.      losartan-hydrochlorothiazide 100-25 MG per tablet   Commonly known as: HYZAAR   Take 1 tablet by mouth daily.      nitroGLYCERIN 0.4 MG SL tablet   Commonly known as: NITROSTAT   Place 0.4 mg under the tongue every 5 (five) minutes as needed. For chest pain      pantoprazole 40 MG tablet   Commonly known as: PROTONIX   Take 40 mg by mouth daily.      simvastatin 10 MG tablet   Commonly known as: ZOCOR   Take 10 mg by mouth at bedtime.        Disposition:      Discharge Orders    Future Appointments: Provider: Department: Dept Phone: Center:   11/19/2011 2:30 PM [facility] [employer] [facility] Main Office [place]) [phone] [facility]   11/30/2011 10:15 AM Kathleene Hazel, MD [facility] Main Office [place]) [phone] [facility]   12/08/2011 8:45 AM [facility] [place] [phone] [facility]   02/16/2012 11:00 AM Joaquim Nam, MD [facility] [place] [phone] [facility]   02/17/2012 9:00 AM Hillis Range, MD [facility] Main Office [place]) [phone] [facility]       Duration of Discharge Encounter: Greater than 30 minutes including physician time.   Hillis Range, MD

## 2011-11-13 ENCOUNTER — Encounter: Payer: Self-pay | Admitting: *Deleted

## 2011-11-13 DIAGNOSIS — Z950 Presence of cardiac pacemaker: Secondary | ICD-10-CM | POA: Insufficient documentation

## 2011-11-19 ENCOUNTER — Ambulatory Visit (INDEPENDENT_AMBULATORY_CARE_PROVIDER_SITE_OTHER): Payer: Medicare HMO | Admitting: *Deleted

## 2011-11-19 DIAGNOSIS — I441 Atrioventricular block, second degree: Secondary | ICD-10-CM

## 2011-11-19 DIAGNOSIS — I442 Atrioventricular block, complete: Secondary | ICD-10-CM

## 2011-11-19 LAB — PACEMAKER DEVICE OBSERVATION
AL IMPEDENCE PM: 543 Ohm
AL THRESHOLD: 0.5 V
ATRIAL PACING PM: 30
BATTERY VOLTAGE: 2.8 V
RV LEAD IMPEDENCE PM: 702 Ohm
VENTRICULAR PACING PM: 0

## 2011-11-19 NOTE — Progress Notes (Signed)
Wound check-PPM 

## 2011-11-29 ENCOUNTER — Telehealth: Payer: Self-pay | Admitting: Cardiology

## 2011-11-29 NOTE — Telephone Encounter (Signed)
Returned call to Mrs. Gengler. Reviewed chart. She underwent placement of PPM on 11/11/11 for Mobitz II AVB. She has been doing well until three days ago when she experienced sudden onset palpitations. This lasted about 30 minutes and then terminated spontaneously. The palpitations "made her cough" and feel tight in her chest, but otherwise no sob, chest pain, lightheadedness or syncope. The palpitations returned tonight with the same associated symptoms. They again lasted about 30 minutes and have "eased off", but she feels like they are going to happen again. Her BP is 140/100 and HR 100. She does not want to go to the ER tonight. She has an appointment with Dr. Clifton James tomorrow. If she continues to experience symptomatic palpitations tonight she agrees to seek medical attention, otherwise she will keep appt with Dr. Clifton James tomorrow.  [place], PA-C 11/29/2011 6:58 PM

## 2011-11-30 ENCOUNTER — Ambulatory Visit (INDEPENDENT_AMBULATORY_CARE_PROVIDER_SITE_OTHER): Payer: Medicare HMO | Admitting: Cardiovascular Disease

## 2011-11-30 ENCOUNTER — Ambulatory Visit: Payer: Medicare HMO | Admitting: Cardiovascular Disease

## 2011-11-30 ENCOUNTER — Encounter: Payer: Self-pay | Admitting: Cardiovascular Disease

## 2011-11-30 VITALS — BP 124/84 | HR 81 | Ht 61.2 in | Wt 207.0 lb

## 2011-11-30 DIAGNOSIS — I441 Atrioventricular block, second degree: Secondary | ICD-10-CM

## 2011-11-30 DIAGNOSIS — I2510 Atherosclerotic heart disease of native coronary artery without angina pectoris: Secondary | ICD-10-CM

## 2011-11-30 NOTE — Telephone Encounter (Signed)
Reviewed. cdm 

## 2011-11-30 NOTE — Patient Instructions (Addendum)
Your physician wants you to follow-up in:  6 months. You will receive a reminder letter in the mail two months in advance. If you don't receive a letter, please call our office to schedule the follow-up appointment.   

## 2011-11-30 NOTE — Progress Notes (Signed)
History of Present Illness: 71 yo WF with h/o CAD with DES RCA in June 2011, HTN, hyperlipidemia, hypothyroidism,  and basilar artery stenosis here today for follow up. Left heart cath on 06/13/09 and she was found to have a severe stenosis in the RCA. There was minor disease in the LAD and Circumflex. She was brought back in for PCI on 06/19/09 and I placed a single DES in the RCA. She was admitted to [facility] 08/10/11 with dizziness, chest pain and repeat cath showed stable disease. She was observed to have second degree AV block demonstrated with RBBB. Notes during that hospital visit support multiple consecutive P waves not conducted. Her coreg was stopped. She was discharged and wore an event monitor. This did not document arrhythmias or advanced AV block. There were no episodes of AV block documented. GXT was performed in our office 09/01/11. She was observed during the study to have a LBBB with exertion. She developed exertional symptoms of fatigue, SOB, and CP which corresponded to her LBBB. She was seen by Dr. Johney Frame and continued to have episodes of palpitations and dizziness with moderate activity. A permanent pacemaker was placed on 11/11/11 by Dr. Johney Frame. She has been undergoing cerebral angiograms for basilar artery stenosis and has had angiplasty of the basilar artery in August 2010.  She is known to have an occluded left common carotid artery. She was admitted to [facility] December 2012 with a CVA. She is being followed by Neurology.    She is here today for follow up. She reports that she has woken up several times at night over the last week with her heart racing, lasting up to one minute each time. Last night she was sitting in a chair at home and stood up to walk the dog out and felt her heart racing and she began coughing. Her BP was 148/103 with HR of 104. This episode lasted for 45 minutes.   Pacer check this am and reproduction of symptoms witih V-pacing.    Primary Care Physician:  Dr. Para March   Last Lipid Profile:Lipid Panel     Component Value Date/Time   CHOL 127 06/02/2011 1106   TRIG 142 06/02/2011 1106   HDL 38* 06/02/2011 1106   CHOLHDL 3.3 06/02/2011 1106   VLDL 28 06/02/2011 1106   LDLCALC 61 06/02/2011 1106     Past Medical History  Diagnosis Date  . Hypothyroidism 1990  . Labile hypertension 08/1991  . HLD (hyperlipidemia) 01/1997  . Fibromyalgia   . Bladder spasms   . Basilar artery stenosis     90%  . CAD (coronary artery disease)     a) 06/2009 cath: severe stenosis RCA with placement drug eluting stent  b) 08/2011 cath: normal left main, luminal LAD irregularities, luminal LCx irregularities, luminal RCA irregularities and no ISR to mid-RCA DES, LVEF 60-65%  . IC (interstitial cystitis)   . Obesity   . Hypertension   . Blood transfusion   . Pneumonia   . CVA (cerebral infarction) December 2012  . Pacemaker 11/11/2011    dual chamber pacemaker    Past Surgical History  Procedure Date  . Bladder suspension 1974  . Total abdominal hysterectomy 1974    dysmennorhea  . Cholecystectomy 1985  . Cardiac catheterization 1993    normal  . Renal angioplasty 1993    normal  . Carotid US 11/93    Occl L comm carotid  . Visual evoked response 11/93; 1/01    normal  .  US renal/aorta 12/93    Normal  . Emg 1/01    LE, RUE normal  . Carotid US 4/98    L comm occl R-ok  . Carotid US 01/30/03    L occl  R less 40%  . Dexa 10/15/04    Troch -0.4 o/w pos  . Carotid arteriogram     bilat  . Bilateral vert & subclavian angiogram 07/19/08    85-90% stenosis mid basilar art; occluded left vert artery; occluded left comm carotid artery w/ collat flow  . Basilar artery angioplasty 09/04/08    sm right brachial hematoma  . Cath single vess dz 06/13/09    70% stenosis RCA EF 65-70%  . Coronary angioplasty with stent placement 06/19/09  . Cataract extraction w/ intraocular lens implant 2007-2008  . Eye surgery     Current Outpatient Prescriptions    Medication Sig Dispense Refill  . amLODipine (NORVASC) 5 MG tablet Take 1 tablet (5 mg total) by mouth daily.  90 tablet  3  . clopidogrel (PLAVIX) 75 MG tablet Take 1 tablet (75 mg total) by mouth daily.  30 tablet  11  . Coenzyme Q10 200 MG capsule Take 200 mg by mouth 2 (two) times daily.      Marland Kitchen levothyroxine (SYNTHROID, LEVOTHROID) 100 MCG tablet Take 100 mcg by mouth daily.      Marland Kitchen losartan-hydrochlorothiazide (HYZAAR) 100-25 MG per tablet Take 1 tablet by mouth daily.      . nitroGLYCERIN (NITROSTAT) 0.4 MG SL tablet Place 0.4 mg under the tongue every 5 (five) minutes as needed. For chest pain      . Omega-3 Fatty Acids (FISH OIL BURP-LESS) 500 MG CAPS Take 500 mg by mouth 2 (two) times daily.      . pantoprazole (PROTONIX) 40 MG tablet Take 40 mg by mouth daily.       . simvastatin (ZOCOR) 10 MG tablet Take 10 mg by mouth at bedtime.         Allergies  Allergen Reactions  . Anesthetics, Amide     REACTION:FEVER, SYNCOPE  . Codeine Nausea And Vomiting    Reaction:Hallucinations  . Nitrofurantoin Nausea Only    REACTION: Hallucinations  . Orphenadrine Citrate Nausea And Vomiting    REACTION: Hallucination  . Pentosan Polysulfate Sodium     REACTION: rash/ severe headache    History   Social History  . Marital Status: Married    Spouse Name: N/A    Number of Children: 1  . Years of Education: N/A   Occupational History  . Bookkeeper-part time    Social History Main Topics  . Smoking status: Never Smoker   . Smokeless tobacco: Never Used  . Alcohol Use: No  . Drug Use: No  . Sexually Active: No   Other Topics Concern  . Not on file   Social History Narrative   Retired 2007 ([place] [employer] 1 [place]    Family History  Problem Relation Age of Onset  . Lung cancer Father   . Heart attack Mother   . Lymphoma Mother     s/p chemo  . Stroke Mother     x 2  . Diabetes Brother     Review of Systems:  As  stated in the HPI and otherwise negative.   BP 124/84  Pulse 81  Ht 5' 1.2" (1.554 m)  Wt 207 lb (93.895 kg)  BMI 38.86 kg/m2  SpO2 98%  Physical Examination: General: Well developed,  well nourished, NAD HEENT: OP clear, mucus membranes moist SKIN: warm, dry. No rashes. Neuro: No focal deficits Musculoskeletal: Muscle strength 5/5 all ext Psychiatric: Mood and affect normal Neck: No JVD, no carotid bruits, no thyromegaly, no lymphadenopathy. Lungs:Clear bilaterally, no wheezes, rhonci, crackles Cardiovascular: Regular rate and rhythm. No murmurs, gallops or rubs. Abdomen:Soft. Bowel sounds present. Non-tender.  Extremities: No lower extremity edema. Pulses are 2 + in the bilateral DP/PT.   Assessment and Plan:   1. CAD: Stable. No changes.   2. High grade symptomatic AV block: s/p PPM 11/10/11 per Dr. Johney Frame. Her episodes of coughing and "heart racing" are most likely related to her episodes of ventricular pacing as demonstrated this am. She had reproduction of her symptoms with v-pacing. Discussed with Dr. Johney Frame this am. No changes at this time.

## 2011-12-08 ENCOUNTER — Encounter: Payer: Self-pay | Admitting: Internal Medicine

## 2011-12-08 ENCOUNTER — Other Ambulatory Visit (INDEPENDENT_AMBULATORY_CARE_PROVIDER_SITE_OTHER): Payer: Medicare HMO

## 2011-12-08 DIAGNOSIS — I10 Essential (primary) hypertension: Secondary | ICD-10-CM

## 2011-12-08 LAB — LIPID PANEL
Cholesterol: 155 mg/dL (ref 0–200)
LDL Cholesterol: 84 mg/dL (ref 0–99)
Triglycerides: 149 mg/dL (ref 0.0–149.0)
VLDL: 29.8 mg/dL (ref 0.0–40.0)

## 2011-12-09 ENCOUNTER — Encounter: Payer: Self-pay | Admitting: *Deleted

## 2011-12-10 ENCOUNTER — Other Ambulatory Visit (HOSPITAL_COMMUNITY): Payer: Self-pay | Admitting: Physician Assistant

## 2011-12-10 ENCOUNTER — Other Ambulatory Visit: Payer: Self-pay | Admitting: Family Medicine

## 2011-12-10 DIAGNOSIS — R12 Heartburn: Secondary | ICD-10-CM

## 2011-12-10 MED ORDER — PANTOPRAZOLE SODIUM 40 MG PO TBEC: 40.0000 mg | DELAYED_RELEASE_TABLET | Freq: Every day | ORAL | Status: DC

## 2011-12-10 NOTE — Telephone Encounter (Signed)
Pt left v/m requesting refills on Pantoprazole to [facility]; pt said Dr Para March would OK refills for Pantoprazole. 10/15/11 visit pt instructions pt to take Pantoprazole every other day for one week and then stop.Please advise.

## 2011-12-10 NOTE — Telephone Encounter (Signed)
If she is continuing to have heartburn, then please send in rx.  Thanks.

## 2011-12-10 NOTE — Telephone Encounter (Signed)
Rx sent to pharmacy   

## 2011-12-15 ENCOUNTER — Other Ambulatory Visit (HOSPITAL_COMMUNITY): Payer: Medicare HMO

## 2011-12-15 ENCOUNTER — Ambulatory Visit (HOSPITAL_COMMUNITY): Payer: Medicare HMO

## 2011-12-21 ENCOUNTER — Encounter: Payer: Self-pay | Admitting: Family Medicine

## 2011-12-21 ENCOUNTER — Ambulatory Visit (INDEPENDENT_AMBULATORY_CARE_PROVIDER_SITE_OTHER): Payer: Medicare HMO | Admitting: Family Medicine

## 2011-12-21 ENCOUNTER — Ambulatory Visit (INDEPENDENT_AMBULATORY_CARE_PROVIDER_SITE_OTHER): Payer: Medicare HMO | Admitting: *Deleted

## 2011-12-21 VITALS — BP 148/80 | HR 80 | Temp 97.9°F | Wt 207.8 lb

## 2011-12-21 DIAGNOSIS — Y92009 Unspecified place in unspecified non-institutional (private) residence as the place of occurrence of the external cause: Secondary | ICD-10-CM | POA: Insufficient documentation

## 2011-12-21 DIAGNOSIS — W19XXXA Unspecified fall, initial encounter: Secondary | ICD-10-CM

## 2011-12-21 DIAGNOSIS — S0003XA Contusion of scalp, initial encounter: Secondary | ICD-10-CM

## 2011-12-21 DIAGNOSIS — I441 Atrioventricular block, second degree: Secondary | ICD-10-CM

## 2011-12-21 NOTE — Progress Notes (Signed)
Patient called this am stating she fell and hit her head and pacemaker during the night.  Interrogation of the pacemaker is normal.  She does have a small area on the left side of her forehead that is tender to touch.  Per Dr. Johney Frame if she has any nausea, vomiting or dizziness to call her PCP since she is on plavix.  She also has an area on the distal end of the incision where a stitch was removed.  There is some redness noted.  Antibiotic ointment and a bandaid was applied today and she will return Friday for a quick re-check of her wound.

## 2011-12-21 NOTE — Assessment & Plan Note (Signed)
Isolated incident.  D/w pt about home safety.  She may bruise tomorrow and be more sore tomorrow. D/w pt about tylenol, ice and rom for shoulder.  No need to image, no indication for imaging.  F/u prn.  She agrees.

## 2011-12-21 NOTE — Progress Notes (Signed)
Fell about 2AM.  She tripped over the dog.  The dog is doing well currently per patient.  The light wasn't on when she tripped over the dog.  She hit the L side of her head on the door and L arm was outstretched when she fell.  Floor is carpeted.  No LOC, no focal neuro changes.  No syncope, no presyncope.  She talked to cards about her pacer and that was checked this AM.    She is moving an extra nightlight in the room when she gets home.    The HA was better this AM after cold pack and tylenol.   Meds, vitals, and allergies reviewed.   ROS: See HPI.  Otherwise, noncontributory.  nad ncat except for slightly puffy and tender on L parietal area but w/o bruising Tm wnl Nasal and OP exam w/o acute changes Neck supple rrr ctab Normal ROM L shoulder w/o bruising on the arm CN 2-12 wnl B, S/S wnl Speech, coordination wnl

## 2011-12-21 NOTE — Patient Instructions (Addendum)
Keep using the ice bag and tylenol.  You will likely be more sore tomorrow.  Use the arm exercises for your shoulder if needed to keep it from getting stiff.

## 2012-01-07 ENCOUNTER — Other Ambulatory Visit: Payer: Self-pay | Admitting: Family Medicine

## 2012-01-11 ENCOUNTER — Other Ambulatory Visit: Payer: Self-pay | Admitting: Family Medicine

## 2012-01-11 ENCOUNTER — Telehealth (HOSPITAL_COMMUNITY): Payer: Self-pay | Admitting: Interventional Radiology

## 2012-02-07 ENCOUNTER — Other Ambulatory Visit: Payer: Self-pay | Admitting: Family Medicine

## 2012-02-16 ENCOUNTER — Ambulatory Visit (INDEPENDENT_AMBULATORY_CARE_PROVIDER_SITE_OTHER): Payer: Medicare HMO | Admitting: Family Medicine

## 2012-02-16 ENCOUNTER — Encounter: Payer: Self-pay | Admitting: Family Medicine

## 2012-02-16 VITALS — BP 130/72 | HR 54 | Temp 98.4°F | Wt 208.0 lb

## 2012-02-16 DIAGNOSIS — R6889 Other general symptoms and signs: Secondary | ICD-10-CM

## 2012-02-16 DIAGNOSIS — E8881 Metabolic syndrome: Secondary | ICD-10-CM

## 2012-02-16 DIAGNOSIS — E669 Obesity, unspecified: Secondary | ICD-10-CM

## 2012-02-16 NOTE — Patient Instructions (Addendum)
Talk to cardiology tomorrow.  If a cause isn't seen for the exertional symptoms, then notify the clinic here so we can set up pulmonary testing.

## 2012-02-16 NOTE — Progress Notes (Signed)
She gets occ exertional chest pain on the treadmill.  Some days she does well with no pain.  Even on good days, she can't go more than 1 mile at 3.0 pace, flat slope.  On bad days, she has to do 0.5 miles at a time.  The exertional pain improves with rest after about 10 minutes.  She feels like her pulse decreases during exercise, the monitor on the treadmill reflects this.  She usually has no chest pain at rest except for occ nocturnal sx that wake her.  This doesn't feel likely typical GERD sx.  She hasn't taken any NTG yet for any of these episodes.  At the best of conditions, she can't tolerate more than 20 min of walking, even with a slower pace.    Meds, vitals, and allergies reviewed.   ROS: See HPI.  Otherwise, noncontributory.  GEN: nad, alert and oriented. Overweight.   HEENT: mucous membranes moist NECK: supple w/o LA CV: rrr. Pacer pocket appears well healed PULM: ctab, no inc wob ABD: soft, +bs EXT: no edema SKIN: no acute rash

## 2012-02-17 ENCOUNTER — Encounter: Payer: Self-pay | Admitting: Internal Medicine

## 2012-02-17 ENCOUNTER — Ambulatory Visit (INDEPENDENT_AMBULATORY_CARE_PROVIDER_SITE_OTHER): Payer: Medicare HMO | Admitting: Internal Medicine

## 2012-02-17 VITALS — BP 151/71 | HR 63 | Ht 61.0 in | Wt 207.8 lb

## 2012-02-17 DIAGNOSIS — E8881 Metabolic syndrome: Secondary | ICD-10-CM | POA: Insufficient documentation

## 2012-02-17 DIAGNOSIS — R0602 Shortness of breath: Secondary | ICD-10-CM

## 2012-02-17 DIAGNOSIS — I441 Atrioventricular block, second degree: Secondary | ICD-10-CM

## 2012-02-17 DIAGNOSIS — R6889 Other general symptoms and signs: Secondary | ICD-10-CM | POA: Insufficient documentation

## 2012-02-17 DIAGNOSIS — E669 Obesity, unspecified: Secondary | ICD-10-CM | POA: Insufficient documentation

## 2012-02-17 LAB — PACEMAKER DEVICE OBSERVATION
ATRIAL PACING PM: 31
BAMS-0001: 150 {beats}/min
BATTERY VOLTAGE: 2.8 V
RV LEAD IMPEDENCE PM: 671 Ohm
RV LEAD THRESHOLD: 1 V
VENTRICULAR PACING PM: 4

## 2012-02-17 NOTE — Assessment & Plan Note (Signed)
In spite of persistent exercise attempts, she isn't increasing her tolerance on the treadmill.  She has f/u with cards tomorrow. I'll await that visit. We can consider pulmonary testing here in the clinic if she doesn't have intervention by cards.  I didn't change her meds at the visit today.

## 2012-02-17 NOTE — Assessment & Plan Note (Signed)
She is working to lose weight via diet and exercise.

## 2012-02-17 NOTE — Assessment & Plan Note (Signed)
She is working to lose weight via diet and exercise.  

## 2012-02-17 NOTE — Progress Notes (Signed)
PCP: Crawford Givens, MD Primary Cardiologist:  Dr [name] is a 72 y.o. female who presents today for routine electrophysiology followup.  Since last being seen in our clinic, the patient reports doing very well. She no longer has exertional presyncope.  She does have some exertional shortness of breath which is stable.   Today, she denies symptoms of palpitations, chest pain, shortness of breath,  lower extremity edema, dizziness, presyncope, or syncope.  The patient is otherwise without complaint today.   Past Medical History  Diagnosis Date  . Hypothyroidism 1990  . Labile hypertension 08/1991  . HLD (hyperlipidemia) 01/1997  . Fibromyalgia   . Bladder spasms   . Basilar artery stenosis     90%  . CAD (coronary artery disease)     a) 06/2009 cath: severe stenosis RCA with placement drug eluting stent  b) 08/2011 cath: normal left main, luminal LAD irregularities, luminal LCx irregularities, luminal RCA irregularities and no ISR to mid-RCA DES, LVEF 60-65%  . IC (interstitial cystitis)   . Obesity   . Hypertension   . Blood transfusion   . Pneumonia   . CVA (cerebral infarction) December 2012  . Pacemaker 11/11/2011    dual chamber pacemaker  . Second degree Mobitz II AV block    Past Surgical History  Procedure Laterality Date  . Bladder suspension  1974  . Total abdominal hysterectomy  1974    dysmennorhea  . Cholecystectomy  1985  . Cardiac catheterization  1993    normal  . Renal angioplasty  1993    normal  . Carotid US  11/93    Occl L comm carotid  . Visual evoked response  11/93; 1/01    normal  . US renal/aorta  12/93    Normal  . Emg  1/01    LE, RUE normal  . Carotid US  4/98    L comm occl R-ok  . Carotid US  01/30/03    L occl  R less 40%  . Dexa  10/15/04    Troch -0.4 o/w pos  . Carotid arteriogram      bilat  . Bilateral vert & subclavian angiogram  07/19/08    85-90% stenosis mid basilar art; occluded left vert artery; occluded left  comm carotid artery w/ collat flow  . Basilar artery angioplasty  09/04/08    sm right brachial hematoma  . Cath single vess dz  06/13/09    70% stenosis RCA EF 65-70%  . Coronary angioplasty with stent placement  06/19/09  . Cataract extraction w/ intraocular lens implant  2007-2008  . Eye surgery    . Pacemaker insertion  11/11/11    MDT Adapta L implanted by Dr Johney Frame    Current Outpatient Prescriptions  Medication Sig Dispense Refill  . amLODipine (NORVASC) 5 MG tablet TAKE 1 TABLET BY MOUTH DAILY.  90 tablet  3  . clopidogrel (PLAVIX) 75 MG tablet TAKE 1 TABLET BY MOUTH EVERY DAY  30 tablet  11  . Coenzyme Q10 200 MG capsule Take 200 mg by mouth 2 (two) times daily.      Marland Kitchen levothyroxine (SYNTHROID, LEVOTHROID) 100 MCG tablet Take 100 mcg by mouth daily.      Marland Kitchen losartan-hydrochlorothiazide (HYZAAR) 100-25 MG per tablet Take 1 tablet by mouth daily.      . nitroGLYCERIN (NITROSTAT) 0.4 MG SL tablet Place 0.4 mg under the tongue every 5 (five) minutes as needed. For chest pain      .  Omega-3 Fatty Acids (FISH OIL BURP-LESS) 500 MG CAPS Take 1,000 mg by mouth 2 (two) times daily.       . pantoprazole (PROTONIX) 40 MG tablet Take 1 tablet (40 mg total) by mouth daily.  30 tablet  5  . simvastatin (ZOCOR) 10 MG tablet TAKE 1 TABLET BY MOUTH DAILY AT 6 PM.  30 tablet  10   No current facility-administered medications for this visit.    Physical Exam: Filed Vitals:   02/17/12 0900  BP: 151/71  Pulse: 63  Height: 5\' 1"  (1.549 m)  Weight: 207 lb 12.8 oz (94.257 kg)    GEN- The patient is well appearing, alert and oriented x 3 today.   Head- normocephalic, atraumatic Eyes-  Sclera clear, conjunctiva pink Ears- hearing intact Oropharynx- clear Lungs- Clear to ausculation bilaterally, normal work of breathing Chest- pacemaker pocket is well healed Heart- Regular rate and rhythm, no murmurs, rubs or gallops, PMI not laterally displaced GI- soft, NT, ND, + BS Extremities- no clubbing,  cyanosis, or edema  Pacemaker interrogation- reviewed in detail today,  See PACEART report  Assessment and Plan:  1. Mobitz II second degree AV block Normal pacemaker function See Pace Art report No changes today  2. Exertional shortness of breath This is due to AV conduction disease and may not be resolved with pacing long term.  Im not sure we can improve on this.  I will repeat a GXT.

## 2012-02-17 NOTE — Patient Instructions (Signed)
Your physician has requested that you have an exercise tolerance test. For further information please visit [email or web address] Please also follow instruction sheet, as given. IN November WITH DR Johney Frame

## 2012-03-09 ENCOUNTER — Other Ambulatory Visit: Payer: Self-pay

## 2012-03-09 MED ORDER — CLOPIDOGREL BISULFATE 75 MG PO TABS: ORAL_TABLET | ORAL | Status: DC

## 2012-03-09 MED ORDER — SIMVASTATIN 10 MG PO TABS: ORAL_TABLET | ORAL | Status: DC

## 2012-03-09 MED ORDER — LOSARTAN POTASSIUM-HCTZ 100-25 MG PO TABS: 1.0000 | ORAL_TABLET | Freq: Every day | ORAL | Status: DC

## 2012-03-09 MED ORDER — PANTOPRAZOLE SODIUM 40 MG PO TBEC: 40.0000 mg | DELAYED_RELEASE_TABLET | Freq: Every day | ORAL | Status: DC

## 2012-03-09 NOTE — Telephone Encounter (Signed)
Pt request 90 day refill on simvastatin,plavix,protonix and losartan-HCTZ x 1 refill to [facility]. Notified pt done.

## 2012-04-29 ENCOUNTER — Ambulatory Visit (INDEPENDENT_AMBULATORY_CARE_PROVIDER_SITE_OTHER): Payer: Medicare HMO | Admitting: Family Medicine

## 2012-04-29 ENCOUNTER — Encounter: Payer: Self-pay | Admitting: Family Medicine

## 2012-04-29 VITALS — BP 132/70 | HR 70 | Temp 98.3°F | Wt 207.5 lb

## 2012-04-29 DIAGNOSIS — H0289 Other specified disorders of eyelid: Secondary | ICD-10-CM

## 2012-04-29 MED ORDER — TRIAMCINOLONE ACETONIDE 0.1 % EX CREA
TOPICAL_CREAM | Freq: Two times a day (BID) | CUTANEOUS | Status: DC
Start: 2012-04-29 — End: 2012-05-25

## 2012-04-29 NOTE — Progress Notes (Signed)
L eye symptoms. Puffy and lid is irritated.  Started today.  She changed makeup recently.  No eye discharge. The skin on the outer portion of the L lids feels itchy and scaly.  No vision changes.  No eye pain.  The L eye waters some. She put a cold compress on the lids today with some relief of swelling. The lid is still itchy.  She used a steroid cream before, years ago, for similar.    Meds, vitals, and allergies reviewed.   ROS: See HPI.  Otherwise, noncontributory.  nad ncat except for L upper and lower lids puffy w/o erythema but eye exam wnl o/w- PERRL, EOMI, fundus wnl x2.  Conjunctiva wnl and and no FB.  Nasal and ear exam wnl OP wnl

## 2012-04-29 NOTE — Patient Instructions (Addendum)
Use a small amount of the cream twice a day as needed on the lids.  Don't get it in your eye.  Take care.  If this gets worse, I'll need you to see the eye clinic.

## 2012-05-01 DIAGNOSIS — H0289 Other specified disorders of eyelid: Secondary | ICD-10-CM | POA: Insufficient documentation

## 2012-05-01 NOTE — Assessment & Plan Note (Signed)
Superficial, local, external.  No eye or inner lid involvement.  Would use topical TAC now and f/u prn at the eye clinic.  She agrees.  Could be due to prev eye makeup.  F/u prn. No vision changes.

## 2012-05-18 ENCOUNTER — Other Ambulatory Visit: Payer: Self-pay | Admitting: Family Medicine

## 2012-05-18 NOTE — Telephone Encounter (Signed)
Received refill request electronically. Please advise when patient needs lab work? Is it okay to refill?

## 2012-05-19 NOTE — Telephone Encounter (Signed)
Sent, would schedule a physical for this summer.  We can do labs then.  Thanks.

## 2012-05-25 ENCOUNTER — Encounter: Payer: Self-pay | Admitting: Cardiovascular Disease

## 2012-05-25 ENCOUNTER — Ambulatory Visit (INDEPENDENT_AMBULATORY_CARE_PROVIDER_SITE_OTHER): Payer: Medicare HMO | Admitting: Cardiovascular Disease

## 2012-05-25 VITALS — BP 158/78 | HR 68 | Ht 61.0 in | Wt 206.0 lb

## 2012-05-25 DIAGNOSIS — I2510 Atherosclerotic heart disease of native coronary artery without angina pectoris: Secondary | ICD-10-CM

## 2012-05-25 MED ORDER — FUROSEMIDE 40 MG PO TABS: ORAL_TABLET | ORAL | Status: DC

## 2012-05-25 NOTE — Patient Instructions (Signed)
Your physician wants you to follow-up in:  6 months.  You will receive a reminder letter in the mail two months in advance. If you don't receive a letter, please call our office to schedule the follow-up appointment.  Your physician has recommended you make the following change in your medication:  Start furosemide 40 mg by mouth daily as needed

## 2012-05-25 NOTE — Progress Notes (Signed)
History of Present Illness: 72 yo WF with h/o CAD with DES RCA in June 2011, HTN, hyperlipidemia, hypothyroidism, and basilar artery stenosis here today for follow up. Left heart cath on 06/13/09 and she was found to have a severe stenosis in the RCA. There was minor disease in the LAD and Circumflex. She was brought back in for PCI on 06/19/09 and I placed a single DES in the RCA. She was admitted to [facility] 08/10/11 with dizziness, chest pain and repeat cath showed stable disease. She was observed to have second degree AV block demonstrated with RBBB. Notes during that hospital visit support multiple consecutive P waves not conducted. Her coreg was stopped. She was discharged and wore an event monitor. This did not document arrhythmias or advanced AV block. There were no episodes of AV block documented. GXT was performed in our office 09/01/11. She was observed during the study to have a LBBB with exertion. She developed exertional symptoms of fatigue, SOB, and CP which corresponded to her LBBB. She was seen by Dr. Johney Frame and continued to have episodes of palpitations and dizziness with moderate activity. A permanent pacemaker was placed on 11/11/11 by Dr. Johney Frame. She has been undergoing cerebral angiograms for basilar artery stenosis and has had angiplasty of the basilar artery in August 2010. She is known to have an occluded left common carotid artery. She was admitted to [facility] December 2012 with a CVA. She is being followed by Neurology.   She is here today for follow up. She is feeling well. No chest pain or SOB. Some fluid retention.   Primary Care Physician: Dr. Para March   Last Lipid Profile:Lipid Panel     Component Value Date/Time   CHOL 155 12/08/2011 0853   TRIG 149.0 12/08/2011 0853   HDL 40.90 12/08/2011 0853   CHOLHDL 4 12/08/2011 0853   VLDL 29.8 12/08/2011 0853   LDLCALC 84 12/08/2011 0853     Past Medical History  Diagnosis Date  . Hypothyroidism 1990  . Labile hypertension  08/1991  . HLD (hyperlipidemia) 01/1997  . Fibromyalgia   . Bladder spasms   . Basilar artery stenosis     90%  . CAD (coronary artery disease)     a) 06/2009 cath: severe stenosis RCA with placement drug eluting stent  b) 08/2011 cath: normal left main, luminal LAD irregularities, luminal LCx irregularities, luminal RCA irregularities and no ISR to mid-RCA DES, LVEF 60-65%  . IC (interstitial cystitis)   . Obesity   . Hypertension   . Blood transfusion   . Pneumonia   . CVA (cerebral infarction) December 2012  . Pacemaker 11/11/2011    dual chamber pacemaker  . Second degree Mobitz II AV block     Past Surgical History  Procedure Laterality Date  . Bladder suspension  1974  . Total abdominal hysterectomy  1974    dysmennorhea  . Cholecystectomy  1985  . Cardiac catheterization  1993    normal  . Renal angioplasty  1993    normal  . Carotid US  11/93    Occl L comm carotid  . Visual evoked response  11/93; 1/01    normal  . US renal/aorta  12/93    Normal  . Emg  1/01    LE, RUE normal  . Carotid US  4/98    L comm occl R-ok  . Carotid US  01/30/03    L occl  R less 40%  . Dexa  10/15/04  Troch -0.4 o/w pos  . Carotid arteriogram      bilat  . Bilateral vert & subclavian angiogram  07/19/08    85-90% stenosis mid basilar art; occluded left vert artery; occluded left comm carotid artery w/ collat flow  . Basilar artery angioplasty  09/04/08    sm right brachial hematoma  . Cath single vess dz  06/13/09    70% stenosis RCA EF 65-70%  . Coronary angioplasty with stent placement  06/19/09  . Cataract extraction w/ intraocular lens implant  2007-2008  . Eye surgery    . Pacemaker insertion  11/11/11    MDT Adapta L implanted by Dr Johney Frame    Current Outpatient Prescriptions  Medication Sig Dispense Refill  . amLODipine (NORVASC) 5 MG tablet TAKE 1 TABLET BY MOUTH DAILY.  90 tablet  3  . clopidogrel (PLAVIX) 75 MG tablet TAKE 1 TABLET BY MOUTH EVERY DAY  90 tablet  1  .  Coenzyme Q10 200 MG capsule Take 200 mg by mouth 2 (two) times daily.      Marland Kitchen levothyroxine (SYNTHROID, LEVOTHROID) 100 MCG tablet Take 100 mcg by mouth daily.      Marland Kitchen losartan-hydrochlorothiazide (HYZAAR) 100-25 MG per tablet Take 1 tablet by mouth daily.  90 tablet  1  . nitroGLYCERIN (NITROSTAT) 0.4 MG SL tablet Place 0.4 mg under the tongue every 5 (five) minutes as needed. For chest pain      . Omega-3 Fatty Acids (FISH OIL BURP-LESS) 500 MG CAPS Take 1,000 mg by mouth 2 (two) times daily.       . pantoprazole (PROTONIX) 40 MG tablet Take 1 tablet (40 mg total) by mouth daily.  90 tablet  1  . simvastatin (ZOCOR) 10 MG tablet TAKE 1 TABLET BY MOUTH DAILY AT 6 PM.  90 tablet  1   No current facility-administered medications for this visit.    Allergies  Allergen Reactions  . Anesthetics, Amide     REACTION:FEVER, SYNCOPE  . Codeine Nausea And Vomiting    Reaction:Hallucinations  . Nitrofurantoin Nausea Only    REACTION: Hallucinations  . Orphenadrine Citrate Nausea And Vomiting    REACTION: Hallucination  . Pentosan Polysulfate Sodium     REACTION: rash/ severe headache    History   Social History  . Marital Status: Married    Spouse Name: N/A    Number of Children: 1  . Years of Education: N/A   Occupational History  . Bookkeeper-part time    Social History Main Topics  . Smoking status: Never Smoker   . Smokeless tobacco: Never Used  . Alcohol Use: No  . Drug Use: No  . Sexually Active: No   Other Topics Concern  . 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 Lung cancer Father   . Heart attack Mother   . Lymphoma Mother     s/p chemo  . Stroke Mother     x 2  . Diabetes Brother     Review of Systems:  As stated in the HPI and otherwise negative.   BP 158/78  Pulse 68  Ht 5\' 1"  (1.549 m)  Wt 206  lb (93.441 kg)  BMI 38.94 kg/m2  Physical Examination: General: Well developed, well nourished, NAD HEENT: OP clear, mucus membranes  moist SKIN: warm, dry. No rashes. Neuro: No focal deficits Musculoskeletal: Muscle strength 5/5 all ext Psychiatric: Mood and affect normal Neck: No JVD, no carotid bruits, no thyromegaly, no lymphadenopathy. Lungs:Clear bilaterally, no wheezes, rhonci, crackles Cardiovascular: Regular rate and rhythm. No murmurs, gallops or rubs. Abdomen:Soft. Bowel sounds present. Non-tender.  Extremities: No lower extremity edema. Pulses are 2 + in the bilateral DP/PT.  Assessment and Plan:   1. CAD: Stable. No changes today. Continue Plavix and statin. Will give her Lasix 40 mg po Qdaily prn bloating, fluid retention.   2. High grade symptomatic AV block: s/p PPM 11/10/11 per Dr. Johney Frame. Followed in EP clinic.

## 2012-06-02 ENCOUNTER — Ambulatory Visit: Payer: Medicare HMO | Admitting: Cardiovascular Disease

## 2012-06-20 ENCOUNTER — Telehealth: Payer: Self-pay | Admitting: Family Medicine

## 2012-06-20 MED ORDER — PHENAZOPYRIDINE HCL 200 MG PO TABS: 200.0000 mg | ORAL_TABLET | Freq: Two times a day (BID) | ORAL | Status: DC | PRN

## 2012-06-20 NOTE — Telephone Encounter (Signed)
Patient advised.

## 2012-06-20 NOTE — Telephone Encounter (Signed)
Sent, if not improved then f/u at the clinic.  Thanks.

## 2012-06-20 NOTE — Telephone Encounter (Signed)
Pt states that she has interstitial cystitis and suffers with bladder spasms every now and then.  Pt states it has been  Awhile since she last had spasms but she can feel it coming on again.  Pt is calling for medication to help with the spasms (dark pills that turn her urine orange .... Pyridium?) Pt is requesting that the medication to be called into her pharmacy ([facility] [address])  OFFICE, please follow up with patient.

## 2012-08-31 ENCOUNTER — Other Ambulatory Visit: Payer: Self-pay | Admitting: Family Medicine

## 2012-10-13 ENCOUNTER — Ambulatory Visit (INDEPENDENT_AMBULATORY_CARE_PROVIDER_SITE_OTHER): Payer: Medicare HMO

## 2012-10-13 DIAGNOSIS — Z23 Encounter for immunization: Secondary | ICD-10-CM

## 2012-10-19 ENCOUNTER — Telehealth: Payer: Self-pay | Admitting: Family Medicine

## 2012-10-19 NOTE — Telephone Encounter (Signed)
Patient received a summons for jury duty.  She takes diuretics and has to go to the bathroom frequently.  She said she can't sit through jury duty and she couldn't be away from her husband for long.  Patient asked for you to check the file because Dr.Schaller wrote her excuses to be out of jury duty.

## 2012-10-19 NOTE — Telephone Encounter (Signed)
Advised patient that letter is up front and ready for pickup.

## 2012-10-19 NOTE — Telephone Encounter (Signed)
Letter printed.

## 2012-11-14 ENCOUNTER — Encounter: Payer: Self-pay | Admitting: Nurse Practitioner

## 2012-11-14 ENCOUNTER — Encounter: Payer: Self-pay | Admitting: Internal Medicine

## 2012-11-14 ENCOUNTER — Ambulatory Visit (INDEPENDENT_AMBULATORY_CARE_PROVIDER_SITE_OTHER): Payer: Medicare HMO | Admitting: Nurse Practitioner

## 2012-11-14 ENCOUNTER — Ambulatory Visit (INDEPENDENT_AMBULATORY_CARE_PROVIDER_SITE_OTHER): Payer: Medicare HMO | Admitting: Internal Medicine

## 2012-11-14 ENCOUNTER — Other Ambulatory Visit: Payer: Self-pay | Admitting: Family Medicine

## 2012-11-14 VITALS — BP 138/84 | HR 81 | Ht 60.0 in | Wt 202.8 lb

## 2012-11-14 DIAGNOSIS — I442 Atrioventricular block, complete: Secondary | ICD-10-CM

## 2012-11-14 DIAGNOSIS — I2510 Atherosclerotic heart disease of native coronary artery without angina pectoris: Secondary | ICD-10-CM

## 2012-11-14 DIAGNOSIS — R0609 Other forms of dyspnea: Secondary | ICD-10-CM

## 2012-11-14 DIAGNOSIS — R0600 Dyspnea, unspecified: Secondary | ICD-10-CM

## 2012-11-14 DIAGNOSIS — I10 Essential (primary) hypertension: Secondary | ICD-10-CM

## 2012-11-14 DIAGNOSIS — I441 Atrioventricular block, second degree: Secondary | ICD-10-CM

## 2012-11-14 LAB — MDC_IDC_ENUM_SESS_TYPE_INCLINIC
Battery Impedance: 100 Ohm
Brady Statistic AP VP Percent: 0 %
Brady Statistic AS VP Percent: 0 %
Brady Statistic AS VS Percent: 73 %
Lead Channel Impedance Value: 510 Ohm
Lead Channel Impedance Value: 578 Ohm
Lead Channel Pacing Threshold Amplitude: 0.5 V
Lead Channel Sensing Intrinsic Amplitude: 5.6 mV
Lead Channel Sensing Intrinsic Amplitude: 8 mV
Lead Channel Setting Pacing Amplitude: 2 V

## 2012-11-14 NOTE — Progress Notes (Signed)
Exercise Treadmill Test  Pre-Exercise Testing Evaluation Rhythm: normal sinus  Rate: 81     Test  Exercise Tolerance Test Ordering MD: Melene Muller, MD  Interpreting MD: Sunday Spillers  Unique Test No: 1  Treadmill:  1  Indication for ETT: known ASHD  Contraindication to ETT: No   Stress Modality: exercise - treadmill  Cardiac Imaging Performed: non   Protocol: standard Bruce - maximal  Max BP:    Max MPHR (bpm):  150 85% MPR (bpm):  128  MPHR obtained (bpm):   % MPHR obtained:    Reached 85% MPHR (min:sec):   Total Exercise Time (min-sec):    Workload in METS:   Borg Scale:   Reason ETT Terminated:      ST Segment Analysis At Rest:  With Exercise:   Other Information Arrhythmia:   Angina during ETT:   Quality of ETT:    ETT Interpretation:    Comments: Patient presents today for a GXT (ordered from February of 2014 per Dr. Johney Frame). Had exertional shortness of breath. Known CAD. Known occluded L carotid. Saw Dr. Clifton James in May of 2014 and comes back to see him next week. Has pacemaker in place.   Discussed with Dr. Johney Frame - will cancel GXT - not allowed to walk with total occlusion of the carotid per protocol - seeing Dr. Clifton James next week - will let him decide if further testing i.e, Myoview is needed.   Recommendations: Keep follow up with Dr. Clifton James for next week.   Rosalio Macadamia, RN, ANP-C [facility] [address] [place], [place]  [address]

## 2012-11-14 NOTE — Telephone Encounter (Signed)
Fill for now, due for CPE, please schedule.  Thanks.

## 2012-11-14 NOTE — Progress Notes (Signed)
PCP: Crawford Givens, MD Primary Cardiologist:  Dr [name] is a 72 y.o. female who presents today for routine electrophysiology followup.  Since last being seen in our clinic, the patient reports doing very well. She no longer has exertional presyncope.  Her exercise tolerance is improved.   Today, she denies symptoms of palpitations, chest pain, shortness of breath,  lower extremity edema, dizziness, presyncope, or syncope.  The patient is otherwise without complaint today.   Past Medical History  Diagnosis Date  . Hypothyroidism 1990  . Labile hypertension 08/1991  . HLD (hyperlipidemia) 01/1997  . Fibromyalgia   . Bladder spasms   . Basilar artery stenosis     90%  . CAD (coronary artery disease)     a) 06/2009 cath: severe stenosis RCA with placement drug eluting stent  b) 08/2011 cath: normal left main, luminal LAD irregularities, luminal LCx irregularities, luminal RCA irregularities and no ISR to mid-RCA DES, LVEF 60-65%  . IC (interstitial cystitis)   . Obesity   . Hypertension   . Blood transfusion   . Pneumonia   . CVA (cerebral infarction) December 2012  . Pacemaker 11/11/2011    dual chamber pacemaker  . Second degree Mobitz II AV block    Past Surgical History  Procedure Laterality Date  . Bladder suspension  1974  . Total abdominal hysterectomy  1974    dysmennorhea  . Cholecystectomy  1985  . Cardiac catheterization  1993    normal  . Renal angioplasty  1993    normal  . Carotid US  11/93    Occl L comm carotid  . Visual evoked response  11/93; 1/01    normal  . US renal/aorta  12/93    Normal  . Emg  1/01    LE, RUE normal  . Carotid US  4/98    L comm occl R-ok  . Carotid US  01/30/03    L occl  R less 40%  . Dexa  10/15/04    Troch -0.4 o/w pos  . Carotid arteriogram      bilat  . Bilateral vert & subclavian angiogram  07/19/08    85-90% stenosis mid basilar art; occluded left vert artery; occluded left comm carotid artery w/ collat  flow  . Basilar artery angioplasty  09/04/08    sm right brachial hematoma  . Cath single vess dz  06/13/09    70% stenosis RCA EF 65-70%  . Coronary angioplasty with stent placement  06/19/09  . Cataract extraction w/ intraocular lens implant  2007-2008  . Eye surgery    . Pacemaker insertion  11/11/11    MDT Adapta L implanted by Dr Johney Frame    Current Outpatient Prescriptions  Medication Sig Dispense Refill  . amLODipine (NORVASC) 5 MG tablet TAKE 1 TABLET BY MOUTH DAILY.  90 tablet  3  . clopidogrel (PLAVIX) 75 MG tablet TAKE 1 TABLET BY MOUTH EVERY DAY  90 tablet  1  . Coenzyme Q10 200 MG capsule Take 200 mg by mouth 2 (two) times daily.      . furosemide (LASIX) 40 MG tablet Take one tablet by mouth daily as needed  30 tablet  3  . lansoprazole (PREVACID) 15 MG capsule Take 15 mg by mouth as needed.      Marland Kitchen levothyroxine (SYNTHROID, LEVOTHROID) 100 MCG tablet Take 100 mcg by mouth daily.      Marland Kitchen losartan-hydrochlorothiazide (HYZAAR) 100-25 MG per tablet TAKE 1 TABLET BY MOUTH  DAILY.  90 tablet  1  . nitroGLYCERIN (NITROSTAT) 0.4 MG SL tablet Place 0.4 mg under the tongue every 5 (five) minutes as needed. For chest pain      . Omega-3 Fatty Acids (FISH OIL BURP-LESS) 500 MG CAPS Take 1,000 mg by mouth 2 (two) times daily.       . phenazopyridine (PYRIDIUM) 200 MG tablet Take 1 tablet (200 mg total) by mouth 2 (two) times daily as needed for pain.  6 tablet  0  . simvastatin (ZOCOR) 10 MG tablet TAKE 1 TABLET BY MOUTH DAILY AT 6 PM.  90 tablet  1   No current facility-administered medications for this visit.    Physical Exam: Filed Vitals:   11/14/12 1246  BP: 138/84  Pulse: 81  Height: 5' (1.524 m)  Weight: 202 lb 12.8 oz (91.989 kg)    GEN- The patient is well appearing, alert and oriented x 3 today.   Head- normocephalic, atraumatic Eyes-  Sclera clear, conjunctiva pink Ears- hearing intact Oropharynx- clear Lungs- Clear to ausculation bilaterally, normal work of  breathing Chest- pacemaker pocket is well healed Heart- Regular rate and rhythm, no murmurs, rubs or gallops, PMI not laterally displaced GI- soft, NT, ND, + BS Extremities- no clubbing, cyanosis, or edema  Pacemaker interrogation- reviewed in detail today,  See PACEART report  Assessment and Plan:  1. Mobitz II second degree AV block Normal pacemaker function See Pace Art report No changes today  2. Exertional shortness of breath Stable No changes  [facility] Return in 1 year

## 2012-11-14 NOTE — Progress Notes (Signed)
REviewed. cdm

## 2012-11-14 NOTE — Patient Instructions (Addendum)
Your physician wants you to follow-up in: 12 months with Dr Jacquiline Doe will receive a reminder letter in the mail two months in advance. If you don't receive a letter, please call our office to schedule the follow-up appointment.      Remote monitoring is used to monitor your Pacemaker or ICD from home. This monitoring reduces the number of office visits required to check your device to one time per year. It allows [place] to keep an eye on the functioning of your device to ensure it is working properly. You are scheduled for a device check from home on 02/16/13 ransmission at any time that day. If you have a wireless device, the transmission will be sent automatically. After your physician reviews your transmission, you will receive a postcard with your next transmission date.

## 2012-11-14 NOTE — Telephone Encounter (Signed)
Electronic refill request.   Does she need a TSH?  Please advise.

## 2012-11-15 NOTE — Telephone Encounter (Signed)
Left detailed message on voicemail.  

## 2012-11-17 ENCOUNTER — Other Ambulatory Visit (HOSPITAL_COMMUNITY): Payer: Self-pay | Admitting: Interventional Radiology

## 2012-11-17 DIAGNOSIS — I651 Occlusion and stenosis of basilar artery: Secondary | ICD-10-CM

## 2012-11-18 ENCOUNTER — Encounter: Payer: Self-pay | Admitting: Internal Medicine

## 2012-11-23 ENCOUNTER — Encounter: Payer: Self-pay | Admitting: Cardiovascular Disease

## 2012-11-23 ENCOUNTER — Ambulatory Visit (INDEPENDENT_AMBULATORY_CARE_PROVIDER_SITE_OTHER): Payer: Commercial Managed Care - HMO | Admitting: Cardiovascular Disease

## 2012-11-23 VITALS — BP 140/72 | HR 70 | Ht 62.0 in | Wt 202.0 lb

## 2012-11-23 DIAGNOSIS — I2510 Atherosclerotic heart disease of native coronary artery without angina pectoris: Secondary | ICD-10-CM

## 2012-11-23 DIAGNOSIS — I441 Atrioventricular block, second degree: Secondary | ICD-10-CM

## 2012-11-23 NOTE — Patient Instructions (Signed)
Your physician wants you to follow-up in:  6 months. You will receive a reminder letter in the mail two months in advance. If you don't receive a letter, please call our office to schedule the follow-up appointment.   

## 2012-11-23 NOTE — Progress Notes (Signed)
History of Present Illness: 72 yo WF with h/o CAD with DES RCA in June 2011, HTN, hyperlipidemia, hypothyroidism, AV block s/p PPM, CVA and basilar artery stenosis here today for follow up. Left heart cath on 06/13/09 and she was found to have a severe stenosis in the RCA. There was minor disease in the LAD and Circumflex. She was brought back in for PCI on 06/19/09 and I placed a single DES in the RCA. She was admitted to [facility] 08/10/11 with dizziness, chest pain and repeat cath showed stable disease. She was observed to have second degree AV block demonstrated with RBBB. Notes during that hospital visit support multiple consecutive P waves not conducted. Her coreg was stopped. She was discharged and wore an event monitor. This did not document arrhythmias or advanced AV block. There were no episodes of AV block documented. GXT was performed in our office 09/01/11. She was observed during the study to have a LBBB with exertion. She developed exertional symptoms of fatigue, SOB, and CP which corresponded to her LBBB. She was seen by Dr. Johney Frame and continued to have episodes of palpitations and dizziness with moderate activity. A permanent pacemaker was placed on 11/11/11 by Dr. Johney Frame. Last EP visit 11/14/12 and her pacemaker was functioning well. She has been undergoing cerebral angiograms for basilar artery stenosis and has had angiplasty of the basilar artery in August 2010. She is known to have an occluded left common carotid artery. She was admitted to [facility] December 2012 with a CVA. She is being followed by Neurology.   She is here today for follow up. She is feeling well. No chest pain or SOB. She is exercising every day. She does notice some weakness at peak exertion but no changes.   Primary Care Physician: Dr. Para March   Last Lipid Profile:Lipid Panel     Component Value Date/Time   CHOL 155 12/08/2011 0853   TRIG 149.0 12/08/2011 0853   HDL 40.90 12/08/2011 0853   CHOLHDL 4 12/08/2011  0853   VLDL 29.8 12/08/2011 0853   LDLCALC 84 12/08/2011 0853     Past Medical History  Diagnosis Date  . Hypothyroidism 1990  . Labile hypertension 08/1991  . HLD (hyperlipidemia) 01/1997  . Fibromyalgia   . Bladder spasms   . Basilar artery stenosis     90%  . CAD (coronary artery disease)     a) 06/2009 cath: severe stenosis RCA with placement drug eluting stent  b) 08/2011 cath: normal left main, luminal LAD irregularities, luminal LCx irregularities, luminal RCA irregularities and no ISR to mid-RCA DES, LVEF 60-65%  . IC (interstitial cystitis)   . Obesity   . Hypertension   . Blood transfusion   . Pneumonia   . CVA (cerebral infarction) December 2012  . Pacemaker 11/11/2011    dual chamber pacemaker  . Second degree Mobitz II AV block     Past Surgical History  Procedure Laterality Date  . Bladder suspension  1974  . Total abdominal hysterectomy  1974    dysmennorhea  . Cholecystectomy  1985  . Cardiac catheterization  1993    normal  . Renal angioplasty  1993    normal  . Carotid US  11/93    Occl L comm carotid  . Visual evoked response  11/93; 1/01    normal  . US renal/aorta  12/93    Normal  . Emg  1/01    LE, RUE normal  . Carotid US  4/98    L comm occl R-ok  . Carotid US  01/30/03    L occl  R less 40%  . Dexa  10/15/04    Troch -0.4 o/w pos  . Carotid arteriogram      bilat  . Bilateral vert & subclavian angiogram  07/19/08    85-90% stenosis mid basilar art; occluded left vert artery; occluded left comm carotid artery w/ collat flow  . Basilar artery angioplasty  09/04/08    sm right brachial hematoma  . Cath single vess dz  06/13/09    70% stenosis RCA EF 65-70%  . Coronary angioplasty with stent placement  06/19/09  . Cataract extraction w/ intraocular lens implant  2007-2008  . Eye surgery    . Pacemaker insertion  11/11/11    MDT Adapta L implanted by Dr Johney Frame    Current Outpatient Prescriptions  Medication Sig Dispense Refill  .  amLODipine (NORVASC) 5 MG tablet TAKE 1 TABLET BY MOUTH DAILY.  90 tablet  3  . clopidogrel (PLAVIX) 75 MG tablet TAKE 1 TABLET BY MOUTH EVERY DAY  90 tablet  1  . Coenzyme Q10 200 MG capsule Take 200 mg by mouth 2 (two) times daily.      . furosemide (LASIX) 40 MG tablet Take one tablet by mouth daily as needed  30 tablet  3  . lansoprazole (PREVACID) 15 MG capsule Take 15 mg by mouth as needed.      Marland Kitchen levothyroxine (SYNTHROID, LEVOTHROID) 100 MCG tablet Take 100 mcg by mouth daily.      Marland Kitchen levothyroxine (SYNTHROID, LEVOTHROID) 100 MCG tablet TAKE 1 TABLET DAILY AS DIRECTED  90 tablet  1  . losartan-hydrochlorothiazide (HYZAAR) 100-25 MG per tablet TAKE 1 TABLET BY MOUTH DAILY.  90 tablet  1  . nitroGLYCERIN (NITROSTAT) 0.4 MG SL tablet Place 0.4 mg under the tongue every 5 (five) minutes as needed. For chest pain      . Omega-3 Fatty Acids (FISH OIL BURP-LESS) 500 MG CAPS Take 1,000 mg by mouth 2 (two) times daily.       . phenazopyridine (PYRIDIUM) 200 MG tablet Take 1 tablet (200 mg total) by mouth 2 (two) times daily as needed for pain.  6 tablet  0  . simvastatin (ZOCOR) 10 MG tablet TAKE 1 TABLET BY MOUTH DAILY AT 6 PM.  90 tablet  1   No current facility-administered medications for this visit.    Allergies  Allergen Reactions  . Anesthetics, Amide     REACTION:FEVER, SYNCOPE  . Codeine Nausea And Vomiting    Reaction:Hallucinations  . Nitrofurantoin Nausea Only    REACTION: Hallucinations  . Orphenadrine Citrate Nausea And Vomiting    REACTION: Hallucination  . Pentosan Polysulfate Sodium     REACTION: rash/ severe headache    History   Social History  . Marital Status: Married    Spouse Name: N/A    Number of Children: 1  . Years of Education: N/A   Occupational History  . Bookkeeper-part time    Social History Main Topics  . Smoking status: Never Smoker   . Smokeless tobacco: Never Used  . Alcohol Use: No  . Drug Use: No  . Sexual Activity: No   Other Topics  Concern  . 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 Lung cancer Father   . Heart attack Mother   . Lymphoma Mother     s/p chemo  . Stroke Mother     x 2  . Diabetes Brother     Review of Systems:  As stated in the HPI and otherwise negative.   BP 140/72  Pulse 70  Ht 5\' 2"  (1.575 m)  Wt 202 lb (91.627 kg)  BMI 36.94 kg/m2  Physical Examination: General: Well developed, well nourished, NAD HEENT: OP clear, mucus membranes moist SKIN: warm, dry. No rashes. Neuro: No focal deficits Musculoskeletal: Muscle strength 5/5 all ext Psychiatric: Mood and affect normal Neck: No JVD, no carotid bruits, no thyromegaly, no lymphadenopathy. Lungs:Clear bilaterally, no wheezes, rhonci, crackles Cardiovascular: Regular rate and rhythm. No murmurs, gallops or rubs. Abdomen:Soft. Bowel sounds present. Non-tender.  Extremities: No lower extremity edema. Pulses are 2 + in the bilateral DP/PT.  Assessment and Plan:   1. CAD: Stable. No changes today. Continue Plavix and statin. Continue Lasix 40 mg po Qdaily prn bloating, fluid retention.   2. High grade symptomatic AV block: s/p PPM 11/10/11 per Dr. Johney Frame. Followed in EP clinic. Last check 11/14/12 and working well.

## 2013-01-01 ENCOUNTER — Other Ambulatory Visit: Payer: Self-pay | Admitting: Family Medicine

## 2013-01-11 ENCOUNTER — Other Ambulatory Visit (HOSPITAL_COMMUNITY): Payer: Self-pay | Admitting: Radiology

## 2013-01-11 ENCOUNTER — Other Ambulatory Visit (HOSPITAL_COMMUNITY): Payer: Self-pay | Admitting: Interventional Radiology

## 2013-01-11 DIAGNOSIS — I651 Occlusion and stenosis of basilar artery: Secondary | ICD-10-CM

## 2013-01-16 ENCOUNTER — Ambulatory Visit (HOSPITAL_COMMUNITY)
Admission: RE | Admit: 2013-01-16 | Discharge: 2013-01-16 | Disposition: A | Discharge status: Home / Self Care | Payer: Medicare HMO | Source: Ambulatory Visit | Attending: Interventional Radiology | Admitting: Interventional Radiology

## 2013-01-16 ENCOUNTER — Other Ambulatory Visit (HOSPITAL_COMMUNITY): Payer: Self-pay | Admitting: Interventional Radiology

## 2013-01-16 DIAGNOSIS — I651 Occlusion and stenosis of basilar artery: Secondary | ICD-10-CM

## 2013-01-16 DIAGNOSIS — I658 Occlusion and stenosis of other precerebral arteries: Secondary | ICD-10-CM | POA: Insufficient documentation

## 2013-01-16 DIAGNOSIS — I6529 Occlusion and stenosis of unspecified carotid artery: Secondary | ICD-10-CM | POA: Insufficient documentation

## 2013-01-16 LAB — BASIC METABOLIC PANEL
BUN: 17 mg/dL (ref 6–23)
CHLORIDE: 101 meq/L (ref 96–112)
CO2: 30 meq/L (ref 19–32)
CREATININE: 0.94 mg/dL (ref 0.50–1.10)
Calcium: 9.6 mg/dL (ref 8.4–10.5)
GFR calc Af Amer: 70 mL/min — ABNORMAL LOW (ref >90)
GFR calc non Af Amer: 60 mL/min — ABNORMAL LOW (ref >90)
Glucose, Bld: 101 mg/dL — ABNORMAL HIGH (ref 70–99)
Potassium: 3.8 mEq/L (ref 3.7–5.3)
Sodium: 142 mEq/L (ref 137–147)

## 2013-01-16 MED ORDER — IOHEXOL 350 MG/ML SOLN
80.0000 mL | Freq: Once | INTRAVENOUS | Status: AC | PRN
  Administered 2013-01-16: 80 mL via INTRAVENOUS

## 2013-01-26 ENCOUNTER — Telehealth: Payer: Self-pay | Admitting: Internal Medicine

## 2013-01-26 NOTE — Telephone Encounter (Signed)
New message     Pt do not know how to use her remote pacemaker box.  Please call before 2pm or tomorrow

## 2013-01-27 NOTE — Telephone Encounter (Signed)
Pt wanted clarification sending home transmission. Gave her step by step instructions while with her on her cell phone. Test transmission successfully received. Pt knows she can do her scheduled transmission anytime on 02/06/13.

## 2013-02-06 ENCOUNTER — Ambulatory Visit (INDEPENDENT_AMBULATORY_CARE_PROVIDER_SITE_OTHER): Payer: Medicare HMO | Admitting: *Deleted

## 2013-02-06 DIAGNOSIS — I441 Atrioventricular block, second degree: Secondary | ICD-10-CM

## 2013-02-06 LAB — MDC_IDC_ENUM_SESS_TYPE_REMOTE
Battery Impedance: 135 Ohm
Battery Remaining Longevity: 158 mo
Brady Statistic AP VP Percent: 0 %
Brady Statistic AP VS Percent: 24 %
Brady Statistic AS VP Percent: 0 %
Brady Statistic AS VS Percent: 76 %
Date Time Interrogation Session: 20150123142330
Lead Channel Impedance Value: 481 Ohm
Lead Channel Impedance Value: 558 Ohm
Lead Channel Pacing Threshold Amplitude: 0.375 V
Lead Channel Pacing Threshold Amplitude: 1.125 V
Lead Channel Pacing Threshold Pulse Width: 0.4 ms
Lead Channel Sensing Intrinsic Amplitude: 11.2 mV
Lead Channel Sensing Intrinsic Amplitude: 2.8 mV
Lead Channel Setting Pacing Pulse Width: 0.4 ms
Lead Channel Setting Sensing Sensitivity: 2.8 mV
MDC IDC MSMT BATTERY VOLTAGE: 2.79 V
MDC IDC MSMT LEADCHNL RV PACING THRESHOLD PULSEWIDTH: 0.4 ms
MDC IDC SET LEADCHNL RA PACING AMPLITUDE: 2 V
MDC IDC SET LEADCHNL RV PACING AMPLITUDE: 2.75 V

## 2013-02-12 ENCOUNTER — Other Ambulatory Visit: Payer: Self-pay | Admitting: Family Medicine

## 2013-02-12 DIAGNOSIS — E039 Hypothyroidism, unspecified: Secondary | ICD-10-CM

## 2013-02-12 DIAGNOSIS — E559 Vitamin D deficiency, unspecified: Secondary | ICD-10-CM

## 2013-02-12 DIAGNOSIS — I10 Essential (primary) hypertension: Secondary | ICD-10-CM

## 2013-02-13 ENCOUNTER — Other Ambulatory Visit (INDEPENDENT_AMBULATORY_CARE_PROVIDER_SITE_OTHER): Payer: Medicare HMO

## 2013-02-13 DIAGNOSIS — E039 Hypothyroidism, unspecified: Secondary | ICD-10-CM

## 2013-02-13 DIAGNOSIS — E559 Vitamin D deficiency, unspecified: Secondary | ICD-10-CM

## 2013-02-13 DIAGNOSIS — I10 Essential (primary) hypertension: Secondary | ICD-10-CM

## 2013-02-13 LAB — COMPREHENSIVE METABOLIC PANEL
ALBUMIN: 4.1 g/dL (ref 3.5–5.2)
ALK PHOS: 57 U/L (ref 39–117)
ALT: 23 U/L (ref 0–35)
AST: 21 U/L (ref 0–37)
BUN: 14 mg/dL (ref 6–23)
CO2: 29 mEq/L (ref 19–32)
Calcium: 9.4 mg/dL (ref 8.4–10.5)
Chloride: 103 mEq/L (ref 96–112)
Creatinine, Ser: 1 mg/dL (ref 0.4–1.2)
GFR: 61.02 mL/min (ref >60.00)
Glucose, Bld: 97 mg/dL (ref 70–99)
Potassium: 3.5 mEq/L (ref 3.5–5.1)
Sodium: 140 mEq/L (ref 135–145)
Total Bilirubin: 0.7 mg/dL (ref 0.3–1.2)
Total Protein: 7.2 g/dL (ref 6.0–8.3)

## 2013-02-13 LAB — LIPID PANEL
CHOL/HDL RATIO: 4
Cholesterol: 159 mg/dL (ref 0–200)
HDL: 42.7 mg/dL (ref >39.00)
LDL CALC: 85 mg/dL (ref 0–99)
TRIGLYCERIDES: 159 mg/dL — AB (ref 0.0–149.0)
VLDL: 31.8 mg/dL (ref 0.0–40.0)

## 2013-02-13 LAB — TSH: TSH: 0.51 u[IU]/mL (ref 0.35–5.50)

## 2013-02-14 LAB — VITAMIN D 25 HYDROXY (VIT D DEFICIENCY, FRACTURES): Vit D, 25-Hydroxy: 39 ng/mL (ref 30–89)

## 2013-02-20 ENCOUNTER — Encounter: Payer: Self-pay | Admitting: Family Medicine

## 2013-02-20 ENCOUNTER — Ambulatory Visit (INDEPENDENT_AMBULATORY_CARE_PROVIDER_SITE_OTHER): Payer: Medicare HMO | Admitting: Family Medicine

## 2013-02-20 VITALS — BP 116/78 | HR 104 | Temp 97.8°F | Ht 60.25 in | Wt 202.0 lb

## 2013-02-20 DIAGNOSIS — Z1211 Encounter for screening for malignant neoplasm of colon: Secondary | ICD-10-CM

## 2013-02-20 DIAGNOSIS — Z780 Asymptomatic menopausal state: Secondary | ICD-10-CM

## 2013-02-20 DIAGNOSIS — E7800 Pure hypercholesterolemia, unspecified: Secondary | ICD-10-CM

## 2013-02-20 DIAGNOSIS — Z1231 Encounter for screening mammogram for malignant neoplasm of breast: Secondary | ICD-10-CM

## 2013-02-20 DIAGNOSIS — E039 Hypothyroidism, unspecified: Secondary | ICD-10-CM

## 2013-02-20 DIAGNOSIS — I10 Essential (primary) hypertension: Secondary | ICD-10-CM

## 2013-02-20 DIAGNOSIS — Z0000 Encounter for general adult medical examination without abnormal findings: Secondary | ICD-10-CM

## 2013-02-20 NOTE — Progress Notes (Signed)
Pre-visit discussion using our clinic review tool. No additional management support is needed unless otherwise documented below in the visit note.  I have personally reviewed the Medicare Annual Wellness questionnaire and have noted 1. The patient's medical and social history 2. Their use of alcohol, tobacco or illicit drugs 3. Their current medications and supplements 4. The patient's functional ability including ADL's, fall risks, home safety risks and hearing or visual             impairment. 5. Diet and physical activities 6. Evidence for depression or mood disorders  The patients weight, height, BMI have been recorded in the chart and visual acuity is per eye clinic.  I have made referrals, counseling and provided education to the patient based review of the above and I have provided the pt with a written personalized care plan for preventive services.  See scanned forms.  Routine anticipatory guidance given to patient.  See health maintenance. Flu 2014 Shingles d/w pt.   PNA 2010 Tetanus d/w pt. Done in 1999 D/w patient ZO:XWRUEAVre:options for colon cancer screening, including IFOB vs. colonoscopy.  Risks and benefits of both were discussed and patient voiced understanding.  Pt elects WUJ:WJXBfor:IFOB Breast cancer screening- refer for mammogram.  DXA pending.  Advance directive- husband designated if incapacitated.   Cognitive function addressed- see scanned forms- and if abnormal then additional documentation follows.  Diet and exercise d/w pt.  Gradual intentional weight loss.   Hypertension:    Using medication without problems or lightheadedness: yes Chest pain with exertion:no Edema:no Short of breath:no  Elevated Cholesterol: Using medications without problems:yes Muscle aches: at baseline, not worse on med Diet compliance:yes Exercise:yes  Hypothyroid.  No neck mass, no lumps in neck. No dysphagia.  TSH wnl.  Labs d/w pt.  Compliant with meds.    PMH and SH reviewed  Meds,  vitals, and allergies reviewed.   ROS: See HPI.  Otherwise negative.    GEN: nad, alert and oriented HEENT: mucous membranes moist NECK: supple w/o LA, no TMG CV: rrr. PULM: ctab, no inc wob ABD: soft, +bs EXT: no edema SKIN: no acute rash

## 2013-02-20 NOTE — Patient Instructions (Addendum)
Check with your insurance to see if they will cover the shingles and tetanus shots. Kelsey Dunn will call about your referral for the mammogram and the DXA.   Go to the lab on the way out.  We'll contact you with your lab report.

## 2013-02-21 DIAGNOSIS — Z0000 Encounter for general adult medical examination without abnormal findings: Secondary | ICD-10-CM | POA: Insufficient documentation

## 2013-02-21 NOTE — Assessment & Plan Note (Signed)
Reasonable control, continue current meds.

## 2013-02-21 NOTE — Assessment & Plan Note (Signed)
Controlled, continue current meds.   

## 2013-02-21 NOTE — Assessment & Plan Note (Signed)
See scanned forms.  Routine anticipatory guidance given to patient.  See health maintenance. Flu 2014 Shingles d/w pt.   PNA 2010 Tetanus d/w pt. Done in 1999 D/w patient UJ:[id number]:options for colon cancer screening, including IFOB vs. colonoscopy.  Risks and benefits of both were discussed and patient voiced understanding.  Pt elects WGN:FAOZfor:IFOB Breast cancer screening- refer for mammogram.  DXA pending.  Advance directive- husband designated if incapacitated.   Cognitive function addressed- see scanned forms- and if abnormal then additional documentation follows.  Diet and exercise d/w pt.  Gradual intentional weight loss.

## 2013-02-22 ENCOUNTER — Encounter: Payer: Self-pay | Admitting: *Deleted

## 2013-02-22 ENCOUNTER — Telehealth: Payer: Self-pay | Admitting: Family Medicine

## 2013-02-22 NOTE — Telephone Encounter (Signed)
Relevant patient education assigned to patient using Emmi. ° °

## 2013-02-23 ENCOUNTER — Other Ambulatory Visit (INDEPENDENT_AMBULATORY_CARE_PROVIDER_SITE_OTHER): Payer: Medicare HMO

## 2013-02-23 DIAGNOSIS — Z1211 Encounter for screening for malignant neoplasm of colon: Secondary | ICD-10-CM

## 2013-02-23 LAB — FECAL OCCULT BLOOD, IMMUNOCHEMICAL: FECAL OCCULT BLD: NEGATIVE

## 2013-02-24 ENCOUNTER — Encounter: Payer: Self-pay | Admitting: Internal Medicine

## 2013-02-27 ENCOUNTER — Ambulatory Visit: Payer: Medicare HMO | Admitting: Family Medicine

## 2013-02-27 ENCOUNTER — Other Ambulatory Visit: Payer: Self-pay | Admitting: Family Medicine

## 2013-02-27 ENCOUNTER — Observation Stay (HOSPITAL_COMMUNITY)
Admission: EM | Admit: 2013-02-27 | Discharge: 2013-02-28 | Disposition: A | Discharge status: Home / Self Care | Payer: Medicare HMO | Attending: Cardiovascular Disease | Admitting: Cardiovascular Disease

## 2013-02-27 ENCOUNTER — Emergency Department (HOSPITAL_COMMUNITY): Payer: Medicare HMO

## 2013-02-27 ENCOUNTER — Encounter (HOSPITAL_COMMUNITY): Payer: Self-pay | Admitting: Emergency Medicine

## 2013-02-27 DIAGNOSIS — I651 Occlusion and stenosis of basilar artery: Secondary | ICD-10-CM | POA: Insufficient documentation

## 2013-02-27 DIAGNOSIS — Z888 Allergy status to other drugs, medicaments and biological substances status: Secondary | ICD-10-CM | POA: Insufficient documentation

## 2013-02-27 DIAGNOSIS — E039 Hypothyroidism, unspecified: Secondary | ICD-10-CM | POA: Insufficient documentation

## 2013-02-27 DIAGNOSIS — E785 Hyperlipidemia, unspecified: Secondary | ICD-10-CM | POA: Insufficient documentation

## 2013-02-27 DIAGNOSIS — I441 Atrioventricular block, second degree: Secondary | ICD-10-CM | POA: Diagnosis not present

## 2013-02-27 DIAGNOSIS — I2510 Atherosclerotic heart disease of native coronary artery without angina pectoris: Secondary | ICD-10-CM | POA: Diagnosis not present

## 2013-02-27 DIAGNOSIS — I10 Essential (primary) hypertension: Secondary | ICD-10-CM | POA: Diagnosis not present

## 2013-02-27 DIAGNOSIS — N3010 Interstitial cystitis (chronic) without hematuria: Secondary | ICD-10-CM | POA: Insufficient documentation

## 2013-02-27 DIAGNOSIS — IMO0001 Reserved for inherently not codable concepts without codable children: Secondary | ICD-10-CM | POA: Insufficient documentation

## 2013-02-27 DIAGNOSIS — Z8673 Personal history of transient ischemic attack (TIA), and cerebral infarction without residual deficits: Secondary | ICD-10-CM | POA: Insufficient documentation

## 2013-02-27 DIAGNOSIS — N3289 Other specified disorders of bladder: Secondary | ICD-10-CM | POA: Insufficient documentation

## 2013-02-27 DIAGNOSIS — R42 Dizziness and giddiness: Secondary | ICD-10-CM | POA: Insufficient documentation

## 2013-02-27 DIAGNOSIS — Z7902 Long term (current) use of antithrombotics/antiplatelets: Secondary | ICD-10-CM | POA: Insufficient documentation

## 2013-02-27 DIAGNOSIS — E7800 Pure hypercholesterolemia, unspecified: Secondary | ICD-10-CM | POA: Diagnosis present

## 2013-02-27 DIAGNOSIS — E669 Obesity, unspecified: Secondary | ICD-10-CM | POA: Insufficient documentation

## 2013-02-27 DIAGNOSIS — R079 Chest pain, unspecified: Secondary | ICD-10-CM

## 2013-02-27 DIAGNOSIS — Z79899 Other long term (current) drug therapy: Secondary | ICD-10-CM | POA: Insufficient documentation

## 2013-02-27 DIAGNOSIS — Z950 Presence of cardiac pacemaker: Secondary | ICD-10-CM | POA: Insufficient documentation

## 2013-02-27 DIAGNOSIS — R0789 Other chest pain: Principal | ICD-10-CM | POA: Insufficient documentation

## 2013-02-27 DIAGNOSIS — I200 Unstable angina: Secondary | ICD-10-CM | POA: Diagnosis present

## 2013-02-27 HISTORY — DX: Unspecified osteoarthritis, unspecified site: M19.90

## 2013-02-27 HISTORY — DX: Malignant hyperthermia due to anesthesia, initial encounter: T88.3XXA

## 2013-02-27 HISTORY — DX: Family history of other specified conditions: Z84.89

## 2013-02-27 LAB — CBC
HCT: 44.5 % (ref 36.0–46.0)
Hemoglobin: 15.6 g/dL — ABNORMAL HIGH (ref 12.0–15.0)
MCH: 30.6 pg (ref 26.0–34.0)
MCHC: 35.1 g/dL (ref 30.0–36.0)
MCV: 87.3 fL (ref 78.0–100.0)
PLATELETS: 224 10*3/uL (ref 150–400)
RBC: 5.1 MIL/uL (ref 3.87–5.11)
RDW: 13.1 % (ref 11.5–15.5)
WBC: 8.1 10*3/uL (ref 4.0–10.5)

## 2013-02-27 LAB — BASIC METABOLIC PANEL
BUN: 15 mg/dL (ref 6–23)
CALCIUM: 9.8 mg/dL (ref 8.4–10.5)
CO2: 27 meq/L (ref 19–32)
Chloride: 105 mEq/L (ref 96–112)
Creatinine, Ser: 0.85 mg/dL (ref 0.50–1.10)
GFR calc Af Amer: 79 mL/min — ABNORMAL LOW (ref >90)
GFR calc non Af Amer: 68 mL/min — ABNORMAL LOW (ref >90)
Glucose, Bld: 119 mg/dL — ABNORMAL HIGH (ref 70–99)
Potassium: 3.5 mEq/L — ABNORMAL LOW (ref 3.7–5.3)
SODIUM: 146 meq/L (ref 137–147)

## 2013-02-27 LAB — PROTIME-INR
INR: 1.08 (ref 0.00–1.49)
Prothrombin Time: 13.8 seconds (ref 11.6–15.2)

## 2013-02-27 LAB — TROPONIN I: Troponin I: <0.3 ng/mL (ref <0.30)

## 2013-02-27 LAB — MAGNESIUM: Magnesium: 2.2 mg/dL (ref 1.5–2.5)

## 2013-02-27 LAB — I-STAT TROPONIN, ED: TROPONIN I, POC: 0 ng/mL (ref 0.00–0.08)

## 2013-02-27 MED ORDER — LOSARTAN POTASSIUM 50 MG PO TABS
100.0000 mg | ORAL_TABLET | Freq: Every day | ORAL | Status: DC
  Administered 2013-02-28: 100 mg via ORAL
  Filled 2013-02-27: qty 2

## 2013-02-27 MED ORDER — POTASSIUM CHLORIDE CRYS ER 20 MEQ PO TBCR
40.0000 meq | EXTENDED_RELEASE_TABLET | Freq: Two times a day (BID) | ORAL | Status: DC
  Administered 2013-02-27 – 2013-02-28 (×2): 40 meq via ORAL
  Filled 2013-02-27 (×3): qty 2

## 2013-02-27 MED ORDER — SODIUM CHLORIDE 0.9 % IJ SOLN
3.0000 mL | Freq: Two times a day (BID) | INTRAMUSCULAR | Status: DC
  Administered 2013-02-27 – 2013-02-28 (×2): 3 mL via INTRAVENOUS

## 2013-02-27 MED ORDER — NITROGLYCERIN 0.4 MG SL SUBL: 0.4000 mg | SUBLINGUAL_TABLET | SUBLINGUAL | Status: DC | PRN

## 2013-02-27 MED ORDER — ASPIRIN 81 MG PO CHEW
324.0000 mg | CHEWABLE_TABLET | ORAL | Status: AC
  Administered 2013-02-27: 324 mg via ORAL
  Filled 2013-02-27: qty 4

## 2013-02-27 MED ORDER — LOSARTAN POTASSIUM-HCTZ 100-25 MG PO TABS: 1.0000 | ORAL_TABLET | Freq: Every day | ORAL | Status: DC

## 2013-02-27 MED ORDER — LEVOTHYROXINE SODIUM 100 MCG PO TABS
100.0000 ug | ORAL_TABLET | Freq: Every day | ORAL | Status: DC
  Administered 2013-02-28: 100 ug via ORAL
  Filled 2013-02-27 (×2): qty 1

## 2013-02-27 MED ORDER — REGADENOSON 0.4 MG/5ML IV SOLN
0.4000 mg | Freq: Once | INTRAVENOUS | Status: AC
  Administered 2013-02-28: 0.4 mg via INTRAVENOUS
  Filled 2013-02-27: qty 5

## 2013-02-27 MED ORDER — HYDROCHLOROTHIAZIDE 25 MG PO TABS
25.0000 mg | ORAL_TABLET | Freq: Every day | ORAL | Status: DC
  Administered 2013-02-28: 25 mg via ORAL
  Filled 2013-02-27: qty 1

## 2013-02-27 MED ORDER — FISH OIL BURP-LESS 500 MG PO CAPS: 1000.0000 mg | ORAL_CAPSULE | Freq: Two times a day (BID) | ORAL | Status: DC

## 2013-02-27 MED ORDER — ENOXAPARIN SODIUM 40 MG/0.4ML ~~LOC~~ SOLN
40.0000 mg | SUBCUTANEOUS | Status: DC
  Administered 2013-02-27: 40 mg via SUBCUTANEOUS
  Filled 2013-02-27 (×2): qty 0.4

## 2013-02-27 MED ORDER — SODIUM CHLORIDE 0.9 % IJ SOLN
3.0000 mL | INTRAMUSCULAR | Status: DC | PRN
Start: 2013-02-27 — End: 2013-02-28

## 2013-02-27 MED ORDER — PANTOPRAZOLE SODIUM 20 MG PO TBEC
20.0000 mg | DELAYED_RELEASE_TABLET | Freq: Every day | ORAL | Status: DC
  Administered 2013-02-28: 20 mg via ORAL
  Filled 2013-02-27: qty 1

## 2013-02-27 MED ORDER — CLOPIDOGREL BISULFATE 75 MG PO TABS
75.0000 mg | ORAL_TABLET | Freq: Every day | ORAL | Status: DC
  Administered 2013-02-28: 75 mg via ORAL
  Filled 2013-02-27: qty 1

## 2013-02-27 MED ORDER — AMLODIPINE BESYLATE 5 MG PO TABS
5.0000 mg | ORAL_TABLET | Freq: Every day | ORAL | Status: DC
  Administered 2013-02-28: 5 mg via ORAL
  Filled 2013-02-27: qty 1

## 2013-02-27 MED ORDER — OMEGA-3-ACID ETHYL ESTERS 1 G PO CAPS
1.0000 g | ORAL_CAPSULE | Freq: Two times a day (BID) | ORAL | Status: DC
  Administered 2013-02-28: 1 g via ORAL
  Filled 2013-02-27 (×2): qty 1

## 2013-02-27 MED ORDER — SIMVASTATIN 10 MG PO TABS
10.0000 mg | ORAL_TABLET | Freq: Every day | ORAL | Status: DC
  Filled 2013-02-27: qty 1

## 2013-02-27 MED ORDER — ASPIRIN EC 81 MG PO TBEC
81.0000 mg | DELAYED_RELEASE_TABLET | Freq: Every day | ORAL | Status: DC
  Administered 2013-02-28: 81 mg via ORAL
  Filled 2013-02-27: qty 1

## 2013-02-27 MED ORDER — SODIUM CHLORIDE 0.9 % IV SOLN: 250.0000 mL | INTRAVENOUS | Status: DC | PRN

## 2013-02-27 MED ORDER — ASPIRIN 300 MG RE SUPP
300.0000 mg | RECTAL | Status: AC
  Filled 2013-02-27: qty 1

## 2013-02-27 MED ORDER — FUROSEMIDE 40 MG PO TABS
40.0000 mg | ORAL_TABLET | Freq: Every day | ORAL | Status: DC | PRN
  Filled 2013-02-27: qty 1

## 2013-02-27 NOTE — ED Notes (Signed)
Attempted report floor unable to take

## 2013-02-27 NOTE — ED Provider Notes (Signed)
CSN: [id number]     Arrival date & time 02/27/13  1104 History   First MD Initiated Contact with Patient 02/27/13 1116     Chief Complaint  Patient presents with  . Dizziness  . Chest Pain     (Consider location/radiation/quality/duration/timing/severity/associated sxs/prior Treatment) Patient is a 73 y.o. female presenting with dizziness and chest pain. The history is provided by the patient (the pt states she has had some chest pain and dizzines today).  Dizziness Quality:  Lightheadedness Severity:  Moderate Onset quality:  Sudden Timing:  Intermittent Progression:  Waxing and waning Chronicity:  Recurrent Associated symptoms: chest pain   Associated symptoms: no diarrhea and no headaches   Chest Pain Associated symptoms: dizziness   Associated symptoms: no abdominal pain, no back pain, no cough, no fatigue and no headache     Past Medical History  Diagnosis Date  . Hypothyroidism 1990  . Labile hypertension 08/1991  . HLD (hyperlipidemia) 01/1997  . Fibromyalgia   . Bladder spasms   . Basilar artery stenosis     90%  . CAD (coronary artery disease)     a) 06/2009 cath: severe stenosis RCA with placement drug eluting stent  b) 08/2011 cath: normal left main, luminal LAD irregularities, luminal LCx irregularities, luminal RCA irregularities and no ISR to mid-RCA DES, LVEF 60-65%  . IC (interstitial cystitis)   . Obesity   . Hypertension   . Blood transfusion   . CVA (cerebral infarction) December 2012  . Pacemaker 11/11/2011    dual chamber pacemaker  . Second degree Mobitz II AV block    Past Surgical History  Procedure Laterality Date  . Bladder suspension  1974  . Total abdominal hysterectomy  1974    dysmennorhea  . Cholecystectomy  1985  . Cardiac catheterization  1993    normal  . Renal angioplasty  1993    normal  . Carotid US  11/93    Occl L comm carotid  . Visual evoked response  11/93; 1/01    normal  . US renal/aorta  12/93    Normal  . Emg   1/01    LE, RUE normal  . Carotid US  4/98    L comm occl R-ok  . Carotid US  01/30/03    L occl  R less 40%  . Dexa  10/15/04    Troch -0.4 o/w pos  . Carotid arteriogram      bilat  . Bilateral vert & subclavian angiogram  07/19/08    85-90% stenosis mid basilar art; occluded left vert artery; occluded left comm carotid artery w/ collat flow  . Basilar artery angioplasty  09/04/08    sm right brachial hematoma  . Cath single vess dz  06/13/09    70% stenosis RCA EF 65-70%  . Coronary angioplasty with stent placement  06/19/09  . Cataract extraction w/ intraocular lens implant  2007-2008  . Eye surgery    . Pacemaker insertion  11/11/11    MDT Adapta L implanted by Dr Johney Frame   Family History  Problem Relation Age of Onset  . Lung cancer Father   . Heart attack Mother   . Lymphoma Mother     s/p chemo  . Stroke Mother     x 2  . Diabetes Brother   . Colon cancer Neg Hx   . Breast cancer Maternal Aunt    History  Substance Use Topics  . Smoking status: Never Smoker   . Smokeless tobacco:  Never Used  . Alcohol Use: No   OB History   Grav Para Term Preterm Abortions TAB SAB Ect Mult Living                 Review of Systems  Constitutional: Negative for appetite change and fatigue.  HENT: Negative for congestion, ear discharge and sinus pressure.   Eyes: Negative for discharge.  Respiratory: Negative for cough.   Cardiovascular: Positive for chest pain.  Gastrointestinal: Negative for abdominal pain and diarrhea.  Genitourinary: Negative for frequency and hematuria.  Musculoskeletal: Negative for back pain.  Skin: Negative for rash.  Neurological: Positive for dizziness. Negative for seizures and headaches.  Psychiatric/Behavioral: Negative for hallucinations.      Allergies  Anesthetics, amide; Codeine; Nitrofurantoin; Orphenadrine citrate; and Pentosan polysulfate sodium  Home Medications   Current Outpatient Rx  Name  Route  Sig  Dispense  Refill  .  amLODipine (NORVASC) 5 MG tablet   Oral   Take 5 mg by mouth daily.         . clopidogrel (PLAVIX) 75 MG tablet   Oral   Take 75 mg by mouth daily with breakfast.         . Coenzyme Q10 200 MG capsule   Oral   Take 200 mg by mouth 2 (two) times daily.         . furosemide (LASIX) 40 MG tablet   Oral   Take 40 mg by mouth daily as needed for edema.         . lansoprazole (PREVACID) 15 MG capsule   Oral   Take 15 mg by mouth as needed (reflux).          Marland Kitchen levothyroxine (SYNTHROID, LEVOTHROID) 100 MCG tablet   Oral   Take 100 mcg by mouth daily.         Marland Kitchen losartan-hydrochlorothiazide (HYZAAR) 100-25 MG per tablet   Oral   Take 1 tablet by mouth daily.         . Omega-3 Fatty Acids (FISH OIL BURP-LESS) 500 MG CAPS   Oral   Take 1,000 mg by mouth 2 (two) times daily.          . simvastatin (ZOCOR) 10 MG tablet   Oral   Take 10 mg by mouth daily at 6 PM.         . nitroGLYCERIN (NITROSTAT) 0.4 MG SL tablet   Sublingual   Place 0.4 mg under the tongue every 5 (five) minutes as needed. For chest pain          BP 152/117  Pulse 78  Temp(Src) 98 F (36.7 C) (Oral)  Resp 23  SpO2 95% Physical Exam  Constitutional: She is oriented to person, place, and time. She appears well-developed.  HENT:  Head: Normocephalic.  Eyes: Conjunctivae and EOM are normal. No scleral icterus.  Neck: Neck supple. No thyromegaly present.  Cardiovascular: Normal rate and regular rhythm.  Exam reveals no gallop and no friction rub.   No murmur heard. Pulmonary/Chest: No stridor. She has no wheezes. She has no rales. She exhibits no tenderness.  Abdominal: She exhibits no distension. There is no tenderness. There is no rebound.  Musculoskeletal: Normal range of motion. She exhibits no edema.  Lymphadenopathy:    She has no cervical adenopathy.  Neurological: She is oriented to person, place, and time. She exhibits normal muscle tone. Coordination normal.  Skin: No rash  noted. No erythema.  Psychiatric: She has a normal mood and  affect. Her behavior is normal.    ED Course  Procedures (including critical care time) Labs Review Labs Reviewed  CBC - Abnormal; Notable for the following:    Hemoglobin 15.6 (*)    All other components within normal limits  BASIC METABOLIC PANEL - Abnormal; Notable for the following:    Potassium 3.5 (*)    Glucose, Bld 119 (*)    GFR calc non Af Amer 68 (*)    GFR calc Af Amer 79 (*)    All other components within normal limits  Rosezena SensorI-STAT TROPOININ, ED   Imaging Review Dg Chest Port 1 View  02/27/2013   CLINICAL DATA:  Dizziness.  Chest pain.  EXAM: PORTABLE CHEST - 1 VIEW  COMPARISON:  11/12/2011.  FINDINGS: Sequential pacemaker is in place. Leads appear to be in a relatively similar position to the prior exam. No obvious interruption. This can be assessed on followup two view exam when patient is able.  Heart size within normal limits.  Central pulmonary vascular prominence. No infiltrate, congestive heart failure or pneumothorax.  IMPRESSION: No acute abnormality.  Please see above.   Electronically Signed   By: Bridgett LarssonSteve  Olson M.D.   On: 02/27/2013 12:15    EKG Interpretation    Date/Time:  Monday February 27 2013 11:15:03 EST Ventricular Rate:  86 PR Interval:  172 QRS Duration: 132 QT Interval:  405 QTC Calculation: 484 R Axis:   58 Text Interpretation:  Sinus rhythm Right bundle branch block Confirmed by Arthurine Oleary  MD, Lj Miyamoto ([id number]) on 02/27/2013 1:22:42 PM            MDM   Final diagnoses:  None       Benny LennertJoseph L Kara Mierzejewski, MD 02/27/13 [id number]

## 2013-02-27 NOTE — ED Notes (Signed)
Pt arrives from home by ems for evaluation of lightheadedness and chest tightness, EMS reports that pt was cleaning the bathroom with cleaner when she started feeling lightheaded. EMS states pt denied any CP, reports some chest tightness with her pacemaker. Pt also reports feeling lightheaded and dizzy this morning while walking on the treadmill, the lightheadedness subsided and then returned while cleaning the bathroom. EMS reports unremarkable EKG, hx of RBB, 100% Left Carotid artery blockage, 30% blockage in basal artery. Alert and oriented, nad noted.

## 2013-02-27 NOTE — ED Notes (Signed)
Cardiologist at bedside.  

## 2013-02-27 NOTE — H&P (Signed)
Patient ID: JURLINE FOLGER MRN: [id number], DOB/AGE: 09/06/1940   Admit date: 02/27/2013   Primary Physician: Crawford Givens, MD Primary Cardiologist: Dr. Sanjuana Kava Dr. Johney Frame  Pt. Profile: Kelsey Dunn is a 73 y.o. female with a history of HLD, labile HTN, CAD s/p 2011 DES to RCA, obesity, CVA (2012), basilar artery stenosis, symptomatic AV block s/p pacemaker placement, and hypothyroidism who presented to [facility] today complaining of chest tightness and lightheadedness. Today she was walking on her treadmill, which she does everyday, when she had an episode of lightheadedness and chest tightness. This has happened before on the treadmill so Dr. Sanjuana Kava advised her to cut back on her time walking. She did get SOB today to the point that she borrowed her husbands home O2. This resolved so she tried to keep busy by cleaning the bathroom. During this time she had another episode associated diaphoresis and SOB. She denies abdominal pain, n/v, LE swelling, orthopnea or PND. She does notice the symptoms are worse with exertion and better at rest. More than anything she just "does not feel right" and feels very fatigued.   She had a cardiac cath and PCI with DES to RCA in 2011. She was admitted to [facility] 08/10/11 with dizziness, chest pain and repeat cath showed stable disease. She was observed to have second degree AV block demonstrated with RBBB and her coreg was discontinued. GXT was performed in 09/01/11. She was observed during the study to have a LBBB with exertion. She developed exertional symptoms of fatigue, SOB, and CP which corresponded to her LBBB. She was seen by Dr. Johney Frame and continued to have episodes of palpitations and dizziness with moderate activity. A permanent pacemaker was placed on 11/11/11 by Dr. Johney Frame. Last EP visit 11/14/12 and her pacemaker was functioning well. She has been undergoing cerebral angiograms for basilar artery stenosis and has had angiplasty of the basilar  artery in August 2010. She is known to have an occluded left common carotid artery. She was admitted to [facility] December 2012 with a CVA. She is being followed by Neurology.     Problem List  Past Medical History  Diagnosis Date  . Hypothyroidism 1990  . Labile hypertension 08/1991  . HLD (hyperlipidemia) 01/1997  . Fibromyalgia   . Bladder spasms   . Basilar artery stenosis     90%  . CAD (coronary artery disease)     a) 06/2009 cath: severe stenosis RCA with placement drug eluting stent  b) 08/2011 cath: normal left main, luminal LAD irregularities, luminal LCx irregularities, luminal RCA irregularities and no ISR to mid-RCA DES, LVEF 60-65%  . IC (interstitial cystitis)   . Obesity   . Hypertension   . Blood transfusion   . CVA (cerebral infarction) December 2012  . Pacemaker 11/11/2011    dual chamber pacemaker  . Second degree Mobitz II AV block     Past Surgical History  Procedure Laterality Date  . Bladder suspension  1974  . Total abdominal hysterectomy  1974    dysmennorhea  . Cholecystectomy  1985  . Cardiac catheterization  1993    normal  . Renal angioplasty  1993    normal  . Carotid US  11/93    Occl L comm carotid  . Visual evoked response  11/93; 1/01    normal  . US renal/aorta  12/93    Normal  . Emg  1/01    LE, RUE normal  . Carotid US  4/98    L comm occl R-ok  . Carotid us  01/30/03    L occl  R less 40%  . Dexa  10/15/04    Troch -0.4 o/w pos  . Carotid arteriogram      bilat  . Bilateral vert & subclavian angiogram  07/19/08    85-90% stenosis mid basilar art; occluded left vert artery; occluded left comm carotid artery w/ collat flow  . Basilar artery angioplasty  09/04/08    sm right brachial hematoma  . Cath single vess dz  06/13/09    70% stenosis RCA EF 65-70%  . Coronary angioplasty with stent placement  06/19/09  . Cataract extraction w/ intraocular lens implant  2007-2008  . Eye surgery    . Pacemaker insertion  11/11/11    MDT  Adapta L implanted by Dr Johney FrameAllred     Allergies  Allergies  Allergen Reactions  . Anesthetics, Amide     REACTION:FEVER, SYNCOPE  . Codeine Nausea And Vomiting    Reaction:Hallucinations  . Nitrofurantoin Nausea Only    REACTION: Hallucinations  . Orphenadrine Citrate Nausea And Vomiting    REACTION: Hallucination  . Pentosan Polysulfate Sodium     REACTION: rash/ severe headache    Home Medications  Prior to Admission medications   Medication Sig Start Date End Date Taking? Authorizing Provider  amLODipine (NORVASC) 5 MG tablet Take 5 mg by mouth daily.   Yes Historical Provider, MD  clopidogrel (PLAVIX) 75 MG tablet Take 75 mg by mouth daily with breakfast.   Yes Historical Provider, MD  Coenzyme Q10 200 MG capsule Take 200 mg by mouth 2 (two) times daily.   Yes Historical Provider, MD  furosemide (LASIX) 40 MG tablet Take 40 mg by mouth daily as needed for edema.   Yes Historical Provider, MD  lansoprazole (PREVACID) 15 MG capsule Take 15 mg by mouth as needed (reflux).    Yes Historical Provider, MD  levothyroxine (SYNTHROID, LEVOTHROID) 100 MCG tablet Take 100 mcg by mouth daily.   Yes Historical Provider, MD  losartan-hydrochlorothiazide (HYZAAR) 100-25 MG per tablet Take 1 tablet by mouth daily.   Yes Historical Provider, MD  Omega-3 Fatty Acids (FISH OIL BURP-LESS) 500 MG CAPS Take 1,000 mg by mouth 2 (two) times daily.    Yes Historical Provider, MD  simvastatin (ZOCOR) 10 MG tablet Take 10 mg by mouth daily at 6 PM.   Yes Historical Provider, MD  nitroGLYCERIN (NITROSTAT) 0.4 MG SL tablet Place 0.4 mg under the tongue every 5 (five) minutes as needed. For chest pain    Historical Provider, MD    Family History  Family History  Problem Relation Age of Onset  . Lung cancer Father   . Heart attack Mother   . Lymphoma Mother     s/p chemo  . Stroke Mother     x 2  . Diabetes Brother   . Colon cancer Neg Hx   . Breast cancer Maternal Aunt     Social  History  History   Social History  . Marital Status: Married    Spouse Name: N/A    Number of Children: 1  . Years of Education: N/A   Occupational History  . Bookkeeper-part time    Social History Main Topics  . Smoking status: Never Smoker   . Smokeless tobacco: Never Used  . Alcohol Use: No  . Drug Use: No  . Sexual Activity: No   Other Topics Concern  . 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  Physical Exam  Blood pressure 152/117, pulse 78, temperature 98 F (36.7 C), temperature source Oral, resp. rate 23, SpO2 95.00%.  General: Pleasant, NAD Psych: Normal affect. Neuro: Alert and oriented X 3. Moves all extremities spontaneously. HEENT: Normal  Neck: Supple without bruits or JVD. Lungs:  Resp regular and unlabored, CTA. Heart: RRR no s3, s4, or murmurs. Abdomen: Soft, non-tender, non-distended, BS + x 4.  Extremities: No clubbing, cyanosis or edema. DP/PT/Radials 2+ and equal bilaterally.  Labs  No results found for this basename: CKTOTAL, CKMB, TROPONINI,  in the last 72 hours Lab Results  Component Value Date   WBC 8.1 02/27/2013   HGB 15.6* 02/27/2013   HCT 44.5 02/27/2013   MCV 87.3 02/27/2013   PLT 224 02/27/2013     Recent Labs Lab 02/27/13 1120  NA 146  K 3.5*  CL 105  CO2 27  BUN 15  CREATININE 0.85  CALCIUM 9.8  GLUCOSE 119*   Lab Results  Component Value Date   CHOL 159 02/13/2013   HDL 42.70 02/13/2013   LDLCALC 85 02/13/2013   TRIG 159.0* 02/13/2013     Radiology/Studies  Dg Chest Port 1 View  02/27/2013   CLINICAL DATA:  Dizziness.  Chest pain.  EXAM: PORTABLE CHEST - 1 VIEW  COMPARISON:  11/12/2011.  FINDINGS: Sequential pacemaker is in place. Leads appear to be in a relatively similar position to the prior exam. No obvious interruption. This  can be assessed on followup two view exam when patient is able.  Heart size within normal limits.  Central pulmonary vascular prominence. No infiltrate, congestive heart failure or pneumothorax.  IMPRESSION: No acute abnormality.    2D ECHO: 12/21/2010 ----------------------------------------------------------- LV EF: 60% - 65% ----------------------------------------------------------- Study Conclusions - Left ventricle: The cavity size was normal. There was mild focal basal hypertrophy of the septum. Systolic function was normal. The estimated ejection fraction was in the range of 60% to 65%. Wall motion was normal; there were no regional wall motion abnormalities. Doppler parameters are consistent with abnormal left ventricular relaxation (grade 1 diastolic dysfunction). - Aortic valve: Trileaflet; moderately calcified leaflets. Sclerosis without stenosis. - Mitral valve: Mildly calcified annulus. No significant regurgitation. - Left atrium: The atrium was mildly dilated. - Right ventricle: The cavity size was normal. Systolic function was normal. - Inferior vena cava: The vessel was normal in size; the respirophasic diameter changes were in the normal range (= 50%); findings are consistent with normal central venous pressure. Impressions: - Normal LV size with mild focal basal septal hypertrophy. EF 60-65%. Normal RV size and systolic function. Aortic sclerosis without stenosis. Transthoracic echocardiography. M-mode, complete 2D, spectral Doppler, and color Doppler. Height: Height: 157.5cm. Height: 62in. Weight: Weight: 96.2kg. Weight: 211.6lb. Body mass index: BMI: 38.8kg/m^2. Body surface area: BSA: 1.13m^2. Blood pressure: 161/82. Patient status: Inpatient. Location: Bedside.   Cardiac Catheterization Procedure Note 08/2011 DOB: 11/12/1940  Procedure: Left Heart Cath, Selective Coronary Angiography, LV angiography  Indication: Chest pain, heart block.  Procedural  details: The right groin was prepped, draped, and anesthetized with 1% lidocaine. Using modified Seldinger technique, a 5 French sheath was introduced into the right femoral artery. Standard Judkins catheters were used for coronary angiography and left ventriculography. Catheter exchanges were performed over a  guidewire. There were no immediate procedural complications. The patient was transferred to the post catheterization recovery area for further monitoring.  Procedural Findings:  Hemodynamics:  AO 140/77  LV 153/14  Coronary angiography:  Coronary dominance: right  Left mainstem: No significant disease.  Left anterior descending (LAD): Luminal irregularities.  Left circumflex (LCx): There is a large ramus. Luminal irregularities in the LCx system.  Right coronary artery (RCA): There is a mid-RCA stent with no significant in-stent restenosis. Luminal irregularities in the RCA.  Left ventriculography: Left ventricular systolic function is normal, LVEF is estimated at 60-65%, there is no significant mitral regurgitation  Final Conclusions: No obstructive CAD. Patent RCA stent. No explanation for chest pain. She has intermittent probably type I 2nd degree AV block and transient complete heart block was seen in the ER. We are going to stop Coreg and follow telemetry in the CCU.    ECG  NSR RBBB, no change from previous  ASSESSMENT AND PLAN  BRAELYNNE GARINGER is a 73 y.o. female with a history of HLD, labile HTN, CAD s/p 2001 DES to RCA, obesity, CVA (2012), basilar artery stenosis, symptomatic AV block s/p pacemaker placement, and hypothyroidism who presented to [facility] today complaining of chest tightness and lightheadedness.  Chest pain- patient with known CAD, exertional chest pain and lightheadedness. --Troponin x1 negative, ECG with no acute ST or TW changes --Will admit to telemtry for observation --Cycle Troponin and serial ECGs -- Will make NPO after midnight and schedule a Lexiscan  myovue in the AM.   Hypokalemia- mild. 3.5 on the low end of normal. Will give one dose of K-Dur   HTN- continue Norvasc and Hyzaar   HLD- continue simvastatin  Basilar artery stenosis- continue asa/plavix  Hypothyroidism- continue levothyroxine   Signed, Thereasa Parkin, PA-C 02/27/2013, 3:40 PM  Pager [phone]  I have personally seen and examined this patient with Deborha Payment, PA-C. I agree with the assessment and plan as outlined above. She has known CAD with prior RCA stent. Her symptoms are non-specific. Some chest pain and dyspnea. Cardiac markers are negative. Does not appear to be ACS. Will admit to telemetry. Cycle cardiac markers and plan Lexiscan myoview in am if cardiac markers are negative.   Windel Keziah 02/27/2013 5:15 PM

## 2013-02-28 ENCOUNTER — Observation Stay (HOSPITAL_COMMUNITY): Payer: Medicare HMO

## 2013-02-28 ENCOUNTER — Encounter (HOSPITAL_COMMUNITY): Payer: Self-pay | Admitting: Nurse Practitioner

## 2013-02-28 DIAGNOSIS — R079 Chest pain, unspecified: Secondary | ICD-10-CM | POA: Diagnosis not present

## 2013-02-28 LAB — COMPREHENSIVE METABOLIC PANEL
ALT: 17 U/L (ref 0–35)
AST: 18 U/L (ref 0–37)
Albumin: 3.3 g/dL — ABNORMAL LOW (ref 3.5–5.2)
Alkaline Phosphatase: 58 U/L (ref 39–117)
BILIRUBIN TOTAL: 0.6 mg/dL (ref 0.3–1.2)
BUN: 15 mg/dL (ref 6–23)
CALCIUM: 9.1 mg/dL (ref 8.4–10.5)
CHLORIDE: 106 meq/L (ref 96–112)
CO2: 27 meq/L (ref 19–32)
Creatinine, Ser: 0.83 mg/dL (ref 0.50–1.10)
GFR calc Af Amer: 81 mL/min — ABNORMAL LOW (ref >90)
GFR calc non Af Amer: 70 mL/min — ABNORMAL LOW (ref >90)
Glucose, Bld: 105 mg/dL — ABNORMAL HIGH (ref 70–99)
POTASSIUM: 3.5 meq/L — AB (ref 3.7–5.3)
Sodium: 145 mEq/L (ref 137–147)
Total Protein: 6.2 g/dL (ref 6.0–8.3)

## 2013-02-28 LAB — TROPONIN I
Troponin I: <0.3 ng/mL (ref <0.30)
Troponin I: <0.3 ng/mL (ref <0.30)

## 2013-02-28 LAB — CBC
HCT: 39.5 % (ref 36.0–46.0)
Hemoglobin: 13.5 g/dL (ref 12.0–15.0)
MCH: 30 pg (ref 26.0–34.0)
MCHC: 34.2 g/dL (ref 30.0–36.0)
MCV: 87.8 fL (ref 78.0–100.0)
PLATELETS: 198 10*3/uL (ref 150–400)
RBC: 4.5 MIL/uL (ref 3.87–5.11)
RDW: 13.4 % (ref 11.5–15.5)
WBC: 5.3 10*3/uL (ref 4.0–10.5)

## 2013-02-28 LAB — HEMOGLOBIN A1C
Hgb A1c MFr Bld: 5.9 % — ABNORMAL HIGH (ref <5.7)
Mean Plasma Glucose: 123 mg/dL — ABNORMAL HIGH (ref <117)

## 2013-02-28 LAB — TSH: TSH: 0.91 u[IU]/mL (ref 0.350–4.500)

## 2013-02-28 LAB — LIPID PANEL
CHOLESTEROL: 132 mg/dL (ref 0–200)
HDL: 38 mg/dL — AB (ref >39)
LDL Cholesterol: 62 mg/dL (ref 0–99)
Total CHOL/HDL Ratio: 3.5 RATIO
Triglycerides: 162 mg/dL — ABNORMAL HIGH (ref <150)
VLDL: 32 mg/dL (ref 0–40)

## 2013-02-28 LAB — PRO B NATRIURETIC PEPTIDE: PRO B NATRI PEPTIDE: 95 pg/mL (ref 0–125)

## 2013-02-28 MED ORDER — REGADENOSON 0.4 MG/5ML IV SOLN
INTRAVENOUS | Status: AC
  Administered 2013-02-28: 0.4 mg via INTRAVENOUS
  Filled 2013-02-28: qty 5

## 2013-02-28 MED ORDER — POTASSIUM CHLORIDE CRYS ER 20 MEQ PO TBCR: 20.0000 meq | EXTENDED_RELEASE_TABLET | Freq: Two times a day (BID) | ORAL | Status: DC

## 2013-02-28 MED ORDER — ASPIRIN 81 MG PO TBEC
81.0000 mg | DELAYED_RELEASE_TABLET | Freq: Every day | ORAL | Status: DC
Start: 2013-02-28 — End: 2013-07-27

## 2013-02-28 MED ORDER — TECHNETIUM TC 99M SESTAMIBI GENERIC - CARDIOLITE
10.0000 | Freq: Once | INTRAVENOUS | Status: AC | PRN
  Administered 2013-02-28: 10 via INTRAVENOUS

## 2013-02-28 MED ORDER — TECHNETIUM TC 99M SESTAMIBI GENERIC - CARDIOLITE
30.0000 | Freq: Once | INTRAVENOUS | Status: AC | PRN
  Administered 2013-02-28: 30 via INTRAVENOUS

## 2013-02-28 NOTE — Progress Notes (Signed)
Patient Name: Kelsey Dunn Date of Encounter: 02/28/2013   Principal Problem:   Unstable angina Active Problems:   CAD, NATIVE VESSEL   Pure hypercholesterolemia   ESSENTIAL HYPERTENSION   FIBROMYALGIA   Pacemaker-Medtronc    SUBJECTIVE  No chest pain overnight.  For cardiolite this morning.  CURRENT MEDS . amLODipine  5 mg Oral Daily  . aspirin EC  81 mg Oral Daily  . clopidogrel  75 mg Oral Q breakfast  . enoxaparin (LOVENOX) injection  40 mg Subcutaneous Q24H  . hydrochlorothiazide  25 mg Oral Daily  . levothyroxine  100 mcg Oral QAC breakfast  . losartan  100 mg Oral Daily  . omega-3 acid ethyl esters  1 g Oral BID  . pantoprazole  20 mg Oral Daily  . potassium chloride  40 mEq Oral BID  . simvastatin  10 mg Oral q1800  . sodium chloride  3 mL Intravenous Q12H    OBJECTIVE  Filed Vitals:   02/27/13 1858 02/27/13 1957 02/28/13 0533 02/28/13 0900  BP: 171/69 121/50 108/66 178/70  Pulse: 73 76 66   Temp: 98 F (36.7 C) 98 F (36.7 C) 98 F (36.7 C)   TempSrc: Oral Oral Oral   Resp: 20 20 18    Weight:   207 lb (93.895 kg)   SpO2: 100% 95% 96%    No intake or output data in the 24 hours ending 02/28/13 0934 Filed Weights   02/28/13 0533  Weight: 207 lb (93.895 kg)    PHYSICAL EXAM  General: Pleasant, NAD. Neuro: Alert and oriented X 3. Moves all extremities spontaneously. Psych: Normal affect. HEENT:  Normal  Neck: Supple without bruits or JVD. Lungs:  Resp regular and unlabored, CTA. Heart: RRR no s3, s4, or murmurs. Abdomen: Soft, non-tender, non-distended, BS + x 4.  Extremities: No clubbing, cyanosis or edema. DP/PT/Radials 2+ and equal bilaterally.  Accessory Clinical Findings  CBC  Recent Labs  02/27/13 1120 02/28/13 0426  WBC 8.1 5.3  HGB 15.6* 13.5  HCT 44.5 39.5  MCV 87.3 87.8  PLT 224 198   Basic Metabolic Panel  Recent Labs  02/27/13 1120 02/27/13 2019 02/28/13 0426  NA 146  --  145  K 3.5*  --  3.5*  CL 105  --   106  CO2 27  --  27  GLUCOSE 119*  --  105*  BUN 15  --  15  CREATININE 0.85  --  0.83  CALCIUM 9.8  --  9.1  MG  --  2.2  --    Liver Function Tests  Recent Labs  02/28/13 0426  AST 18  ALT 17  ALKPHOS 58  BILITOT 0.6  PROT 6.2  ALBUMIN 3.3*   Cardiac Enzymes  Recent Labs  02/27/13 1725 02/27/13 2341 02/28/13 0426  TROPONINI <0.30 <0.30 <0.30   Hemoglobin A1C  Recent Labs  02/27/13 2019  HGBA1C 5.9*   Fasting Lipid Panel  Recent Labs  02/28/13 0426  CHOL 132  HDL 38*  LDLCALC 62  TRIG 409*  CHOLHDL 3.5   Thyroid Function Tests  Recent Labs  02/27/13 2019  TSH 0.910    TELE  Seen in nuc med.  ECG  Rsr, 61, rbbb.  Radiology/Studies  Dg Chest Port 1 View  02/27/2013   CLINICAL DATA:  Dizziness.  Chest pain.  EXAM: PORTABLE CHEST - 1 VIEW  COMPARISON:  11/12/2011.  FINDINGS: Sequential pacemaker is in place. Leads appear to be in a relatively similar  position to the prior exam. No obvious interruption. This can be assessed on followup two view exam when patient is able.  Heart size within normal limits.  Central pulmonary vascular prominence. No infiltrate, congestive heart failure or pneumothorax.  IMPRESSION: No acute abnormality.  Please see above.   Electronically Signed   By: Bridgett LarssonSteve  Olson M.D.   On: 02/27/2013 12:15    ASSESSMENT AND PLAN  1.  USA/CAD:  Neg CE.  No chest pain overnight.  For cardiolite this AM.  Cont asa, statin, plavix.  2.  HTN:  BP variable.  Cont current meds.  F/u after AM meds.  3. HL:  Cont statin.  Signed, Nicolasa Duckinghristopher Berge NP   History and all data above reviewed.  Patient examined.  I agree with the findings as above.  The patient exam reveals COR:RRR  ,  Lungs: Clear  ,  Abd: Positive bowel sounds, no rebound no guarding, Ext No edema   .  All available labs, radiology testing, previous records reviewed. Agree with documented assessment and plan.  Lexiscan Myoview without evidence for ischemia.  EF 78%.    Negative Lexiscan Myoview.  No further pain. OK to discharge.   Fayrene FearingJames Jihan Rudy  1:21 PM  02/28/2013'

## 2013-02-28 NOTE — Progress Notes (Signed)
UR completed 

## 2013-02-28 NOTE — Discharge Summary (Signed)
Discharge Summary   Patient ID: Kelsey Dunn,  MRN: [id number], DOB/AGE: 1940-09-09 73 y.o.  Admit date: 02/27/2013 Discharge date: 02/28/2013  Primary Care Provider: Crawford GivensGraham [name] Primary Cardiologist: J. Alanys Godino, MD   Discharge Diagnoses Principal Problem:   Unstable angina  **Non-ischemic stress testing this admission.  Active Problems:   CAD, NATIVE VESSEL   Pure hypercholesterolemia   ESSENTIAL HYPERTENSION   FIBROMYALGIA   Pacemaker-Medtronc  Allergies Allergies  Allergen Reactions  . Anesthetics, Amide     REACTION:FEVER, SYNCOPE  . Codeine Nausea And Vomiting    Reaction:Hallucinations  . Nitrofurantoin Nausea Only    REACTION: Hallucinations  . Orphenadrine Citrate Nausea And Vomiting    REACTION: Hallucination  . Pentosan Polysulfate Sodium     REACTION: rash/ severe headache   Procedures  Lexiscan Cardiolite 2.24.2015  IMPRESSION: No evidence for pharmacological induced ischemia.  Calculated ejection fraction is 79%. _____________   History of Present Illness  73 year old female with prior history of coronary artery disease status post drug-eluting stent placement to the right coronary artery and to Dr. Remus LofflerLevin, who also carries a history of hyperlipidemia, hypertension, obesity, symptomatic AV block status post pacemaker placement, history of CVA, and hypothyroidism.  She was in her usual state of health until the day of admission when she experienced substernal chest discomfort associated with lightheadedness while walking on her treadmill. This eventually resolved with rest but then she had recurrent symptoms associated with dyspnea while cleaning her bathroom. She presented to the [facility] on February 23, where ECG was nonacute and initial troponin was normal. She was admitted for further evaluation.  Hospital Course  Patient ruled out for myocardial infarction. She had no further chest pain. This morning, she underwent a Lexiscan Cardiolite, which  showed no evidence of ischemia and normal LV function with an EF of 79%. She has since been ambulating without difficulty and will be discharged home today in good condition.  Discharge Vitals Blood pressure 125/97, pulse 75, temperature 98.4 F (36.9 C), temperature source Oral, resp. rate 18, weight 207 lb (93.895 kg), SpO2 98.00%.  Filed Weights   02/28/13 0533  Weight: 207 lb (93.895 kg)    Labs  CBC  Recent Labs  02/27/13 1120 02/28/13 0426  WBC 8.1 5.3  HGB 15.6* 13.5  HCT 44.5 39.5  MCV 87.3 87.8  PLT 224 198   Basic Metabolic Panel  Recent Labs  02/27/13 1120 02/27/13 2019 02/28/13 0426  NA 146  --  145  K 3.5*  --  3.5*  CL 105  --  106  CO2 27  --  27  GLUCOSE 119*  --  105*  BUN 15  --  15  CREATININE 0.85  --  0.83  CALCIUM 9.8  --  9.1  MG  --  2.2  --    Liver Function Tests  Recent Labs  02/28/13 0426  AST 18  ALT 17  ALKPHOS 58  BILITOT 0.6  PROT 6.2  ALBUMIN 3.3*   Cardiac Enzymes  Recent Labs  02/27/13 1725 02/27/13 2341 02/28/13 0426  TROPONINI <0.30 <0.30 <0.30   Hemoglobin A1C  Recent Labs  02/27/13 2019  HGBA1C 5.9*   Fasting Lipid Panel  Recent Labs  02/28/13 0426  CHOL 132  HDL 38*  LDLCALC 62  TRIG 782162*  CHOLHDL 3.5   Thyroid Function Tests  Recent Labs  02/27/13 2019  TSH 0.910   Disposition  Pt is being discharged home today in good condition.  Follow-up  Plans & Appointments  Follow-up Information   Follow up with [facility], MD On 05/24/2013. (10:30 AM)    Specialty:  Cardiology   Contact information:   [address] [place] [address] [phone]       Follow up with Crawford Givens, MD. (as scheduled.)    Specialty:  [facility] Medicine   Contact information:   [address] [place] [address] [phone]      Discharge Medications    Medication List         amLODipine 5 MG tablet  Commonly known as:  NORVASC  Take 5 mg by mouth daily.       aspirin 81 MG EC tablet  Take 1 tablet (81 mg total) by mouth daily.     clopidogrel 75 MG tablet  Commonly known as:  PLAVIX  Take 75 mg by mouth daily with breakfast.     Coenzyme Q10 200 MG capsule  Take 200 mg by mouth 2 (two) times daily.     FISH OIL BURP-LESS 500 MG Caps  Take 1,000 mg by mouth 2 (two) times daily.     furosemide 40 MG tablet  Commonly known as:  LASIX  Take 40 mg by mouth daily as needed for edema.     lansoprazole 15 MG capsule  Commonly known as:  PREVACID  Take 15 mg by mouth as needed (reflux).     levothyroxine 100 MCG tablet  Commonly known as:  SYNTHROID, LEVOTHROID  Take 100 mcg by mouth daily.     losartan-hydrochlorothiazide 100-25 MG per tablet  Commonly known as:  HYZAAR  Take 1 tablet by mouth daily.     nitroGLYCERIN 0.4 MG SL tablet  Commonly known as:  NITROSTAT  Place 0.4 mg under the tongue every 5 (five) minutes as needed. For chest pain     potassium chloride SA 20 MEQ tablet  Commonly known as:  K-DUR,KLOR-CON  Take 1 tablet (20 mEq total) by mouth 2 (two) times daily.     simvastatin 10 MG tablet  Commonly known as:  ZOCOR  Take 10 mg by mouth daily at 6 PM.       Outstanding Labs/Studies  None  Duration of Discharge Encounter   Greater than 30 minutes including physician time.  Signed, Nicolasa Ducking NP 02/28/2013, 2:34 PM   Patient seen and examined.  Plan as discussed in my rounding note for today and outlined above. Rollene Rotunda  02/28/2013  2:56 PM

## 2013-02-28 NOTE — Discharge Instructions (Signed)
***  PLEASE REMEMBER TO BRING ALL OF YOUR MEDICATIONS TO EACH OF YOUR FOLLOW-UP OFFICE VISITS.  

## 2013-03-09 ENCOUNTER — Ambulatory Visit: Payer: Self-pay | Admitting: Family Medicine

## 2013-03-10 ENCOUNTER — Encounter: Payer: Self-pay | Admitting: Family Medicine

## 2013-03-11 ENCOUNTER — Encounter: Payer: Self-pay | Admitting: Family Medicine

## 2013-03-11 DIAGNOSIS — M8580 Other specified disorders of bone density and structure, unspecified site: Secondary | ICD-10-CM | POA: Insufficient documentation

## 2013-04-10 ENCOUNTER — Ambulatory Visit: Payer: Self-pay | Admitting: Family Medicine

## 2013-04-11 ENCOUNTER — Encounter: Payer: Self-pay | Admitting: Family Medicine

## 2013-04-11 ENCOUNTER — Ambulatory Visit: Payer: Medicare HMO | Admitting: Family Medicine

## 2013-04-12 ENCOUNTER — Encounter: Payer: Self-pay | Admitting: Family Medicine

## 2013-04-12 ENCOUNTER — Ambulatory Visit (INDEPENDENT_AMBULATORY_CARE_PROVIDER_SITE_OTHER): Payer: Medicare HMO | Admitting: Family Medicine

## 2013-04-12 VITALS — BP 168/80 | HR 66 | Temp 98.0°F | Wt 202.8 lb

## 2013-04-12 DIAGNOSIS — R1310 Dysphagia, unspecified: Secondary | ICD-10-CM

## 2013-04-12 NOTE — Patient Instructions (Signed)
Restart the prevacid and take it daily.  If not any better, then we can refer you to the GI.  Take care.

## 2013-04-12 NOTE — Progress Notes (Signed)
Pre visit review using our clinic review tool, if applicable. No additional management support is needed unless otherwise documented below in the visit note.  Trouble swallowing, not acute.  Getting worse.  Going on for months.  Today is the first I knew about this.  Others "have had to pound my back" during the episodes.  Recently with dysphagia to coffee, occ with troubles with solids.  It isn't with all foods or liquids. It can happen with water.  "it's like I have to swallow all the time."  She has occ nocturnal sx.  No vomiting, no blood in vomit.  No diarrhea.  No unintended weight loss.  No bleeding or unexpected bruising (is on plavix with some bruising from that).   No LA noted by patient.  Already on PPI but not daily ,usually only 1-2 times a week or less.  Occ hacking cough and occ raspy voice.  Singing is affected.    Sx don't happen with every meal, it is irregular but is more common than prev.   Meds, vitals, and allergies reviewed.   ROS: See HPI.  Otherwise, noncontributory.  nad ncat Mmm OP wnl Neck supple, no LA rrr ctab abd soft, not ttp, normal BS

## 2013-04-13 ENCOUNTER — Telehealth: Payer: Self-pay | Admitting: Family Medicine

## 2013-04-13 DIAGNOSIS — R1310 Dysphagia, unspecified: Secondary | ICD-10-CM | POA: Insufficient documentation

## 2013-04-13 NOTE — Telephone Encounter (Signed)
Patient called to ask you to refer her to [facility]. She had another choking spell early this am. Please place the referral.

## 2013-04-13 NOTE — Telephone Encounter (Signed)
Referral is in. Thanks.

## 2013-04-13 NOTE — Assessment & Plan Note (Signed)
Could be esophageal spasm vs ring.  D/w pt.  HH and GERD possible, d/w pt.  No red flag sx, ie weight loss, consistent sx.  Would use PPI daily, had been taking rarely.  If not better, refer to GI.  She agrees.  Offered GI referral now, she wants to try PPI for 2 weeks.  This is reasonable.  Call back as needed.

## 2013-04-14 ENCOUNTER — Encounter: Payer: Self-pay | Admitting: Physician Assistant

## 2013-04-18 ENCOUNTER — Telehealth: Payer: Self-pay | Admitting: *Deleted

## 2013-04-18 ENCOUNTER — Ambulatory Visit (INDEPENDENT_AMBULATORY_CARE_PROVIDER_SITE_OTHER): Payer: Medicare HMO | Admitting: Physician Assistant

## 2013-04-18 ENCOUNTER — Encounter: Payer: Self-pay | Admitting: Physician Assistant

## 2013-04-18 VITALS — BP 124/80 | HR 80 | Ht 60.0 in | Wt 202.2 lb

## 2013-04-18 DIAGNOSIS — R0989 Other specified symptoms and signs involving the circulatory and respiratory systems: Secondary | ICD-10-CM

## 2013-04-18 DIAGNOSIS — R1310 Dysphagia, unspecified: Secondary | ICD-10-CM

## 2013-04-18 DIAGNOSIS — R6889 Other general symptoms and signs: Secondary | ICD-10-CM

## 2013-04-18 DIAGNOSIS — Z7901 Long term (current) use of anticoagulants: Secondary | ICD-10-CM

## 2013-04-18 NOTE — Patient Instructions (Signed)
We scheduled a Barium swallow test at [facility]. Date: Thursday 04-20-2013 at 9:30 am . Arrive at 9:15 am. Go to registration just inside the front door of the hospital. Have nothing by mouth after midnight.  You should be hearing from Dr. Lianne Bushyuncan's office regarding the [facility].

## 2013-04-18 NOTE — Telephone Encounter (Signed)
Spoke to kara at [facility], [address] [place]  He is part of the [facility] .  The patient has [employer].   I am to fax the office note from today, and the demographics, and her insurance card.   Diannia RuderKara said once she receives this from us she will call the patient with her appointment.
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
## 2013-04-18 NOTE — Progress Notes (Signed)
i agree with the above plan.  Doesn't really seem like true esophageal dysphagia.

## 2013-04-18 NOTE — Telephone Encounter (Signed)
I called the patient's cell number [phone].  I did leave her a message that I spoke to [place]inda at [place] office, [facility], Dr. Armanda [place] office.  Bonita QuinLinda told me about an ENT MD in the [facility] that would be covered by [employer].  I LM for the patient that I faxed the office note from today, the ins card information and demographics to [place], at [facility].  She will call the patient with the appointment.

## 2013-04-20 ENCOUNTER — Ambulatory Visit (HOSPITAL_COMMUNITY)
Admission: RE | Admit: 2013-04-20 | Discharge: 2013-04-20 | Disposition: A | Discharge status: Home / Self Care | Payer: Medicare HMO | Source: Ambulatory Visit | Attending: Physician Assistant | Admitting: Physician Assistant

## 2013-04-20 DIAGNOSIS — K449 Diaphragmatic hernia without obstruction or gangrene: Secondary | ICD-10-CM | POA: Insufficient documentation

## 2013-04-20 DIAGNOSIS — R1310 Dysphagia, unspecified: Secondary | ICD-10-CM | POA: Insufficient documentation

## 2013-04-20 DIAGNOSIS — K224 Dyskinesia of esophagus: Secondary | ICD-10-CM | POA: Insufficient documentation

## 2013-04-20 DIAGNOSIS — R0989 Other specified symptoms and signs involving the circulatory and respiratory systems: Secondary | ICD-10-CM

## 2013-05-09 ENCOUNTER — Ambulatory Visit (INDEPENDENT_AMBULATORY_CARE_PROVIDER_SITE_OTHER): Payer: Commercial Managed Care - HMO | Admitting: *Deleted

## 2013-05-09 DIAGNOSIS — I441 Atrioventricular block, second degree: Secondary | ICD-10-CM

## 2013-05-10 ENCOUNTER — Other Ambulatory Visit: Payer: Self-pay | Admitting: Family Medicine

## 2013-05-13 LAB — MDC_IDC_ENUM_SESS_TYPE_REMOTE
Battery Impedance: 134 Ohm
Battery Remaining Longevity: 157 mo
Brady Statistic AP VP Percent: 0 %
Brady Statistic AP VS Percent: 25 %
Brady Statistic AS VP Percent: 0 %
Brady Statistic AS VS Percent: 75 %
Date Time Interrogation Session: 20150505132801
Lead Channel Impedance Value: 424 Ohm
Lead Channel Impedance Value: 546 Ohm
Lead Channel Pacing Threshold Amplitude: 0.5 V
Lead Channel Pacing Threshold Pulse Width: 0.4 ms
Lead Channel Pacing Threshold Pulse Width: 0.4 ms
Lead Channel Sensing Intrinsic Amplitude: 2.8 mV
Lead Channel Setting Pacing Amplitude: 2 V
Lead Channel Setting Pacing Amplitude: 3 V
MDC IDC MSMT BATTERY VOLTAGE: 2.8 V
MDC IDC MSMT LEADCHNL RV PACING THRESHOLD AMPLITUDE: 1.5 V
MDC IDC MSMT LEADCHNL RV SENSING INTR AMPL: 5.6 mV
MDC IDC SET LEADCHNL RV PACING PULSEWIDTH: 0.4 ms
MDC IDC SET LEADCHNL RV SENSING SENSITIVITY: 2 mV

## 2013-05-19 NOTE — Progress Notes (Signed)
Remote pacemaker transmission.   

## 2013-05-24 ENCOUNTER — Ambulatory Visit (INDEPENDENT_AMBULATORY_CARE_PROVIDER_SITE_OTHER): Payer: Commercial Managed Care - HMO | Admitting: Cardiovascular Disease

## 2013-05-24 ENCOUNTER — Encounter: Payer: Self-pay | Admitting: Cardiovascular Disease

## 2013-05-24 VITALS — BP 148/88 | HR 72 | Ht 60.0 in | Wt 201.0 lb

## 2013-05-24 DIAGNOSIS — I10 Essential (primary) hypertension: Secondary | ICD-10-CM

## 2013-05-24 DIAGNOSIS — I2510 Atherosclerotic heart disease of native coronary artery without angina pectoris: Secondary | ICD-10-CM

## 2013-05-24 DIAGNOSIS — I441 Atrioventricular block, second degree: Secondary | ICD-10-CM

## 2013-05-24 NOTE — Progress Notes (Signed)
History of Present Illness: 73 yo WF with h/o CAD with DES RCA in June 2011, HTN, hyperlipidemia, hypothyroidism, AV block s/p PPM, CVA and basilar artery stenosis here today for follow up. Left heart cath on 06/13/09 and she was found to have a severe stenosis in the RCA. There was minor disease in the LAD and Circumflex. She was brought back in for PCI on 06/19/09 and I placed a single DES in the RCA. She was admitted to [facility] 08/10/11 with dizziness, chest pain and repeat cath showed stable disease. She was observed to have second degree AV block demonstrated with RBBB. Notes during that hospital visit support multiple consecutive P waves not conducted. Her coreg was stopped. She was discharged and wore an event monitor. This did not document arrhythmias or advanced AV block. There were no episodes of AV block documented. GXT was performed in our office 09/01/11. She was observed during the study to have a LBBB with exertion. She developed exertional symptoms of fatigue, SOB, and CP which corresponded to her LBBB. She was seen by Dr. Johney FrameAllred and continued to have episodes of palpitations and dizziness with moderate activity. A permanent pacemaker was placed on 11/11/11 by Dr. Johney FrameAllred. Last EP visit 11/14/12 and her pacemaker was functioning well. She has been undergoing cerebral angiograms for basilar artery stenosis and has had angiplasty of the basilar artery in August 2010. She is known to have an occluded left common carotid artery. She was admitted to [facility] December 2012 with a CVA. She is being followed by Neurology. [facility] with chest pain. Stress myoview 2/15 with no ischemia, LVEF-79%.   She is here today for follow up. She is feeling well. No chest pain or SOB. She is exercising every day.   Primary Care Physician: Dr. Para Marchuncan   Last Lipid Profile:Lipid Panel     Component Value Date/Time   CHOL 132 02/28/2013 0426   TRIG 162* 02/28/2013 0426   HDL 38* 02/28/2013  0426   CHOLHDL 3.5 02/28/2013 0426   VLDL 32 02/28/2013 0426   LDLCALC 62 02/28/2013 0426     Past Medical History  Diagnosis Date  . Hypothyroidism 1990  . Labile hypertension 08/1991  . HLD (hyperlipidemia) 01/1997  . Fibromyalgia   . Bladder spasms   . Basilar artery stenosis     90%  . CAD (coronary artery disease)     a. 06/2009 cath: severe stenosis RCA with placement drug eluting stent;  b. 08/2011 cath: normal left main, luminal LAD irregularities, luminal LCx irregularities, luminal RCA irregularities and no ISR to mid-RCA DES, LVEF 60-65%; c. 02/2013 nonischemic CL, EF 79%.  . IC (interstitial cystitis)   . Obesity   . Hypertension   . Blood transfusion 1960's    w/childbirth  . CVA (cerebral infarction) December 2012  . Pacemaker 11/11/2011    dual chamber pacemaker  . Second degree Mobitz II AV block   . Family history of anesthesia complication     "first cousin had very fever and almost died" (02/27/2013)  . Malignant hyperthermia     "told to warn everybody before I had surgery that my 1st cousin had malignant hyperthermia; I've had symptoms of it too" (02/27/2013)  . Pneumonia     "twice" (02/27/2013)  . Stroke     "they tell me I've had some mini strokes; nothing that's bothered me though" (02/27/2013)  . Arthritis     "fingers" (02/27/2013)    Past  Surgical History  Procedure Laterality Date  . Bladder suspension  1974  . Cholecystectomy  1985  . Cardiac catheterization  1993    normal  . Renal angioplasty  1993    normal  . Carotid US  11/93    Occl L comm carotid  . Visual evoked response  11/93; 1/01    normal  . US renal/aorta  12/93    Normal  . Emg  1/01    LE, RUE normal  . Carotid US  4/98    L comm occl R-ok  . Carotid US  01/30/03    L occl  R less 40%  . Dexa  10/15/04    Troch -0.4 o/w pos  . Carotid arteriogram      bilat  . Bilateral vert & subclavian angiogram  07/19/08    85-90% stenosis mid basilar art; occluded left vert artery;  occluded left comm carotid artery w/ collat flow  . Basilar artery angioplasty  09/04/08    sm right brachial hematoma  . Cath single vess dz  06/13/09    70% stenosis RCA EF 65-70%  . Coronary angioplasty with stent placement  06/19/09  . Cataract extraction w/ intraocular lens implant Bilateral 2007-2008  . Eye surgery    . Vaginal hysterectomy  1974    dysmennorhea  . Insert / replace / remove pacemaker  11/11/2011    MDT Adapta L implanted by Dr Johney Frame    Current Outpatient Prescriptions  Medication Sig Dispense Refill  . amLODipine (NORVASC) 5 MG tablet Take 5 mg by mouth daily.      Marland Kitchen aspirin EC 81 MG EC tablet Take 1 tablet (81 mg total) by mouth daily.      . clopidogrel (PLAVIX) 75 MG tablet Take 75 mg by mouth daily with breakfast.      . Coenzyme Q10 200 MG capsule Take 200 mg by mouth 2 (two) times daily.      . furosemide (LASIX) 40 MG tablet Take 40 mg by mouth daily as needed for edema.      . lansoprazole (PREVACID) 15 MG capsule Take 15 mg by mouth as needed (reflux).       Marland Kitchen levothyroxine (SYNTHROID, LEVOTHROID) 100 MCG tablet Take 100 mcg by mouth daily.      Marland Kitchen losartan-hydrochlorothiazide (HYZAAR) 100-25 MG per tablet Take 1 tablet by mouth daily.      . nitroGLYCERIN (NITROSTAT) 0.4 MG SL tablet Place 0.4 mg under the tongue every 5 (five) minutes as needed. For chest pain      . Omega-3 Fatty Acids (FISH OIL BURP-LESS) 500 MG CAPS Take 1,000 mg by mouth 2 (two) times daily.       . simvastatin (ZOCOR) 10 MG tablet Take 10 mg by mouth daily at 6 PM.       No current facility-administered medications for this visit.    Allergies  Allergen Reactions  . Anesthetics, Amide     REACTION:FEVER, SYNCOPE  . Codeine Nausea And Vomiting    Reaction:Hallucinations  . Nitrofurantoin Nausea Only    REACTION: Hallucinations  . Orphenadrine Citrate Nausea And Vomiting    REACTION: Hallucination  . Pentosan Polysulfate Sodium     REACTION: rash/ severe headache    History    Social History  . Marital Status: Married    Spouse Name: N/A    Number of Children: 1  . Years of Education: N/A   Occupational History  . Bookkeeper-part time  Social History Main Topics  . Smoking status: Never Smoker   . Smokeless tobacco: Never Used  . Alcohol Use: No  . Drug Use: No  . Sexual Activity: Not Currently    Birth Control/ Protection: Post-menopausal   Other Topics Concern  . 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 Lung cancer Father   . Heart attack Mother   . Lymphoma Mother     s/p chemo  . Stroke Mother     x 2  . Diabetes Brother   . Colon cancer Neg Hx   . Breast cancer Maternal Aunt     Review of Systems:  As stated in the HPI and otherwise negative.   BP 148/88  Pulse 72  Ht 5' (1.524 m)  Wt 201 lb (91.173 kg)  BMI 39.26 kg/m2  Physical Examination: General: Well developed, well nourished, NAD HEENT: OP clear, mucus membranes moist SKIN: warm, dry. No rashes. Neuro: No focal deficits Musculoskeletal: Muscle strength 5/5 all ext Psychiatric: Mood and affect normal Neck: No JVD, no carotid bruits, no thyromegaly, no lymphadenopathy. Lungs:Clear bilaterally, no wheezes, rhonci, crackles Cardiovascular: Regular rate and rhythm. No murmurs, gallops or rubs. Abdomen:Soft. Bowel sounds present. Non-tender.  Extremities: No lower extremity edema. Pulses are 2 + in the bilateral DP/PT.  Assessment and Plan:   1. CAD: Stable. No changes today. Continue Plavix and statin. Continue Lasix 40 mg po Qdaily prn bloating, fluid retention.   2. High grade symptomatic AV block: s/p PPM 11/10/11 per Dr. Johney FrameAllred. Followed in EP clinic.  3. HTN: BP has been well controlled at home. No changes.

## 2013-05-24 NOTE — Patient Instructions (Signed)
Your physician wants you to follow-up in:  6 months. You will receive a reminder letter in the mail two months in advance. If you don't receive a letter, please call our office to schedule the follow-up appointment.   

## 2013-05-25 ENCOUNTER — Encounter: Payer: Self-pay | Admitting: Cardiology

## 2013-06-22 ENCOUNTER — Encounter: Payer: Self-pay | Admitting: Family Medicine

## 2013-06-22 ENCOUNTER — Encounter: Payer: Self-pay | Admitting: Internal Medicine

## 2013-06-22 ENCOUNTER — Ambulatory Visit (INDEPENDENT_AMBULATORY_CARE_PROVIDER_SITE_OTHER): Payer: Commercial Managed Care - HMO | Admitting: Family Medicine

## 2013-06-22 VITALS — BP 134/80 | HR 78 | Temp 98.1°F | Wt 202.0 lb

## 2013-06-22 DIAGNOSIS — H029 Unspecified disorder of eyelid: Secondary | ICD-10-CM

## 2013-06-22 MED ORDER — TRIAMCINOLONE ACETONIDE 0.1 % EX CREA: 1.0000 "application " | TOPICAL_CREAM | Freq: Two times a day (BID) | CUTANEOUS | Status: DC | PRN

## 2013-06-22 NOTE — Progress Notes (Signed)
Pre visit review using our clinic review tool, if applicable. No additional management support is needed unless otherwise documented below in the visit note.  Had seen GI and ENT about dyphagia, sx some better in the meantime w/o intervention o/w.    Now with itchy eyelids B.  Started yesterday.  No FCNAVD.  Eyelids not as red now, after cold compresses.  No vision changes.  It's the lids, not the eyes themselves.  No new makeup.  She had been outside just before the sx started.  No discharge.  All sx are external.    This had happened prev, back in 2005, responded to topical tx.    She had to use some AZO recently, in the last 2 weeks w/o acutely worsening sx.  This is her baseline.    Meds, vitals, and allergies reviewed.   ROS: See HPI.  Otherwise, noncontributory.  nad ncat except for puffy eyelids x4 Tm wnl Nasal and OP exam wnl Fundus wnl B, nondilated exam PERRl, EOMI No FB, conjunctiva wnl Inner portion of eyelids wnl x4 Neck supple, no LA

## 2013-06-22 NOTE — Patient Instructions (Signed)
Use the cream sparingly and this should improve.  If not better, then notify us.  Take care.

## 2013-06-23 DIAGNOSIS — H029 Unspecified disorder of eyelid: Secondary | ICD-10-CM | POA: Insufficient documentation

## 2013-06-23 NOTE — Assessment & Plan Note (Signed)
Appears to be a local reaction, external, likely environmental source.  No eye pathology noted.  No vision changes.  Use small amount of TAC locally, not in/on the eye, and f/u prn. She agrees.

## 2013-07-27 ENCOUNTER — Encounter (HOSPITAL_COMMUNITY): Payer: Self-pay | Admitting: Emergency Medicine

## 2013-07-27 ENCOUNTER — Inpatient Hospital Stay (HOSPITAL_COMMUNITY)
Admission: EM | Admit: 2013-07-27 | Discharge: 2013-07-29 | DRG: 287 | Disposition: A | Discharge status: Home / Self Care | Payer: Medicare HMO | Attending: Internal Medicine | Admitting: Internal Medicine

## 2013-07-27 ENCOUNTER — Emergency Department (HOSPITAL_COMMUNITY): Payer: Medicare HMO

## 2013-07-27 ENCOUNTER — Telehealth: Payer: Self-pay | Admitting: *Deleted

## 2013-07-27 DIAGNOSIS — R7309 Other abnormal glucose: Secondary | ICD-10-CM | POA: Diagnosis present

## 2013-07-27 DIAGNOSIS — N179 Acute kidney failure, unspecified: Secondary | ICD-10-CM | POA: Diagnosis present

## 2013-07-27 DIAGNOSIS — E781 Pure hyperglyceridemia: Secondary | ICD-10-CM | POA: Diagnosis present

## 2013-07-27 DIAGNOSIS — IMO0001 Reserved for inherently not codable concepts without codable children: Secondary | ICD-10-CM | POA: Diagnosis present

## 2013-07-27 DIAGNOSIS — I10 Essential (primary) hypertension: Secondary | ICD-10-CM

## 2013-07-27 DIAGNOSIS — E870 Hyperosmolality and hypernatremia: Secondary | ICD-10-CM | POA: Diagnosis present

## 2013-07-27 DIAGNOSIS — E669 Obesity, unspecified: Secondary | ICD-10-CM | POA: Diagnosis present

## 2013-07-27 DIAGNOSIS — E039 Hypothyroidism, unspecified: Secondary | ICD-10-CM | POA: Diagnosis present

## 2013-07-27 DIAGNOSIS — R079 Chest pain, unspecified: Secondary | ICD-10-CM | POA: Diagnosis not present

## 2013-07-27 DIAGNOSIS — I200 Unstable angina: Secondary | ICD-10-CM | POA: Diagnosis present

## 2013-07-27 DIAGNOSIS — N3010 Interstitial cystitis (chronic) without hematuria: Secondary | ICD-10-CM | POA: Diagnosis present

## 2013-07-27 DIAGNOSIS — Z8673 Personal history of transient ischemic attack (TIA), and cerebral infarction without residual deficits: Secondary | ICD-10-CM

## 2013-07-27 DIAGNOSIS — Z7982 Long term (current) use of aspirin: Secondary | ICD-10-CM

## 2013-07-27 DIAGNOSIS — E7800 Pure hypercholesterolemia, unspecified: Secondary | ICD-10-CM

## 2013-07-27 DIAGNOSIS — Z823 Family history of stroke: Secondary | ICD-10-CM

## 2013-07-27 DIAGNOSIS — Z950 Presence of cardiac pacemaker: Secondary | ICD-10-CM

## 2013-07-27 DIAGNOSIS — M129 Arthropathy, unspecified: Secondary | ICD-10-CM | POA: Diagnosis present

## 2013-07-27 DIAGNOSIS — R0789 Other chest pain: Secondary | ICD-10-CM | POA: Diagnosis present

## 2013-07-27 DIAGNOSIS — I2510 Atherosclerotic heart disease of native coronary artery without angina pectoris: Secondary | ICD-10-CM | POA: Diagnosis not present

## 2013-07-27 DIAGNOSIS — Z9861 Coronary angioplasty status: Secondary | ICD-10-CM

## 2013-07-27 LAB — I-STAT CHEM 8, ED
BUN: 19 mg/dL (ref 6–23)
Calcium, Ion: 1.18 mmol/L (ref 1.13–1.30)
Chloride: 104 mEq/L (ref 96–112)
Creatinine, Ser: 1.3 mg/dL — ABNORMAL HIGH (ref 0.50–1.10)
Glucose, Bld: 215 mg/dL — ABNORMAL HIGH (ref 70–99)
HEMATOCRIT: 45 % (ref 36.0–46.0)
HEMOGLOBIN: 15.3 g/dL — AB (ref 12.0–15.0)
POTASSIUM: 3.1 meq/L — AB (ref 3.7–5.3)
Sodium: 142 mEq/L (ref 137–147)
TCO2: 24 mmol/L (ref 0–100)

## 2013-07-27 LAB — CBC WITH DIFFERENTIAL/PLATELET
Basophils Absolute: 0 10*3/uL (ref 0.0–0.1)
Basophils Relative: 0 % (ref 0–1)
Eosinophils Absolute: 0.2 10*3/uL (ref 0.0–0.7)
Eosinophils Relative: 2 % (ref 0–5)
HEMATOCRIT: 42.5 % (ref 36.0–46.0)
HEMOGLOBIN: 14.6 g/dL (ref 12.0–15.0)
LYMPHS PCT: 23 % (ref 12–46)
Lymphs Abs: 2.1 10*3/uL (ref 0.7–4.0)
MCH: 30.3 pg (ref 26.0–34.0)
MCHC: 34.4 g/dL (ref 30.0–36.0)
MCV: 88.2 fL (ref 78.0–100.0)
MONO ABS: 0.6 10*3/uL (ref 0.1–1.0)
Monocytes Relative: 7 % (ref 3–12)
NEUTROS PCT: 68 % (ref 43–77)
Neutro Abs: 6 10*3/uL (ref 1.7–7.7)
Platelets: 234 10*3/uL (ref 150–400)
RBC: 4.82 MIL/uL (ref 3.87–5.11)
RDW: 13.2 % (ref 11.5–15.5)
WBC: 9 10*3/uL (ref 4.0–10.5)

## 2013-07-27 LAB — COMPREHENSIVE METABOLIC PANEL
ALBUMIN: 3.8 g/dL (ref 3.5–5.2)
ALT: 21 U/L (ref 0–35)
AST: 22 U/L (ref 0–37)
Alkaline Phosphatase: 58 U/L (ref 39–117)
Anion gap: 15 (ref 5–15)
BILIRUBIN TOTAL: 0.4 mg/dL (ref 0.3–1.2)
BUN: 19 mg/dL (ref 6–23)
CHLORIDE: 104 meq/L (ref 96–112)
CO2: 25 meq/L (ref 19–32)
CREATININE: 1.13 mg/dL — AB (ref 0.50–1.10)
Calcium: 9.5 mg/dL (ref 8.4–10.5)
GFR calc Af Amer: 56 mL/min — ABNORMAL LOW (ref >90)
GFR, EST NON AFRICAN AMERICAN: 48 mL/min — AB (ref >90)
Glucose, Bld: 204 mg/dL — ABNORMAL HIGH (ref 70–99)
Potassium: 3.5 mEq/L — ABNORMAL LOW (ref 3.7–5.3)
Sodium: 144 mEq/L (ref 137–147)
Total Protein: 6.8 g/dL (ref 6.0–8.3)

## 2013-07-27 LAB — I-STAT TROPONIN, ED
TROPONIN I, POC: 0.01 ng/mL (ref 0.00–0.08)
Troponin i, poc: 0.02 ng/mL (ref 0.00–0.08)

## 2013-07-27 MED ORDER — MORPHINE SULFATE 2 MG/ML IJ SOLN
2.0000 mg | Freq: Once | INTRAMUSCULAR | Status: AC
Start: 2013-07-27 — End: 2013-07-27
  Administered 2013-07-27: 2 mg via INTRAVENOUS
  Filled 2013-07-27: qty 1

## 2013-07-27 MED ORDER — ONDANSETRON HCL 4 MG/2ML IJ SOLN
4.0000 mg | Freq: Once | INTRAMUSCULAR | Status: AC
  Administered 2013-07-27: 4 mg via INTRAVENOUS
  Filled 2013-07-27: qty 2

## 2013-07-27 MED ORDER — IOHEXOL 350 MG/ML SOLN
100.0000 mL | Freq: Once | INTRAVENOUS | Status: AC | PRN
  Administered 2013-07-27: 100 mL via INTRAVENOUS

## 2013-07-27 MED ORDER — SODIUM CHLORIDE 0.9 % IV SOLN
INTRAVENOUS | Status: DC
  Administered 2013-07-27 – 2013-07-28 (×2): via INTRAVENOUS

## 2013-07-27 MED ORDER — PROMETHAZINE HCL 25 MG/ML IJ SOLN
12.5000 mg | Freq: Once | INTRAMUSCULAR | Status: AC
  Administered 2013-07-27: 12.5 mg via INTRAVENOUS
  Filled 2013-07-27 (×2): qty 1

## 2013-07-27 MED ORDER — MORPHINE SULFATE 2 MG/ML IJ SOLN
2.0000 mg | Freq: Once | INTRAMUSCULAR | Status: AC
  Administered 2013-07-27: 2 mg via INTRAVENOUS
  Filled 2013-07-27: qty 1

## 2013-07-27 MED ORDER — SODIUM CHLORIDE 0.9 % IV BOLUS (SEPSIS)
250.0000 mL | Freq: Once | INTRAVENOUS | Status: AC
  Administered 2013-07-27: 250 mL via INTRAVENOUS

## 2013-07-27 NOTE — ED Notes (Addendum)
Lucile Craterana Dunn, Cardiology PA wants to be called once the [employer] Faxed report has arrived. [phone]

## 2013-07-27 NOTE — Telephone Encounter (Signed)
Pt calling with c/o sob and cp. Pt states this started about an hour ago.  Pt rates her pain a 8 out of 10.  Pt took nitro with no relief.  Pt describes her cp as mid sternal in location, and a squeezing, gnawing pain.  Pt states pain radiates into left arm.  Pt states "something just doesn't feel right." When talking to pt, could assess she couldn't talk in full sentences, d/t her sob.  Pt was gasping at times.  Pt gave her Husband the phone to talk to me, and Husband stated that pt is having "very bad cp." Informed pts Husband that given pts history and complaints, the Husband should call 911 immediately.  Pt states "she will not let me do that." "I've already tried." Informed Husband the importance of pt getting transported to [facility] ED via EMS, due to cardiac hx, location of pts home from Hospital, and the importance of time when someone could possibly be having a heart attack. Husband verbalized understanding and agrees with this plan.  Wife finally agreed as well.  Husband asked if I would call 911, and I did.  Biomedical scientistCard master notified.  Will route this message to Dr Clifton JamesMcAlhany and nurse for further review.

## 2013-07-27 NOTE — ED Notes (Signed)
Pt has returned from radiology.  

## 2013-07-27 NOTE — Telephone Encounter (Signed)
Pt sent to ED. cdm

## 2013-07-27 NOTE — ED Notes (Signed)
[facility] with [facility]/o [facility]. Pt has been having CP that comes and goes for 3 weeks especially with exertion and became worse today while at grocery store. Pt stated that her CP became worse, dizziness, nausea, diaphoretic and SOB around 1430p today. Pt has demand pacemaker and when pacemaker comes on pt stated that she coughs and chest pressure would increase. EMS administered ASA 324mg  and Nitro x 2. Pacemaker is pacing at a rate between 111-113. Chest pain is described as tightness and pressure that radiates between shoulder blades. Pacemaker was placed in November 2014.

## 2013-07-28 ENCOUNTER — Encounter (HOSPITAL_COMMUNITY): Payer: Self-pay | Admitting: Internal Medicine

## 2013-07-28 ENCOUNTER — Encounter (HOSPITAL_COMMUNITY)
Admission: EM | Disposition: A | Discharge status: Home / Self Care | Payer: Self-pay | Source: Home / Self Care | Attending: Internal Medicine

## 2013-07-28 DIAGNOSIS — Z9861 Coronary angioplasty status: Secondary | ICD-10-CM | POA: Diagnosis not present

## 2013-07-28 DIAGNOSIS — N179 Acute kidney failure, unspecified: Secondary | ICD-10-CM | POA: Diagnosis present

## 2013-07-28 DIAGNOSIS — M129 Arthropathy, unspecified: Secondary | ICD-10-CM | POA: Diagnosis present

## 2013-07-28 DIAGNOSIS — I10 Essential (primary) hypertension: Secondary | ICD-10-CM | POA: Diagnosis present

## 2013-07-28 DIAGNOSIS — I2510 Atherosclerotic heart disease of native coronary artery without angina pectoris: Secondary | ICD-10-CM

## 2013-07-28 DIAGNOSIS — I200 Unstable angina: Secondary | ICD-10-CM | POA: Diagnosis present

## 2013-07-28 DIAGNOSIS — R7309 Other abnormal glucose: Secondary | ICD-10-CM | POA: Diagnosis present

## 2013-07-28 DIAGNOSIS — R079 Chest pain, unspecified: Secondary | ICD-10-CM

## 2013-07-28 DIAGNOSIS — N3010 Interstitial cystitis (chronic) without hematuria: Secondary | ICD-10-CM | POA: Diagnosis present

## 2013-07-28 DIAGNOSIS — E7800 Pure hypercholesterolemia, unspecified: Secondary | ICD-10-CM

## 2013-07-28 DIAGNOSIS — Z8673 Personal history of transient ischemic attack (TIA), and cerebral infarction without residual deficits: Secondary | ICD-10-CM | POA: Diagnosis not present

## 2013-07-28 DIAGNOSIS — E781 Pure hyperglyceridemia: Secondary | ICD-10-CM | POA: Diagnosis present

## 2013-07-28 DIAGNOSIS — E039 Hypothyroidism, unspecified: Secondary | ICD-10-CM | POA: Diagnosis present

## 2013-07-28 DIAGNOSIS — IMO0001 Reserved for inherently not codable concepts without codable children: Secondary | ICD-10-CM | POA: Diagnosis present

## 2013-07-28 DIAGNOSIS — Z823 Family history of stroke: Secondary | ICD-10-CM | POA: Diagnosis not present

## 2013-07-28 DIAGNOSIS — E669 Obesity, unspecified: Secondary | ICD-10-CM | POA: Diagnosis present

## 2013-07-28 DIAGNOSIS — Z7982 Long term (current) use of aspirin: Secondary | ICD-10-CM | POA: Diagnosis not present

## 2013-07-28 DIAGNOSIS — Z950 Presence of cardiac pacemaker: Secondary | ICD-10-CM | POA: Diagnosis not present

## 2013-07-28 DIAGNOSIS — E870 Hyperosmolality and hypernatremia: Secondary | ICD-10-CM | POA: Diagnosis present

## 2013-07-28 HISTORY — PX: LEFT HEART CATHETERIZATION WITH CORONARY ANGIOGRAM: SHX5451

## 2013-07-28 LAB — CBC
HEMATOCRIT: 40.8 % (ref 36.0–46.0)
HEMOGLOBIN: 13.4 g/dL (ref 12.0–15.0)
MCH: 29.6 pg (ref 26.0–34.0)
MCHC: 32.8 g/dL (ref 30.0–36.0)
MCV: 90.3 fL (ref 78.0–100.0)
Platelets: 217 10*3/uL (ref 150–400)
RBC: 4.52 MIL/uL (ref 3.87–5.11)
RDW: 13.5 % (ref 11.5–15.5)
WBC: 8.9 10*3/uL (ref 4.0–10.5)

## 2013-07-28 LAB — COMPREHENSIVE METABOLIC PANEL
ALT: 17 U/L (ref 0–35)
AST: 16 U/L (ref 0–37)
Albumin: 3.3 g/dL — ABNORMAL LOW (ref 3.5–5.2)
Alkaline Phosphatase: 48 U/L (ref 39–117)
Anion gap: 10 (ref 5–15)
BUN: 16 mg/dL (ref 6–23)
CALCIUM: 8.6 mg/dL (ref 8.4–10.5)
CO2: 30 mEq/L (ref 19–32)
Chloride: 108 mEq/L (ref 96–112)
Creatinine, Ser: 0.98 mg/dL (ref 0.50–1.10)
GFR, EST AFRICAN AMERICAN: 66 mL/min — AB (ref >90)
GFR, EST NON AFRICAN AMERICAN: 57 mL/min — AB (ref >90)
GLUCOSE: 96 mg/dL (ref 70–99)
Potassium: 4.7 mEq/L (ref 3.7–5.3)
Sodium: 148 mEq/L — ABNORMAL HIGH (ref 137–147)
Total Bilirubin: 0.4 mg/dL (ref 0.3–1.2)
Total Protein: 5.9 g/dL — ABNORMAL LOW (ref 6.0–8.3)

## 2013-07-28 LAB — CBC WITH DIFFERENTIAL/PLATELET

## 2013-07-28 LAB — DIFFERENTIAL
Basophils Absolute: 0 10*3/uL (ref 0.0–0.1)
Basophils Relative: 0 % (ref 0–1)
EOS ABS: 0.2 10*3/uL (ref 0.0–0.7)
EOS PCT: 2 % (ref 0–5)
LYMPHS ABS: 2.3 10*3/uL (ref 0.7–4.0)
Lymphocytes Relative: 26 % (ref 12–46)
MONO ABS: 0.8 10*3/uL (ref 0.1–1.0)
Monocytes Relative: 9 % (ref 3–12)
Neutro Abs: 5.5 10*3/uL (ref 1.7–7.7)
Neutrophils Relative %: 63 % (ref 43–77)

## 2013-07-28 LAB — HEMOGLOBIN A1C
Hgb A1c MFr Bld: 5.7 % — ABNORMAL HIGH (ref <5.7)
Mean Plasma Glucose: 117 mg/dL — ABNORMAL HIGH (ref <117)

## 2013-07-28 LAB — TROPONIN I
Troponin I: <0.3 ng/mL (ref <0.30)
Troponin I: <0.3 ng/mL (ref <0.30)
Troponin I: <0.3 ng/mL (ref <0.30)

## 2013-07-28 LAB — HEPARIN LEVEL (UNFRACTIONATED): HEPARIN UNFRACTIONATED: 0.37 [IU]/mL (ref 0.30–0.70)

## 2013-07-28 LAB — PROTIME-INR
INR: 1.1 (ref 0.00–1.49)
Prothrombin Time: 14.2 seconds (ref 11.6–15.2)

## 2013-07-28 LAB — PROCALCITONIN: Procalcitonin: <0.1 ng/mL

## 2013-07-28 SURGERY — LEFT HEART CATHETERIZATION WITH CORONARY ANGIOGRAM
Anesthesia: LOCAL

## 2013-07-28 MED ORDER — ASPIRIN 81 MG PO CHEW: 81.0000 mg | CHEWABLE_TABLET | ORAL | Status: DC

## 2013-07-28 MED ORDER — MIDAZOLAM HCL 2 MG/2ML IJ SOLN
INTRAMUSCULAR | Status: AC
  Filled 2013-07-28: qty 2

## 2013-07-28 MED ORDER — ONDANSETRON HCL 4 MG/2ML IJ SOLN: 4.0000 mg | Freq: Four times a day (QID) | INTRAMUSCULAR | Status: DC | PRN

## 2013-07-28 MED ORDER — HEPARIN BOLUS VIA INFUSION
4000.0000 [IU] | Freq: Once | INTRAVENOUS | Status: AC
  Administered 2013-07-28: 4000 [IU] via INTRAVENOUS
  Filled 2013-07-28: qty 4000

## 2013-07-28 MED ORDER — LOSARTAN POTASSIUM 50 MG PO TABS
100.0000 mg | ORAL_TABLET | Freq: Every day | ORAL | Status: DC
  Administered 2013-07-28 – 2013-07-29 (×2): 100 mg via ORAL
  Filled 2013-07-28 (×2): qty 2

## 2013-07-28 MED ORDER — NITROGLYCERIN 0.4 MG/HR TD PT24
0.4000 mg | MEDICATED_PATCH | TRANSDERMAL | Status: DC
  Administered 2013-07-28 – 2013-07-29 (×2): 0.4 mg via TRANSDERMAL
  Filled 2013-07-28 (×3): qty 1

## 2013-07-28 MED ORDER — SODIUM CHLORIDE 0.9 % IJ SOLN
3.0000 mL | Freq: Two times a day (BID) | INTRAMUSCULAR | Status: DC
  Administered 2013-07-28 – 2013-07-29 (×2): 3 mL via INTRAVENOUS

## 2013-07-28 MED ORDER — HEPARIN (PORCINE) IN NACL 100-0.45 UNIT/ML-% IJ SOLN
900.0000 [IU]/h | INTRAMUSCULAR | Status: DC
  Administered 2013-07-28: 900 [IU]/h via INTRAVENOUS
  Filled 2013-07-28 (×2): qty 250

## 2013-07-28 MED ORDER — MORPHINE SULFATE 2 MG/ML IJ SOLN
1.0000 mg | INTRAMUSCULAR | Status: DC | PRN
  Administered 2013-07-28: 1 mg via INTRAVENOUS
  Filled 2013-07-28: qty 1

## 2013-07-28 MED ORDER — CLOPIDOGREL BISULFATE 75 MG PO TABS
75.0000 mg | ORAL_TABLET | Freq: Every day | ORAL | Status: DC
  Administered 2013-07-28 – 2013-07-29 (×2): 75 mg via ORAL
  Filled 2013-07-28 (×3): qty 1

## 2013-07-28 MED ORDER — LEVOFLOXACIN IN D5W 750 MG/150ML IV SOLN
750.0000 mg | Freq: Once | INTRAVENOUS | Status: AC
  Administered 2013-07-28: 750 mg via INTRAVENOUS
  Filled 2013-07-28: qty 150

## 2013-07-28 MED ORDER — SODIUM CHLORIDE 0.9 % IV SOLN: 250.0000 mL | INTRAVENOUS | Status: DC | PRN

## 2013-07-28 MED ORDER — LEVOFLOXACIN IN D5W 750 MG/150ML IV SOLN: 750.0000 mg | INTRAVENOUS | Status: DC

## 2013-07-28 MED ORDER — AMLODIPINE BESYLATE 5 MG PO TABS
5.0000 mg | ORAL_TABLET | Freq: Every day | ORAL | Status: DC
  Administered 2013-07-28 – 2013-07-29 (×2): 5 mg via ORAL
  Filled 2013-07-28 (×2): qty 1

## 2013-07-28 MED ORDER — SODIUM CHLORIDE 0.9 % IJ SOLN: 3.0000 mL | Freq: Two times a day (BID) | INTRAMUSCULAR | Status: DC

## 2013-07-28 MED ORDER — NITROGLYCERIN 1 MG/10 ML FOR IR/CATH LAB
INTRA_ARTERIAL | Status: AC
  Filled 2013-07-28: qty 10

## 2013-07-28 MED ORDER — LEVOTHYROXINE SODIUM 100 MCG PO TABS
100.0000 ug | ORAL_TABLET | Freq: Every day | ORAL | Status: DC
  Administered 2013-07-28 – 2013-07-29 (×2): 100 ug via ORAL
  Filled 2013-07-28 (×3): qty 1

## 2013-07-28 MED ORDER — ACETAMINOPHEN 650 MG RE SUPP: 650.0000 mg | Freq: Four times a day (QID) | RECTAL | Status: DC | PRN

## 2013-07-28 MED ORDER — HEPARIN SODIUM (PORCINE) 1000 UNIT/ML IJ SOLN
INTRAMUSCULAR | Status: AC
  Filled 2013-07-28: qty 1

## 2013-07-28 MED ORDER — VERAPAMIL HCL 2.5 MG/ML IV SOLN
INTRAVENOUS | Status: AC
  Filled 2013-07-28: qty 2

## 2013-07-28 MED ORDER — PANTOPRAZOLE SODIUM 40 MG PO TBEC
40.0000 mg | DELAYED_RELEASE_TABLET | Freq: Every day | ORAL | Status: DC
Start: 2013-07-28 — End: 2013-07-29
  Administered 2013-07-28 – 2013-07-29 (×2): 40 mg via ORAL
  Filled 2013-07-28 (×2): qty 1

## 2013-07-28 MED ORDER — FUROSEMIDE 40 MG PO TABS
40.0000 mg | ORAL_TABLET | Freq: Every day | ORAL | Status: DC | PRN
  Filled 2013-07-28: qty 1

## 2013-07-28 MED ORDER — ONDANSETRON HCL 4 MG PO TABS: 4.0000 mg | ORAL_TABLET | Freq: Four times a day (QID) | ORAL | Status: DC | PRN

## 2013-07-28 MED ORDER — LIDOCAINE HCL (PF) 1 % IJ SOLN
INTRAMUSCULAR | Status: AC
  Filled 2013-07-28: qty 30

## 2013-07-28 MED ORDER — SODIUM CHLORIDE 0.9 % IV SOLN: INTRAVENOUS | Status: AC

## 2013-07-28 MED ORDER — ACETAMINOPHEN 325 MG PO TABS: 650.0000 mg | ORAL_TABLET | Freq: Four times a day (QID) | ORAL | Status: DC | PRN

## 2013-07-28 MED ORDER — HYDROCHLOROTHIAZIDE 25 MG PO TABS
25.0000 mg | ORAL_TABLET | Freq: Every day | ORAL | Status: DC
  Administered 2013-07-28: 25 mg via ORAL
  Filled 2013-07-28: qty 1

## 2013-07-28 MED ORDER — OMEGA-3-ACID ETHYL ESTERS 1 G PO CAPS
1000.0000 mg | ORAL_CAPSULE | Freq: Two times a day (BID) | ORAL | Status: DC
  Administered 2013-07-28 – 2013-07-29 (×3): 1000 mg via ORAL
  Filled 2013-07-28 (×4): qty 1

## 2013-07-28 MED ORDER — SODIUM CHLORIDE 0.9 % IJ SOLN
3.0000 mL | INTRAMUSCULAR | Status: DC | PRN
Start: 2013-07-28 — End: 2013-07-28

## 2013-07-28 MED ORDER — HEPARIN (PORCINE) IN NACL 2-0.9 UNIT/ML-% IJ SOLN
INTRAMUSCULAR | Status: AC
  Filled 2013-07-28: qty 1000

## 2013-07-28 MED ORDER — SODIUM CHLORIDE 0.9 % IJ SOLN
3.0000 mL | Freq: Two times a day (BID) | INTRAMUSCULAR | Status: DC
  Administered 2013-07-29: 3 mL via INTRAVENOUS

## 2013-07-28 MED ORDER — SODIUM CHLORIDE 0.9 % IV SOLN
INTRAVENOUS | Status: AC
  Administered 2013-07-28: 11:00:00 via INTRAVENOUS

## 2013-07-28 MED ORDER — ASPIRIN EC 81 MG PO TBEC
81.0000 mg | DELAYED_RELEASE_TABLET | Freq: Every morning | ORAL | Status: DC
  Administered 2013-07-28 – 2013-07-29 (×2): 81 mg via ORAL
  Filled 2013-07-28 (×2): qty 1

## 2013-07-28 MED ORDER — SODIUM CHLORIDE 0.9 % IV SOLN: 1.0000 mL/kg/h | INTRAVENOUS | Status: DC

## 2013-07-28 MED ORDER — LOSARTAN POTASSIUM-HCTZ 100-25 MG PO TABS: 1.0000 | ORAL_TABLET | Freq: Every morning | ORAL | Status: DC

## 2013-07-28 MED ORDER — ASPIRIN EC 325 MG PO TBEC: 325.0000 mg | DELAYED_RELEASE_TABLET | Freq: Every day | ORAL | Status: DC

## 2013-07-28 MED ORDER — SIMVASTATIN 10 MG PO TABS
10.0000 mg | ORAL_TABLET | Freq: Every day | ORAL | Status: DC
  Filled 2013-07-28: qty 1

## 2013-07-28 MED ORDER — FENTANYL CITRATE 0.05 MG/ML IJ SOLN
INTRAMUSCULAR | Status: AC
  Filled 2013-07-28: qty 2

## 2013-07-28 MED ORDER — NITROGLYCERIN 0.4 MG SL SUBL: 0.4000 mg | SUBLINGUAL_TABLET | SUBLINGUAL | Status: DC | PRN

## 2013-07-28 NOTE — Progress Notes (Signed)
ANTICOAGULATION CONSULT NOTE - Follow Up Consult  Pharmacy Consult for heparin Indication: chest pain/ACS  Allergies  Allergen Reactions  . Anesthetics, Amide Other (See Comments)    REACTION:FEVER, SYNCOPE  . Codeine Nausea And Vomiting    Reaction:Hallucinations  . Nitrofurantoin Nausea Only and Other (See Comments)    REACTION: Hallucinations  . Orphenadrine Citrate Nausea And Vomiting and Other (See Comments)    REACTION: Hallucination  . Pentosan Polysulfate Sodium Rash and Other (See Comments)    REACTION: rash/ severe headache    Patient Measurements: Height: 5' (152.4 cm) Weight: 207 lb 7.3 oz (94.1 kg) IBW/kg (Calculated) : 45.5 Heparin Dosing Weight: 67 kg  Vital Signs: Temp: 98.4 F (36.9 C) (07/24 1055) Temp src: Oral (07/24 1055) BP: 145/66 mmHg (07/24 1055) Pulse Rate: 62 (07/24 1055)  Labs:  Recent Labs  07/27/13 1703 07/27/13 1718 07/28/13 0442 07/28/13 0500 07/28/13 0840 07/28/13 1010  HGB 14.6 15.3* 13.4 PENDING  --   --   HCT 42.5 45.0 40.8 PENDING  --   --   PLT 234  --  217 PENDING  --   --   HEPARINUNFRC  --   --   --   --   --  0.37  CREATININE 1.13* 1.30*  --  0.98  --   --   TROPONINI  --   --   --  <0.30 <0.30  --     Estimated Creatinine Clearance: 54.7 ml/min (by C-G formula based on Cr of 0.98).  Assessment: Patient is a 73 y.o F on heparin for CP with plan for cardiac cath later today. Heparin level is at goal this morning with 0.37.  CBC stable, no bleeding documented.  Goal of Therapy:  Heparin level 0.3-0.7 units/ml Monitor platelets by anticoagulation protocol: Yes   Plan:  1) continue heparin drip at 900 units/hr 2) f/u after cath 3) scr down to 0.98 today (est crcl~55)-- Will adjust levaquin to 750mg  IV q24h for renal function   Grant Swager P 07/28/2013,11:29 AM

## 2013-07-28 NOTE — Interval H&P Note (Signed)
History and Physical Interval Note:  07/28/2013 2:33 PM  Kelsey Dunn  has presented today for cardiac cath with the diagnosis of cp  The various methods of treatment have been discussed with the patient and family. After consideration of risks, benefits and other options for treatment, the patient has consented to  Procedure(s): LEFT HEART CATHETERIZATION WITH CORONARY ANGIOGRAM (N/A) as a surgical intervention .  The patient's history has been reviewed, patient examined, no change in status, stable for surgery.  I have reviewed the patient's chart and labs.  Questions were answered to the patient's satisfaction.    Cath Lab Visit (complete for each Cath Lab visit)  Clinical Evaluation Leading to the Procedure:   ACS: No.  Non-ACS:    Anginal Classification: CCS III  Anti-ischemic medical therapy: Minimal Therapy (1 class of medications)  Non-Invasive Test Results: No non-invasive testing performed  Prior CABG: No previous CABG        Kelsey Dunn

## 2013-07-28 NOTE — Progress Notes (Signed)
ANTICOAGULATION CONSULT NOTE - Initial Consult  Pharmacy Consult for Heparin  Indication: chest pain/ACS  Allergies  Allergen Reactions  . Anesthetics, Amide Other (See Comments)    REACTION:FEVER, SYNCOPE  . Codeine Nausea And Vomiting    Reaction:Hallucinations  . Nitrofurantoin Nausea Only and Other (See Comments)    REACTION: Hallucinations  . Orphenadrine Citrate Nausea And Vomiting and Other (See Comments)    REACTION: Hallucination  . Pentosan Polysulfate Sodium Rash and Other (See Comments)    REACTION: rash/ severe headache   Patient Measurements: Heparin dosing wt: ~67 kg  Labs:  Recent Labs  07/27/13 1703 07/27/13 1718  HGB 14.6 15.3*  HCT 42.5 45.0  PLT 234  --   CREATININE 1.13* 1.30*    Assessment: 73 y/o F with cardiac history to start heparin for CP. Troponin negative so far, mild bump in Scr, CBC good, other labs as above.   Goal of Therapy:  Heparin level 0.3-0.7 units/ml Monitor platelets by anticoagulation protocol: Yes   Plan:  -Heparin 4000 units BOLUS -Start heparin drip at 900 units/hr -1000 HL -Daily CBC/HL -Monitor for bleeding  Abran DukeLedford, Phuong Hillary 07/28/2013,1:36 AM

## 2013-07-28 NOTE — CV Procedure (Signed)
      Cardiac Catheterization Operative Report  Kelsey HopeConnie R Dunn [id number] 7/24/20153:21 PM Crawford GivensGraham [name], MD  Procedure Performed:  1. Left Heart Catheterization 2. Selective Coronary Angiography 3. Left ventricular angiogram  Operator: Verne Carrowhristopher McAlhany, MD  Arterial access site:  Left radial artery.   Indication: 73 yo WF with history of CAD, obesity, prior stent RCA, high grade AV block s/p pacemaker admitted with chest pain. CT PE study negative for PE.                                  Procedure Details: The risks, benefits, complications, treatment options, and expected outcomes were discussed with the patient. The patient and/or family concurred with the proposed plan, giving informed consent. The patient was brought to the cath lab after IV hydration was begun and oral premedication was given. The patient was further sedated with Versed and Fentanyl. The right wrist was assessed with a reverse Allens test which was positive. The left wrist was prepped and draped in a sterile fashion. 1% lidocaine was used for local anesthesia. Using the modified Seldinger access technique, a 5 French sheath was placed in the left radial artery. 3 mg Verapamil was given through the sheath. 5000 units IV heparin was given. Standard diagnostic catheters were used to perform selective coronary angiography. A pigtail catheter was used to perform a left ventricular angiogram. The sheath was removed from the right radial artery and a Terumo hemostasis band was applied at the arteriotomy site on the left wrist.   There were no immediate complications. The patient was taken to the recovery area in stable condition.   Hemodynamic Findings: Central aortic pressure: 135/67 Left ventricular pressure: 147/12/21  Angiographic Findings:  Left main:  No obstructive disease.   Left Anterior Descending Artery: Large caliber vessel that courses to the apex. The mid vessel has diffuse 20% stenosis. There is a  very small caliber diagonal branch with proximal 40% stenosis. There are no flow limiting lesions noted.   Circumflex Artery: Moderate caliber vessel with moderate caliber obtuse marginal branch. No obstructive disease.   Right Coronary Artery: Large caliber vessel with 20% mid stenosis, patent distal stent without restenosis.   Left Ventricular Angiogram: LVEF=65-70%.   Impression: 1. Single vessel CAD with patent stent RCA 2. Normal LV function 3. Non-cardiac chest pain  Recommendations: Continue medical management of CAD. No further cardiac workup at this time.        Complications:  None. The patient tolerated the procedure well.

## 2013-07-28 NOTE — H&P (Signed)
[facility] History and Physical  ANQUINETTE PIERRO AVW:[id number] DOB: 20-Dec-1940 DOA: 07/27/2013  Referring physician: ER physician. PCP: Crawford Givens, MD   Chief Complaint: Chest pain.  HPI: Kelsey Dunn is a 73 y.o. female with history of CAD status post stenting has had stress Myoview this February 2015 which was negative, hypertension, pacemaker placement for AV block, hyperlipidemia and hypothyroidism presents to the ER because of chest pain. Patient states she has been having chest pain off and on for last 3 weeks but yesterday around noontime when she was shopping she developed retrosternal pressure with diaphoresis and nausea which was more than usual. In the ER patient was found to be tachycardic and EKG was concerning for V. tach and on-call cardiologist Dr. Tresa Endo was consulted and pacemaker was interrogated and there was no V. tach. Since patient was tachycardic CT angiogram of the chest was done which was negative for any PE. Patient's chest pain improved with morphine and has been admitted for further management. Since admission patient has been having some intermittent chest pain which gets better with morphine. Patient denies any abdominal pain fever chills diarrhea productive cough.   Review of Systems: As presented in the history of presenting illness, rest negative.  Past Medical History  Diagnosis Date  . Hypothyroidism 1990  . Labile hypertension 08/1991  . HLD (hyperlipidemia) 01/1997  . Fibromyalgia   . Bladder spasms   . Basilar artery stenosis     90%  . CAD (coronary artery disease)     a. 06/2009 cath: severe stenosis RCA with placement drug eluting stent;  b. 08/2011 cath: normal left main, luminal LAD irregularities, luminal LCx irregularities, luminal RCA irregularities and no ISR to mid-RCA DES, LVEF 60-65%; c. 02/2013 nonischemic CL, EF 79%.  . IC (interstitial cystitis)   . Obesity   . Hypertension   . Blood transfusion 1960's    w/childbirth  . CVA  (cerebral infarction) December 2012  . Pacemaker 11/11/2011    dual chamber pacemaker  . Second degree Mobitz II AV block   . Family history of anesthesia complication     "first cousin had very fever and almost died" (2013/03/18)  . Malignant hyperthermia     "told to warn everybody before I had surgery that my 1st cousin had malignant hyperthermia; I've had symptoms of it too" (Mar 18, 2013)  . Pneumonia     "twice" (March 18, 2013)  . Stroke     "they tell me I've had some mini strokes; nothing that's bothered me though" (March 18, 2013)  . Arthritis     "fingers" (03-18-2013)   Past Surgical History  Procedure Laterality Date  . Bladder suspension  1974  . Cholecystectomy  1985  . Cardiac catheterization  1993    normal  . Renal angioplasty  1993    normal  . Carotid US  11/93    Occl L comm carotid  . Visual evoked response  11/93; 1/01    normal  . US renal/aorta  12/93    Normal  . Emg  1/01    LE, RUE normal  . Carotid US  4/98    L comm occl R-ok  . Carotid US  01/30/03    L occl  R less 40%  . Dexa  10/15/04    Troch -0.4 o/w pos  . Carotid arteriogram      bilat  . Bilateral vert & subclavian angiogram  07/19/08    85-90% stenosis mid basilar art; occluded left vert artery; occluded  left comm carotid artery w/ collat flow  . Basilar artery angioplasty  09/04/08    sm right brachial hematoma  . Cath single vess dz  06/13/09    70% stenosis RCA EF 65-70%  . Coronary angioplasty with stent placement  06/19/09  . Cataract extraction w/ intraocular lens implant Bilateral 2007-2008  . Eye surgery    . Vaginal hysterectomy  1974    dysmennorhea  . Insert / replace / remove pacemaker  11/11/2011    MDT Adapta L implanted by Dr Johney Frame. [employer]   Social History:  reports that she has never smoked. She has never used smokeless tobacco. She reports that she does not drink alcohol or use illicit drugs. Where does patient live home. Can patient participate in ADLs? Yes.  Allergies   Allergen Reactions  . Anesthetics, Amide Other (See Comments)    REACTION:FEVER, SYNCOPE  . Codeine Nausea And Vomiting    Reaction:Hallucinations  . Nitrofurantoin Nausea Only and Other (See Comments)    REACTION: Hallucinations  . Orphenadrine Citrate Nausea And Vomiting and Other (See Comments)    REACTION: Hallucination  . Pentosan Polysulfate Sodium Rash and Other (See Comments)    REACTION: rash/ severe headache    Family History:  Family History  Problem Relation Age of Onset  . Lung cancer Father   . Heart attack Mother   . Lymphoma Mother     s/p chemo  . Stroke Mother     x 2  . Diabetes Brother   . Colon cancer Neg Hx   . Breast cancer Maternal Aunt       Prior to Admission medications   Medication Sig Start Date End Date Taking? Authorizing Provider  amLODipine (NORVASC) 5 MG tablet Take 5 mg by mouth daily.   Yes Historical Provider, MD  aspirin EC 81 MG tablet Take 81 mg by mouth every morning.   Yes Historical Provider, MD  clopidogrel (PLAVIX) 75 MG tablet Take 75 mg by mouth daily with breakfast.   Yes Historical Provider, MD  Coenzyme Q10 200 MG capsule Take 200 mg by mouth 2 (two) times daily.   Yes Historical Provider, MD  furosemide (LASIX) 40 MG tablet Take 40 mg by mouth daily as needed for edema.   Yes Historical Provider, MD  lansoprazole (PREVACID) 15 MG capsule Take 15 mg by mouth as needed (reflux).    Yes Historical Provider, MD  levothyroxine (SYNTHROID, LEVOTHROID) 100 MCG tablet Take 100 mcg by mouth daily before breakfast.    Yes Historical Provider, MD  losartan-hydrochlorothiazide (HYZAAR) 100-25 MG per tablet Take 1 tablet by mouth every morning.    Yes Historical Provider, MD  nitroGLYCERIN (NITROSTAT) 0.4 MG SL tablet Place 0.4 mg under the tongue every 5 (five) minutes as needed. For chest pain   Yes Historical Provider, MD  Omega-3 Fatty Acids (FISH OIL BURP-LESS) 500 MG CAPS Take 1,000 mg by mouth 2 (two) times daily.    Yes Historical  Provider, MD  Pumpkin Seed-Soy Germ (AZO BLADDER CONTROL/GO-LESS) CAPS Take 2 capsules by mouth daily as needed (for spasms).   Yes Historical Provider, MD  simvastatin (ZOCOR) 10 MG tablet Take 10 mg by mouth daily at 6 PM.   Yes Historical Provider, MD    Physical Exam: Filed Vitals:   07/27/13 2215 07/27/13 2230 07/27/13 2245 07/28/13 0143  BP: 108/58 123/68 121/54 163/79  Pulse: 59 59 60 59  Temp:    98.9 F (37.2 C)  TempSrc:  Oral  Resp: 16 17 12 18   Height:    5' (1.524 m)  Weight:    94.1 kg (207 lb 7.3 oz)  SpO2: 95% 97% 96% 95%     General:  Well-developed and nourished.  Eyes: Anicteric no pallor.  ENT: No discharge from the ears eyes nose mouth.  Neck: No mass felt.  Cardiovascular: S1-S2 heard.  Respiratory: No rhonchi or crepitations.  Abdomen: Soft nontender bowel sounds present. No guarding rigidity.  Skin: No rash.  Musculoskeletal: No edema.  Psychiatric: Appears normal.  Neurologic: Alert awake oriented to time place and person. Moves all extremities.  Labs on Admission:  Basic Metabolic Panel:  Recent Labs Lab 07/27/13 1703 07/27/13 1718  NA 144 142  K 3.5* 3.1*  CL 104 104  CO2 25  --   GLUCOSE 204* 215*  BUN 19 19  CREATININE 1.13* 1.30*  CALCIUM 9.5  --    Liver Function Tests:  Recent Labs Lab 07/27/13 1703  AST 22  ALT 21  ALKPHOS 58  BILITOT 0.4  PROT 6.8  ALBUMIN 3.8   No results found for this basename: LIPASE, AMYLASE,  in the last 168 hours No results found for this basename: AMMONIA,  in the last 168 hours CBC:  Recent Labs Lab 07/27/13 1703 07/27/13 1718  WBC 9.0  --   NEUTROABS 6.0  --   HGB 14.6 15.3*  HCT 42.5 45.0  MCV 88.2  --   PLT 234  --    Cardiac Enzymes: No results found for this basename: CKTOTAL, CKMB, CKMBINDEX, TROPONINI,  in the last 168 hours  BNP (last 3 results)  Recent Labs  02/27/13 2341  PROBNP 95.0   CBG: No results found for this basename: GLUCAP,  in the last 168  hours  Radiological Exams on Admission: Ct Angio Chest Pe W/cm &/or Wo Cm  07/27/2013   CLINICAL DATA:  Sudden onset of chest pain with shortness of breath associated with nausea and diaphoresis  EXAM: CT ANGIOGRAPHY CHEST WITH CONTRAST  TECHNIQUE: Multidetector CT imaging of the chest was performed using the standard protocol during bolus administration of intravenous contrast. Multiplanar CT image reconstructions and MIPs were obtained to evaluate the vascular anatomy.  CONTRAST:  100mL OMNIPAQUE IOHEXOL 350 MG/ML SOLN intravenously  COMPARISON:  Portable chest x-ray of July 27, 2013  FINDINGS: Contrast within the pulmonary arterial tree is normal. There are no filling defects to suggest an acute pulmonary embolism. The caliber of the thoracic aorta is normal. No false lumen is demonstrated. The cardiac chambers are top-normal in size. There are dense coronary artery calcifications. There is no pleural nor pericardial effusion. There is no mediastinal nor hilar lymphadenopathy. There is a small hiatal hernia.  There is a small amount of apical pleural scarring bilaterally. There is there is increased density posteriorly in the right lower lobe. There are no pulmonary parenchymal nodules nor alveolar infiltrates.  Within the upper abdomen the observed portions of the liver and spleen are normal. A tiny amount of intrahepatic gas is consistent with previous cholecystectomy. The stomach is moderately distended with food. The thoracic spine exhibits mild degenerative disc change at multiple levels. The observed portions of the rib cage are normal. There is no retrosternal abnormality.  Review of the MIP images confirms the above findings.  IMPRESSION: 1. There is no acute pulmonary embolism nor acute thoracic aortic pathology. 2. There is no evidence of CHF. There is coronary artery calcification. 3. There is right lower  lobe atelectasis or early pneumonia. 4. There is no lymphadenopathy nor pleural effusion.    Electronically Signed   By: David  [place]   On: 07/27/2013 20:02   Dg Chest Port 1 View  07/27/2013   CLINICAL DATA:  Chest pain, shortness of breath dizziness.  EXAM: PORTABLE CHEST - 1 VIEW  COMPARISON:  Single view of the chest 02/27/2013 and PA and lateral chest 11/12/2011.  FINDINGS: The lungs are clear. Heart size is normal. No pneumothorax or pleural effusion is identified. Dual lead pacing device is again seen and appears unchanged.  IMPRESSION: No acute abnormality.   Electronically Signed   By: Drusilla Kanner M.D.   On: 07/27/2013 17:23    EKG: Independently reviewed. Abnormal EKG with broad QRS complexes.  Assessment/Plan Principal Problem:   Chest pain Active Problems:   HYPERTENSION, MALIGNANT ESSENTIAL   ESSENTIAL HYPERTENSION   CAD, NATIVE VESSEL   Unstable angina   1. Chest pain concerning for unstable angina - given patient's history of CAD status post stenting at this time patient will be kept n.p.o. except medications and on heparin infusion and nitroglycerin patch. Aspirin. Cycle cardiac markers. When necessary morphine. Consult cardiology. 2. Hyperglycemia - concerning for new-onset diabetes. Check hemoglobin A1c. 3. Mild renal insufficiency - closely follow intake output and metabolic panel. 4. Hypertension - continue home medications. 5. Hypothyroidism - continue Synthroid. 6. Hyperlipidemia - continue home medications. 7. History of pacemaker placement for AV block.  Patient's CT angiogram of the chest were showing possible pneumonia. Patient has been placed on Levaquin and procalcitonin ordered and if procalcitonin is negative discontinue Levaquin.    Code Status: Full code.  Family Communication: None.  Disposition Plan: Admit to inpatient.    KAKRAKANDY,ARSHAD N. [facility] Pager [phone].  If 7PM-7AM, please contact night-coverage [email or web address] Password [email or web address] 07/28/2013, 2:25 AM

## 2013-07-28 NOTE — Progress Notes (Signed)
Note: This document was prepared with digital dictation and possible smart phrase technology. Any transcriptional errors that result from this process are unintentional.   Hal HopeConnie R Pekala NGE:[id number] DOB: 1940-06-29 DOA: 07/27/2013 PCP: Crawford GivensGraham [name], MD  Brief narrative:  73 y/o ?, known h/o CAD-DES 2011-admitted for UA 08/11/11 with neg cardiac cath-myoview 02/2013 no ischemia, AV block s/p ppm 112013, ? 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 States CP ~ 6 months ago.  States had shortness of breath started insidiously and was not able to do her 1 mile on her treadmill. Over the past couple of months this is decreased to half a mile. She started to have increasing dyspnea with levine's sign.  On 7/22 she felt dyspneic had a heaviness in her chest and was diaphoretic while in her carport. After using treadmill 7/23, she became short of breath dyspneic and felt uncomfortable. She describes the discomfort as a 10 on 10 no acute pain but heaviness. She was admitted and placed on nitroglycerin as well as heparin IV and cardiology was consulted She will be getting a cardiac catheterization later today  Past medical history-As per Problem list Chart reviewed as below- reviewed  Consultants:  cardiology  Procedures:  None yet  Antibiotics:  none   Subjective   Pleasant alert oriented no apparent distress States pain is 2/10 currently No nausea no vomiting Got up to the bathroom early this morning short of breath and felt diaphoretic   Objective    Interim History: none  Telemetry: Pacemaker on demand pacing   Objective: Filed Vitals:   07/28/13 0143 07/28/13 0526 07/28/13 0900 07/28/13 1055  BP: 163/79 153/86 149/74 145/66  Pulse: 59 73 59 62  Temp: 98.9 F (37.2 C) 98.3 F (36.8 C) 97.4 F (36.3 C) 98.4 F (36.9 C)  TempSrc: Oral Oral Oral Oral  Resp: 18 18 16 18   Height: 5'  (1.524 m)     Weight: 94.1 kg (207 lb 7.3 oz)     SpO2: 95% 94% 92% 93%    Intake/Output Summary (Last 24 hours) at 07/28/13 1305 Last data filed at 07/28/13 0841  Gross per 24 hour  Intake      0 ml  Output      0 ml  Net      0 ml    Exam:  General: alert pleasant Cardiovascular: S1-S2 distant heart sounds Respiratory: clinically clear no added sound Abdomen: no rebound or guarding Skinno lower extremity edema Neurointact  Data Reviewed: Basic Metabolic Panel:  Recent Labs Lab 07/27/13 1703 07/27/13 1718 07/28/13 0500  NA 144 142 148*  K 3.5* 3.1* 4.7  CL 104 104 108  CO2 25  --  30  GLUCOSE 204* 215* 96  BUN 19 19 16   CREATININE 1.13* 1.30* 0.98  CALCIUM 9.5  --  8.6   Liver Function Tests:  Recent Labs Lab 07/27/13 1703 07/28/13 0500  AST 22 16  ALT 21 17  ALKPHOS 58 48  BILITOT 0.4 0.4  PROT 6.8 5.9*  ALBUMIN 3.8 3.3*   No results found for this basename: LIPASE, AMYLASE,  in the last 168 hours No results found for this basename: AMMONIA,  in the last 168 hours CBC:  Recent Labs Lab 07/27/13 1703 07/27/13 1718 07/28/13 0442 07/28/13 0500  WBC 9.0  --  8.9 PENDING  NEUTROABS 6.0  --  5.5 DUPLICATE REQUEST  HGB 14.6 15.3* 13.4 PENDING  HCT 42.5 45.0 40.8 PENDING  MCV 88.2  --  90.3 PENDING  PLT 234  --  217 PENDING   Cardiac Enzymes:  Recent Labs Lab 07/28/13 0500 07/28/13 0840  TROPONINI <0.30 <0.30   BNP: No components found with this basename: POCBNP,  CBG: No results found for this basename: GLUCAP,  in the last 168 hours  No results found for this or any previous visit (from the past 240 hour(s)).   Studies:              All Imaging reviewed and is as per above notation   Scheduled Meds: . [COMPLETED] sodium chloride   Intravenous STAT  . amLODipine  5 mg Oral Daily  . aspirin  81 mg Oral Pre-Cath  . aspirin EC  81 mg Oral q morning - 10a  . clopidogrel  75 mg Oral Q breakfast  . losartan  100 mg Oral Daily   And    . hydrochlorothiazide  25 mg Oral Daily  . [START ON 07/29/2013] levofloxacin (LEVAQUIN) IV  750 mg Intravenous Q24H  . levothyroxine  100 mcg Oral QAC breakfast  . nitroGLYCERIN  0.4 mg Transdermal Q24H  . omega-3 acid ethyl esters  1,000 mg Oral BID  . pantoprazole  40 mg Oral Daily  . simvastatin  10 mg Oral q1800  . sodium chloride  3 mL Intravenous Q12H  . sodium chloride  3 mL Intravenous Q12H  . sodium chloride  3 mL Intravenous Q12H   Continuous Infusions: . [START ON 07/29/2013] sodium chloride    . heparin 900 Units/hr (07/28/13 0201)     Assessment/Plan: 1. Unstable angina in setting of known prior stent -crescendo decrescendo pattern seems to imply this-got relief from morphine IV.  for definitive cardiac catheterization today and further recommendations subsequently as per cardiology-continue IV heparin, nitro paste 2. Prior CVA 2012-continue dual antiplatelet therapy aspirin/Plavix 75 3. AV block status post PPM 11/2011-pacemaker interrogated recently and reviewed by cardiology PA. V. pacing was appropriate at the time. 4. Hyperglycemia-potentially stress response. A1c is pending 5. Chest x-ray findings incompatible with actual pneumonia-CT chest for pulmonary embolism is negative and showed atelectasis right lung. Levaquin discontinued. 6. Hypertension-continue losartan 100 daily, amlodipine 5 daily 7. Hypothyroidism continue Synthroid100 mcg every morning 8. Acute kidney injury-BUN/creatinine,19/1.3 on admission-baseline 15/0.8. Hydrate cautiously IV saline as per cardiology. I have also discontinued HCTZ and when necessary Lasix for right now 9. Mild hypernatremia 10. Hypertriglyceridemia-continue zocor as OP  Code Status: full Family Communication: discussed in detail for 20 minutes with family at bedside Disposition Plan: inpatient   Pleas Koch, MD  [facility] Pager [phone] 07/28/2013, 1:05 PM    LOS: 1 day

## 2013-07-28 NOTE — Consult Note (Signed)
Primary cardiologist: CM  HPI: 73 yo WF for evaluation of chest pain. H/o CAD with DES RCA in June 2011, HTN, hyperlipidemia, hypothyroidism, AV block s/p PPM, CVA. Left heart cath on 06/13/09; severe stenosis in the RCA. There was minor disease in the LAD and Circumflex. PCI on 06/19/09 with single DES in the RCA. Repeat cath 8/13 showed stable disease. Has had pacemaker placed for bradycardia. Stress myoview 2/15 with no ischemia, LVEF-79%. Patient states that for the past 3 weeks she has had chest pain with exertion relieved with rest. It is described as a heaviness without radiation. There is nausea, diaphoresis and dyspnea. She had more severe episode yesterday and has been noted. Cardiology asked to evaluate. She has dyspnea on exertion but no orthopnea or PND. No syncope.   Medications Prior to Admission  Medication Sig Dispense Refill  . amLODipine (NORVASC) 5 MG tablet Take 5 mg by mouth daily.      Marland Kitchen aspirin EC 81 MG tablet Take 81 mg by mouth every morning.      . clopidogrel (PLAVIX) 75 MG tablet Take 75 mg by mouth daily with breakfast.      . Coenzyme Q10 200 MG capsule Take 200 mg by mouth 2 (two) times daily.      . furosemide (LASIX) 40 MG tablet Take 40 mg by mouth daily as needed for edema.      . lansoprazole (PREVACID) 15 MG capsule Take 15 mg by mouth as needed (reflux).       Marland Kitchen levothyroxine (SYNTHROID, LEVOTHROID) 100 MCG tablet Take 100 mcg by mouth daily before breakfast.       . losartan-hydrochlorothiazide (HYZAAR) 100-25 MG per tablet Take 1 tablet by mouth every morning.       . nitroGLYCERIN (NITROSTAT) 0.4 MG SL tablet Place 0.4 mg under the tongue every 5 (five) minutes as needed. For chest pain      . Omega-3 Fatty Acids (FISH OIL BURP-LESS) 500 MG CAPS Take 1,000 mg by mouth 2 (two) times daily.       . Pumpkin Seed-Soy Germ (AZO BLADDER CONTROL/GO-LESS) CAPS Take 2 capsules by mouth daily as needed (for spasms).      . simvastatin (ZOCOR) 10 MG tablet Take 10  mg by mouth daily at 6 PM.        Allergies  Allergen Reactions  . Anesthetics, Amide Other (See Comments)    REACTION:FEVER, SYNCOPE  . Codeine Nausea And Vomiting    Reaction:Hallucinations  . Nitrofurantoin Nausea Only and Other (See Comments)    REACTION: Hallucinations  . Orphenadrine Citrate Nausea And Vomiting and Other (See Comments)    REACTION: Hallucination  . Pentosan Polysulfate Sodium Rash and Other (See Comments)    REACTION: rash/ severe headache    Past Medical History  Diagnosis Date  . Hypothyroidism 1990  . Labile hypertension 08/1991  . HLD (hyperlipidemia) 01/1997  . Fibromyalgia   . Bladder spasms   . Basilar artery stenosis     90%  . CAD (coronary artery disease)     a. 06/2009 cath: severe stenosis RCA with placement drug eluting stent;  b. 08/2011 cath: normal left main, luminal LAD irregularities, luminal LCx irregularities, luminal RCA irregularities and no ISR to mid-RCA DES, LVEF 60-65%; c. 02/2013 nonischemic CL, EF 79%.  . IC (interstitial cystitis)   . Obesity   . Hypertension   . Blood transfusion 1960's    w/childbirth  . CVA (cerebral infarction) December 2012  .  Pacemaker 11/11/2011    dual chamber pacemaker  . Second degree Mobitz II AV block   . Family history of anesthesia complication     "first cousin had very fever and almost died" (03-21-2013)  . Malignant hyperthermia     "told to warn everybody before I had surgery that my 1st cousin had malignant hyperthermia; I've had symptoms of it too" (2013/03/21)  . Pneumonia     "twice" (03-21-13)  . Stroke     "they tell me I've had some mini strokes; nothing that's bothered me though" (03-21-2013)  . Arthritis     "fingers" (March 21, 2013)    Past Surgical History  Procedure Laterality Date  . Bladder suspension  1974  . Cholecystectomy  1985  . Cardiac catheterization  1993    normal  . Renal angioplasty  1993    normal  . Carotid US  11/93    Occl L comm carotid  . Visual evoked  response  11/93; 1/01    normal  . US renal/aorta  12/93    Normal  . Emg  1/01    LE, RUE normal  . Carotid US  4/98    L comm occl R-ok  . Carotid US  01/30/03    L occl  R less 40%  . Dexa  10/15/04    Troch -0.4 o/w pos  . Carotid arteriogram      bilat  . Bilateral vert & subclavian angiogram  07/19/08    85-90% stenosis mid basilar art; occluded left vert artery; occluded left comm carotid artery w/ collat flow  . Basilar artery angioplasty  09/04/08    sm right brachial hematoma  . Cath single vess dz  06/13/09    70% stenosis RCA EF 65-70%  . Coronary angioplasty with stent placement  06/19/09  . Cataract extraction w/ intraocular lens implant Bilateral 2007-2008  . Eye surgery    . Vaginal hysterectomy  1974    dysmennorhea  . Insert / replace / remove pacemaker  11/11/2011    MDT Adapta L implanted by Dr Rayann Heman. [employer]    History   Social History  . Marital Status: Married    Spouse Name: N/A    Number of Children: 1  . Years of Education: N/A   Occupational History  . Bookkeeper-part time    Social History Main Topics  . Smoking status: Never Smoker   . Smokeless tobacco: Never Used  . Alcohol Use: No  . Drug Use: No  . Sexual Activity: Not Currently    Birth Control/ Protection: Post-menopausal   Other Topics Concern  . 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 Lung cancer Father   . Heart attack Mother   . Lymphoma Mother     s/p chemo  . Stroke Mother     x 2  . Diabetes Brother   . Colon cancer Neg Hx   . Breast cancer Maternal Aunt     ROS:  no fevers or chills, productive cough, hemoptysis, dysphasia, odynophagia, melena, hematochezia, dysuria, hematuria, rash, seizure activity, orthopnea, PND, pedal edema, claudication. Remaining systems are negative.  Physical Exam:   Blood  pressure 145/66, pulse 62, temperature 98.4 F (36.9 C), temperature source Oral, resp. rate 18, height 5' (1.524 m), weight 207 lb 7.3 oz (  94.1 kg), SpO2 93.00%.  General:  Well developed/obese in NAD Skin warm/dry Patient not depressed No peripheral clubbing Back-normal HEENT-normal/normal eyelids Neck supple/normal carotid upstroke bilaterally; no bruits; no JVD; no thyromegaly chest - CTA/ normal expansion CV - RRR/normal S1 and S2; no rubs or gallops;  PMI nondisplaced, 2/6 systolic murmur LSB Abdomen -NT/ND, no HSM, no mass, + bowel sounds, no bruit 2+ femoral pulses, no bruits Ext-no edema, chords, 2+ DP Neuro-grossly nonfocal  ECG Ventricular pacing.  Results for orders placed during the hospital encounter of 07/27/13 (from the past 48 hour(s))  CBC WITH DIFFERENTIAL     Status: None   Collection Time    07/27/13  5:03 PM      Result Value Ref Range   WBC 9.0  4.0 - 10.5 K/uL   RBC 4.82  3.87 - 5.11 MIL/uL   Hemoglobin 14.6  12.0 - 15.0 g/dL   HCT 42.5  36.0 - 46.0 %   MCV 88.2  78.0 - 100.0 fL   MCH 30.3  26.0 - 34.0 pg   MCHC 34.4  30.0 - 36.0 g/dL   RDW 13.2  11.5 - 15.5 %   Platelets 234  150 - 400 K/uL   Neutrophils Relative % 68  43 - 77 %   Neutro Abs 6.0  1.7 - 7.7 K/uL   Lymphocytes Relative 23  12 - 46 %   Lymphs Abs 2.1  0.7 - 4.0 K/uL   Monocytes Relative 7  3 - 12 %   Monocytes Absolute 0.6  0.1 - 1.0 K/uL   Eosinophils Relative 2  0 - 5 %   Eosinophils Absolute 0.2  0.0 - 0.7 K/uL   Basophils Relative 0  0 - 1 %   Basophils Absolute 0.0  0.0 - 0.1 K/uL  COMPREHENSIVE METABOLIC PANEL     Status: Abnormal   Collection Time    07/27/13  5:03 PM      Result Value Ref Range   Sodium 144  137 - 147 mEq/L   Potassium 3.5 (*) 3.7 - 5.3 mEq/L   Chloride 104  96 - 112 mEq/L   CO2 25  19 - 32 mEq/L   Glucose, Bld 204 (*) 70 - 99 mg/dL   BUN 19  6 - 23 mg/dL   Creatinine, Ser 1.13 (*) 0.50 - 1.10 mg/dL   Calcium 9.5  8.4 - 10.5 mg/dL   Total Protein  6.8  6.0 - 8.3 g/dL   Albumin 3.8  3.5 - 5.2 g/dL   AST 22  0 - 37 U/L   ALT 21  0 - 35 U/L   Alkaline Phosphatase 58  39 - 117 U/L   Total Bilirubin 0.4  0.3 - 1.2 mg/dL   GFR calc non Af Amer 48 (*) >90 mL/min   GFR calc Af Amer 56 (*) >90 mL/min   Comment: (NOTE)     The eGFR has been calculated using the CKD EPI equation.     This calculation has not been validated in all clinical situations.     eGFR's persistently <90 mL/min signify possible Chronic Kidney     Disease.   Anion gap 15  5 - 15  I-STAT TROPOININ, ED     Status: None   Collection Time    07/27/13  5:16 PM      Result Value Ref Range   Troponin i, poc 0.01  0.00 - 0.08 ng/mL   Comment 3  Comment: Due to the release kinetics of cTnI,     a negative result within the first hours     of the onset of symptoms does not rule out     myocardial infarction with certainty.     If myocardial infarction is still suspected,     repeat the test at appropriate intervals.  I-STAT CHEM 8, ED     Status: Abnormal   Collection Time    07/27/13  5:18 PM      Result Value Ref Range   Sodium 142  137 - 147 mEq/L   Potassium 3.1 (*) 3.7 - 5.3 mEq/L   Chloride 104  96 - 112 mEq/L   BUN 19  6 - 23 mg/dL   Creatinine, Ser 1.30 (*) 0.50 - 1.10 mg/dL   Glucose, Bld 215 (*) 70 - 99 mg/dL   Calcium, Ion 1.18  1.13 - 1.30 mmol/L   TCO2 24  0 - 100 mmol/L   Hemoglobin 15.3 (*) 12.0 - 15.0 g/dL   HCT 45.0  36.0 - 46.0 %  I-STAT TROPOININ, ED     Status: None   Collection Time    07/27/13  8:19 PM      Result Value Ref Range   Troponin i, poc 0.02  0.00 - 0.08 ng/mL   Comment 3            Comment: Due to the release kinetics of cTnI,     a negative result within the first hours     of the onset of symptoms does not rule out     myocardial infarction with certainty.     If myocardial infarction is still suspected,     repeat the test at appropriate intervals.  CBC     Status: None   Collection Time    07/28/13  4:42 AM       Result Value Ref Range   WBC 8.9  4.0 - 10.5 K/uL   RBC 4.52  3.87 - 5.11 MIL/uL   Hemoglobin 13.4  12.0 - 15.0 g/dL   HCT 40.8  36.0 - 46.0 %   MCV 90.3  78.0 - 100.0 fL   MCH 29.6  26.0 - 34.0 pg   MCHC 32.8  30.0 - 36.0 g/dL   RDW 13.5  11.5 - 15.5 %   Platelets 217  150 - 400 K/uL  DIFFERENTIAL     Status: None   Collection Time    07/28/13  4:42 AM      Result Value Ref Range   Neutrophils Relative % 63  43 - 77 %   Neutro Abs 5.5  1.7 - 7.7 K/uL   Lymphocytes Relative 26  12 - 46 %   Lymphs Abs 2.3  0.7 - 4.0 K/uL   Monocytes Relative 9  3 - 12 %   Monocytes Absolute 0.8  0.1 - 1.0 K/uL   Eosinophils Relative 2  0 - 5 %   Eosinophils Absolute 0.2  0.0 - 0.7 K/uL   Basophils Relative 0  0 - 1 %   Basophils Absolute 0.0  0.0 - 0.1 K/uL  TROPONIN I     Status: None   Collection Time    07/28/13  5:00 AM      Result Value Ref Range   Troponin I <0.30  <0.30 ng/mL   Comment:            Due to the release kinetics of cTnI,  a negative result within the first hours     of the onset of symptoms does not rule out     myocardial infarction with certainty.     If myocardial infarction is still suspected,     repeat the test at appropriate intervals.  COMPREHENSIVE METABOLIC PANEL     Status: Abnormal   Collection Time    07/28/13  5:00 AM      Result Value Ref Range   Sodium 148 (*) 137 - 147 mEq/L   Potassium 4.7  3.7 - 5.3 mEq/L   Comment: DELTA CHECK NOTED   Chloride 108  96 - 112 mEq/L   CO2 30  19 - 32 mEq/L   Glucose, Bld 96  70 - 99 mg/dL   BUN 16  6 - 23 mg/dL   Creatinine, Ser 0.98  0.50 - 1.10 mg/dL   Calcium 8.6  8.4 - 10.5 mg/dL   Total Protein 5.9 (*) 6.0 - 8.3 g/dL   Albumin 3.3 (*) 3.5 - 5.2 g/dL   AST 16  0 - 37 U/L   ALT 17  0 - 35 U/L   Alkaline Phosphatase 48  39 - 117 U/L   Total Bilirubin 0.4  0.3 - 1.2 mg/dL   GFR calc non Af Amer 57 (*) >90 mL/min   GFR calc Af Amer 66 (*) >90 mL/min   Comment: (NOTE)     The eGFR has been  calculated using the CKD EPI equation.     This calculation has not been validated in all clinical situations.     eGFR's persistently <90 mL/min signify possible Chronic Kidney     Disease.   Anion gap 10  5 - 15  CBC WITH DIFFERENTIAL     Status: None   Collection Time    07/28/13  5:00 AM      Result Value Ref Range   WBC PENDING     RBC PENDING     Hemoglobin PENDING     HCT PENDING     MCV PENDING     MCH PENDING     MCHC PENDING     RDW PENDING     Platelets PENDING     Neutrophils Relative % DUPLICATE REQUEST     Neutro Abs DUPLICATE REQUEST     Band Neutrophils DUPLICATE REQUEST     Lymphocytes Relative DUPLICATE REQUEST     Lymphs Abs DUPLICATE REQUEST     Monocytes Relative DUPLICATE REQUEST     Monocytes Absolute DUPLICATE REQUEST     Eosinophils Relative DUPLICATE REQUEST     Eosinophils Absolute DUPLICATE REQUEST     Basophils Relative DUPLICATE REQUEST     Basophils Absolute DUPLICATE REQUEST     LUCs, % DUPLICATE REQUEST     LUC, Absolute DUPLICATE REQUEST     WBC Morphology DUPLICATE REQUEST     RBC Morphology DUPLICATE REQUEST     Smear Review DUPLICATE REQUEST     Other DUPLICATE REQUEST     Other 2 DUPLICATE REQUEST     nRBC DUPLICATE REQUEST     Metamyelocytes Relative DUPLICATE REQUEST     Myelocytes DUPLICATE REQUEST     Promyelocytes Absolute DUPLICATE REQUEST     Blasts DUPLICATE REQUEST    PROCALCITONIN     Status: None   Collection Time    07/28/13  5:00 AM      Result Value Ref Range   Procalcitonin <0.10     Comment:  Interpretation:     PCT (Procalcitonin) <= 0.5 ng/mL:     Systemic infection (sepsis) is not likely.     Local bacterial infection is possible.     RESULT REPEATED AND VERIFIED     (NOTE)             ICU PCT Algorithm               Non ICU PCT Algorithm        ----------------------------     ------------------------------             PCT < 0.25 ng/mL                 PCT < 0.1 ng/mL         Stopping of  antibiotics            Stopping of antibiotics           strongly encouraged.               strongly encouraged.        ----------------------------     ------------------------------           PCT level decrease by               PCT < 0.25 ng/mL           >= 80% from peak PCT           OR PCT 0.25 - 0.5 ng/mL          Stopping of antibiotics                                                 encouraged.         Stopping of antibiotics               encouraged.        ----------------------------     ------------------------------           PCT level decrease by              PCT >= 0.25 ng/mL           < 80% from peak PCT            AND PCT >= 0.5 ng/mL            Continuing antibiotics                                                  encouraged.           Continuing antibiotics                encouraged.        ----------------------------     ------------------------------         PCT level increase compared          PCT > 0.5 ng/mL             with peak PCT AND              PCT >= 0.5 ng/mL             Escalation of antibiotics  strongly encouraged.          Escalation of antibiotics            strongly encouraged.  TROPONIN I     Status: None   Collection Time    07/28/13  8:40 AM      Result Value Ref Range   Troponin I <0.30  <0.30 ng/mL   Comment:            Due to the release kinetics of cTnI,     a negative result within the first hours     of the onset of symptoms does not rule out     myocardial infarction with certainty.     If myocardial infarction is still suspected,     repeat the test at appropriate intervals.  HEPARIN LEVEL (UNFRACTIONATED)     Status: None   Collection Time    07/28/13 10:10 AM      Result Value Ref Range   Heparin Unfractionated 0.37  0.30 - 0.70 IU/mL   Comment:            IF HEPARIN RESULTS ARE BELOW     EXPECTED VALUES, AND PATIENT     DOSAGE HAS BEEN CONFIRMED,     SUGGEST FOLLOW UP TESTING      OF ANTITHROMBIN III LEVELS.    Ct Angio Chest Pe W/cm &/or Wo Cm  07/27/2013   CLINICAL DATA:  Sudden onset of chest pain with shortness of breath associated with nausea and diaphoresis  EXAM: CT ANGIOGRAPHY CHEST WITH CONTRAST  TECHNIQUE: Multidetector CT imaging of the chest was performed using the standard protocol during bolus administration of intravenous contrast. Multiplanar CT image reconstructions and MIPs were obtained to evaluate the vascular anatomy.  CONTRAST:  16mL OMNIPAQUE IOHEXOL 350 MG/ML SOLN intravenously  COMPARISON:  Portable chest x-ray of July 27, 2013  FINDINGS: Contrast within the pulmonary arterial tree is normal. There are no filling defects to suggest an acute pulmonary embolism. The caliber of the thoracic aorta is normal. No false lumen is demonstrated. The cardiac chambers are top-normal in size. There are dense coronary artery calcifications. There is no pleural nor pericardial effusion. There is no mediastinal nor hilar lymphadenopathy. There is a small hiatal hernia.  There is a small amount of apical pleural scarring bilaterally. There is there is increased density posteriorly in the right lower lobe. There are no pulmonary parenchymal nodules nor alveolar infiltrates.  Within the upper abdomen the observed portions of the liver and spleen are normal. A tiny amount of intrahepatic gas is consistent with previous cholecystectomy. The stomach is moderately distended with food. The thoracic spine exhibits mild degenerative disc change at multiple levels. The observed portions of the rib cage are normal. There is no retrosternal abnormality.  Review of the MIP images confirms the above findings.  IMPRESSION: 1. There is no acute pulmonary embolism nor acute thoracic aortic pathology. 2. There is no evidence of CHF. There is coronary artery calcification. 3. There is right lower lobe atelectasis or early pneumonia. 4. There is no lymphadenopathy nor pleural effusion.    Electronically Signed   By: David  Martinique   On: 07/27/2013 20:02   Dg Chest Port 1 View  07/27/2013   CLINICAL DATA:  Chest pain, shortness of breath dizziness.  EXAM: PORTABLE CHEST - 1 VIEW  COMPARISON:  Single view of the chest 02/27/2013 and PA and lateral chest 11/12/2011.  FINDINGS: The lungs are clear. Heart size is normal. No  pneumothorax or pleural effusion is identified. Dual lead pacing device is again seen and appears unchanged.  IMPRESSION: No acute abnormality.   Electronically Signed   By: Inge Rise M.D.   On: 07/27/2013 17:23    Assessment/Plan 1 unstable angina-patient presents with chest pain with exertion relieved with rest. Symptoms sound concerning but also with atypical features. She had a nuclear study in February of this year it showed no ischemia. She has ruled out for myocardial infarction. Feel definitive evaluation is warranted. See with cardiac catheterization. The risks and benefits were discussed and she agrees to proceed. Note the patient had a CTA on admission to rule out pulmonary embolus which was negative. Would not perform ventriculogram because of dye load. 2 coronary artery disease-continue aspirin and statin. 3 history of pacemaker-device has been interrogated and is functioning appropriately. 4 hyperlipidemia-continue statin. 5 hypertension-continue preadmission medications.  Kirk Ruths MD 07/28/2013, 11:24 AM

## 2013-07-28 NOTE — ED Provider Notes (Signed)
CSN: [id number]     Arrival date & time 07/27/13  1636 History   First MD Initiated Contact with Patient 07/27/13 1645     Chief Complaint  Patient presents with  . Chest Pain  . Shortness of Breath  . Dizziness     (Consider location/radiation/quality/duration/timing/severity/associated sxs/prior Treatment) Patient is a 73 y.o. female presenting with chest pain, shortness of breath, and dizziness. The history is provided by the patient and the EMS personnel.  Chest Pain Associated symptoms: back pain, cough, dizziness, nausea and shortness of breath   Associated symptoms: no abdominal pain, no fever, no headache and not vomiting   Shortness of Breath Associated symptoms: chest pain and cough   Associated symptoms: no abdominal pain, no fever, no headaches and no vomiting   Dizziness Associated symptoms: chest pain, nausea and shortness of breath   Associated symptoms: no headaches and no vomiting    patient brought in by EMS. Patient with acute onset of substernal chest pressure radiating to the back at 2:30 in the afternoon. EMS treated her with nitroglycerin x2 no improvement. Pacemaker present and she had heart rates of 111-113. Pacemaker was placed in November of 2014. Patient has noted that for the past 3 weeks especially with exertion she would get some pressure in her chest. Patient stated that upon arrival the chest pain of was 7/10 at its worse it was 9/10. Patient did have a full aspirin at home prior to arrival.  Past Medical History  Diagnosis Date  . Hypothyroidism 1990  . Labile hypertension 08/1991  . HLD (hyperlipidemia) 01/1997  . Fibromyalgia   . Bladder spasms   . Basilar artery stenosis     90%  . CAD (coronary artery disease)     a. 06/2009 cath: severe stenosis RCA with placement drug eluting stent;  b. 08/2011 cath: normal left main, luminal LAD irregularities, luminal LCx irregularities, luminal RCA irregularities and no ISR to mid-RCA DES, LVEF 60-65%; c.  02/2013 nonischemic CL, EF 79%.  . IC (interstitial cystitis)   . Obesity   . Hypertension   . Blood transfusion 1960's    w/childbirth  . CVA (cerebral infarction) December 2012  . Pacemaker 11/11/2011    dual chamber pacemaker  . Second degree Mobitz II AV block   . Family history of anesthesia complication     "first cousin had very fever and almost died" (2013-03-24)  . Malignant hyperthermia     "told to warn everybody before I had surgery that my 1st cousin had malignant hyperthermia; I've had symptoms of it too" (03-24-13)  . Pneumonia     "twice" (03/24/2013)  . Stroke     "they tell me I've had some mini strokes; nothing that's bothered me though" (03/24/13)  . Arthritis     "fingers" (Mar 24, 2013)   Past Surgical History  Procedure Laterality Date  . Bladder suspension  1974  . Cholecystectomy  1985  . Cardiac catheterization  1993    normal  . Renal angioplasty  1993    normal  . Carotid US  11/93    Occl L comm carotid  . Visual evoked response  11/93; 1/01    normal  . US renal/aorta  12/93    Normal  . Emg  1/01    LE, RUE normal  . Carotid US  4/98    L comm occl R-ok  . Carotid US  01/30/03    L occl  R less 40%  . Dexa  10/15/04  Troch -0.4 o/w pos  . Carotid arteriogram      bilat  . Bilateral vert & subclavian angiogram  07/19/08    85-90% stenosis mid basilar art; occluded left vert artery; occluded left comm carotid artery w/ collat flow  . Basilar artery angioplasty  09/04/08    sm right brachial hematoma  . Cath single vess dz  06/13/09    70% stenosis RCA EF 65-70%  . Coronary angioplasty with stent placement  06/19/09  . Cataract extraction w/ intraocular lens implant Bilateral 2007-2008  . Eye surgery    . Vaginal hysterectomy  1974    dysmennorhea  . Insert / replace / remove pacemaker  11/11/2011    MDT Adapta L implanted by Dr Johney FrameAllred. [employer]   Family History  Problem Relation Age of Onset  . Lung cancer Father   . Heart attack  Mother   . Lymphoma Mother     s/p chemo  . Stroke Mother     x 2  . Diabetes Brother   . Colon cancer Neg Hx   . Breast cancer Maternal Aunt    History  Substance Use Topics  . Smoking status: Never Smoker   . Smokeless tobacco: Never Used  . Alcohol Use: No   OB History   Grav Para Term Preterm Abortions TAB SAB Ect Mult Living                 Review of Systems  Constitutional: Negative for fever.  HENT: Negative for congestion.   Eyes: Negative for visual disturbance.  Respiratory: Positive for cough and shortness of breath.   Cardiovascular: Positive for chest pain.  Gastrointestinal: Positive for nausea. Negative for vomiting and abdominal pain.  Genitourinary: Negative for dysuria.  Musculoskeletal: Positive for back pain.  Neurological: Positive for dizziness. Negative for headaches.  Hematological: Does not bruise/bleed easily.  Psychiatric/Behavioral: Negative for confusion.      Allergies  Anesthetics, amide; Codeine; Nitrofurantoin; Orphenadrine citrate; and Pentosan polysulfate sodium  Home Medications   Prior to Admission medications   Medication Sig Start Date End Date Taking? Authorizing Provider  amLODipine (NORVASC) 5 MG tablet Take 5 mg by mouth daily.   Yes Historical Provider, MD  aspirin EC 81 MG tablet Take 81 mg by mouth every morning.   Yes Historical Provider, MD  clopidogrel (PLAVIX) 75 MG tablet Take 75 mg by mouth daily with breakfast.   Yes Historical Provider, MD  Coenzyme Q10 200 MG capsule Take 200 mg by mouth 2 (two) times daily.   Yes Historical Provider, MD  furosemide (LASIX) 40 MG tablet Take 40 mg by mouth daily as needed for edema.   Yes Historical Provider, MD  lansoprazole (PREVACID) 15 MG capsule Take 15 mg by mouth as needed (reflux).    Yes Historical Provider, MD  levothyroxine (SYNTHROID, LEVOTHROID) 100 MCG tablet Take 100 mcg by mouth daily before breakfast.    Yes Historical Provider, MD  losartan-hydrochlorothiazide  (HYZAAR) 100-25 MG per tablet Take 1 tablet by mouth every morning.    Yes Historical Provider, MD  nitroGLYCERIN (NITROSTAT) 0.4 MG SL tablet Place 0.4 mg under the tongue every 5 (five) minutes as needed. For chest pain   Yes Historical Provider, MD  Omega-3 Fatty Acids (FISH OIL BURP-LESS) 500 MG CAPS Take 1,000 mg by mouth 2 (two) times daily.    Yes Historical Provider, MD  Pumpkin Seed-Soy Germ (AZO BLADDER CONTROL/GO-LESS) CAPS Take 2 capsules by mouth daily as needed (for spasms).  Yes Historical Provider, MD  simvastatin (ZOCOR) 10 MG tablet Take 10 mg by mouth daily at 6 PM.   Yes Historical Provider, MD   BP 121/54  Pulse 60  Temp(Src) 99.2 F (37.3 C) (Oral)  Resp 12  SpO2 96% Physical Exam  Nursing note and vitals reviewed. Constitutional: She is oriented to person, place, and time. She appears well-developed and well-nourished. She appears distressed.  HENT:  Head: Normocephalic and atraumatic.  Mouth/Throat: Oropharynx is clear and moist.  Eyes: Conjunctivae and EOM are normal. Pupils are equal, round, and reactive to light.  Neck: Normal range of motion.  Cardiovascular: Regular rhythm.   Tachycardic  Pulmonary/Chest: Effort normal and breath sounds normal. No respiratory distress.  Abdominal: Soft. Bowel sounds are normal. There is no tenderness.  Musculoskeletal: Normal range of motion.  Neurological: She is alert and oriented to person, place, and time. No cranial nerve deficit. She exhibits normal muscle tone. Coordination normal.  Skin: Skin is warm. No rash noted.    ED Course  Procedures (including critical care time) Labs Review Labs Reviewed  COMPREHENSIVE METABOLIC PANEL - Abnormal; Notable for the following:    Potassium 3.5 (*)    Glucose, Bld 204 (*)    Creatinine, Ser 1.13 (*)    GFR calc non Af Amer 48 (*)    GFR calc Af Amer 56 (*)    All other components within normal limits  I-STAT CHEM 8, ED - Abnormal; Notable for the following:     Potassium 3.1 (*)    Creatinine, Ser 1.30 (*)    Glucose, Bld 215 (*)    Hemoglobin 15.3 (*)    All other components within normal limits  CBC WITH DIFFERENTIAL  PRO B NATRIURETIC PEPTIDE  TROPONIN I  I-STAT TROPOININ, ED  I-STAT TROPOININ, ED    Imaging Review Ct Angio Chest Pe W/cm &/or Wo Cm  07/27/2013   CLINICAL DATA:  Sudden onset of chest pain with shortness of breath associated with nausea and diaphoresis  EXAM: CT ANGIOGRAPHY CHEST WITH CONTRAST  TECHNIQUE: Multidetector CT imaging of the chest was performed using the standard protocol during bolus administration of intravenous contrast. Multiplanar CT image reconstructions and MIPs were obtained to evaluate the vascular anatomy.  CONTRAST:  OMNIPAQUE IOHEXOL 350 MG/ML SOLN intravenously  COMPARISON:  Portable chest x-ray of July 27, 2013  FINDINGS: Contrast within the pulmonary arterial tree is normal. There are no filling defects to suggest an acute pulmonary embolism. The caliber of the thoracic aorta is normal. No false lumen is demonstrated. The cardiac chambers are top-normal in size. There are dense coronary artery calcifications. There is no pleural nor pericardial effusion. There is no mediastinal nor hilar lymphadenopathy. There is a small hiatal hernia.  There is a small amount of apical pleural scarring bilaterally. There is there is increased density posteriorly in the right lower lobe. There are no pulmonary parenchymal nodules nor alveolar infiltrates.  Within the upper abdomen the observed portions of the liver and spleen are normal. A tiny amount of intrahepatic gas is consistent with previous cholecystectomy. The stomach is moderately distended with food. The thoracic spine exhibits mild degenerative disc change at multiple levels. The observed portions of the rib cage are normal. There is no retrosternal abnormality.  Review of the MIP images confirms the above findings.  IMPRESSION: 1. There is no acute pulmonary  embolism nor acute thoracic aortic pathology. 2. There is no evidence of CHF. There is coronary artery calcification. 3.  There is right lower lobe atelectasis or early pneumonia. 4. There is no lymphadenopathy nor pleural effusion.   Electronically Signed   By: David  [place]   On: 07/27/2013 20:02   Dg Chest Port 1 View  07/27/2013   CLINICAL DATA:  Chest pain, shortness of breath dizziness.  EXAM: PORTABLE CHEST - 1 VIEW  COMPARISON:  Single view of the chest 02/27/2013 and PA and lateral chest 11/12/2011.  FINDINGS: The lungs are clear. Heart size is normal. No pneumothorax or pleural effusion is identified. Dual lead pacing device is again seen and appears unchanged.  IMPRESSION: No acute abnormality.   Electronically Signed   By: Drusilla Kanner M.D.   On: 07/27/2013 17:23     EKG Interpretation   Date/Time:  Thursday July 27 2013 16:38:36 EDT Ventricular Rate:  111 PR Interval:  105 QRS Duration: 162 QT Interval:  431 QTC Calculation: 586 R Axis:   -71 Text Interpretation:  Sinus or ectopic atrial tachycardia LVH with IVCD,  LAD and secondary repol abnrm Inferior infarct, acute (RCA) Anterolateral  infarct, old Prolonged QT interval Probable RV involvement, suggest  recording right precordial leads New since previous tracing Confirmed by  Ersie Savino  MD, Whitman Meinhardt [phone]) on 07/27/2013 4:47:25 PM     CRITICAL CARE Performed by: Vanetta Mulders Total critical care time: 30 Critical care time was exclusive of separately billable procedures and treating other patients. Critical care was necessary to treat or prevent imminent or life-threatening deterioration. Critical care was time spent personally by me on the following activities: development of treatment plan with patient and/or surrogate as well as nursing, discussions with consultants, evaluation of patient's response to treatment, examination of patient, obtaining history from patient or surrogate, ordering and performing treatments  and interventions, ordering and review of laboratory studies, ordering and review of radiographic studies, pulse oximetry and re-evaluation of patient's condition.     MDM   Final diagnoses:  Chest pain, unspecified chest pain type    Patient presented with a very unusual tachycardia but like a wide-complex tachycardia at a rate was only like 110 not consistent with V. tach based on the rate but based on the wide-complex looked a lot like V. tach. Cardiology was contacted and asked to give the opinion on the monitor and EKG changes. Patient with onset of significant chest pain at 2:30 in the afternoon substernal pressure much worse than anything that she's experienced before did radiate to her back. Patient is followed by the [facility]. Patient's pacemaker was interrogated it was consistent with a sinus tach. Patient has a dual-chamber pacemaker. Cardiology explained that if her heart rate once to go fast of the pacemaker we'll pace her at that still not completely clear why the complex was wide. It did not recommend any medications slower heart rate down. Recommended is working her up for the chest pain and treating her pain. Patient did not respond to nitroglycerin. Patient given morphine x2 a heart rate improved back down to a narrow complex with a rate of 68. Troponin x2 has been negative. However patient's still with some chest pressure. CT scan in [place] was negative for any significant findings. No evidence of pulmonary embolism no evidence of any thoracic aorta dissection. Patient will be admitted by the hospitalist team for rule out. We discussed with cardiology and wanted to the admission.    Vanetta Mulders, MD 07/28/13 [phone]

## 2013-07-28 NOTE — Progress Notes (Signed)
Pt's pacemaker was interrogated last night. Per discussion with [employer] rep, pacemaker was found to be functioning normally. The patient's intrinsic rate was higher but the device was functioning normally and V-pacing in response appropriately (like a sinus tach). There were no adverse events/rhythm issues. Pacemaker mediated tachycardia was ruled out. Pacer function was normal. Ronie Spiesayna Dunn PA-C

## 2013-07-28 NOTE — H&P (View-Only) (Signed)
Primary cardiologist: CM  HPI: 73 yo WF for evaluation of chest pain. H/o CAD with DES RCA in June 2011, HTN, hyperlipidemia, hypothyroidism, AV block s/p PPM, CVA. Left heart cath on 06/13/09; severe stenosis in the RCA. There was minor disease in the LAD and Circumflex. PCI on 06/19/09 with single DES in the RCA. Repeat cath 8/13 showed stable disease. Has had pacemaker placed for bradycardia. Stress myoview 2/15 with no ischemia, LVEF-79%. Patient states that for the past 3 weeks she has had chest pain with exertion relieved with rest. It is described as a heaviness without radiation. There is nausea, diaphoresis and dyspnea. She had more severe episode yesterday and has been noted. Cardiology asked to evaluate. She has dyspnea on exertion but no orthopnea or PND. No syncope.   Medications Prior to Admission  Medication Sig Dispense Refill  . amLODipine (NORVASC) 5 MG tablet Take 5 mg by mouth daily.      Marland Kitchen aspirin EC 81 MG tablet Take 81 mg by mouth every morning.      . clopidogrel (PLAVIX) 75 MG tablet Take 75 mg by mouth daily with breakfast.      . Coenzyme Q10 200 MG capsule Take 200 mg by mouth 2 (two) times daily.      . furosemide (LASIX) 40 MG tablet Take 40 mg by mouth daily as needed for edema.      . lansoprazole (PREVACID) 15 MG capsule Take 15 mg by mouth as needed (reflux).       Marland Kitchen levothyroxine (SYNTHROID, LEVOTHROID) 100 MCG tablet Take 100 mcg by mouth daily before breakfast.       . losartan-hydrochlorothiazide (HYZAAR) 100-25 MG per tablet Take 1 tablet by mouth every morning.       . nitroGLYCERIN (NITROSTAT) 0.4 MG SL tablet Place 0.4 mg under the tongue every 5 (five) minutes as needed. For chest pain      . Omega-3 Fatty Acids (FISH OIL BURP-LESS) 500 MG CAPS Take 1,000 mg by mouth 2 (two) times daily.       . Pumpkin Seed-Soy Germ (AZO BLADDER CONTROL/GO-LESS) CAPS Take 2 capsules by mouth daily as needed (for spasms).      . simvastatin (ZOCOR) 10 MG tablet Take 10  mg by mouth daily at 6 PM.        Allergies  Allergen Reactions  . Anesthetics, Amide Other (See Comments)    REACTION:FEVER, SYNCOPE  . Codeine Nausea And Vomiting    Reaction:Hallucinations  . Nitrofurantoin Nausea Only and Other (See Comments)    REACTION: Hallucinations  . Orphenadrine Citrate Nausea And Vomiting and Other (See Comments)    REACTION: Hallucination  . Pentosan Polysulfate Sodium Rash and Other (See Comments)    REACTION: rash/ severe headache    Past Medical History  Diagnosis Date  . Hypothyroidism 1990  . Labile hypertension 08/1991  . HLD (hyperlipidemia) 01/1997  . Fibromyalgia   . Bladder spasms   . Basilar artery stenosis     90%  . CAD (coronary artery disease)     a. 06/2009 cath: severe stenosis RCA with placement drug eluting stent;  b. 08/2011 cath: normal left main, luminal LAD irregularities, luminal LCx irregularities, luminal RCA irregularities and no ISR to mid-RCA DES, LVEF 60-65%; c. 02/2013 nonischemic CL, EF 79%.  . IC (interstitial cystitis)   . Obesity   . Hypertension   . Blood transfusion 1960's    w/childbirth  . CVA (cerebral infarction) December 2012  .  Pacemaker 11/11/2011    dual chamber pacemaker  . Second degree Mobitz II AV block   . Family history of anesthesia complication     "first cousin had very fever and almost died" (March 11, 2013)  . Malignant hyperthermia     "told to warn everybody before I had surgery that my 1st cousin had malignant hyperthermia; I've had symptoms of it too" (March 11, 2013)  . Pneumonia     "twice" (2013/03/11)  . Stroke     "they tell me I've had some mini strokes; nothing that's bothered me though" (2013-03-11)  . Arthritis     "fingers" (March 11, 2013)    Past Surgical History  Procedure Laterality Date  . Bladder suspension  1974  . Cholecystectomy  1985  . Cardiac catheterization  1993    normal  . Renal angioplasty  1993    normal  . Carotid US  11/93    Occl L comm carotid  . Visual evoked  response  11/93; 1/01    normal  . US renal/aorta  12/93    Normal  . Emg  1/01    LE, RUE normal  . Carotid US  4/98    L comm occl R-ok  . Carotid US  01/30/03    L occl  R less 40%  . Dexa  10/15/04    Troch -0.4 o/w pos  . Carotid arteriogram      bilat  . Bilateral vert & subclavian angiogram  07/19/08    85-90% stenosis mid basilar art; occluded left vert artery; occluded left comm carotid artery w/ collat flow  . Basilar artery angioplasty  09/04/08    sm right brachial hematoma  . Cath single vess dz  06/13/09    70% stenosis RCA EF 65-70%  . Coronary angioplasty with stent placement  06/19/09  . Cataract extraction w/ intraocular lens implant Bilateral 2007-2008  . Eye surgery    . Vaginal hysterectomy  1974    dysmennorhea  . Insert / replace / remove pacemaker  11/11/2011    MDT Adapta L implanted by Dr Rayann Heman. [employer]    History   Social History  . Marital Status: Married    Spouse Name: N/A    Number of Children: 1  . Years of Education: N/A   Occupational History  . Bookkeeper-part time    Social History Main Topics  . Smoking status: Never Smoker   . Smokeless tobacco: Never Used  . Alcohol Use: No  . Drug Use: No  . Sexual Activity: Not Currently    Birth Control/ Protection: Post-menopausal   Other Topics Concern  . 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 Lung cancer Father   . Heart attack Mother   . Lymphoma Mother     s/p chemo  . Stroke Mother     x 2  . Diabetes Brother   . Colon cancer Neg Hx   . Breast cancer Maternal Aunt     ROS:  no fevers or chills, productive cough, hemoptysis, dysphasia, odynophagia, melena, hematochezia, dysuria, hematuria, rash, seizure activity, orthopnea, PND, pedal edema, claudication. Remaining systems are negative.  Physical Exam:   Blood  pressure 145/66, pulse 62, temperature 98.4 F (36.9 C), temperature source Oral, resp. rate 18, height 5' (1.524 m), weight 207 lb 7.3 oz (  94.1 kg), SpO2 93.00%.  General:  Well developed/obese in NAD Skin warm/dry Patient not depressed No peripheral clubbing Back-normal HEENT-normal/normal eyelids Neck supple/normal carotid upstroke bilaterally; no bruits; no JVD; no thyromegaly chest - CTA/ normal expansion CV - RRR/normal S1 and S2; no rubs or gallops;  PMI nondisplaced, 2/6 systolic murmur LSB Abdomen -NT/ND, no HSM, no mass, + bowel sounds, no bruit 2+ femoral pulses, no bruits Ext-no edema, chords, 2+ DP Neuro-grossly nonfocal  ECG Ventricular pacing.  Results for orders placed during the hospital encounter of 07/27/13 (from the past 48 hour(s))  CBC WITH DIFFERENTIAL     Status: None   Collection Time    07/27/13  5:03 PM      Result Value Ref Range   WBC 9.0  4.0 - 10.5 K/uL   RBC 4.82  3.87 - 5.11 MIL/uL   Hemoglobin 14.6  12.0 - 15.0 g/dL   HCT 42.5  36.0 - 46.0 %   MCV 88.2  78.0 - 100.0 fL   MCH 30.3  26.0 - 34.0 pg   MCHC 34.4  30.0 - 36.0 g/dL   RDW 13.2  11.5 - 15.5 %   Platelets 234  150 - 400 K/uL   Neutrophils Relative % 68  43 - 77 %   Neutro Abs 6.0  1.7 - 7.7 K/uL   Lymphocytes Relative 23  12 - 46 %   Lymphs Abs 2.1  0.7 - 4.0 K/uL   Monocytes Relative 7  3 - 12 %   Monocytes Absolute 0.6  0.1 - 1.0 K/uL   Eosinophils Relative 2  0 - 5 %   Eosinophils Absolute 0.2  0.0 - 0.7 K/uL   Basophils Relative 0  0 - 1 %   Basophils Absolute 0.0  0.0 - 0.1 K/uL  COMPREHENSIVE METABOLIC PANEL     Status: Abnormal   Collection Time    07/27/13  5:03 PM      Result Value Ref Range   Sodium 144  137 - 147 mEq/L   Potassium 3.5 (*) 3.7 - 5.3 mEq/L   Chloride 104  96 - 112 mEq/L   CO2 25  19 - 32 mEq/L   Glucose, Bld 204 (*) 70 - 99 mg/dL   BUN 19  6 - 23 mg/dL   Creatinine, Ser 1.13 (*) 0.50 - 1.10 mg/dL   Calcium 9.5  8.4 - 10.5 mg/dL   Total Protein  6.8  6.0 - 8.3 g/dL   Albumin 3.8  3.5 - 5.2 g/dL   AST 22  0 - 37 U/L   ALT 21  0 - 35 U/L   Alkaline Phosphatase 58  39 - 117 U/L   Total Bilirubin 0.4  0.3 - 1.2 mg/dL   GFR calc non Af Amer 48 (*) >90 mL/min   GFR calc Af Amer 56 (*) >90 mL/min   Comment: (NOTE)     The eGFR has been calculated using the CKD EPI equation.     This calculation has not been validated in all clinical situations.     eGFR's persistently <90 mL/min signify possible Chronic Kidney     Disease.   Anion gap 15  5 - 15  I-STAT TROPOININ, ED     Status: None   Collection Time    07/27/13  5:16 PM      Result Value Ref Range   Troponin i, poc 0.01  0.00 - 0.08 ng/mL   Comment 3  Comment: Due to the release kinetics of cTnI,     a negative result within the first hours     of the onset of symptoms does not rule out     myocardial infarction with certainty.     If myocardial infarction is still suspected,     repeat the test at appropriate intervals.  I-STAT CHEM 8, ED     Status: Abnormal   Collection Time    07/27/13  5:18 PM      Result Value Ref Range   Sodium 142  137 - 147 mEq/L   Potassium 3.1 (*) 3.7 - 5.3 mEq/L   Chloride 104  96 - 112 mEq/L   BUN 19  6 - 23 mg/dL   Creatinine, Ser 1.30 (*) 0.50 - 1.10 mg/dL   Glucose, Bld 215 (*) 70 - 99 mg/dL   Calcium, Ion 1.18  1.13 - 1.30 mmol/L   TCO2 24  0 - 100 mmol/L   Hemoglobin 15.3 (*) 12.0 - 15.0 g/dL   HCT 45.0  36.0 - 46.0 %  I-STAT TROPOININ, ED     Status: None   Collection Time    07/27/13  8:19 PM      Result Value Ref Range   Troponin i, poc 0.02  0.00 - 0.08 ng/mL   Comment 3            Comment: Due to the release kinetics of cTnI,     a negative result within the first hours     of the onset of symptoms does not rule out     myocardial infarction with certainty.     If myocardial infarction is still suspected,     repeat the test at appropriate intervals.  CBC     Status: None   Collection Time    07/28/13  4:42 AM       Result Value Ref Range   WBC 8.9  4.0 - 10.5 K/uL   RBC 4.52  3.87 - 5.11 MIL/uL   Hemoglobin 13.4  12.0 - 15.0 g/dL   HCT 40.8  36.0 - 46.0 %   MCV 90.3  78.0 - 100.0 fL   MCH 29.6  26.0 - 34.0 pg   MCHC 32.8  30.0 - 36.0 g/dL   RDW 13.5  11.5 - 15.5 %   Platelets 217  150 - 400 K/uL  DIFFERENTIAL     Status: None   Collection Time    07/28/13  4:42 AM      Result Value Ref Range   Neutrophils Relative % 63  43 - 77 %   Neutro Abs 5.5  1.7 - 7.7 K/uL   Lymphocytes Relative 26  12 - 46 %   Lymphs Abs 2.3  0.7 - 4.0 K/uL   Monocytes Relative 9  3 - 12 %   Monocytes Absolute 0.8  0.1 - 1.0 K/uL   Eosinophils Relative 2  0 - 5 %   Eosinophils Absolute 0.2  0.0 - 0.7 K/uL   Basophils Relative 0  0 - 1 %   Basophils Absolute 0.0  0.0 - 0.1 K/uL  TROPONIN I     Status: None   Collection Time    07/28/13  5:00 AM      Result Value Ref Range   Troponin I <0.30  <0.30 ng/mL   Comment:            Due to the release kinetics of cTnI,  a negative result within the first hours     of the onset of symptoms does not rule out     myocardial infarction with certainty.     If myocardial infarction is still suspected,     repeat the test at appropriate intervals.  COMPREHENSIVE METABOLIC PANEL     Status: Abnormal   Collection Time    07/28/13  5:00 AM      Result Value Ref Range   Sodium 148 (*) 137 - 147 mEq/L   Potassium 4.7  3.7 - 5.3 mEq/L   Comment: DELTA CHECK NOTED   Chloride 108  96 - 112 mEq/L   CO2 30  19 - 32 mEq/L   Glucose, Bld 96  70 - 99 mg/dL   BUN 16  6 - 23 mg/dL   Creatinine, Ser 0.98  0.50 - 1.10 mg/dL   Calcium 8.6  8.4 - 10.5 mg/dL   Total Protein 5.9 (*) 6.0 - 8.3 g/dL   Albumin 3.3 (*) 3.5 - 5.2 g/dL   AST 16  0 - 37 U/L   ALT 17  0 - 35 U/L   Alkaline Phosphatase 48  39 - 117 U/L   Total Bilirubin 0.4  0.3 - 1.2 mg/dL   GFR calc non Af Amer 57 (*) >90 mL/min   GFR calc Af Amer 66 (*) >90 mL/min   Comment: (NOTE)     The eGFR has been  calculated using the CKD EPI equation.     This calculation has not been validated in all clinical situations.     eGFR's persistently <90 mL/min signify possible Chronic Kidney     Disease.   Anion gap 10  5 - 15  CBC WITH DIFFERENTIAL     Status: None   Collection Time    07/28/13  5:00 AM      Result Value Ref Range   WBC PENDING     RBC PENDING     Hemoglobin PENDING     HCT PENDING     MCV PENDING     MCH PENDING     MCHC PENDING     RDW PENDING     Platelets PENDING     Neutrophils Relative % DUPLICATE REQUEST     Neutro Abs DUPLICATE REQUEST     Band Neutrophils DUPLICATE REQUEST     Lymphocytes Relative DUPLICATE REQUEST     Lymphs Abs DUPLICATE REQUEST     Monocytes Relative DUPLICATE REQUEST     Monocytes Absolute DUPLICATE REQUEST     Eosinophils Relative DUPLICATE REQUEST     Eosinophils Absolute DUPLICATE REQUEST     Basophils Relative DUPLICATE REQUEST     Basophils Absolute DUPLICATE REQUEST     LUCs, % DUPLICATE REQUEST     LUC, Absolute DUPLICATE REQUEST     WBC Morphology DUPLICATE REQUEST     RBC Morphology DUPLICATE REQUEST     Smear Review DUPLICATE REQUEST     Other DUPLICATE REQUEST     Other 2 DUPLICATE REQUEST     nRBC DUPLICATE REQUEST     Metamyelocytes Relative DUPLICATE REQUEST     Myelocytes DUPLICATE REQUEST     Promyelocytes Absolute DUPLICATE REQUEST     Blasts DUPLICATE REQUEST    PROCALCITONIN     Status: None   Collection Time    07/28/13  5:00 AM      Result Value Ref Range   Procalcitonin <0.10     Comment:  Interpretation:     PCT (Procalcitonin) <= 0.5 ng/mL:     Systemic infection (sepsis) is not likely.     Local bacterial infection is possible.     RESULT REPEATED AND VERIFIED     (NOTE)             ICU PCT Algorithm               Non ICU PCT Algorithm        ----------------------------     ------------------------------             PCT < 0.25 ng/mL                 PCT < 0.1 ng/mL         Stopping of  antibiotics            Stopping of antibiotics           strongly encouraged.               strongly encouraged.        ----------------------------     ------------------------------           PCT level decrease by               PCT < 0.25 ng/mL           >= 80% from peak PCT           OR PCT 0.25 - 0.5 ng/mL          Stopping of antibiotics                                                 encouraged.         Stopping of antibiotics               encouraged.        ----------------------------     ------------------------------           PCT level decrease by              PCT >= 0.25 ng/mL           < 80% from peak PCT            AND PCT >= 0.5 ng/mL            Continuing antibiotics                                                  encouraged.           Continuing antibiotics                encouraged.        ----------------------------     ------------------------------         PCT level increase compared          PCT > 0.5 ng/mL             with peak PCT AND              PCT >= 0.5 ng/mL             Escalation of antibiotics  strongly encouraged.          Escalation of antibiotics            strongly encouraged.  TROPONIN I     Status: None   Collection Time    07/28/13  8:40 AM      Result Value Ref Range   Troponin I <0.30  <0.30 ng/mL   Comment:            Due to the release kinetics of cTnI,     a negative result within the first hours     of the onset of symptoms does not rule out     myocardial infarction with certainty.     If myocardial infarction is still suspected,     repeat the test at appropriate intervals.  HEPARIN LEVEL (UNFRACTIONATED)     Status: None   Collection Time    07/28/13 10:10 AM      Result Value Ref Range   Heparin Unfractionated 0.37  0.30 - 0.70 IU/mL   Comment:            IF HEPARIN RESULTS ARE BELOW     EXPECTED VALUES, AND PATIENT     DOSAGE HAS BEEN CONFIRMED,     SUGGEST FOLLOW UP TESTING      OF ANTITHROMBIN III LEVELS.    Ct Angio Chest Pe W/cm &/or Wo Cm  07/27/2013   CLINICAL DATA:  Sudden onset of chest pain with shortness of breath associated with nausea and diaphoresis  EXAM: CT ANGIOGRAPHY CHEST WITH CONTRAST  TECHNIQUE: Multidetector CT imaging of the chest was performed using the standard protocol during bolus administration of intravenous contrast. Multiplanar CT image reconstructions and MIPs were obtained to evaluate the vascular anatomy.  CONTRAST:  161mL OMNIPAQUE IOHEXOL 350 MG/ML SOLN intravenously  COMPARISON:  Portable chest x-ray of July 27, 2013  FINDINGS: Contrast within the pulmonary arterial tree is normal. There are no filling defects to suggest an acute pulmonary embolism. The caliber of the thoracic aorta is normal. No false lumen is demonstrated. The cardiac chambers are top-normal in size. There are dense coronary artery calcifications. There is no pleural nor pericardial effusion. There is no mediastinal nor hilar lymphadenopathy. There is a small hiatal hernia.  There is a small amount of apical pleural scarring bilaterally. There is there is increased density posteriorly in the right lower lobe. There are no pulmonary parenchymal nodules nor alveolar infiltrates.  Within the upper abdomen the observed portions of the liver and spleen are normal. A tiny amount of intrahepatic gas is consistent with previous cholecystectomy. The stomach is moderately distended with food. The thoracic spine exhibits mild degenerative disc change at multiple levels. The observed portions of the rib cage are normal. There is no retrosternal abnormality.  Review of the MIP images confirms the above findings.  IMPRESSION: 1. There is no acute pulmonary embolism nor acute thoracic aortic pathology. 2. There is no evidence of CHF. There is coronary artery calcification. 3. There is right lower lobe atelectasis or early pneumonia. 4. There is no lymphadenopathy nor pleural effusion.    Electronically Signed   By: David  Martinique   On: 07/27/2013 20:02   Dg Chest Port 1 View  07/27/2013   CLINICAL DATA:  Chest pain, shortness of breath dizziness.  EXAM: PORTABLE CHEST - 1 VIEW  COMPARISON:  Single view of the chest 02/27/2013 and PA and lateral chest 11/12/2011.  FINDINGS: The lungs are clear. Heart size is normal. No  pneumothorax or pleural effusion is identified. Dual lead pacing device is again seen and appears unchanged.  IMPRESSION: No acute abnormality.   Electronically Signed   By: Inge Rise M.D.   On: 07/27/2013 17:23    Assessment/Plan 1 unstable angina-patient presents with chest pain with exertion relieved with rest. Symptoms sound concerning but also with atypical features. She had a nuclear study in February of this year it showed no ischemia. She has ruled out for myocardial infarction. Feel definitive evaluation is warranted. See with cardiac catheterization. The risks and benefits were discussed and she agrees to proceed. Note the patient had a CTA on admission to rule out pulmonary embolus which was negative. Would not perform ventriculogram because of dye load. 2 coronary artery disease-continue aspirin and statin. 3 history of pacemaker-device has been interrogated and is functioning appropriately. 4 hyperlipidemia-continue statin. 5 hypertension-continue preadmission medications.  Kirk Ruths MD 07/28/2013, 11:24 AM

## 2013-07-28 NOTE — Progress Notes (Addendum)
Removed TR band per MD order per hospital policy. Patient tolerated well. After 30 minutes when all 12 cc of air was released from TR band, patient's left radial was a level 1. Slight bruising. Applied gauze and tape to site. Will continue to monitor closely. Lajuana Matteina Dakarai Mcglocklin, RN

## 2013-07-28 NOTE — Progress Notes (Signed)
Utilization review completed. Dayton Sherr, RN, BSN. 

## 2013-07-28 NOTE — Progress Notes (Signed)
Pt requested chaplain to come and explain AD. Chaplain visited pt and husband and went over in detailed the AD. Pt and husband decided that they both had already agreed to the terms and conditions that their medical care will take. They decided that they did not want to complete an AD. However, the did request prayer. Chaplain provided a listening ear and prayer.   Cindie CrumblyBeverley Ulla Mckiernan, [address]haplain

## 2013-07-29 LAB — COMPREHENSIVE METABOLIC PANEL
ALT: 17 U/L (ref 0–35)
AST: 16 U/L (ref 0–37)
Albumin: 3.1 g/dL — ABNORMAL LOW (ref 3.5–5.2)
Alkaline Phosphatase: 49 U/L (ref 39–117)
Anion gap: 11 (ref 5–15)
BUN: 13 mg/dL (ref 6–23)
CALCIUM: 8.7 mg/dL (ref 8.4–10.5)
CO2: 28 mEq/L (ref 19–32)
CREATININE: 0.98 mg/dL (ref 0.50–1.10)
Chloride: 104 mEq/L (ref 96–112)
GFR, EST AFRICAN AMERICAN: 66 mL/min — AB (ref >90)
GFR, EST NON AFRICAN AMERICAN: 57 mL/min — AB (ref >90)
GLUCOSE: 91 mg/dL (ref 70–99)
Potassium: 3.7 mEq/L (ref 3.7–5.3)
Sodium: 143 mEq/L (ref 137–147)
Total Bilirubin: 0.4 mg/dL (ref 0.3–1.2)
Total Protein: 5.6 g/dL — ABNORMAL LOW (ref 6.0–8.3)

## 2013-07-29 LAB — CBC WITH DIFFERENTIAL/PLATELET
Basophils Absolute: 0 10*3/uL (ref 0.0–0.1)
Basophils Relative: 0 % (ref 0–1)
EOS PCT: 4 % (ref 0–5)
Eosinophils Absolute: 0.2 10*3/uL (ref 0.0–0.7)
HEMATOCRIT: 38.9 % (ref 36.0–46.0)
HEMOGLOBIN: 13.1 g/dL (ref 12.0–15.0)
LYMPHS ABS: 1.6 10*3/uL (ref 0.7–4.0)
Lymphocytes Relative: 25 % (ref 12–46)
MCH: 30.3 pg (ref 26.0–34.0)
MCHC: 33.7 g/dL (ref 30.0–36.0)
MCV: 90 fL (ref 78.0–100.0)
MONO ABS: 0.6 10*3/uL (ref 0.1–1.0)
MONOS PCT: 10 % (ref 3–12)
NEUTROS ABS: 3.9 10*3/uL (ref 1.7–7.7)
Neutrophils Relative %: 61 % (ref 43–77)
Platelets: 211 10*3/uL (ref 150–400)
RBC: 4.32 MIL/uL (ref 3.87–5.11)
RDW: 13.5 % (ref 11.5–15.5)
WBC: 6.3 10*3/uL (ref 4.0–10.5)

## 2013-07-29 LAB — TSH: TSH: 1.14 u[IU]/mL (ref 0.350–4.500)

## 2013-07-29 NOTE — Discharge Summary (Signed)
Physician Discharge Summary  Kelsey Dunn JXB:[id number] DOB: [birth year] DOA: 07/27/2013  PCP: Crawford Givens, MD  Admit date: 07/27/2013 Discharge date: 07/29/2013  Time spent: 35 minutes  Recommendations for Outpatient Follow-up:  1.  recommend screen for bipolar using a clinical tool such as a GAD 7 or pH q. 9  2. Please followup TSH that was performed in the hospital  3. She's been given breathing exercises to help with anxiety as from families report she does seem to get anxious and worry about multiple family stressors  4. Cardiology should see her in the near future    Discharge Diagnoses:  Principal Problem:   Chest pain Active Problems:   HYPERTENSION, MALIGNANT ESSENTIAL   ESSENTIAL HYPERTENSION   CAD, NATIVE VESSEL   Unstable angina   Discharge Condition:  good   Diet recommendation:  heart healthy   Filed Weights   07/28/13 0143  Weight: 94.1 kg (207 lb 7.3 oz)    History of present illness:  73 y/o ?, known h/o CAD-DES 2011-admitted for UA 08/11/11 with neg cardiac cath-myoview 02/2013 no ischemia, AV block s/p ppm 112013, ? 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  States CP ~ 6 months ago. States had shortness of breath started insidiously and was not able to do her 1 mile on her treadmill. Over the past couple of months this is decreased to half a mile. She started to have increasing dyspnea with levine's sign. On 7/22 she felt dyspneic had a heaviness in her chest and was diaphoretic while in her carport. After using treadmill 7/23, she became short of breath dyspneic and felt uncomfortable. She describes the discomfort as a 10 on 10 no acute pain but heaviness. She was admitted and placed on nitroglycerin as well as heparin IV and cardiology was consulted  Ultimately she underwent a cardiac catheterization with report as below I  it was thought that she had noncardiogenic chest  pain as is explained to her by myself and her cardiologist who did her catheterization.Marland Kitchen She will followup with that physician in the near future as well as do some breathing exercises for anxiety She did obtain a TSH level that will rule out hyperthyroid state and she is on Synthroid 100 mcg as well however this may be less likely. She was advised to slow down and do things in a reasonable manageable fashion and may need screening for bipolar as an outpatient    Procedures:  cardiac catheterization = mpression:  1. Single vessel CAD with patent stent RCA  2. Normal LV function  3. Non-cardiac chest pain (i.e. Studies not automatically included, echos, thoracentesis, etc; not x-rays)  Consultations:  Cardiology  Discharge Exam: Filed Vitals:   07/29/13 0632  BP: 120/61  Pulse: 71  Temp: 97.9 F (36.6 C)  Resp: 18    General:  alert pleasant oriented in no apparent distress tolerating ambulation  Cardiovascular: S1-S2 no murmur rub or gallop  Respiratory:  clinically clear no added sound   Discharge Instructions You were cared for by a hospitalist during your hospital stay. If you have any questions about your discharge medications or the care you received while you were in the hospital after you are discharged, you can call the unit and asked to speak with the hospitalist on call if the hospitalist that took care of you is not available. Once you are discharged, your primary care physician will handle any further medical  issues. Please note that NO REFILLS for any discharge medications will be authorized once you are discharged, as it is imperative that you return to your primary care physician (or establish a relationship with a primary care physician if you do not have one) for your aftercare needs so that they can reassess your need for medications and monitor your lab values.  Discharge Instructions   Diet - low sodium heart healthy    Complete by:  As directed      Discharge  instructions    Complete by:  As directed   Would recommend following up with your TSH I primary care provider to make sure that this has not caused her anxiety and her hot flashes. I would recommend also close followup with your regular physician heart physician Dr. Sherlynn StallsMacalhaney to make sure that you are further needs are addressed in the outpatient setting. Please do the breathing exercises that I've given     Increase activity slowly    Complete by:  As directed             Medication List         amLODipine 5 MG tablet  Commonly known as:  NORVASC  Take 5 mg by mouth daily.     aspirin EC 81 MG tablet  Take 81 mg by mouth every morning.     AZO BLADDER CONTROL/GO-LESS Caps  Take 2 capsules by mouth daily as needed (for spasms).     clopidogrel 75 MG tablet  Commonly known as:  PLAVIX  Take 75 mg by mouth daily with breakfast.     Coenzyme Q10 200 MG capsule  Take 200 mg by mouth 2 (two) times daily.     FISH OIL BURP-LESS 500 MG Caps  Take 1,000 mg by mouth 2 (two) times daily.     furosemide 40 MG tablet  Commonly known as:  LASIX  Take 40 mg by mouth daily as needed for edema.     lansoprazole 15 MG capsule  Commonly known as:  PREVACID  Take 15 mg by mouth as needed (reflux).     levothyroxine 100 MCG tablet  Commonly known as:  SYNTHROID, LEVOTHROID  Take 100 mcg by mouth daily before breakfast.     losartan-hydrochlorothiazide 100-25 MG per tablet  Commonly known as:  HYZAAR  Take 1 tablet by mouth every morning.     nitroGLYCERIN 0.4 MG SL tablet  Commonly known as:  NITROSTAT  Place 0.4 mg under the tongue every 5 (five) minutes as needed. For chest pain     simvastatin 10 MG tablet  Commonly known as:  ZOCOR  Take 10 mg by mouth daily at 6 PM.       Allergies  Allergen Reactions  . Anesthetics, Amide Other (See Comments)    REACTION:FEVER, SYNCOPE  . Codeine Nausea And Vomiting    Reaction:Hallucinations  . Nitrofurantoin Nausea Only and  Other (See Comments)    REACTION: Hallucinations  . Orphenadrine Citrate Nausea And Vomiting and Other (See Comments)    REACTION: Hallucination  . Pentosan Polysulfate Sodium Rash and Other (See Comments)    REACTION: rash/ severe headache       Follow-up Information   Follow up with Crawford GivensGraham [name], MD In 1 week.   Specialty:  Family Medicine   Contact information:   [address] [place] [address] [phone]       Follow up with Verne CarrowMCALHANY,CHRISTOPHER, MD In 2 weeks.   Specialty:  Cardiology   Contact information:   [address] [place] [address] [phone]        The results of significant diagnostics from this hospitalization (including imaging, microbiology, ancillary and laboratory) are listed below for reference.    Significant Diagnostic Studies: Ct Angio Chest Pe W/cm &/or Wo Cm  07/27/2013   CLINICAL DATA:  Sudden onset of chest pain with shortness of breath associated with nausea and diaphoresis  EXAM: CT ANGIOGRAPHY CHEST WITH CONTRAST  TECHNIQUE: Multidetector CT imaging of the chest was performed using the standard protocol during bolus administration of intravenous contrast. Multiplanar CT image reconstructions and MIPs were obtained to evaluate the vascular anatomy.  CONTRAST:  OMNIPAQUE IOHEXOL 350 MG/ML SOLN intravenously  COMPARISON:  Portable chest x-ray of July 27, 2013  FINDINGS: Contrast within the pulmonary arterial tree is normal. There are no filling defects to suggest an acute pulmonary embolism. The caliber of the thoracic aorta is normal. No false lumen is demonstrated. The cardiac chambers are top-normal in size. There are dense coronary artery calcifications. There is no pleural nor pericardial effusion. There is no mediastinal nor hilar lymphadenopathy. There is a small hiatal hernia.  There is a small amount of apical pleural scarring bilaterally. There is there is increased density posteriorly in the right lower  lobe. There are no pulmonary parenchymal nodules nor alveolar infiltrates.  Within the upper abdomen the observed portions of the liver and spleen are normal. A tiny amount of intrahepatic gas is consistent with previous cholecystectomy. The stomach is moderately distended with food. The thoracic spine exhibits mild degenerative disc change at multiple levels. The observed portions of the rib cage are normal. There is no retrosternal abnormality.  Review of the MIP images confirms the above findings.  IMPRESSION: 1. There is no acute pulmonary embolism nor acute thoracic aortic pathology. 2. There is no evidence of CHF. There is coronary artery calcification. 3. There is right lower lobe atelectasis or early pneumonia. 4. There is no lymphadenopathy nor pleural effusion.   Electronically Signed   By: David  [place]   On: 07/27/2013 20:02   Dg Chest Port 1 View  07/27/2013   CLINICAL DATA:  Chest pain, shortness of breath dizziness.  EXAM: PORTABLE CHEST - 1 VIEW  COMPARISON:  Single view of the chest 02/27/2013 and PA and lateral chest 11/12/2011.  FINDINGS: The lungs are clear. Heart size is normal. No pneumothorax or pleural effusion is identified. Dual lead pacing device is again seen and appears unchanged.  IMPRESSION: No acute abnormality.   Electronically Signed   By: Drusilla Kanner M.D.   On: 07/27/2013 17:23    Microbiology: No results found for this or any previous visit (from the past 240 hour(s)).   Labs: Basic Metabolic Panel:  Recent Labs Lab 07/27/13 1703 07/27/13 1718 07/28/13 0500 07/29/13 0402  NA 144 142 148* 143  K 3.5* 3.1* 4.7 3.7  CL 104 104 108 104  CO2 25  --  30 28  GLUCOSE 204* 215* 96 91  BUN 19 19 16 13   CREATININE 1.13* 1.30* 0.98 0.98  CALCIUM 9.5  --  8.6 8.7   Liver Function Tests:  Recent Labs Lab 07/27/13 1703 07/28/13 0500 07/29/13 0402  AST 22 16 16   ALT 21 17 17   ALKPHOS 58 48 49  BILITOT 0.4 0.4 0.4  PROT 6.8 5.9* 5.6*  ALBUMIN 3.8 3.3*  3.1*   No results found for this basename: LIPASE, AMYLASE,  in the last 168 hours No results found for this basename: AMMONIA,  in the last 168 hours CBC:  Recent Labs Lab 07/27/13 1703 07/27/13 1718 07/28/13 0442 07/28/13 0500 07/29/13 0402  WBC 9.0  --  8.9 PENDING 6.3  NEUTROABS 6.0  --  5.5 DUPLICATE REQUEST 3.9  HGB 14.6 15.3* 13.4 PENDING 13.1  HCT 42.5 45.0 40.8 PENDING 38.9  MCV 88.2  --  90.3 PENDING 90.0  PLT 234  --  217 PENDING 211   Cardiac Enzymes:  Recent Labs Lab 07/28/13 0500 07/28/13 0840 07/28/13 1412  TROPONINI <0.30 <0.30 <0.30   BNP: BNP (last 3 results)  Recent Labs  02/27/13 2341  PROBNP 95.0   CBG: No results found for this basename: GLUCAP,  in the last 168 hours     Signed:  Rhetta Mura  [facility] 07/29/2013, 10:59 AM

## 2013-07-29 NOTE — Progress Notes (Signed)
Reviewed patient DC summary. Answered all questions and helped patient to car. Home with husband.

## 2013-08-03 ENCOUNTER — Telehealth: Payer: Self-pay | Admitting: Family Medicine

## 2013-08-03 NOTE — Telephone Encounter (Signed)
Patient Information:  Caller Name: Junious DresserConnie  Phone: [phone]  Patient: Kelsey HopeWoody, Sapphira Dunn  Gender: Female  DOB: [birth year]  Age: 73 Years  PCP: Crawford Givensuncan, Graham Clelia Croft([facility]) [facility] Practice)  Office Follow Up:  Does the office need to follow up with this patient?: Yes  Instructions For The Office: Note to office for follow up related to possible appointment vs ED for evaluation.   Symptoms  Reason For Call & Symptoms: Patient calling. She reports she was hospitalized 07/27/13 ; came home on 7/25.  She continues to have chest pain in center of chest.  BP 155/88 :Pulse 98; 122/81-103 on recheck. She has follow up appointment on 08/07/13.  She asks to see PCP on 08/04/13.  Temp currently 99.5.  Chest  pain rated at 9 of 10; constant since 10:00 and she relates, "It doesn't get like this until I walk."  She relates she received conflicting information related to her CT scan of chest.  She had cardiac cath  07/28/13.  Emergent symptoms ruled out.  Go to ED Now or to Office with PCP Approval per Chest pain Guideline due to Intermittent Chest pain and pain has been increasing in severity or frequency.  Reviewed Health History In EMR: Yes  Reviewed Medications In EMR: Yes  Reviewed Allergies In EMR: Yes  Reviewed Surgeries / Procedures: Yes  Date of Onset of Symptoms: Unknown  Treatments Tried: She was hospitalized, checked for heart problems  Treatments Tried Worked: No  Any Fever: Yes  Fever Taken: Ear Thermometer  Fever Time Of Reading: 15:05:23  Fever Last Reading: 99.5  Guideline(s) Used:  Chest Pain  Disposition Per Guideline:   Go to ED Now (or to Office with PCP Approval)  Reason For Disposition Reached:   Intermittent chest pain and pain has been increasing in severity or frequency  Advice Given:  Call Back If:  Severe chest pain  Constant chest pain lasting longer than 5 minutes  Difficulty breathing  You become worse.  RN Overrode Recommendation:  Document Patient  Note to  office for follow up related to possible appointment vs ED for evaluation.

## 2013-08-03 NOTE — Telephone Encounter (Signed)
Patient advised.   Appointment scheduled for Friday, August 04, 2013.

## 2013-08-03 NOTE — Telephone Encounter (Signed)
This is unlikely to be cardiac given the recent hx, but if truly 9/10 pain, then to ER.  O/w schedule here tomorrow.

## 2013-08-04 ENCOUNTER — Encounter: Payer: Self-pay | Admitting: Family Medicine

## 2013-08-04 ENCOUNTER — Ambulatory Visit (INDEPENDENT_AMBULATORY_CARE_PROVIDER_SITE_OTHER): Payer: Commercial Managed Care - HMO | Admitting: Family Medicine

## 2013-08-04 VITALS — BP 152/78 | HR 69 | Temp 97.7°F | Wt 201.5 lb

## 2013-08-04 DIAGNOSIS — R0602 Shortness of breath: Secondary | ICD-10-CM

## 2013-08-04 NOTE — Patient Instructions (Signed)
Stay off the treadmill for now and let me talk to pulmonary and cardiology.  We'll be in touch.  Take care.  Cancel the appointment for Monday.

## 2013-08-04 NOTE — Progress Notes (Signed)
Pre visit review using our clinic review tool, if applicable. No additional management support is needed unless otherwise documented below in the visit note.  Recently admitted with CP, cath neg.  Notes reviewed.    She had been walking ~1 mile a day prev.  About 6 months ago she started getting central anterior chest pain.  She noted more dyspnea on the treadmill, but not daily.  She had cards outpatient eval at that point.  She wasn't getting worse was still able to exercise.  In the last 3 weeks, she wasn't able to walk as long as prev, got down to 6 minutes before she got SOB.  She has had sweats and SOB during exertion.  It still doesn't happen everyday.  Today happens to be a good day.    She doesn't think she is anxious.  D/w pt.  She scored 5/21 on a GAD 7, and that is with likely high scoring.  Her true score is likely lower.    She still does her washing, cooking, reading, etc.  She doesn't sound depressed. No SI/HI.    She has lost weight intentionally.   PMH and SH reviewed  ROS: See HPI, otherwise noncontributory.  Meds, vitals, and allergies reviewed.   GEN: nad, alert and oriented HEENT: mucous membranes moist NECK: supple w/o LA CV: rrr PULM: ctab, no inc wob ABD: soft, +bs EXT: no edema SKIN: no acute rash

## 2013-08-06 DIAGNOSIS — R0602 Shortness of breath: Secondary | ICD-10-CM | POA: Insufficient documentation

## 2013-08-06 NOTE — Assessment & Plan Note (Signed)
She doesn't appear SOB or in distress today.  She doesn't seem to be anxious.  We talked about this.  Recent cath w/o need for intervention.  Her spirometry today was unremarkable. I didn't want to use a SABA at this point given her other conditions. I want to talk to cards and pulmonary about this- to see if she should have a treadmill test with spirometry after exercise.  She appears okay for outpatient f/u.  She clearly has a dec in exercise tolerance and I don't see evidence of a mood disorder.  D/w pt.  She agrees.  App help of all involved.  >25 minutes spent in face to face time with patient, >50% spent in counselling or coordination of care.

## 2013-08-07 ENCOUNTER — Ambulatory Visit: Payer: Medicare HMO | Admitting: Family Medicine

## 2013-08-08 ENCOUNTER — Telehealth: Payer: Self-pay | Admitting: Internal Medicine

## 2013-08-08 DIAGNOSIS — R0600 Dyspnea, unspecified: Secondary | ICD-10-CM

## 2013-08-08 NOTE — Telephone Encounter (Signed)
Kelsey Dunn  Please give her new consult appt < 4 weeks after completing full PFT for dyspnea. rEferred by Dr Clifton JamesMcAlhany of cards and pcp Crawford GivensGraham [name], MD   Thanks  Dr. Kalman ShanMurali Osie Merkin, M.D., [facility].C.C.P Pulmonary and Critical Care Medicine Staff Physician [facility] Pager: [phone], If no answer or between  15:00h - 7:00h: call [phone]  08/08/2013 4:09 PM

## 2013-08-09 NOTE — Telephone Encounter (Signed)
Called and spoke to pt. Appt made for 8/28 and PFT scheduled for 8/27. Pt aware of time and date. Order placed for PFT. Nothing further needed.

## 2013-08-10 ENCOUNTER — Ambulatory Visit (INDEPENDENT_AMBULATORY_CARE_PROVIDER_SITE_OTHER): Payer: Commercial Managed Care - HMO | Admitting: *Deleted

## 2013-08-10 DIAGNOSIS — I441 Atrioventricular block, second degree: Secondary | ICD-10-CM

## 2013-08-10 NOTE — Progress Notes (Signed)
Remote pacemaker transmission.   

## 2013-08-11 LAB — MDC_IDC_ENUM_SESS_TYPE_REMOTE
Battery Impedance: 111 Ohm
Brady Statistic AP VP Percent: 0 %
Brady Statistic AP VS Percent: 27 %
Brady Statistic AS VP Percent: 2 %
Brady Statistic AS VS Percent: 70 %
Date Time Interrogation Session: 20150806122519
Lead Channel Impedance Value: 429 Ohm
Lead Channel Impedance Value: 568 Ohm
Lead Channel Pacing Threshold Amplitude: 0.5 V
Lead Channel Pacing Threshold Pulse Width: 0.4 ms
Lead Channel Pacing Threshold Pulse Width: 0.4 ms
Lead Channel Sensing Intrinsic Amplitude: 5.6 mV
Lead Channel Setting Pacing Amplitude: 2.75 V
Lead Channel Setting Sensing Sensitivity: 2 mV
MDC IDC MSMT BATTERY REMAINING LONGEVITY: 163 mo
MDC IDC MSMT BATTERY VOLTAGE: 2.79 V
MDC IDC MSMT LEADCHNL RA SENSING INTR AMPL: 2.8 mV
MDC IDC MSMT LEADCHNL RV PACING THRESHOLD AMPLITUDE: 1.375 V
MDC IDC SET LEADCHNL RA PACING AMPLITUDE: 2 V
MDC IDC SET LEADCHNL RV PACING PULSEWIDTH: 0.4 ms

## 2013-08-17 ENCOUNTER — Encounter: Payer: Self-pay | Admitting: Cardiology

## 2013-08-23 ENCOUNTER — Other Ambulatory Visit: Payer: Self-pay | Admitting: Family Medicine

## 2013-08-24 ENCOUNTER — Encounter: Payer: Self-pay | Admitting: Internal Medicine

## 2013-08-31 ENCOUNTER — Ambulatory Visit (INDEPENDENT_AMBULATORY_CARE_PROVIDER_SITE_OTHER): Payer: Commercial Managed Care - HMO | Admitting: Internal Medicine

## 2013-08-31 DIAGNOSIS — R0989 Other specified symptoms and signs involving the circulatory and respiratory systems: Secondary | ICD-10-CM

## 2013-08-31 DIAGNOSIS — R0609 Other forms of dyspnea: Secondary | ICD-10-CM | POA: Diagnosis not present

## 2013-08-31 DIAGNOSIS — R0600 Dyspnea, unspecified: Secondary | ICD-10-CM

## 2013-08-31 LAB — PULMONARY FUNCTION TEST
DL/VA % PRED: 147 %
DL/VA: 6.03 ml/min/mmHg/L
DLCO unc % pred: 128 %
DLCO unc: 22.54 ml/min/mmHg
FEF 25-75 Post: 3.37 L/sec
FEF 25-75 Pre: 2.64 L/sec
FEF2575-%CHANGE-POST: 27 %
FEF2575-%PRED-POST: 213 %
FEF2575-%PRED-PRE: 166 %
FEV1-%Change-Post: 7 %
FEV1-%PRED-PRE: 105 %
FEV1-%Pred-Post: 113 %
FEV1-POST: 2.03 L
FEV1-PRE: 1.89 L
FEV1FVC-%CHANGE-POST: 2 %
FEV1FVC-%PRED-PRE: 111 %
FEV6-%CHANGE-POST: 5 %
FEV6-%Pred-Post: 103 %
FEV6-%Pred-Pre: 97 %
FEV6-Post: 2.34 L
FEV6-Pre: 2.21 L
FEV6FVC-%Change-Post: 0 %
FEV6FVC-%Pred-Post: 105 %
FEV6FVC-%Pred-Pre: 105 %
FVC-%Change-Post: 4 %
FVC-%Pred-Post: 98 %
FVC-%Pred-Pre: 93 %
FVC-Post: 2.34 L
FVC-Pre: 2.23 L
POST FEV1/FVC RATIO: 87 %
POST FEV6/FVC RATIO: 100 %
Pre FEV1/FVC ratio: 84 %
Pre FEV6/FVC Ratio: 100 %
RV % pred: 86 %
RV: 1.68 L
TLC % PRED: 95 %
TLC: 4.09 L

## 2013-08-31 NOTE — Progress Notes (Signed)
PFT done today. 

## 2013-09-01 ENCOUNTER — Encounter: Payer: Self-pay | Admitting: Internal Medicine

## 2013-09-01 ENCOUNTER — Ambulatory Visit (INDEPENDENT_AMBULATORY_CARE_PROVIDER_SITE_OTHER): Payer: Commercial Managed Care - HMO | Admitting: Internal Medicine

## 2013-09-01 VITALS — BP 140/72 | HR 84 | Ht 59.0 in | Wt 204.0 lb

## 2013-09-01 DIAGNOSIS — R0609 Other forms of dyspnea: Secondary | ICD-10-CM

## 2013-09-01 DIAGNOSIS — R0600 Dyspnea, unspecified: Secondary | ICD-10-CM

## 2013-09-01 DIAGNOSIS — R0989 Other specified symptoms and signs involving the circulatory and respiratory systems: Secondary | ICD-10-CM

## 2013-09-01 NOTE — Progress Notes (Signed)
Subjective:    Patient ID: Kelsey Dunn, female    DOB: 08/26/1940, 73 y.o.   MRN: [id number]  HPI     IOV 09/01/2013  Chief Complaint  Patient presents with  . Pulmonary Consult    Referred by Dr. Clifton James. Pt had PFT 08/31/2013.   73 year old female referred for dyspnea (wife of my patient Kymberlyn Eckford) She is known to have coronary artery disease status post drug eluting stent in 2011. She has a history of negative cardiac cath and Myoview in February 2015. She status post pacemaker for atrioventricular block in November 2013. She also has vertebrobasilar ischemia with severe basilar artery stenosis and status post stent and also left carotid artery stenosis 100% according to the history.  She is obese Body mass index is 41.18 kg/(m^2). and apparently has lost 50 pounds of weight. At baseline for the last 2 years she is able to do daily exercise walking 3 miles everyday on a treadmill. For the last 6 months has had worsening shortness of breath and able to only half a mile a 1 mile. This is relieved by rest. Sometimes even after groceries or walking to the mailbox and back she feels dyspneic. Periodically this associated with heaviness in the chest  Relevant workup  showed  - Cardiac catheterization question of July 2015 shows single-vessel coronary artery disease with patent stent right coronary artery, normal LV function. Etiology for chest pain was felt to be noncardiac  -CT scan of the chest 07/27/2013: Was negative for pulmonary embolism and interstitial lung disease  Pulmonary function test 08/31/2013 shows FVC of 2.2 L/92%. FEV1 of 1.9 he does have 105%. Ratio of 84. There is no pulse bronchodilators once. Her lung capacity 4.1 L/95%. DLCO is 22.5/128% and elevated. Overall normal pulmonary function tests except the diffusion capacity is elevated   -  reports that she has never smoked. She has never used smokeless tobacco.  0 Body mass index is 41.18 kg/(m^2).    Past  Medical History  Diagnosis Date  . Hypothyroidism 1990  . Labile hypertension 08/1991  . HLD (hyperlipidemia) 01/1997  . Fibromyalgia   . Bladder spasms   . Basilar artery stenosis     90%  . CAD (coronary artery disease)     a. 06/2009 cath: severe stenosis RCA with placement drug eluting stent;  b. 08/2011 cath: normal left main, luminal LAD irregularities, luminal LCx irregularities, luminal RCA irregularities and no ISR to mid-RCA DES, LVEF 60-65%; c. 02/2013 nonischemic CL, EF 79%.  . IC (interstitial cystitis)   . Obesity   . Hypertension   . Blood transfusion 1960's    w/childbirth  . CVA (cerebral infarction) December 2012  . Pacemaker 11/11/2011    dual chamber pacemaker  . Second degree Mobitz II AV block   . Family history of anesthesia complication     "first cousin had very fever and almost died" (01-Mar-2013)  . Malignant hyperthermia     "told to warn everybody before I had surgery that my 1st cousin had malignant hyperthermia; I've had symptoms of it too" (03-01-2013)  . Pneumonia     "twice" (03/01/2013)  . Stroke     "they tell me I've had some mini strokes; nothing that's bothered me though" (2013/03/01)  . Arthritis     "fingers" (03/01/13)     Family History  Problem Relation Age of Onset  . Lung cancer Father   . Heart attack Mother   . Lymphoma Mother  s/p chemo  . Stroke Mother     x 2  . Diabetes Brother   . Colon cancer Neg Hx   . Breast cancer Maternal Aunt      History   Social History  . Marital Status: Married    Spouse Name: N/A    Number of Children: 1  . Years of Education: N/A   Occupational History  . Bookkeeper-part time   . retired    Social History Main Topics  . Smoking status: Never Smoker   . Smokeless tobacco: Never Used  . Alcohol Use: No  . Drug Use: No  . Sexual Activity: Not Currently    Birth Control/ Protection: Post-menopausal   Other Topics Concern  . 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 Anesthetics, Amide Other (See Comments)    REACTION:FEVER, SYNCOPE  . Codeine Nausea And Vomiting    Reaction:Hallucinations  . Nitrofurantoin Nausea Only and Other (See Comments)    REACTION: Hallucinations  . Orphenadrine Citrate Nausea And Vomiting and Other (See Comments)    REACTION: Hallucination  . Pentosan Polysulfate Sodium Rash and Other (See Comments)    REACTION: rash/ severe headache     Outpatient Prescriptions Prior to Visit  Medication Sig Dispense Refill  . amLODipine (NORVASC) 5 MG tablet Take 5 mg by mouth daily.      Marland Kitchen aspirin EC 81 MG tablet Take 81 mg by mouth every morning.      . clopidogrel (PLAVIX) 75 MG tablet TAKE 1 TABLET BY MOUTH EVERY DAY  90 tablet  1  . Coenzyme Q10 200 MG capsule Take 200 mg by mouth 2 (two) times daily.      . furosemide (LASIX) 40 MG tablet Take 40 mg by mouth daily as needed for edema.      . lansoprazole (PREVACID) 15 MG capsule Take 15 mg by mouth as needed (reflux).       Marland Kitchen levothyroxine (SYNTHROID, LEVOTHROID) 100 MCG tablet Take 100 mcg by mouth daily before breakfast.       . losartan-hydrochlorothiazide (HYZAAR) 100-25 MG per tablet TAKE 1 TABLET BY MOUTH EVERY DAY  90 tablet  1  . nitroGLYCERIN (NITROSTAT) 0.4 MG SL tablet Place 0.4 mg under the tongue every 5 (five) minutes as needed. For chest pain      . Omega-3 Fatty Acids (FISH OIL BURP-LESS) 500 MG CAPS Take 1,000 mg by mouth 2 (two) times daily.       . Pumpkin Seed-Soy Germ (AZO BLADDER CONTROL/GO-LESS) CAPS Take 2 capsules by mouth daily as needed (for spasms).      . simvastatin (ZOCOR) 10 MG tablet TAKE 1 TABLET BY MOUTH EVERY DAY AT 6PM  90 tablet  1  . clopidogrel (PLAVIX) 75 MG tablet Take 75 mg by mouth daily with breakfast.      . losartan-hydrochlorothiazide (HYZAAR) 100-25 MG per tablet Take 1 tablet by mouth every morning.        . simvastatin (ZOCOR) 10 MG tablet Take 10 mg by mouth daily at 6 PM.       No facility-administered medications prior to visit.      Review of Systems  Constitutional: Negative for fever and unexpected weight change.  HENT: Positive for trouble swallowing. Negative for congestion, dental problem, ear pain, nosebleeds, postnasal drip, rhinorrhea,  sinus pressure, sneezing and sore throat.   Eyes: Negative for redness and itching.  Respiratory: Positive for cough, chest tightness and shortness of breath. Negative for wheezing.   Cardiovascular: Positive for chest pain. Negative for palpitations and leg swelling.  Gastrointestinal: Negative for nausea and vomiting.  Genitourinary: Negative for dysuria.  Musculoskeletal: Negative for joint swelling.  Skin: Negative for rash.  Neurological: Negative for headaches.  Hematological: Does not bruise/bleed easily.  Psychiatric/Behavioral: Negative for dysphoric mood. The patient is not nervous/anxious.        Objective:   Physical Exam  Vitals reviewed. Constitutional: She is oriented to person, place, and time. She appears well-developed and well-nourished. No distress.  Body mass index is 41.18 kg/(m^2).   HENT:  Head: Normocephalic and atraumatic.  Right Ear: External ear normal.  Left Ear: External ear normal.  Mouth/Throat: Oropharynx is clear and moist. No oropharyngeal exudate.  Eyes: Conjunctivae and EOM are normal. Pupils are equal, round, and reactive to light. Right eye exhibits no discharge. Left eye exhibits no discharge. No scleral icterus.  Neck: Normal range of motion. Neck supple. No JVD present. No tracheal deviation present. No thyromegaly present.  Cardiovascular: Normal rate, regular rhythm, normal heart sounds and intact distal pulses.  Exam reveals no gallop and no friction rub.   No murmur heard. Pulmonary/Chest: Effort normal and breath sounds normal. No respiratory distress. She has no wheezes. She has no  rales. She exhibits no tenderness.  Abdominal: Soft. Bowel sounds are normal. She exhibits no distension and no mass. There is no tenderness. There is no rebound and no guarding.  Musculoskeletal: Normal range of motion. She exhibits no edema and no tenderness.  Lymphadenopathy:    She has no cervical adenopathy.  Neurological: She is alert and oriented to person, place, and time. She has normal reflexes. No cranial nerve deficit. She exhibits normal muscle tone. Coordination normal.  Skin: Skin is warm and dry. No rash noted. She is not diaphoretic. No erythema. No pallor.  Psychiatric: She has a normal mood and affect. Her behavior is normal. Judgment and thought content normal.    Filed Vitals:   09/01/13 1439  BP: 140/72  Pulse: 84  Height:  (1.499 m)  Weight: 204 lb (92.534 kg)  SpO2: 95%         Assessment & Plan:  #Shortness of breath  - currently do not  Understand why you are short of breath  - Wide range of possibilities - Please do CPST bike test   #followup  - return to see me or NP after CPST bike test  - <  6  weeks

## 2013-09-01 NOTE — Patient Instructions (Addendum)
#  Shortness of breath  - currently do not  Understand why you are short of breath  - Wide range of possibilities - Please do CPST bike test   #followup  - return to see me or NP after CPST bike test  - <  6  weeks

## 2013-09-02 DIAGNOSIS — R0600 Dyspnea, unspecified: Secondary | ICD-10-CM | POA: Insufficient documentation

## 2013-09-02 NOTE — Assessment & Plan Note (Signed)
#  Shortness of breath  - currently do not  Understand why you are short of breath but wide range of possibilites: including obesity, diastolic dysfunction, exercise induced bronchospasm, and chrontropic incompetence  - Wide range of possibilities - Please do CPST bike test   #followup  - return to see me or NP after CPST bike test  - <  6  weeks

## 2013-09-08 ENCOUNTER — Ambulatory Visit (HOSPITAL_COMMUNITY): Payer: Medicare HMO | Attending: Internal Medicine

## 2013-09-08 DIAGNOSIS — I2510 Atherosclerotic heart disease of native coronary artery without angina pectoris: Secondary | ICD-10-CM | POA: Insufficient documentation

## 2013-09-08 DIAGNOSIS — E785 Hyperlipidemia, unspecified: Secondary | ICD-10-CM | POA: Diagnosis not present

## 2013-09-08 DIAGNOSIS — R0989 Other specified symptoms and signs involving the circulatory and respiratory systems: Principal | ICD-10-CM | POA: Insufficient documentation

## 2013-09-08 DIAGNOSIS — E039 Hypothyroidism, unspecified: Secondary | ICD-10-CM | POA: Diagnosis not present

## 2013-09-08 DIAGNOSIS — Z9861 Coronary angioplasty status: Secondary | ICD-10-CM | POA: Insufficient documentation

## 2013-09-08 DIAGNOSIS — R0609 Other forms of dyspnea: Secondary | ICD-10-CM | POA: Diagnosis present

## 2013-09-08 DIAGNOSIS — R0600 Dyspnea, unspecified: Secondary | ICD-10-CM

## 2013-09-08 DIAGNOSIS — N3289 Other specified disorders of bladder: Secondary | ICD-10-CM | POA: Insufficient documentation

## 2013-09-08 DIAGNOSIS — R110 Nausea: Secondary | ICD-10-CM | POA: Insufficient documentation

## 2013-09-08 DIAGNOSIS — Z8673 Personal history of transient ischemic attack (TIA), and cerebral infarction without residual deficits: Secondary | ICD-10-CM | POA: Insufficient documentation

## 2013-09-08 DIAGNOSIS — I10 Essential (primary) hypertension: Secondary | ICD-10-CM | POA: Insufficient documentation

## 2013-09-08 DIAGNOSIS — R0789 Other chest pain: Secondary | ICD-10-CM | POA: Insufficient documentation

## 2013-09-08 DIAGNOSIS — IMO0001 Reserved for inherently not codable concepts without codable children: Secondary | ICD-10-CM | POA: Diagnosis not present

## 2013-09-08 DIAGNOSIS — I6529 Occlusion and stenosis of unspecified carotid artery: Secondary | ICD-10-CM | POA: Insufficient documentation

## 2013-09-25 ENCOUNTER — Telehealth: Payer: Self-pay | Admitting: Cardiovascular Disease

## 2013-10-01 DIAGNOSIS — R0602 Shortness of breath: Secondary | ICD-10-CM

## 2013-10-03 ENCOUNTER — Ambulatory Visit (INDEPENDENT_AMBULATORY_CARE_PROVIDER_SITE_OTHER): Payer: Commercial Managed Care - HMO | Admitting: Cardiovascular Disease

## 2013-10-03 ENCOUNTER — Encounter: Payer: Self-pay | Admitting: Cardiovascular Disease

## 2013-10-03 VITALS — BP 130/82 | HR 61 | Ht 59.0 in | Wt 201.0 lb

## 2013-10-03 DIAGNOSIS — I2510 Atherosclerotic heart disease of native coronary artery without angina pectoris: Secondary | ICD-10-CM

## 2013-10-03 DIAGNOSIS — I10 Essential (primary) hypertension: Secondary | ICD-10-CM

## 2013-10-03 DIAGNOSIS — I441 Atrioventricular block, second degree: Secondary | ICD-10-CM | POA: Diagnosis not present

## 2013-10-03 NOTE — Patient Instructions (Signed)
Your physician wants you to follow-up in:  6 months. You will receive a reminder letter in the mail two months in advance. If you don't receive a letter, please call our office to schedule the follow-up appointment.   

## 2013-10-03 NOTE — Progress Notes (Signed)
History of Present Illness: 73 yo WF with h/o CAD with DES RCA in June 2011, HTN, hyperlipidemia, hypothyroidism, AV block s/p PPM, CVA and basilar artery stenosis here today for follow up. Left heart cath on 06/13/09 and she was found to have a severe stenosis in the RCA. There was minor disease in the LAD and Circumflex. She was brought back in for PCI on 06/19/09 and I placed a single DES in the RCA. She was admitted to [facility] 08/10/11 with dizziness, chest pain and repeat cath showed stable disease. She was observed to have second degree AV block demonstrated with RBBB. Notes during that hospital visit support multiple consecutive P waves not conducted. Her coreg was stopped. She was discharged and wore an event monitor. This did not document arrhythmias or advanced AV block. There were no episodes of AV block documented. GXT was performed in our office 09/01/11. She was observed during the study to have a LBBB with exertion. She developed exertional symptoms of fatigue, SOB, and CP which corresponded to her LBBB. She was seen by Dr. Johney Frame and continued to have episodes of palpitations and dizziness with moderate activity. A permanent pacemaker was placed on 11/11/11 by Dr. Johney Frame. Last EP visit 11/14/12 and her pacemaker was functioning well. She has been undergoing cerebral angiograms for basilar artery stenosis and has had angiplasty of the basilar artery in August 2010. She is known to have an occluded left common carotid artery. She was admitted to [facility] December 2012 with a CVA. She is being followed by Neurology. Admitted to [facility] February 2015 with chest pain. Stress myoview 2/15 with no ischemia, LVEF-79%. Admitted to [facility] July 2015 with chest pain. Cardiac cath 07/27/13 with stable CAD (see below).   She is here today for follow up. She is not feeling well. She describes chest pressure with minimal exertion. She was vacuuming last week and felt severe burning in her chest with dyspnea. She  checked her pulse and it was 60. This is like prior pain during treadmill tests. Cardiopulmonary stress test 09/12/13 with maximum HR 115 bpm with 7 minutes exercise. She has an appt to see Dr. Johney Frame on 10/11/13.   Primary Care Physician: Dr. Para March   Last Lipid Profile:Lipid Panel     Component Value Date/Time   CHOL 132 02/28/2013 0426   TRIG 162* 02/28/2013 0426   HDL 38* 02/28/2013 0426   CHOLHDL 3.5 02/28/2013 0426   VLDL 32 02/28/2013 0426   LDLCALC 62 02/28/2013 0426   Past Medical History  Diagnosis Date  . Hypothyroidism 1990  . Labile hypertension 08/1991  . HLD (hyperlipidemia) 01/1997  . Fibromyalgia   . Bladder spasms   . Basilar artery stenosis     90%  . CAD (coronary artery disease)     a. 06/2009 cath: severe stenosis RCA with placement drug eluting stent;  b. 08/2011 cath: normal left main, luminal LAD irregularities, luminal LCx irregularities, luminal RCA irregularities and no ISR to mid-RCA DES, LVEF 60-65%; c. 02/2013 nonischemic CL, EF 79%.  . IC (interstitial cystitis)   . Obesity   . Hypertension   . Blood transfusion 1960's    w/childbirth  . CVA (cerebral infarction) December 2012  . Pacemaker 11/11/2011    dual chamber pacemaker  . Second degree Mobitz II AV block   . Family history of anesthesia complication     "first cousin had very fever and almost died" (2013-03-13)  . Malignant hyperthermia     "told  to warn everybody before I had surgery that my 1st cousin had malignant hyperthermia; I've had symptoms of it too" (02/27/2013)  . Pneumonia     "twice" (02/27/2013)  . Stroke     "they tell me I've had some mini strokes; nothing that's bothered me though" (02/27/2013)  . Arthritis     "fingers" (02/27/2013)    Past Surgical History  Procedure Laterality Date  . Bladder suspension  1974  . Cholecystectomy  1985  . Cardiac catheterization  1993    normal  . Renal angioplasty  1993    normal  . Carotid US  11/93    Occl L comm carotid  . Visual evoked  response  11/93; 1/01    normal  . US renal/aorta  12/93    Normal  . Emg  1/01    LE, RUE normal  . Carotid US  4/98    L comm occl R-ok  . Carotid US  01/30/03    L occl  R less 40%  . Dexa  10/15/04    Troch -0.4 o/w pos  . Carotid arteriogram      bilat  . Bilateral vert & subclavian angiogram  07/19/08    85-90% stenosis mid basilar art; occluded left vert artery; occluded left comm carotid artery w/ collat flow  . Basilar artery angioplasty  09/04/08    sm right brachial hematoma  . Cath single vess dz  06/13/09    70% stenosis RCA EF 65-70%  . Coronary angioplasty with stent placement  06/19/09  . Cataract extraction w/ intraocular lens implant Bilateral 2007-2008  . Eye surgery    . Vaginal hysterectomy  1974    dysmennorhea  . Insert / replace / remove pacemaker  11/11/2011    MDT Adapta L implanted by Dr Johney Frame. [employer]    Current Outpatient Prescriptions  Medication Sig Dispense Refill  . amLODipine (NORVASC) 5 MG tablet Take 5 mg by mouth daily.      Marland Kitchen aspirin EC 81 MG tablet Take 81 mg by mouth every morning.      . clopidogrel (PLAVIX) 75 MG tablet TAKE 1 TABLET BY MOUTH EVERY DAY  90 tablet  1  . Coenzyme Q10 200 MG capsule Take 200 mg by mouth 2 (two) times daily.      . furosemide (LASIX) 40 MG tablet Take 40 mg by mouth daily as needed for edema.      . lansoprazole (PREVACID) 15 MG capsule Take 15 mg by mouth as needed (reflux).       Marland Kitchen levothyroxine (SYNTHROID, LEVOTHROID) 100 MCG tablet Take 100 mcg by mouth daily before breakfast.       . losartan-hydrochlorothiazide (HYZAAR) 100-25 MG per tablet TAKE 1 TABLET BY MOUTH EVERY DAY  90 tablet  1  . nitroGLYCERIN (NITROSTAT) 0.4 MG SL tablet Place 0.4 mg under the tongue every 5 (five) minutes as needed. For chest pain      . Omega-3 Fatty Acids (FISH OIL BURP-LESS) 500 MG CAPS Take 1,000 mg by mouth 2 (two) times daily.       . Phenazopyridine HCl (AZO TABS PO) Take by mouth.      . simvastatin (ZOCOR) 10 MG  tablet TAKE 1 TABLET BY MOUTH EVERY DAY AT 6PM  90 tablet  1   No current facility-administered medications for this visit.    Allergies  Allergen Reactions  . Anesthetics, Amide Other (See Comments)    REACTION:FEVER, SYNCOPE  . Codeine Nausea  And Vomiting    Reaction:Hallucinations  . Nitrofurantoin Nausea Only and Other (See Comments)    REACTION: Hallucinations  . Orphenadrine Citrate Nausea And Vomiting and Other (See Comments)    REACTION: Hallucination  . Pentosan Polysulfate Sodium Rash and Other (See Comments)    REACTION: rash/ severe headache    History   Social History  . Marital Status: Married    Spouse Name: N/A    Number of Children: 1  . Years of Education: N/A   Occupational History  . Bookkeeper-part time   . retired    Social History Main Topics  . Smoking status: Never Smoker   . Smokeless tobacco: Never Used  . Alcohol Use: No  . Drug Use: No  . Sexual Activity: Not Currently    Birth Control/ Protection: Post-menopausal   Other Topics Concern  . 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 Lung cancer Father   . Heart attack Mother   . Lymphoma Mother     s/p chemo  . Stroke Mother     x 2  . Diabetes Brother   . Colon cancer Neg Hx   . Breast cancer Maternal Aunt     Review of Systems:  As stated in the HPI and otherwise negative.   BP 130/82  Pulse 61  Ht 4\' 11"  (1.499 m)  Wt 201 lb (91.173 kg)  BMI 40.58 kg/m2  SpO2 98%  Physical Examination: General: Well developed, well nourished, NAD HEENT: OP clear, mucus membranes moist SKIN: warm, dry. No rashes. Neuro: No focal deficits Musculoskeletal: Muscle strength 5/5 all ext Psychiatric: Mood and affect normal Neck: No JVD, no carotid bruits, no thyromegaly, no lymphadenopathy. Lungs:Clear bilaterally, no wheezes,  rhonci, crackles Cardiovascular: Regular rate and rhythm. No murmurs, gallops or rubs. Abdomen:Soft. Bowel sounds present. Non-tender.  Extremities: No lower extremity edema. Pulses are 2 + in the bilateral DP/PT.  Cardiac cath 07/27/13: Left main: No obstructive disease.  Left Anterior Descending Artery: Large caliber vessel that courses to the apex. The mid vessel has diffuse 20% stenosis. There is a very small caliber diagonal branch with proximal 40% stenosis. There are no flow limiting lesions noted.  Circumflex Artery: Moderate caliber vessel with moderate caliber obtuse marginal branch. No obstructive disease.  Right Coronary Artery: Large caliber vessel with 20% mid stenosis, patent distal stent without restenosis.  Left Ventricular Angiogram: LVEF=65-70%.  Impression:  1. Single vessel CAD with patent stent RCA  2. Normal LV function  3. Non-cardiac chest pain   Assessment and Plan:   1. CAD: Stable. Recent cath July 2015 with stable CAD. I do not believe that her exertional symptoms are related to her CAD. No changes today. Continue Plavix and statin. Continue Lasix 40 mg po Qdaily prn bloating, fluid retention.   2. High grade symptomatic AV block: s/p PPM 11/10/11 per Dr. Johney FrameAllred. Followed in EP clinic. Now having exertional dyspnea and chest burning similar to symptoms before her pacemaker was placed. It is not clear if this is a rate related bundle branch block or other arrythmia. Will have her discuss with Dr. Johney FrameAllred at f/u next week.   3. HTN: BP has been well controlled at home. No changes.

## 2013-10-11 ENCOUNTER — Ambulatory Visit (INDEPENDENT_AMBULATORY_CARE_PROVIDER_SITE_OTHER): Payer: Commercial Managed Care - HMO | Admitting: Internal Medicine

## 2013-10-11 ENCOUNTER — Encounter: Payer: Self-pay | Admitting: Internal Medicine

## 2013-10-11 VITALS — BP 150/82 | HR 61 | Ht 59.0 in | Wt 201.0 lb

## 2013-10-11 DIAGNOSIS — R0602 Shortness of breath: Secondary | ICD-10-CM

## 2013-10-11 DIAGNOSIS — I441 Atrioventricular block, second degree: Secondary | ICD-10-CM

## 2013-10-11 LAB — MDC_IDC_ENUM_SESS_TYPE_INCLINIC
Battery Impedance: 135 Ohm
Battery Remaining Longevity: 146 mo
Battery Voltage: 2.79 V
Brady Statistic AP VS Percent: 30 %
Brady Statistic AS VP Percent: 5 %
Lead Channel Impedance Value: 430 Ohm
Lead Channel Pacing Threshold Amplitude: 0.5 V
Lead Channel Pacing Threshold Amplitude: 1 V
Lead Channel Pacing Threshold Pulse Width: 0.4 ms
Lead Channel Sensing Intrinsic Amplitude: 5.6 mV
Lead Channel Setting Pacing Amplitude: 2.5 V
Lead Channel Setting Pacing Pulse Width: 0.4 ms
Lead Channel Setting Sensing Sensitivity: 2 mV
MDC IDC MSMT LEADCHNL RA PACING THRESHOLD PULSEWIDTH: 0.4 ms
MDC IDC MSMT LEADCHNL RA SENSING INTR AMPL: 4 mV
MDC IDC MSMT LEADCHNL RV IMPEDANCE VALUE: 544 Ohm
MDC IDC SESS DTM: 20151007114246
MDC IDC SET LEADCHNL RA PACING AMPLITUDE: 2 V
MDC IDC STAT BRADY AP VP PERCENT: 0 %
MDC IDC STAT BRADY AS VS PERCENT: 65 %

## 2013-10-11 NOTE — Patient Instructions (Signed)
Your physician wants you to follow-up in: 12 months with Dr Allred You will receive a reminder letter in the mail two months in advance. If you don't receive a letter, please call our office to schedule the follow-up appointment.   Remote monitoring is used to monitor your Pacemaker of ICD from home. This monitoring reduces the number of office visits required to check your device to one time per year. It allows us to keep an eye on the functioning of your device to ensure it is working properly. You are scheduled for a device check from home on 01/11/14. You may send your transmission at any time that day. If you have a wireless device, the transmission will be sent automatically. After your physician reviews your transmission, you will receive a postcard with your next transmission date.    

## 2013-10-11 NOTE — Progress Notes (Signed)
PCP: Crawford Givens, MD Primary Cardiologist:  Dr [name] is a 73 y.o. female who presents today for routine electrophysiology followup.  Since last being seen in our clinic, the patient reports doing reasonably well. She no longer has exertional presyncope but finds that her exertional SOB related to prolonged AV conduction and intermittent pacing is a little worse.  These worsening symptoms appear to have occurred in the setting of her daughter recently being diagnosed with both MS and breast cancer and her husbands recent struggles with CAD.  She has significant family stress presently.  Today, she denies symptoms of palpitations, chest pain,   lower extremity edema, dizziness, presyncope, or syncope.  The patient is otherwise without complaint today.   Past Medical History  Diagnosis Date  . Hypothyroidism 1990  . Labile hypertension 08/1991  . HLD (hyperlipidemia) 01/1997  . Fibromyalgia   . Bladder spasms   . Basilar artery stenosis     90%  . CAD (coronary artery disease)     a. 06/2009 cath: severe stenosis RCA with placement drug eluting stent;  b. 08/2011 cath: normal left main, luminal LAD irregularities, luminal LCx irregularities, luminal RCA irregularities and no ISR to mid-RCA DES, LVEF 60-65%; c. 02/2013 nonischemic CL, EF 79%.  . IC (interstitial cystitis)   . Obesity   . Hypertension   . Blood transfusion 1960's    w/childbirth  . CVA (cerebral infarction) December 2012  . Pacemaker 11/11/2011    dual chamber pacemaker  . Second degree Mobitz II AV block   . Family history of anesthesia complication     "first cousin had very fever and almost died" (03/24/2013)  . Malignant hyperthermia     "told to warn everybody before I had surgery that my 1st cousin had malignant hyperthermia; I've had symptoms of it too" (2013-03-24)  . Pneumonia     "twice" (2013/03/24)  . Stroke     "they tell me I've had some mini strokes; nothing that's bothered me though"  (2013/03/24)  . Arthritis     "fingers" (Mar 24, 2013)   Past Surgical History  Procedure Laterality Date  . Bladder suspension  1974  . Cholecystectomy  1985  . Cardiac catheterization  1993    normal  . Renal angioplasty  1993    normal  . Carotid US  11/93    Occl L comm carotid  . Visual evoked response  11/93; 1/01    normal  . US renal/aorta  12/93    Normal  . Emg  1/01    LE, RUE normal  . Carotid US  4/98    L comm occl R-ok  . Carotid US  01/30/03    L occl  R less 40%  . Dexa  10/15/04    Troch -0.4 o/w pos  . Carotid arteriogram      bilat  . Bilateral vert & subclavian angiogram  07/19/08    85-90% stenosis mid basilar art; occluded left vert artery; occluded left comm carotid artery w/ collat flow  . Basilar artery angioplasty  09/04/08    sm right brachial hematoma  . Cath single vess dz  06/13/09    70% stenosis RCA EF 65-70%  . Coronary angioplasty with stent placement  06/19/09  . Cataract extraction w/ intraocular lens implant Bilateral 2007-2008  . Eye surgery    . Vaginal hysterectomy  1974    dysmennorhea  . Insert / replace / remove pacemaker  11/11/2011  MDT Adapta L implanted by Dr Johney FrameAllred. [employer]    Current Outpatient Prescriptions  Medication Sig Dispense Refill  . amLODipine (NORVASC) 5 MG tablet Take 5 mg by mouth daily.      Marland Kitchen. aspirin EC 81 MG tablet Take 81 mg by mouth every morning.      . clopidogrel (PLAVIX) 75 MG tablet TAKE 1 TABLET BY MOUTH EVERY DAY  90 tablet  1  . Coenzyme Q10 200 MG capsule Take 200 mg by mouth 2 (two) times daily.      . furosemide (LASIX) 40 MG tablet Take 40 mg by mouth daily as needed for edema.      . lansoprazole (PREVACID) 15 MG capsule Take 15 mg by mouth as needed (reflux).       Marland Kitchen. levothyroxine (SYNTHROID, LEVOTHROID) 100 MCG tablet Take 100 mcg by mouth daily before breakfast.       . losartan-hydrochlorothiazide (HYZAAR) 100-25 MG per tablet TAKE 1 TABLET BY MOUTH EVERY DAY  90 tablet  1  .  nitroGLYCERIN (NITROSTAT) 0.4 MG SL tablet Place 0.4 mg under the tongue every 5 (five) minutes as needed (MAX 3 TABLETS). For chest pain      . Omega-3 Fatty Acids (FISH OIL BURP-LESS) 500 MG CAPS Take 1,000 mg by mouth 2 (two) times daily.       . Phenazopyridine HCl (AZO TABS PO) Take 1 tablet by mouth daily as needed (bladder spasms).       . simvastatin (ZOCOR) 10 MG tablet TAKE 1 TABLET BY MOUTH EVERY DAY AT 6PM  90 tablet  1   No current facility-administered medications for this visit.    Physical Exam: Filed Vitals:   10/11/13 1008  BP: 150/82  Pulse: 61  Height: 4\' 11"  (1.499 m)  Weight: 201 lb (91.173 kg)    GEN- The patient is well appearing, alert and oriented x 3 today.   Head- normocephalic, atraumatic Eyes-  Sclera clear, conjunctiva pink Ears- hearing intact Oropharynx- clear Lungs- Clear to ausculation bilaterally, normal work of breathing Chest- pacemaker pocket is well healed Heart- Regular rate and rhythm, no murmurs, rubs or gallops, PMI not laterally displaced GI- soft, NT, ND, + BS Extremities- no clubbing, cyanosis, or edema  Pacemaker interrogation- reviewed in detail today,  See PACEART report  Assessment and Plan:  1. Mobitz II second degree AV block Normal pacemaker function See Pace Art report No changes today She does not V pace very often.  Today upon ambulating in the office, she did have some SOB with V pacing, I have left MVP on but has shortened AV from 240 to 210 msec.  I initially tried lengthening her AV which made symptoms worse.  With shortening her AV, she feels "a little better".  I do not have any additional options of optimization at this time.  2. Exertional shortness of breath This is a chronic issue.  Changes as above No changes  [facility] Return in 1 year

## 2013-10-12 ENCOUNTER — Ambulatory Visit (INDEPENDENT_AMBULATORY_CARE_PROVIDER_SITE_OTHER): Payer: Commercial Managed Care - HMO

## 2013-10-12 DIAGNOSIS — Z23 Encounter for immunization: Secondary | ICD-10-CM

## 2013-10-13 ENCOUNTER — Ambulatory Visit (INDEPENDENT_AMBULATORY_CARE_PROVIDER_SITE_OTHER): Payer: Commercial Managed Care - HMO | Admitting: Internal Medicine

## 2013-10-13 ENCOUNTER — Telehealth: Payer: Self-pay | Admitting: Internal Medicine

## 2013-10-13 ENCOUNTER — Encounter: Payer: Self-pay | Admitting: Internal Medicine

## 2013-10-13 VITALS — BP 158/86 | HR 78 | Ht 59.0 in | Wt 204.0 lb

## 2013-10-13 DIAGNOSIS — R0600 Dyspnea, unspecified: Secondary | ICD-10-CM

## 2013-10-13 NOTE — Progress Notes (Signed)
Subjective:    Patient ID: Kelsey Dunn, female    DOB: 1940-07-08, 73 y.o.   MRN: [id number]  HPI   I     IOV 09/01/2013  Chief Complaint  Patient presents with  . Pulmonary Consult    Referred by Dr. Angelena Form. Pt had PFT 08/31/2013.   73 year old female referred for dyspnea (wife of my patient Fruma Africa) She is known to have coronary artery disease status post drug eluting stent in 2011. She has a history of negative cardiac cath and Myoview in February 2015. She status post pacemaker for atrioventricular block in November 2013. She also has vertebrobasilar ischemia with severe basilar artery stenosis and status post stent and also left carotid artery stenosis 100% according to the history.  She is obese Body mass index is 41.18 kg/(m^2). and apparently has lost 50 pounds of weight. At baseline for the last 2 years she is able to do daily exercise walking 3 miles everyday on a treadmill. For the last 6 months has had worsening shortness of breath and able to only half a mile a 1 mile. This is relieved by rest. Sometimes even after groceries or walking to the mailbox and back she feels dyspneic. Periodically this associated with heaviness in the chest  Relevant workup  showed  - Cardiac catheterization question of July 2015 shows single-vessel coronary artery disease with patent stent right coronary artery, normal LV function. Etiology for chest pain was felt to be noncardiac  -CT scan of the chest 07/27/2013: Was negative for pulmonary embolism and interstitial lung disease  Pulmonary function test 08/31/2013 shows FVC of 2.2 L/92%. FEV1 of 1.9 he does have 105%. Ratio of 84. There is no pulse bronchodilators once. Her lung capacity 4.1 L/95%. DLCO is 22.5/128% and elevated. Overall normal pulmonary function tests except the diffusion capacity is elevated   -  reports that she has never smoked. She has never used smokeless tobacco.  0 Body mass index is 41.18  kg/(m^2).   REC #Shortness of breath  - currently do not  Understand why you are short of breath  - Wide range of possibilities - Please do CPST bike test   #followup  - return to see me or NP after CPST bike test  - <  6  Weeks   OV 10/13/2013  Chief Complaint  Patient presents with  . Follow-up    Pt states Dr. Rayann Heman changed her pacemaker and pt states when she is active she becomes SOB and has chest tightness. Pt denies significant cough.    REturns after CPST Test   - still has dyspnea. I learned today she had pacer 2 years ago for ? Mobitz type 2 block. For first 18 months dyspnea better . Now dyspnea worse x 6 months. CPST done for this sept 2015 shows both obesity and chronotropic incompetence with HRR 35/min. She saw Dr Rayann Heman EP 10/715 and said her pacer has been :"tweaked". REview of notes shws dyspnea with V pacing and worse by lengthening her AV and improved shortening her AV. Today  10/13/2013 she is not sure if the changes helped. Of note she is on calcium channel blocker norvasc 77m per day.    - no no new issues  Review of Systems  Constitutional: Negative for fever and unexpected weight change.  HENT: Negative for congestion, dental problem, ear pain, nosebleeds, postnasal drip, rhinorrhea, sinus pressure, sneezing, sore throat and trouble swallowing.   Eyes: Negative for redness and itching.  Respiratory:  Positive for chest tightness and shortness of breath. Negative for cough and wheezing.   Cardiovascular: Negative for palpitations and leg swelling.  Gastrointestinal: Negative for nausea and vomiting.  Genitourinary: Negative for dysuria.  Musculoskeletal: Negative for joint swelling.  Skin: Negative for rash.  Neurological: Positive for dizziness. Negative for headaches.  Hematological: Does not bruise/bleed easily.  Psychiatric/Behavioral: Negative for dysphoric mood. The patient is not nervous/anxious.        Objective:   Physical Exam  Filed  Vitals:   10/13/13 1411  BP: 158/86  Pulse: 78  Height: _0  (1.499 m)  Weight: 204 lb (92.534 kg)  SpO2: 93%   disucssion  only visit      Assessment & Plan:  #SHortness of breath   - this is due to obesity and heart rate not responding fast enough - no lung issue  PLAN  - no further pulmonary  followup  - I will send message to both Dr Rayann Heman and Dr Angelena Form if you still should be on norvassc or if there is anything else for them to do  #FOllowup  - as needed  (> 50% of this 15 min visit spent in face to face counseling)   Dr. Brand Males, M.D., [facility].C.P Pulmonary and Critical Care Medicine Staff Physician [place] [facility] Pager: [phone], If no answer or between  15:00h - 7:00h: call [phone]  10/13/2013 3:12 PM

## 2013-10-13 NOTE — Telephone Encounter (Signed)
Kelsey Dunn on CPST sept 2015 before you saw her has both obesity and chronnotropic incompetence with HRR 35/min. She is not sure if your interventions of 10/11/13 are helping. Notice she is on norvsc $RemoveB'5mg'jPKtGPSW$ . Can this be contributing? Do you feel this needs to be stopped ? IF so, let me know  Thanks  Dr. Brand Males, M.D., [facility].C.P Pulmonary and Critical Care Medicine Staff Physician [place] [facility] Pager: [phone], If no answer or between  15:00h - 7:00h: call [phone]  10/13/2013 3:18 PM

## 2013-10-13 NOTE — Telephone Encounter (Signed)
I think it could be reasonable to try stopping her norvasc if you follow her BP closely.  I do not think that this will help.  She has an unusual rate related AV conduction block for which she has been very symptomatic for some time.  This did not improve with pacing.    Though she complained of new/ worsening SOB with activity, I actually thought that she was doing much better than on prior occasions.  She was previously SOB and also presyncopal with exertion.  Her presyncope has resolved.  I wondered if the recent issues with her daughter/ husbands health were also playing a part.  She has a lot going on right now.  I think that unfortunately she is about as optimized as I can get her.  Feel free to try off of novasc though.  It is certainly with a try.  Thanks for helping with her!

## 2013-10-13 NOTE — Telephone Encounter (Signed)
Agree that it may not help to stop Norvasc but worth a try. Thayer Ohmhris

## 2013-10-13 NOTE — Patient Instructions (Addendum)
#  Shortness of breath #SHortness of breath   - this is due to obesity and heart rate not responding fast enough - no lung issue  PLAN  - no further pulmonary  followup  - I will send message to both Dr Johney FrameAllred and Dr Clifton JamesMcAlhany if you still should be on norvassc  #FOllowup  - as needed

## 2013-10-16 ENCOUNTER — Telehealth: Payer: Self-pay | Admitting: Internal Medicine

## 2013-10-16 NOTE — Telephone Encounter (Signed)
Robynn PaneElise  Please let Hal HopeConnie R Trampe know that I d/w her 2 cardiologists and that a) we agree that weight is definitely contributing to dyspnea; she needs to lose weight; b ) heart rate issues also causing dyspnea  - she should stop norvasc though we are not optimistic this wil help and they feel will be tough to fix HR issues. So she is left with the challenge of losing weight

## 2013-10-16 NOTE — Telephone Encounter (Signed)
Thanks Thayer Ohmhris and Fayrene FearingJames.   To keep in perspective, CPST suggested significant component of dyspnea from obesity which she refuses to accept  No need to reply.   I will have her stop norvasc  Thanks  Dr. Kalman ShanMurali Roux Brandy, M.D., [facility]C.P Pulmonary and Critical Care Medicine Staff Physician [facility] [place] [facility] Pager: [phone], If no answer or between  15:00h - 7:00h: call [phone]  10/16/2013 8:24 AM

## 2013-10-19 ENCOUNTER — Telehealth: Payer: Self-pay | Admitting: Cardiovascular Disease

## 2013-10-19 NOTE — Telephone Encounter (Signed)
New message     Pt said her lung doctor told her to stop the amlodipine 5mg  daily.  She want to know what does Dr Clifton JamesMcAlhany think? Pt knows Dennie Bibleat is out today--OK to call tomorrow.

## 2013-10-19 NOTE — Telephone Encounter (Signed)
Called and spoke to pt. Informed pt of the recs per MR. Pt verbalized understanding and denied any further questions or concerns at this time. Pt aware to stop Norvasc. Nothing further needed.

## 2013-10-19 NOTE — Telephone Encounter (Signed)
Pt st that Dr. Marchelle Gearingamaswamy told her to stop her Norvasc. [name] st that she does not want to stop Norvasc, because she feels so much better now and does not want to change anything. "Don't fix it if it ain't broke." She st she is walking more, breathing better, and has lost 4 pounds since Dr. Johney FrameAllred "tweeked' her pacemaker settings last week.  She st she is angry and frustrated with her pulmonary doctor because "all he does is say she needs to lose weight, and she'll never be small." [name] says she is going to continue taking Norvasc until Dr. Clifton JamesMcAlhany tells her not to.   Routing to Dr. Clifton JamesMcAlhany for review and recommendations.

## 2013-10-20 NOTE — Telephone Encounter (Signed)
Dr. Marchelle Gearingamaswamy, Dr. Johney FrameAllred and I discussed her possibly holding her Norvasc but if she is feeling better while still taking Norvasc, would just continue taking as she is  for now. cdm

## 2013-10-20 NOTE — Telephone Encounter (Signed)
Spoke with pt and gave her information from Dr. Clifton JamesMcAlhany. She will continue Norvasc.

## 2013-11-09 ENCOUNTER — Other Ambulatory Visit: Payer: Self-pay | Admitting: Family Medicine

## 2013-11-27 ENCOUNTER — Encounter: Payer: Commercial Managed Care - HMO | Admitting: Internal Medicine

## 2013-12-08 ENCOUNTER — Encounter: Payer: Self-pay | Admitting: Family Medicine

## 2013-12-08 ENCOUNTER — Ambulatory Visit (INDEPENDENT_AMBULATORY_CARE_PROVIDER_SITE_OTHER): Payer: Commercial Managed Care - HMO | Admitting: Family Medicine

## 2013-12-08 VITALS — BP 166/78 | HR 84 | Temp 97.7°F | Wt 199.2 lb

## 2013-12-08 DIAGNOSIS — R300 Dysuria: Secondary | ICD-10-CM

## 2013-12-08 DIAGNOSIS — N3000 Acute cystitis without hematuria: Secondary | ICD-10-CM

## 2013-12-08 LAB — POCT URINALYSIS DIPSTICK
Bilirubin, UA: NEGATIVE
GLUCOSE UA: NEGATIVE
Ketones, UA: NEGATIVE
Nitrite, UA: NEGATIVE
PROTEIN UA: NEGATIVE
RBC UA: NEGATIVE
Urobilinogen, UA: 4
pH, UA: 6

## 2013-12-08 NOTE — Progress Notes (Signed)
Pre visit review using our clinic review tool, if applicable. No additional management support is needed unless otherwise documented below in the visit note.  Dysuria: yes duration of symptoms: a few days abdominal pain: no back pain:some lower back pain vomiting:no other concerns: took Azo with some relief.   Meds, vitals, and allergies reviewed.   ROS: See HPI.  Otherwise negative.    GEN: nad, alert and oriented HEENT: mucous membranes moist NECK: supple CV: rrr.  PULM: ctab, no inc wob ABD: soft, +bs, suprapubic area not tender EXT: no edema SKIN: no acute rash BACK: no CVA pain

## 2013-12-10 DIAGNOSIS — N390 Urinary tract infection, site not specified: Secondary | ICD-10-CM | POA: Insufficient documentation

## 2013-12-10 NOTE — Addendum Note (Signed)
Addended by: Joaquim NamUNCAN, Deiondra Denley S on: 12/10/2013 11:14 AM   Modules accepted: Medications

## 2013-12-10 NOTE — Assessment & Plan Note (Addendum)
Nontoxic, ucx, cipro rx written 250mg  po BID x3d, inc po fluids, fu prn.  She agrees.

## 2013-12-11 ENCOUNTER — Other Ambulatory Visit: Payer: Self-pay | Admitting: Family Medicine

## 2013-12-11 LAB — URINE CULTURE

## 2013-12-11 MED ORDER — AMOXICILLIN 875 MG PO TABS: 875.0000 mg | ORAL_TABLET | Freq: Two times a day (BID) | ORAL | Status: AC

## 2013-12-14 ENCOUNTER — Encounter (HOSPITAL_COMMUNITY): Payer: Self-pay | Admitting: Cardiovascular Disease

## 2013-12-24 ENCOUNTER — Other Ambulatory Visit: Payer: Self-pay | Admitting: Family Medicine

## 2014-01-11 ENCOUNTER — Ambulatory Visit (INDEPENDENT_AMBULATORY_CARE_PROVIDER_SITE_OTHER): Payer: Commercial Managed Care - HMO | Admitting: *Deleted

## 2014-01-11 DIAGNOSIS — I441 Atrioventricular block, second degree: Secondary | ICD-10-CM | POA: Diagnosis not present

## 2014-01-11 NOTE — Progress Notes (Signed)
Remote pacemaker transmission.   

## 2014-01-15 LAB — MDC_IDC_ENUM_SESS_TYPE_REMOTE
Battery Remaining Longevity: 140 mo
Brady Statistic AS VS Percent: 67 %
Date Time Interrogation Session: 20160107123246
Lead Channel Pacing Threshold Amplitude: 0.5 V
Lead Channel Pacing Threshold Amplitude: 1.125 V
Lead Channel Pacing Threshold Pulse Width: 0.4 ms
Lead Channel Sensing Intrinsic Amplitude: 2.8 mV
Lead Channel Setting Pacing Amplitude: 2.5 V
Lead Channel Setting Sensing Sensitivity: 2.8 mV
MDC IDC MSMT BATTERY IMPEDANCE: 158 Ohm
MDC IDC MSMT BATTERY VOLTAGE: 2.79 V
MDC IDC MSMT LEADCHNL RA IMPEDANCE VALUE: 453 Ohm
MDC IDC MSMT LEADCHNL RV IMPEDANCE VALUE: 589 Ohm
MDC IDC MSMT LEADCHNL RV PACING THRESHOLD PULSEWIDTH: 0.4 ms
MDC IDC MSMT LEADCHNL RV SENSING INTR AMPL: 5.6 mV
MDC IDC SET LEADCHNL RA PACING AMPLITUDE: 2 V
MDC IDC SET LEADCHNL RV PACING PULSEWIDTH: 0.4 ms
MDC IDC STAT BRADY AP VP PERCENT: 0 %
MDC IDC STAT BRADY AP VS PERCENT: 32 %
MDC IDC STAT BRADY AS VP PERCENT: 2 %

## 2014-01-24 ENCOUNTER — Encounter: Payer: Self-pay | Admitting: Cardiology

## 2014-02-02 ENCOUNTER — Encounter: Payer: Self-pay | Admitting: Internal Medicine

## 2014-02-18 ENCOUNTER — Other Ambulatory Visit: Payer: Self-pay | Admitting: Family Medicine

## 2014-02-20 ENCOUNTER — Ambulatory Visit (INDEPENDENT_AMBULATORY_CARE_PROVIDER_SITE_OTHER): Payer: Commercial Managed Care - HMO | Admitting: Cardiovascular Disease

## 2014-02-20 ENCOUNTER — Encounter: Payer: Self-pay | Admitting: Cardiovascular Disease

## 2014-02-20 VITALS — BP 142/88 | HR 69 | Ht 59.0 in | Wt 199.0 lb

## 2014-02-20 DIAGNOSIS — I5032 Chronic diastolic (congestive) heart failure: Secondary | ICD-10-CM

## 2014-02-20 DIAGNOSIS — I441 Atrioventricular block, second degree: Secondary | ICD-10-CM | POA: Diagnosis not present

## 2014-02-20 DIAGNOSIS — I10 Essential (primary) hypertension: Secondary | ICD-10-CM

## 2014-02-20 DIAGNOSIS — R011 Cardiac murmur, unspecified: Secondary | ICD-10-CM

## 2014-02-20 DIAGNOSIS — I2510 Atherosclerotic heart disease of native coronary artery without angina pectoris: Secondary | ICD-10-CM

## 2014-02-20 NOTE — Patient Instructions (Signed)

## 2014-02-20 NOTE — Progress Notes (Signed)
History of Present Illness: 74 yo WF with h/o CAD with DES RCA in June 2011, HTN, hyperlipidemia, hypothyroidism, AV block s/p PPM, CVA and basilar artery stenosis here today for follow up. Left heart cath on 06/13/09 and she was found to have a severe stenosis in the RCA. There was minor disease in the LAD and Circumflex. I placed a single DES in the RCA. She was admitted to [facility] 08/10/11 with dizziness, chest pain and repeat cath showed stable disease. She was observed to have second degree AV block demonstrated with RBBB. Notes during that hospital visit support multiple consecutive P waves not conducted. Her coreg was stopped. She was discharged and wore an event monitor. This did not document arrhythmias or advanced AV block. There were no episodes of AV block documented. GXT was performed in our office 09/01/11. She was observed during the study to have a LBBB with exertion. She developed exertional symptoms of fatigue, SOB, and CP which corresponded to her LBBB. She was seen by Dr. Johney Frame and continued to have episodes of palpitations and dizziness with moderate activity. A permanent pacemaker was placed on 11/11/11 by Dr. Johney Frame. She has been undergoing cerebral angiograms for basilar artery stenosis and has had angiplasty of the basilar artery in August 2010. She is known to have an occluded left common carotid artery. She was admitted to [facility] December 2012 with a CVA. She is being followed by Neurology. Admitted to [facility] February 2015 with chest pain. Stress myoview 2/15 with no ischemia, LVEF-79%. Admitted to [facility] July 2015 with chest pain. Cardiac cath 07/27/13 with stable CAD. Cardiopulmonary stress test 09/12/13 with maximum HR 115 bpm with 7 minutes exercise.  She is here today for follow up. She is feeling well overall. No chest pain or burning. Breathing is at baseline. Weight is stable.   Primary Care Physician: Dr. Para March   Last Lipid Profile:Lipid Panel     Component Value  Date/Time   CHOL 132 02/28/2013 0426   TRIG 162* 02/28/2013 0426   HDL 38* 02/28/2013 0426   CHOLHDL 3.5 02/28/2013 0426   VLDL 32 02/28/2013 0426   LDLCALC 62 02/28/2013 0426   Past Medical History  Diagnosis Date  . Hypothyroidism 1990  . Labile hypertension 08/1991  . HLD (hyperlipidemia) 01/1997  . Fibromyalgia   . Bladder spasms   . Basilar artery stenosis     90%  . CAD (coronary artery disease)     a. 06/2009 cath: severe stenosis RCA with placement drug eluting stent;  b. 08/2011 cath: normal left main, luminal LAD irregularities, luminal LCx irregularities, luminal RCA irregularities and no ISR to mid-RCA DES, LVEF 60-65%; c. 02/2013 nonischemic CL, EF 79%.  . IC (interstitial cystitis)   . Obesity   . Hypertension   . Blood transfusion 1960's    w/childbirth  . CVA (cerebral infarction) December 2012  . Pacemaker 11/11/2011    dual chamber pacemaker  . Second degree Mobitz II AV block   . Family history of anesthesia complication     "first cousin had very fever and almost died" (03/13/13)  . Malignant hyperthermia     "told to warn everybody before I had surgery that my 1st cousin had malignant hyperthermia; I've had symptoms of it too" (2013-03-13)  . Pneumonia     "twice" (03/13/13)  . Stroke     "they tell me I've had some mini strokes; nothing that's bothered me though" (03/13/2013)  . Arthritis     "  fingers" (02/27/2013)    Past Surgical History  Procedure Laterality Date  . Bladder suspension  1974  . Cholecystectomy  1985  . Cardiac catheterization  1993    normal  . Renal angioplasty  1993    normal  . Carotid US  11/93    Occl L comm carotid  . Visual evoked response  11/93; 1/01    normal  . US renal/aorta  12/93    Normal  . Emg  1/01    LE, RUE normal  . Carotid US  4/98    L comm occl R-ok  . Carotid US  01/30/03    L occl  R less 40%  . Dexa  10/15/04    Troch -0.4 o/w pos  . Carotid arteriogram      bilat  . Bilateral vert &  subclavian angiogram  07/19/08    85-90% stenosis mid basilar art; occluded left vert artery; occluded left comm carotid artery w/ collat flow  . Basilar artery angioplasty  09/04/08    sm right brachial hematoma  . Cath single vess dz  06/13/09    70% stenosis RCA EF 65-70%  . Coronary angioplasty with stent placement  06/19/09  . Cataract extraction w/ intraocular lens implant Bilateral 2007-2008  . Eye surgery    . Vaginal hysterectomy  1974    dysmennorhea  . Insert / replace / remove pacemaker  11/11/2011    MDT Adapta L implanted by Dr Johney Frame. [employer]  . Left heart catheterization with coronary angiogram N/A 08/10/2011    Procedure: LEFT HEART CATHETERIZATION WITH CORONARY ANGIOGRAM;  Surgeon: Kathleene Hazel, MD;  Location: [facility] CATH LAB;  Service: Cardiovascular;  Laterality: N/A;  . Permanent pacemaker insertion N/A 11/11/2011    Procedure: PERMANENT PACEMAKER INSERTION;  Surgeon: Hillis Range, MD;  Location: [facility] CATH LAB;  Service: Cardiovascular;  Laterality: N/A;  . Left heart catheterization with coronary angiogram N/A 07/28/2013    Procedure: LEFT HEART CATHETERIZATION WITH CORONARY ANGIOGRAM;  Surgeon: Kathleene Hazel, MD;  Location: [facility] CATH LAB;  Service: Cardiovascular;  Laterality: N/A;    Current Outpatient Prescriptions  Medication Sig Dispense Refill  . amLODipine (NORVASC) 5 MG tablet TAKE 1 TABLET EVERY DAY 90 tablet 1  . aspirin EC 81 MG tablet Take 81 mg by mouth every morning.    . clopidogrel (PLAVIX) 75 MG tablet TAKE 1 TABLET BY MOUTH EVERY DAY 90 tablet 1  . Coenzyme Q10 200 MG capsule Take 200 mg by mouth 2 (two) times daily.    . furosemide (LASIX) 40 MG tablet Take 40 mg by mouth daily as needed for edema.    . lansoprazole (PREVACID) 15 MG capsule Take 15 mg by mouth as needed (reflux).     Marland Kitchen levothyroxine (SYNTHROID, LEVOTHROID) 100 MCG tablet TAKE 1 TABLET DAILY AS DIRECTED 90 tablet 1  . losartan-hydrochlorothiazide (HYZAAR) 100-25 MG per tablet  TAKE 1 TABLET BY MOUTH EVERY DAY 90 tablet 1  . nitroGLYCERIN (NITROSTAT) 0.4 MG SL tablet Place 0.4 mg under the tongue every 5 (five) minutes as needed (MAX 3 TABLETS). For chest pain    . Omega-3 Fatty Acids (FISH OIL BURP-LESS) 500 MG CAPS Take 1,000 mg by mouth 2 (two) times daily.     . Phenazopyridine HCl (AZO TABS PO) Take 1 tablet by mouth daily as needed (bladder spasms).     . simvastatin (ZOCOR) 10 MG tablet TAKE 1 TABLET BY MOUTH EVERY DAY AT 6PM 90 tablet 1  No current facility-administered medications for this visit.    Allergies  Allergen Reactions  . Anesthetics, Amide Other (See Comments)    REACTION:FEVER, SYNCOPE  . Codeine Nausea And Vomiting    Reaction:Hallucinations  . Nitrofurantoin Nausea Only and Other (See Comments)    REACTION: Hallucinations  . Orphenadrine Citrate Nausea And Vomiting and Other (See Comments)    REACTION: Hallucination  . Pentosan Polysulfate Sodium Rash and Other (See Comments)    REACTION: rash/ severe headache    History   Social History  . Marital Status: Married    Spouse Name: N/A  . Number of Children: 1  . Years of Education: N/A   Occupational History  . Bookkeeper-part time   . retired    Social History Main Topics  . Smoking status: Never Smoker   . Smokeless tobacco: Never Used  . Alcohol Use: No  . Drug Use: No  . Sexual Activity: Not Currently    Birth Control/ Protection: Post-menopausal   Other Topics Concern  . 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 Lung cancer Father   . Heart attack Mother   . Lymphoma Mother     s/p chemo  . Stroke Mother     x 2  . Diabetes Brother   . Colon cancer Neg Hx   . Breast cancer Maternal Aunt     Review of Systems:  As stated in the HPI and otherwise negative.   BP 142/88 mmHg  Pulse 69  Ht 4'  11" (1.499 m)  Wt 199 lb (90.266 kg)  BMI 40.17 kg/m2  Physical Examination: General: Well developed, well nourished, NAD HEENT: OP clear, mucus membranes moist SKIN: warm, dry. No rashes. Neuro: No focal deficits Musculoskeletal: Muscle strength 5/5 all ext Psychiatric: Mood and affect normal Neck: No JVD, no carotid bruits, no thyromegaly, no lymphadenopathy. Lungs:Clear bilaterally, no wheezes, rhonci, crackles Cardiovascular: Regular rate and rhythm. No murmurs, gallops or rubs. Abdomen:Soft. Bowel sounds present. Non-tender.  Extremities: No lower extremity edema. Pulses are 2 + in the bilateral DP/PT.  Cardiac cath 07/27/13: Left main: No obstructive disease.  Left Anterior Descending Artery: Large caliber vessel that courses to the apex. The mid vessel has diffuse 20% stenosis. There is a very small caliber diagonal branch with proximal 40% stenosis. There are no flow limiting lesions noted.  Circumflex Artery: Moderate caliber vessel with moderate caliber obtuse marginal branch. No obstructive disease.  Right Coronary Artery: Large caliber vessel with 20% mid stenosis, patent distal stent without restenosis.  Left Ventricular Angiogram: LVEF=65-70%.  Impression:  1. Single vessel CAD with patent stent RCA  2. Normal LV function  3. Non-cardiac chest pain   Assessment and Plan:   1. CAD: Stable. Cath July 2015 with stable CAD. No changes today. Continue Plavix and statin.   2. High grade symptomatic AV block: s/p PPM 11/10/11 per Dr. Johney FrameAllred. Followed in EP clinic.    3. HTN: BP has been well controlled at home. No changes today.   4. Cardiac murmur: Likely Aortic stenosis. Last echo 2012 with aortic valve sclerosis with no stenosis. Repeat echo now.   5. Chronic diastolic CHF: Lasix prn. Weight is stable.

## 2014-02-21 ENCOUNTER — Ambulatory Visit (HOSPITAL_COMMUNITY): Payer: Commercial Managed Care - HMO | Attending: Cardiovascular Disease | Admitting: Radiology

## 2014-02-21 DIAGNOSIS — R011 Cardiac murmur, unspecified: Secondary | ICD-10-CM | POA: Diagnosis not present

## 2014-02-21 NOTE — Progress Notes (Signed)
Echocardiogram performed.  

## 2014-02-27 ENCOUNTER — Other Ambulatory Visit (HOSPITAL_COMMUNITY): Payer: Self-pay | Admitting: Interventional Radiology

## 2014-02-27 DIAGNOSIS — I771 Stricture of artery: Secondary | ICD-10-CM

## 2014-03-01 ENCOUNTER — Ambulatory Visit (HOSPITAL_COMMUNITY)
Admission: RE | Admit: 2014-03-01 | Discharge: 2014-03-01 | Disposition: A | Discharge status: Home / Self Care | Payer: Commercial Managed Care - HMO | Source: Ambulatory Visit | Attending: Interventional Radiology | Admitting: Interventional Radiology

## 2014-03-01 ENCOUNTER — Encounter (HOSPITAL_COMMUNITY): Payer: Self-pay

## 2014-03-01 DIAGNOSIS — I771 Stricture of artery: Secondary | ICD-10-CM | POA: Diagnosis not present

## 2014-03-01 DIAGNOSIS — I6522 Occlusion and stenosis of left carotid artery: Secondary | ICD-10-CM | POA: Diagnosis not present

## 2014-03-01 DIAGNOSIS — I651 Occlusion and stenosis of basilar artery: Secondary | ICD-10-CM | POA: Diagnosis not present

## 2014-03-01 LAB — POCT I-STAT CREATININE: Creatinine, Ser: 1.1 mg/dL (ref 0.50–1.10)

## 2014-03-01 MED ORDER — IOHEXOL 350 MG/ML SOLN
100.0000 mL | Freq: Once | INTRAVENOUS | Status: AC | PRN
  Administered 2014-03-01: 100 mL via INTRAVENOUS

## 2014-03-14 ENCOUNTER — Ambulatory Visit: Payer: Commercial Managed Care - HMO | Admitting: Cardiovascular Disease

## 2014-03-26 ENCOUNTER — Emergency Department (HOSPITAL_COMMUNITY): Payer: Commercial Managed Care - HMO

## 2014-03-26 ENCOUNTER — Observation Stay (HOSPITAL_COMMUNITY)
Admission: EM | Admit: 2014-03-26 | Discharge: 2014-03-28 | Disposition: A | Discharge status: Home / Self Care | Payer: Commercial Managed Care - HMO | Attending: Internal Medicine | Admitting: Internal Medicine

## 2014-03-26 ENCOUNTER — Encounter (HOSPITAL_COMMUNITY): Payer: Self-pay

## 2014-03-26 DIAGNOSIS — R410 Disorientation, unspecified: Secondary | ICD-10-CM | POA: Insufficient documentation

## 2014-03-26 DIAGNOSIS — G454 Transient global amnesia: Principal | ICD-10-CM

## 2014-03-26 DIAGNOSIS — Z7902 Long term (current) use of antithrombotics/antiplatelets: Secondary | ICD-10-CM | POA: Diagnosis not present

## 2014-03-26 DIAGNOSIS — I6789 Other cerebrovascular disease: Secondary | ICD-10-CM | POA: Diagnosis not present

## 2014-03-26 DIAGNOSIS — I651 Occlusion and stenosis of basilar artery: Secondary | ICD-10-CM | POA: Diagnosis not present

## 2014-03-26 DIAGNOSIS — E039 Hypothyroidism, unspecified: Secondary | ICD-10-CM

## 2014-03-26 DIAGNOSIS — Z7982 Long term (current) use of aspirin: Secondary | ICD-10-CM | POA: Insufficient documentation

## 2014-03-26 DIAGNOSIS — I441 Atrioventricular block, second degree: Secondary | ICD-10-CM | POA: Diagnosis present

## 2014-03-26 DIAGNOSIS — Z87440 Personal history of urinary (tract) infections: Secondary | ICD-10-CM | POA: Diagnosis not present

## 2014-03-26 DIAGNOSIS — Z884 Allergy status to anesthetic agent status: Secondary | ICD-10-CM | POA: Insufficient documentation

## 2014-03-26 DIAGNOSIS — F4489 Other dissociative and conversion disorders: Secondary | ICD-10-CM | POA: Diagnosis not present

## 2014-03-26 DIAGNOSIS — I2510 Atherosclerotic heart disease of native coronary artery without angina pectoris: Secondary | ICD-10-CM | POA: Diagnosis not present

## 2014-03-26 DIAGNOSIS — Z885 Allergy status to narcotic agent status: Secondary | ICD-10-CM | POA: Diagnosis not present

## 2014-03-26 DIAGNOSIS — E669 Obesity, unspecified: Secondary | ICD-10-CM | POA: Insufficient documentation

## 2014-03-26 DIAGNOSIS — I4510 Unspecified right bundle-branch block: Secondary | ICD-10-CM | POA: Diagnosis not present

## 2014-03-26 DIAGNOSIS — R413 Other amnesia: Secondary | ICD-10-CM | POA: Insufficient documentation

## 2014-03-26 DIAGNOSIS — E785 Hyperlipidemia, unspecified: Secondary | ICD-10-CM | POA: Diagnosis not present

## 2014-03-26 DIAGNOSIS — Z950 Presence of cardiac pacemaker: Secondary | ICD-10-CM | POA: Insufficient documentation

## 2014-03-26 DIAGNOSIS — I10 Essential (primary) hypertension: Secondary | ICD-10-CM | POA: Diagnosis not present

## 2014-03-26 DIAGNOSIS — Z79899 Other long term (current) drug therapy: Secondary | ICD-10-CM | POA: Diagnosis not present

## 2014-03-26 DIAGNOSIS — Z8673 Personal history of transient ischemic attack (TIA), and cerebral infarction without residual deficits: Secondary | ICD-10-CM | POA: Diagnosis not present

## 2014-03-26 DIAGNOSIS — M797 Fibromyalgia: Secondary | ICD-10-CM | POA: Diagnosis not present

## 2014-03-26 DIAGNOSIS — Z888 Allergy status to other drugs, medicaments and biological substances status: Secondary | ICD-10-CM | POA: Insufficient documentation

## 2014-03-26 LAB — URINALYSIS, ROUTINE W REFLEX MICROSCOPIC
BILIRUBIN URINE: NEGATIVE
Glucose, UA: NEGATIVE mg/dL
Hgb urine dipstick: NEGATIVE
Ketones, ur: NEGATIVE mg/dL
LEUKOCYTES UA: NEGATIVE
Nitrite: NEGATIVE
PH: 6.5 (ref 5.0–8.0)
Protein, ur: NEGATIVE mg/dL
Specific Gravity, Urine: 1.006 (ref 1.005–1.030)
Urobilinogen, UA: 0.2 mg/dL (ref 0.0–1.0)

## 2014-03-26 LAB — RAPID URINE DRUG SCREEN, HOSP PERFORMED
Amphetamines: NOT DETECTED
BARBITURATES: NOT DETECTED
Benzodiazepines: NOT DETECTED
Cocaine: NOT DETECTED
Opiates: NOT DETECTED
Tetrahydrocannabinol: NOT DETECTED

## 2014-03-26 LAB — CBC
HCT: 43.9 % (ref 36.0–46.0)
HEMOGLOBIN: 15.1 g/dL — AB (ref 12.0–15.0)
MCH: 30 pg (ref 26.0–34.0)
MCHC: 34.4 g/dL (ref 30.0–36.0)
MCV: 87.3 fL (ref 78.0–100.0)
PLATELETS: 217 10*3/uL (ref 150–400)
RBC: 5.03 MIL/uL (ref 3.87–5.11)
RDW: 13.1 % (ref 11.5–15.5)
WBC: 9.7 10*3/uL (ref 4.0–10.5)

## 2014-03-26 LAB — I-STAT TROPONIN, ED: TROPONIN I, POC: 0.01 ng/mL (ref 0.00–0.08)

## 2014-03-26 LAB — BASIC METABOLIC PANEL
ANION GAP: 10 (ref 5–15)
BUN: 18 mg/dL (ref 6–23)
CO2: 26 mmol/L (ref 19–32)
Calcium: 9.5 mg/dL (ref 8.4–10.5)
Chloride: 105 mmol/L (ref 96–112)
Creatinine, Ser: 0.95 mg/dL (ref 0.50–1.10)
GFR calc Af Amer: 68 mL/min — ABNORMAL LOW (ref >90)
GFR, EST NON AFRICAN AMERICAN: 59 mL/min — AB (ref >90)
GLUCOSE: 115 mg/dL — AB (ref 70–99)
Potassium: 3.6 mmol/L (ref 3.5–5.1)
SODIUM: 141 mmol/L (ref 135–145)

## 2014-03-26 MED ORDER — LOSARTAN POTASSIUM-HCTZ 100-25 MG PO TABS: 1.0000 | ORAL_TABLET | Freq: Every day | ORAL | Status: DC

## 2014-03-26 MED ORDER — ACETAMINOPHEN 650 MG RE SUPP: 650.0000 mg | Freq: Four times a day (QID) | RECTAL | Status: DC | PRN

## 2014-03-26 MED ORDER — OMEGA-3-ACID ETHYL ESTERS 1 G PO CAPS
1.0000 g | ORAL_CAPSULE | Freq: Two times a day (BID) | ORAL | Status: DC
  Administered 2014-03-27 – 2014-03-28 (×3): 1 g via ORAL
  Filled 2014-03-26 (×4): qty 1

## 2014-03-26 MED ORDER — ASPIRIN EC 81 MG PO TBEC
81.0000 mg | DELAYED_RELEASE_TABLET | Freq: Every morning | ORAL | Status: DC
  Administered 2014-03-27 – 2014-03-28 (×2): 81 mg via ORAL
  Filled 2014-03-26 (×2): qty 1

## 2014-03-26 MED ORDER — LOSARTAN POTASSIUM 50 MG PO TABS
100.0000 mg | ORAL_TABLET | Freq: Every day | ORAL | Status: DC
  Administered 2014-03-27 – 2014-03-28 (×2): 100 mg via ORAL
  Filled 2014-03-26 (×2): qty 2

## 2014-03-26 MED ORDER — ONDANSETRON HCL 4 MG/2ML IJ SOLN: 4.0000 mg | Freq: Four times a day (QID) | INTRAMUSCULAR | Status: DC | PRN

## 2014-03-26 MED ORDER — ENOXAPARIN SODIUM 40 MG/0.4ML ~~LOC~~ SOLN
40.0000 mg | Freq: Every day | SUBCUTANEOUS | Status: DC
  Administered 2014-03-27 (×2): 40 mg via SUBCUTANEOUS
  Filled 2014-03-26 (×4): qty 0.4

## 2014-03-26 MED ORDER — SODIUM CHLORIDE 0.9 % IJ SOLN
3.0000 mL | Freq: Two times a day (BID) | INTRAMUSCULAR | Status: DC
  Administered 2014-03-27 (×2): 3 mL via INTRAVENOUS

## 2014-03-26 MED ORDER — SIMVASTATIN 10 MG PO TABS
10.0000 mg | ORAL_TABLET | Freq: Every day | ORAL | Status: DC
  Administered 2014-03-27: 10 mg via ORAL
  Filled 2014-03-26 (×2): qty 1

## 2014-03-26 MED ORDER — CLOPIDOGREL BISULFATE 75 MG PO TABS
75.0000 mg | ORAL_TABLET | Freq: Every day | ORAL | Status: DC
Start: 2014-03-27 — End: 2014-03-28
  Administered 2014-03-27 – 2014-03-28 (×2): 75 mg via ORAL
  Filled 2014-03-26 (×2): qty 1

## 2014-03-26 MED ORDER — FISH OIL BURP-LESS 500 MG PO CAPS: 1000.0000 mg | ORAL_CAPSULE | Freq: Two times a day (BID) | ORAL | Status: DC

## 2014-03-26 MED ORDER — AMLODIPINE BESYLATE 5 MG PO TABS
5.0000 mg | ORAL_TABLET | Freq: Every day | ORAL | Status: DC
  Administered 2014-03-27 – 2014-03-28 (×2): 5 mg via ORAL
  Filled 2014-03-26 (×2): qty 1

## 2014-03-26 MED ORDER — ACETAMINOPHEN 325 MG PO TABS: 650.0000 mg | ORAL_TABLET | Freq: Four times a day (QID) | ORAL | Status: DC | PRN

## 2014-03-26 MED ORDER — COENZYME Q10 200 MG PO CAPS: 200.0000 mg | ORAL_CAPSULE | Freq: Two times a day (BID) | ORAL | Status: DC

## 2014-03-26 MED ORDER — NITROGLYCERIN 0.4 MG SL SUBL: 0.4000 mg | SUBLINGUAL_TABLET | SUBLINGUAL | Status: DC | PRN

## 2014-03-26 MED ORDER — ONDANSETRON HCL 4 MG PO TABS: 4.0000 mg | ORAL_TABLET | Freq: Four times a day (QID) | ORAL | Status: DC | PRN

## 2014-03-26 MED ORDER — PANTOPRAZOLE SODIUM 40 MG PO TBEC
40.0000 mg | DELAYED_RELEASE_TABLET | Freq: Every day | ORAL | Status: DC
  Administered 2014-03-27 – 2014-03-28 (×2): 40 mg via ORAL
  Filled 2014-03-26: qty 1

## 2014-03-26 MED ORDER — LEVOTHYROXINE SODIUM 100 MCG PO TABS
100.0000 ug | ORAL_TABLET | Freq: Every day | ORAL | Status: DC
  Administered 2014-03-27 – 2014-03-28 (×2): 100 ug via ORAL
  Filled 2014-03-26 (×3): qty 1

## 2014-03-26 MED ORDER — HYDROCHLOROTHIAZIDE 25 MG PO TABS
25.0000 mg | ORAL_TABLET | Freq: Every day | ORAL | Status: DC
  Administered 2014-03-27 – 2014-03-28 (×2): 25 mg via ORAL
  Filled 2014-03-26 (×2): qty 1

## 2014-03-26 MED ORDER — FUROSEMIDE 40 MG PO TABS
40.0000 mg | ORAL_TABLET | Freq: Every day | ORAL | Status: DC | PRN
  Filled 2014-03-26: qty 1

## 2014-03-26 NOTE — ED Notes (Signed)
Attempted report nurse states she is unable to take report.

## 2014-03-26 NOTE — ED Notes (Signed)
Gwendolyn GrantWalden MD made aware of pt and is at bedside.

## 2014-03-26 NOTE — Progress Notes (Signed)
Received report from ED.  

## 2014-03-26 NOTE — H&P (Signed)
[facility] History and Physical  Kelsey Dunn ZOX:[id number] DOB: 1941-01-04 DOA: 03/26/2014  Referring physician: ER physician. PCP: Crawford GivensGraham [name], MD   Chief Complaint: Sudden loss of memory.  HPI: Kelsey Dunn is a 74 y.o. female with history of CAD status post stenting, high-grade AV block status post pacemaker placement, hypertension and hyperlipidemia was brought to the ER after patient was found to have sudden onset of loss of memory. Patient at around 11:45 AM was found to be confused and was not able to remember what happened since morning. In the ER patient was found to be nonfocal and was still confused. Patient was afebrile. UA and chest x-ray was unremarkable. Urine drug screen was negative. On-call neurologist Dr. Roseanne RenoStewart was consulted and at this time is been admitted for further observation and management. As per the family patient has not been started on any new medications.   Review of Systems: As presented in the history of presenting illness, rest negative.  Past Medical History  Diagnosis Date  . Hypothyroidism 1990  . Labile hypertension 08/1991  . HLD (hyperlipidemia) 01/1997  . Fibromyalgia   . Bladder spasms   . Basilar artery stenosis     90%  . CAD (coronary artery disease)     a. 06/2009 cath: severe stenosis RCA with placement drug eluting stent;  b. 08/2011 cath: normal left main, luminal LAD irregularities, luminal LCx irregularities, luminal RCA irregularities and no ISR to mid-RCA DES, LVEF 60-65%; c. 02/2013 nonischemic CL, EF 79%.  . IC (interstitial cystitis)   . Obesity   . Hypertension   . Blood transfusion 1960's    w/childbirth  . CVA (cerebral infarction) December 2012  . Pacemaker 11/11/2011    dual chamber pacemaker  . Second degree Mobitz II AV block   . Family history of anesthesia complication     "first cousin had very fever and almost died" (02/27/2013)  . Malignant hyperthermia     "told to warn everybody before I had surgery  that my 1st cousin had malignant hyperthermia; I've had symptoms of it too" (02/27/2013)  . Pneumonia     "twice" (02/27/2013)  . Stroke     "they tell me I've had some mini strokes; nothing that's bothered me though" (02/27/2013)  . Arthritis     "fingers" (02/27/2013)   Past Surgical History  Procedure Laterality Date  . Bladder suspension  1974  . Cholecystectomy  1985  . Cardiac catheterization  1993    normal  . Renal angioplasty  1993    normal  . Carotid us  11/93    Occl L comm carotid  . Visual evoked response  11/93; 1/01    normal  . [place] renal/aorta  12/93    Normal  . Emg  1/01    LE, RUE normal  . Carotid us  4/98    L comm occl R-ok  . Carotid us  01/30/03    L occl  R less 40%  . Dexa  10/15/04    Troch -0.4 o/w pos  . Carotid arteriogram      bilat  . Bilateral vert & subclavian angiogram  07/19/08    85-90% stenosis mid basilar art; occluded left vert artery; occluded left comm carotid artery w/ collat flow  . Basilar artery angioplasty  09/04/08    sm right brachial hematoma  . Cath single vess dz  06/13/09    70% stenosis RCA EF 65-70%  . Coronary angioplasty with stent placement  06/19/09  . Cataract extraction w/ intraocular lens implant Bilateral 2007-2008  . Eye surgery    . Vaginal hysterectomy  1974    dysmennorhea  . Insert / replace / remove pacemaker  11/11/2011    MDT Adapta L implanted by Dr Johney Frame. [employer]  . Left heart catheterization with coronary angiogram N/A 08/10/2011    Procedure: LEFT HEART CATHETERIZATION WITH CORONARY ANGIOGRAM;  Surgeon: Kathleene Hazel, MD;  Location: [facility] CATH LAB;  Service: Cardiovascular;  Laterality: N/A;  . Permanent pacemaker insertion N/A 11/11/2011    Procedure: PERMANENT PACEMAKER INSERTION;  Surgeon: Hillis Range, MD;  Location: [facility] CATH LAB;  Service: Cardiovascular;  Laterality: N/A;  . Left heart catheterization with coronary angiogram N/A 07/28/2013    Procedure: LEFT HEART CATHETERIZATION WITH CORONARY  ANGIOGRAM;  Surgeon: Kathleene Hazel, MD;  Location: [facility] CATH LAB;  Service: Cardiovascular;  Laterality: N/A;   Social History:  reports that she has never smoked. She has never used smokeless tobacco. She reports that she does not drink alcohol or use illicit drugs. Where does patient live at home. Can patient participate in ADLs? Marland Kitchen  Allergies  Allergen Reactions  . Anesthetics, Amide Other (See Comments)    REACTION:FEVER, SYNCOPE  . Codeine Nausea And Vomiting    Reaction:Hallucinations  . Nitrofurantoin Nausea Only and Other (See Comments)    REACTION: Hallucinations  . Orphenadrine Citrate Nausea And Vomiting and Other (See Comments)    REACTION: Hallucination  . Pentosan Polysulfate Sodium Rash and Other (See Comments)    REACTION: rash/ severe headache    Family History:  Family History  Problem Relation Age of Onset  . Lung cancer Father   . Heart attack Mother   . Lymphoma Mother     s/p chemo  . Stroke Mother     x 2  . Diabetes Brother   . Colon cancer Neg Hx   . Breast cancer Maternal Aunt       Prior to Admission medications   Medication Sig Start Date End Date Taking? Authorizing Provider  amLODipine (NORVASC) 5 MG tablet TAKE 1 TABLET EVERY DAY 12/25/13  Yes Joaquim Nam, MD  aspirin EC 81 MG tablet Take 81 mg by mouth every morning.   Yes Historical Provider, MD  clopidogrel (PLAVIX) 75 MG tablet TAKE 1 TABLET BY MOUTH EVERY DAY 02/19/14  Yes Joaquim Nam, MD  Coenzyme Q10 200 MG capsule Take 200 mg by mouth 2 (two) times daily.   Yes Historical Provider, MD  lansoprazole (PREVACID) 15 MG capsule Take 15 mg by mouth as needed (reflux).    Yes Historical Provider, MD  levothyroxine (SYNTHROID, LEVOTHROID) 100 MCG tablet TAKE 1 TABLET DAILY AS DIRECTED 11/09/13  Yes Joaquim Nam, MD  losartan-hydrochlorothiazide [facility]) 100-25 MG per tablet TAKE 1 TABLET BY MOUTH EVERY DAY 02/19/14  Yes Joaquim Nam, MD  Omega-3 Fatty Acids (FISH OIL  BURP-LESS) 500 MG CAPS Take 1,000 mg by mouth 2 (two) times daily.    Yes Historical Provider, MD  Phenazopyridine HCl (AZO TABS PO) Take 1 tablet by mouth daily as needed (bladder spasms).    Yes Historical Provider, MD  simvastatin (ZOCOR) 10 MG tablet TAKE 1 TABLET BY MOUTH EVERY DAY AT 6PM 02/19/14  Yes Joaquim Nam, MD  furosemide (LASIX) 40 MG tablet Take 40 mg by mouth daily as needed for edema.    Historical Provider, MD  nitroGLYCERIN (NITROSTAT) 0.4 MG SL tablet Place 0.4 mg under the tongue  every 5 (five) minutes as needed (MAX 3 TABLETS). For chest pain    Historical Provider, MD    Physical Exam: Filed Vitals:   03/26/14 1900 03/26/14 1945 03/26/14 2000 03/26/14 2015  BP: 134/61 128/46 153/60 154/61  Pulse: 75 70 69 67  Temp:      TempSrc:      Resp:      Height:      Weight:      SpO2: 94% 95% 94% 95%     General:  Well-developed and nourished.  Eyes: Anicteric no pallor.  ENT: No discharge from the ears eyes nose or mouth.  Neck: No mass felt.  Cardiovascular: S1 and S2 heard.  Respiratory: No rhonchi or crepitations.  Abdomen: Soft nontender bowel sounds present.  Skin: No rash.  Musculoskeletal: No edema.  Psychiatric: Patient appears confused.  Neurologic: Alert awake oriented to her name place and person. Moves all extremities 5 x 5. No facial asymmetry. Tongue is midline. PERRLA positive.  Labs on Admission:  Basic Metabolic Panel:  Recent Labs Lab 03/26/14 1631  NA 141  K 3.6  CL 105  CO2 26  GLUCOSE 115*  BUN 18  CREATININE 0.95  CALCIUM 9.5   Liver Function Tests: No results for input(s): AST, ALT, ALKPHOS, BILITOT, PROT, ALBUMIN in the last 168 hours. No results for input(s): LIPASE, AMYLASE in the last 168 hours. No results for input(s): AMMONIA in the last 168 hours. CBC:  Recent Labs Lab 03/26/14 1631  WBC 9.7  HGB 15.1*  HCT 43.9  MCV 87.3  PLT 217   Cardiac Enzymes: No results for input(s): CKTOTAL, CKMB,  CKMBINDEX, TROPONINI in the last 168 hours.  BNP (last 3 results) No results for input(s): BNP in the last 8760 hours.  ProBNP (last 3 results) No results for input(s): PROBNP in the last 8760 hours.  CBG: No results for input(s): GLUCAP in the last 168 hours.  Radiological Exams on Admission: Dg Chest 2 View  03/26/2014   CLINICAL DATA:  Confusion since 11:30 a.m.  EXAM: CHEST  2 VIEW  COMPARISON:  02/27/2013  FINDINGS: Dual-chamber ICD/pacer from the left with stable lead orientation. No cardiomegaly. No aortic or hilar contour abnormality. Minimal scarring or atelectasis at the left base. There is no edema, consolidation, effusion, or pneumothorax.  IMPRESSION: No active cardiopulmonary disease.   Electronically Signed   By: Marnee Spring M.D.   On: 03/26/2014 16:26   Ct Head Wo Contrast  03/26/2014   CLINICAL DATA:  Confusion.  Frequent urinary tract infections.  EXAM: CT HEAD WITHOUT CONTRAST  TECHNIQUE: Contiguous axial images were obtained from the base of the skull through the vertex without intravenous contrast.  COMPARISON:  03/01/2014  FINDINGS: Sinuses/Soft tissues: Hypoplastic frontal sinuses. Mild hyperostosis frontalis interna. Clear mastoid air cells.  Intracranial: Dense bilateral vertebral and left greater than right carotid atherosclerosis.  No mass lesion, hemorrhage, hydrocephalus, acute infarct, intra-axial, or extra-axial fluid collection.  IMPRESSION: 1.  No acute intracranial abnormality. 2. Intracranial atherosclerosis.   Electronically Signed   By: Jeronimo Greaves M.D.   On: 03/26/2014 15:50    EKG: Independently reviewed. Normal sinus rhythm.  Assessment/Plan Principal Problem:   Transient global amnesia Active Problems:   Hypothyroidism   Essential hypertension   Occlusion and stenosis of basilar artery   Heart block AV second degree   1. Transient global amnesia - appreciate neurology consult. At this time we will check ammonia levels RPR TSH B 12 and  folate  levels. Patient did have a 2-D echo done last month which showed EF of 60-65%. Check carotid Dopplers as recommended by neurologist. 2. Hypothyroidism - check TSH and continue Synthroid. 3. High degree AV block status post pacemaker placement. 4. CAD status post stenting - denies any chest pain. 5. Hypertension - continue present medications. 6. Hyperlipidemia on statins.   DVT Prophylaxis SCDs for now.  Code Status: Full code.  Family Communication: Patient's family at the bedside.  Disposition Plan: Admit for observation.    Laronn Devonshire N. [facility] Pager [phone].  If 7PM-7AM, please contact night-coverage [email or web address] Password [facility] 03/26/2014, 9:09 PM

## 2014-03-26 NOTE — ED Provider Notes (Signed)
Results for orders placed or performed during the hospital encounter of 03/26/14  CBC  Result Value Ref Range   WBC 9.7 4.0 - 10.5 K/uL   RBC 5.03 3.87 - 5.11 MIL/uL   Hemoglobin 15.1 (H) 12.0 - 15.0 g/dL   HCT 54.0 98.1 - 19.1 %   MCV 87.3 78.0 - 100.0 fL   MCH 30.0 26.0 - 34.0 pg   MCHC 34.4 30.0 - 36.0 g/dL   RDW 47.8 29.5 - 62.1 %   Platelets 217 150 - 400 K/uL  Basic metabolic panel  Result Value Ref Range   Sodium 141 135 - 145 mmol/L   Potassium 3.6 3.5 - 5.1 mmol/L   Chloride 105 96 - 112 mmol/L   CO2 26 19 - 32 mmol/L   Glucose, Bld 115 (H) 70 - 99 mg/dL   BUN 18 6 - 23 mg/dL   Creatinine, Ser 3.08 0.50 - 1.10 mg/dL   Calcium 9.5 8.4 - 65.7 mg/dL   GFR calc non Af Amer 59 (L) >90 mL/min   GFR calc Af Amer 68 (L) >90 mL/min   Anion gap 10 5 - 15  Urinalysis, Routine w reflex microscopic  Result Value Ref Range   Color, Urine YELLOW YELLOW   APPearance CLEAR CLEAR   Specific Gravity, Urine 1.006 1.005 - 1.030   pH 6.5 5.0 - 8.0   Glucose, UA NEGATIVE NEGATIVE mg/dL   Hgb urine dipstick NEGATIVE NEGATIVE   Bilirubin Urine NEGATIVE NEGATIVE   Ketones, ur NEGATIVE NEGATIVE mg/dL   Protein, ur NEGATIVE NEGATIVE mg/dL   Urobilinogen, UA 0.2 0.0 - 1.0 mg/dL   Nitrite NEGATIVE NEGATIVE   Leukocytes, UA NEGATIVE NEGATIVE  I-stat troponin, ED  Result Value Ref Range   Troponin i, poc 0.01 0.00 - 0.08 ng/mL   Comment 3           Ct Angio Head W/cm &/or Wo Cm  03/01/2014   CLINICAL DATA:  Basilar artery stenosis follow-up  EXAM: CT ANGIOGRAPHY HEAD AND NECK  TECHNIQUE: Multidetector CT imaging of the head and neck was performed using the standard protocol during bolus administration of intravenous contrast. Multiplanar CT image reconstructions and MIPs were obtained to evaluate the vascular anatomy. Carotid stenosis measurements (when applicable) are obtained utilizing NASCET criteria, using the distal internal carotid diameter as the denominator.  CONTRAST:  OMNIPAQUE  IOHEXOL 350 MG/ML SOLN  COMPARISON:  CTA 01/16/2013  FINDINGS: CT HEAD  Brain: Ventricle size is normal. Negative for acute infarct. Negative for hemorrhage or mass.  Calvarium and skull base: Negative  Paranasal sinuses: Negative  Orbits: Negative for mass. Enlarged left ophthalmic artery likely due to collateral flow from left common carotid artery occlusion.  CTA NECK  Aortic arch: Negative  Right carotid system: Right common carotid artery widely patent without stenosis. Right carotid bifurcation widely patent. Mild plaque in the carotid bulb without significant stenosis or dissection.  Left carotid system: Left common carotid artery remains occluded at the origin, unchanged. There is reconstitution of the internal carotid artery at the bifurcation due to external carotid artery collaterals. There is less flow in the left internal carotid artery on today's study compared to the prior study. This is likely due to progression of disease in the carotid bulb however could be due to timing of the scan with slow flow present due to collaterals. Left internal carotid artery is a very small artery due to low flow. The does appear smaller than on the prior CT.  Vertebral arteries:Left vertebral artery origin from the aortic arch which is a normal variant. Both vertebral arteries are patent to the basilar without stenosis.  Skeleton: Negative  Other neck: Negative for mass or adenopathy  CTA HEAD  Anterior circulation: Atherosclerotic calcification in the cavernous carotid bilaterally. Mild stenosis on the right. Small caliber left internal carotid artery due to low flow from common carotid artery occlusion as well as atherosclerotic disease in the cavernous segment. Anterior and middle cerebral arteries are patent bilaterally without stenosis.  Posterior circulation: Both vertebral arteries are patent to the basilar. Mild calcification distal left vertebral artery. PICA patent bilaterally. AICA patent bilaterally. Just  above the AICA is a mild stenosis in the basilar which is unchanged. Superior cerebellar and posterior cerebral arteries patent bilaterally.  Negative for cerebral aneurysm.  Venous sinuses: Patent  Anatomic variants: As above  Delayed phase: No enhancing lesions.  IMPRESSION: Left common carotid artery remains occluded unchanged. There is decreased caliber and flow in the left internal carotid artery which is likely due to progressive disease in the carotid bulb. Left internal carotid artery is supplied via external carotid collaterals.  Right carotid bifurcation widely patent  Both vertebral arteries widely patent.  Mild stenosis mid basilar unchanged.   Electronically Signed   By: Marlan Palau M.D.   On: 03/01/2014 15:37   Dg Chest 2 View  03/26/2014   CLINICAL DATA:  Confusion since 11:30 a.m.  EXAM: CHEST  2 VIEW  COMPARISON:  02/27/2013  FINDINGS: Dual-chamber ICD/pacer from the left with stable lead orientation. No cardiomegaly. No aortic or hilar contour abnormality. Minimal scarring or atelectasis at the left base. There is no edema, consolidation, effusion, or pneumothorax.  IMPRESSION: No active cardiopulmonary disease.   Electronically Signed   By: Marnee Spring M.D.   On: 03/26/2014 16:26   Ct Head Wo Contrast  03/26/2014   CLINICAL DATA:  Confusion.  Frequent urinary tract infections.  EXAM: CT HEAD WITHOUT CONTRAST  TECHNIQUE: Contiguous axial images were obtained from the base of the skull through the vertex without intravenous contrast.  COMPARISON:  03/01/2014  FINDINGS: Sinuses/Soft tissues: Hypoplastic frontal sinuses. Mild hyperostosis frontalis interna. Clear mastoid air cells.  Intracranial: Dense bilateral vertebral and left greater than right carotid atherosclerosis.  No mass lesion, hemorrhage, hydrocephalus, acute infarct, intra-axial, or extra-axial fluid collection.  IMPRESSION: 1.  No acute intracranial abnormality. 2. Intracranial atherosclerosis.   Electronically Signed    By: Jeronimo Greaves M.D.   On: 03/26/2014 15:50   Ct Angio Neck W/cm &/or Wo/cm  03/01/2014   CLINICAL DATA:  Basilar artery stenosis follow-up  EXAM: CT ANGIOGRAPHY HEAD AND NECK  TECHNIQUE: Multidetector CT imaging of the head and neck was performed using the standard protocol during bolus administration of intravenous contrast. Multiplanar CT image reconstructions and MIPs were obtained to evaluate the vascular anatomy. Carotid stenosis measurements (when applicable) are obtained utilizing NASCET criteria, using the distal internal carotid diameter as the denominator.  CONTRAST:  OMNIPAQUE IOHEXOL 350 MG/ML SOLN  COMPARISON:  CTA 01/16/2013  FINDINGS: CT HEAD  Brain: Ventricle size is normal. Negative for acute infarct. Negative for hemorrhage or mass.  Calvarium and skull base: Negative  Paranasal sinuses: Negative  Orbits: Negative for mass. Enlarged left ophthalmic artery likely due to collateral flow from left common carotid artery occlusion.  CTA NECK  Aortic arch: Negative  Right carotid system: Right common carotid artery widely patent without stenosis. Right carotid bifurcation widely patent. Mild plaque in the  carotid bulb without significant stenosis or dissection.  Left carotid system: Left common carotid artery remains occluded at the origin, unchanged. There is reconstitution of the internal carotid artery at the bifurcation due to external carotid artery collaterals. There is less flow in the left internal carotid artery on today's study compared to the prior study. This is likely due to progression of disease in the carotid bulb however could be due to timing of the scan with slow flow present due to collaterals. Left internal carotid artery is a very small artery due to low flow. The does appear smaller than on the prior CT.  Vertebral arteries:Left vertebral artery origin from the aortic arch which is a normal variant. Both vertebral arteries are patent to the basilar without stenosis.   Skeleton: Negative  Other neck: Negative for mass or adenopathy  CTA HEAD  Anterior circulation: Atherosclerotic calcification in the cavernous carotid bilaterally. Mild stenosis on the right. Small caliber left internal carotid artery due to low flow from common carotid artery occlusion as well as atherosclerotic disease in the cavernous segment. Anterior and middle cerebral arteries are patent bilaterally without stenosis.  Posterior circulation: Both vertebral arteries are patent to the basilar. Mild calcification distal left vertebral artery. PICA patent bilaterally. AICA patent bilaterally. Just above the AICA is a mild stenosis in the basilar which is unchanged. Superior cerebellar and posterior cerebral arteries patent bilaterally.  Negative for cerebral aneurysm.  Venous sinuses: Patent  Anatomic variants: As above  Delayed phase: No enhancing lesions.  IMPRESSION: Left common carotid artery remains occluded unchanged. There is decreased caliber and flow in the left internal carotid artery which is likely due to progressive disease in the carotid bulb. Left internal carotid artery is supplied via external carotid collaterals.  Right carotid bifurcation widely patent  Both vertebral arteries widely patent.  Mild stenosis mid basilar unchanged.   Electronically Signed   By: Marlan Palauharles  Clark M.D.   On: 03/01/2014 15:37    Spoke with hospitalist who will admit pt  Rolan BuccoMelanie Alisse Tuite, MD 03/26/14 [phone]

## 2014-03-26 NOTE — ED Notes (Signed)
Pt brought in by EMS for AMS that was noticed by husband at around 1145 am.  Husband reports that pt went to bed last night and was normal.  Pt does not remember today.  Pt has repetitive questioning.  Pt has no other deficits, does not complain of dizziness, pain, weakness or any other symptoms.  EMS does report that pt has frequent UTI and has had dysuria "all of my life"

## 2014-03-26 NOTE — Consult Note (Signed)
Admission H&P    Chief Complaint: New onset memory difficulty.  HPI: Kelsey Dunn is an 74 y.o. female history of hypothyroidism, hypertension, hyperlipidemia, coronary artery disease, previous stroke and secondary heart block with pacemaker placement, brought to the emergency room following onset of acute memory difficulty. Patient's husband noted around 11:45 AM that she has difficulty remembering things that she had done earlier during the day and was also asking the same questions over and over. Memory difficulty continued after arriving in the emergency room. CT scan of her head was obtained which showed no acute intracranial abnormality. Laboratory studies were unremarkable including no signs of recurrent urinary tract infection. Patient has been taking aspirin and Plavix for antiplatelet therapy. She has no history of similar memory difficulty. No focal deficits were noted otherwise.  Past Medical History  Diagnosis Date  . Hypothyroidism 1990  . Labile hypertension 08/1991  . HLD (hyperlipidemia) 01/1997  . Fibromyalgia   . Bladder spasms   . Basilar artery stenosis     90%  . CAD (coronary artery disease)     a. 06/2009 cath: severe stenosis RCA with placement drug eluting stent;  b. 08/2011 cath: normal left main, luminal LAD irregularities, luminal LCx irregularities, luminal RCA irregularities and no ISR to mid-RCA DES, LVEF 60-65%; c. 02/2013 nonischemic CL, EF 79%.  . IC (interstitial cystitis)   . Obesity   . Hypertension   . Blood transfusion 1960's    w/childbirth  . CVA (cerebral infarction) December 2012  . Pacemaker 11/11/2011    dual chamber pacemaker  . Second degree Mobitz II AV block   . Family history of anesthesia complication     "first cousin had very fever and almost died" (03/08/2013)  . Malignant hyperthermia     "told to warn everybody before I had surgery that my 1st cousin had malignant hyperthermia; I've had symptoms of it too" (03-08-13)  . Pneumonia     "twice" (03-08-2013)  . Stroke     "they tell me I've had some mini strokes; nothing that's bothered me though" (2013-03-08)  . Arthritis     "fingers" (03/08/13)    Past Surgical History  Procedure Laterality Date  . Bladder suspension  1974  . Cholecystectomy  1985  . Cardiac catheterization  1993    normal  . Renal angioplasty  1993    normal  . Carotid US  11/93    Occl L comm carotid  . Visual evoked response  11/93; 1/01    normal  . US renal/aorta  12/93    Normal  . Emg  1/01    LE, RUE normal  . Carotid US  4/98    L comm occl R-ok  . Carotid US  01/30/03    L occl  R less 40%  . Dexa  10/15/04    Troch -0.4 o/w pos  . Carotid arteriogram      bilat  . Bilateral vert & subclavian angiogram  07/19/08    85-90% stenosis mid basilar art; occluded left vert artery; occluded left comm carotid artery w/ collat flow  . Basilar artery angioplasty  09/04/08    sm right brachial hematoma  . Cath single vess dz  06/13/09    70% stenosis RCA EF 65-70%  . Coronary angioplasty with stent placement  06/19/09  . Cataract extraction w/ intraocular lens implant Bilateral 2007-2008  . Eye surgery    . Vaginal hysterectomy  1974    dysmennorhea  . Insert /  replace / remove pacemaker  11/11/2011    MDT Adapta L implanted by Dr Johney Frame. [employer]  . Left heart catheterization with coronary angiogram N/A 08/10/2011    Procedure: LEFT HEART CATHETERIZATION WITH CORONARY ANGIOGRAM;  Surgeon: Kathleene Hazel, MD;  Location: [facility] C[facility] LAB;  Service: Cardiovascular;  Laterality: N/A;  . Permanent pacemaker insertion N/A 11/11/2011    Procedure: PERMANENT PACEMAKER INSERTION;  Surgeon: Hillis Range, MD;  Location: [facility] CATH LAB;  Service: Cardiovascular;  Laterality: N/A;  . Left heart catheterization with coronary angiogram N/A 07/28/2013    Procedure: LEFT HEART CATHETERIZATION WITH CORONARY ANGIOGRAM;  Surgeon: Kathleene Hazel, MD;  Location: [facility] CATH LAB;  Service: Cardiovascular;   Laterality: N/A;    Family History  Problem Relation Age of Onset  . Lung cancer Father   . Heart attack Mother   . Lymphoma Mother     s/p chemo  . Stroke Mother     x 2  . Diabetes Brother   . Colon cancer Neg Hx   . Breast cancer Maternal Aunt    Social History:  reports that she has never smoked. She has never used smokeless tobacco. She reports that she does not drink alcohol or use illicit drugs.  Allergies:  Allergies  Allergen Reactions  . Anesthetics, Amide Other (See Comments)    REACTION:FEVER, SYNCOPE  . Codeine Nausea And Vomiting    Reaction:Hallucinations  . Nitrofurantoin Nausea Only and Other (See Comments)    REACTION: Hallucinations  . Orphenadrine Citrate Nausea And Vomiting and Other (See Comments)    REACTION: Hallucination  . Pentosan Polysulfate Sodium Rash and Other (See Comments)    REACTION: rash/ severe headache    Medications: Patient's readmission medications were reviewed by me.  ROS: History obtained from spouse  General ROS: negative for - chills, fatigue, fever, night sweats, weight gain or weight loss Psychological ROS: negative for - behavioral disorder, hallucinations, memory difficulties, mood swings or suicidal ideation Ophthalmic ROS: negative for - blurry vision, double vision, eye pain or loss of vision ENT ROS: negative for - epistaxis, nasal discharge, oral lesions, sore throat, tinnitus or vertigo Allergy and Immunology ROS: negative for - hives or itchy/watery eyes Hematological and Lymphatic ROS: negative for - bleeding problems, bruising or swollen lymph nodes Endocrine ROS: negative for - galactorrhea, hair pattern changes, polydipsia/polyuria or temperature intolerance Respiratory ROS: negative for - cough, hemoptysis, shortness of breath or wheezing Cardiovascular ROS: negative for - chest pain, dyspnea on exertion, edema or irregular heartbeat Gastrointestinal ROS: negative for - abdominal pain, diarrhea, hematemesis,  nausea/vomiting or stool incontinence Genito-Urinary ROS: negative for - dysuria, hematuria, incontinence or urinary frequency/urgency Musculoskeletal ROS: negative for - joint swelling or muscular weakness Neurological ROS: as noted in HPI Dermatological ROS: negative for rash and skin lesion changes  Physical Examination: Blood pressure 177/71, pulse 71, temperature 98.2 F (36.8 C), temperature source Oral, resp. rate 22, height  (1.549 m), weight 81.647 kg (180 lb), SpO2 92 %.  HEENT-  Normocephalic, no lesions, without obvious abnormality.  Normal external eye and conjunctiva.  Normal TM's bilaterally.  Normal auditory canals and external ears. Normal external nose, mucus membranes and septum.  Normal pharynx. Neck supple with no masses, nodes, nodules or enlargement. Cardiovascular - regular rate and rhythm, S1, S2 normal, no murmur, click, rub or gallop Lungs - chest clear, no wheezing, rales, normal symmetric air entry Abdomen - soft, non-tender; bowel sounds normal; no masses,  no organomegaly Extremities - no edema,  no skin discoloration and no clubbing  Neurologic Examination: Mental Status: Alert, oriented to place but disoriented to time including current month and year; only unable to retain any new information.  Speech fluent without evidence of aphasia. Able to follow commands without difficulty. Cranial Nerves: II-Visual fields were normal. III/IV/VI-Pupils were equal and reacted. Extraocular movements were full and conjugate.    V/VII-no facial numbness and no facial weakness. VIII-normal. X-normal speech and symmetrical palatal movement. XI: trapezius strength/neck flexion strength normal bilaterally XII-midline tongue extension with normal strength. Motor: 5/5 bilaterally with normal tone and bulk Sensory: Normal throughout. Deep Tendon Reflexes: 1+ and symmetric. Plantars: Flexor bilaterally Cerebellar: Normal finger-to-nose testing. Carotid auscultation:  Normal  Results for orders placed or performed during the hospital encounter of 03/26/14 (from the past 48 hour(s))  Urinalysis, Routine w reflex microscopic     Status: None   Collection Time: 03/26/14  1:30 PM  Result Value Ref Range   Color, Urine YELLOW YELLOW   APPearance CLEAR CLEAR   Specific Gravity, Urine 1.006 1.005 - 1.030   pH 6.5 5.0 - 8.0   Glucose, UA NEGATIVE NEGATIVE mg/dL   Hgb urine dipstick NEGATIVE NEGATIVE   Bilirubin Urine NEGATIVE NEGATIVE   Ketones, ur NEGATIVE NEGATIVE mg/dL   Protein, ur NEGATIVE NEGATIVE mg/dL   Urobilinogen, UA 0.2 0.0 - 1.0 mg/dL   Nitrite NEGATIVE NEGATIVE   Leukocytes, UA NEGATIVE NEGATIVE    Comment: MICROSCOPIC NOT DONE ON URINES WITH NEGATIVE PROTEIN, BLOOD, LEUKOCYTES, NITRITE, OR GLUCOSE <1000 mg/dL.  CBC     Status: Abnormal   Collection Time: 03/26/14  4:31 PM  Result Value Ref Range   WBC 9.7 4.0 - 10.5 K/uL   RBC 5.03 3.87 - 5.11 MIL/uL   Hemoglobin 15.1 (H) 12.0 - 15.0 g/dL   HCT 81.1 91.4 - 78.2 %   MCV 87.3 78.0 - 100.0 fL   MCH 30.0 26.0 - 34.0 pg   MCHC 34.4 30.0 - 36.0 g/dL   RDW 95.6 21.3 - 08.6 %   Platelets 217 150 - 400 K/uL  I-stat troponin, ED     Status: None   Collection Time: 03/26/14  4:35 PM  Result Value Ref Range   Troponin i, poc 0.01 0.00 - 0.08 ng/mL   Comment 3            Comment: Due to the release kinetics of cTnI, a negative result within the first hours of the onset of symptoms does not rule out myocardial infarction with certainty. If myocardial infarction is still suspected, repeat the test at appropriate intervals.    Dg Chest 2 View  03/26/2014   CLINICAL DATA:  Confusion since 11:30 a.m.  EXAM: CHEST  2 VIEW  COMPARISON:  02/27/2013  FINDINGS: Dual-chamber ICD/pacer from the left with stable lead orientation. No cardiomegaly. No aortic or hilar contour abnormality. Minimal scarring or atelectasis at the left base. There is no edema, consolidation, effusion, or pneumothorax.   IMPRESSION: No active cardiopulmonary disease.   Electronically Signed   By: Marnee Spring M.D.   On: 03/26/2014 16:26   Ct Head Wo Contrast  03/26/2014   CLINICAL DATA:  Confusion.  Frequent urinary tract infections.  EXAM: CT HEAD WITHOUT CONTRAST  TECHNIQUE: Contiguous axial images were obtained from the base of the skull through the vertex without intravenous contrast.  COMPARISON:  03/01/2014  FINDINGS: Sinuses/Soft tissues: Hypoplastic frontal sinuses. Mild hyperostosis frontalis interna. Clear mastoid air cells.  Intracranial: Dense bilateral vertebral and left  greater than right carotid atherosclerosis.  No mass lesion, hemorrhage, hydrocephalus, acute infarct, intra-axial, or extra-axial fluid collection.  IMPRESSION: 1.  No acute intracranial abnormality. 2. Intracranial atherosclerosis.   Electronically Signed   By: Jeronimo GreavesKyle  Talbot M.D.   On: 03/26/2014 15:50    Assessment/Plan 74 year old lady with multiple medical problems as described above, presenting with clinical findings consistent with transient global amnesia.  Recommendations: 1. Hemoglobin A1c and fasting lipid panel 2. 2-D echocardiogram 3. Carotid Doppler study 4. EEG, routine adult study 5. Continue aspirin 81 mg per day and Plavix 75 mg per day 6. Telemetry monitoring overnight  C.R. Roseanne RenoStewart, MD [facility] [phone]  03/26/2014, 5:09 PM

## 2014-03-26 NOTE — ED Provider Notes (Signed)
CSN: [id number]     Arrival date & time 03/26/14  1310 History   First MD Initiated Contact with Patient 03/26/14 1312     Chief Complaint  Patient presents with  . Altered Mental Status     (Consider location/radiation/quality/duration/timing/severity/associated sxs/prior Treatment) Patient is a 74 y.o. female presenting with altered mental status. The history is provided by the patient.  Altered Mental Status Presenting symptoms: confusion (Quality: repetitive questioning, unclear of year. Oriented to past)   Severity:  Moderate Most recent episode:  Today Episode history:  Single Duration:  2 hours Timing:  Constant Progression:  Unchanged Chronicity:  New Context: not dementia, not drug use, not a nursing home resident and not a recent change in medication   Associated symptoms: no abdominal pain, no fever and no vomiting     Past Medical History  Diagnosis Date  . Hypothyroidism 1990  . Labile hypertension 08/1991  . HLD (hyperlipidemia) 01/1997  . Fibromyalgia   . Bladder spasms   . Basilar artery stenosis     90%  . CAD (coronary artery disease)     a. 06/2009 cath: severe stenosis RCA with placement drug eluting stent;  b. 08/2011 cath: normal left main, luminal LAD irregularities, luminal LCx irregularities, luminal RCA irregularities and no ISR to mid-RCA DES, LVEF 60-65%; c. 02/2013 nonischemic CL, EF 79%.  . IC (interstitial cystitis)   . Obesity   . Hypertension   . Blood transfusion 1960's    w/childbirth  . CVA (cerebral infarction) December 2012  . Pacemaker 11/11/2011    dual chamber pacemaker  . Second degree Mobitz II AV block   . Family history of anesthesia complication     "first cousin had very fever and almost died" (02/28/13)  . Malignant hyperthermia     "told to warn everybody before I had surgery that my 1st cousin had malignant hyperthermia; I've had symptoms of it too" (02/28/13)  . Pneumonia     "twice" (02/28/13)  . Stroke     "they  tell me I've had some mini strokes; nothing that's bothered me though" (02-28-13)  . Arthritis     "fingers" (2013-02-28)   Past Surgical History  Procedure Laterality Date  . Bladder suspension  1974  . Cholecystectomy  1985  . Cardiac catheterization  1993    normal  . Renal angioplasty  1993    normal  . Carotid US  11/93    Occl L comm carotid  . Visual evoked response  11/93; 1/01    normal  . US renal/aorta  12/93    Normal  . Emg  1/01    LE, RUE normal  . Carotid US  4/98    L comm occl R-ok  . Carotid US  01/30/03    L occl  R less 40%  . Dexa  10/15/04    Troch -0.4 o/w pos  . Carotid arteriogram      bilat  . Bilateral vert & subclavian angiogram  07/19/08    85-90% stenosis mid basilar art; occluded left vert artery; occluded left comm carotid artery w/ collat flow  . Basilar artery angioplasty  09/04/08    sm right brachial hematoma  . Cath single vess dz  06/13/09    70% stenosis RCA EF 65-70%  . Coronary angioplasty with stent placement  06/19/09  . Cataract extraction w/ intraocular lens implant Bilateral 2007-2008  . Eye surgery    . Vaginal hysterectomy  1974  dysmennorhea  . Insert / replace / remove pacemaker  11/11/2011    MDT Adapta L implanted by Dr Johney Frame. [employer]  . Left heart catheterization with coronary angiogram N/A 08/10/2011    Procedure: LEFT HEART CATHETERIZATION WITH CORONARY ANGIOGRAM;  Surgeon: Kathleene Hazel, MD;  Location: [facility] CATH LAB;  Service: Cardiovascular;  Laterality: N/A;  . Permanent pacemaker insertion N/A 11/11/2011    Procedure: PERMANENT PACEMAKER INSERTION;  Surgeon: Hillis Range, MD;  Location: [facility] CATH LAB;  Service: Cardiovascular;  Laterality: N/A;  . Left heart catheterization with coronary angiogram N/A 07/28/2013    Procedure: LEFT HEART CATHETERIZATION WITH CORONARY ANGIOGRAM;  Surgeon: Kathleene Hazel, MD;  Location: [facility] CATH LAB;  Service: Cardiovascular;  Laterality: N/A;   Family History  Problem  Relation Age of Onset  . Lung cancer Father   . Heart attack Mother   . Lymphoma Mother     s/p chemo  . Stroke Mother     x 2  . Diabetes Brother   . Colon cancer Neg Hx   . Breast cancer Maternal Aunt    History  Substance Use Topics  . Smoking status: Never Smoker   . Smokeless tobacco: Never Used  . Alcohol Use: No   OB History    No data available     Review of Systems  Constitutional: Negative for fever.  Respiratory: Negative for cough and shortness of breath.   Gastrointestinal: Negative for vomiting and abdominal pain.  Psychiatric/Behavioral: Positive for confusion (Quality: repetitive questioning, unclear of year. Oriented to past).  All other systems reviewed and are negative.     Allergies  Anesthetics, amide; Codeine; Nitrofurantoin; Orphenadrine citrate; and Pentosan polysulfate sodium  Home Medications   Prior to Admission medications   Medication Sig Start Date End Date Taking? Authorizing Provider  amLODipine (NORVASC) 5 MG tablet TAKE 1 TABLET EVERY DAY 12/25/13   Joaquim Nam, MD  aspirin EC 81 MG tablet Take 81 mg by mouth every morning.    Historical Provider, MD  clopidogrel (PLAVIX) 75 MG tablet TAKE 1 TABLET BY MOUTH EVERY DAY 02/19/14   Joaquim Nam, MD  Coenzyme Q10 200 MG capsule Take 200 mg by mouth 2 (two) times daily.    Historical Provider, MD  furosemide (LASIX) 40 MG tablet Take 40 mg by mouth daily as needed for edema.    Historical Provider, MD  lansoprazole (PREVACID) 15 MG capsule Take 15 mg by mouth as needed (reflux).     Historical Provider, MD  levothyroxine (SYNTHROID, LEVOTHROID) 100 MCG tablet TAKE 1 TABLET DAILY AS DIRECTED 11/09/13   Joaquim Nam, MD  losartan-hydrochlorothiazide [facility]) 100-25 MG per tablet TAKE 1 TABLET BY MOUTH EVERY DAY 02/19/14   Joaquim Nam, MD  nitroGLYCERIN (NITROSTAT) 0.4 MG SL tablet Place 0.4 mg under the tongue every 5 (five) minutes as needed (MAX 3 TABLETS). For chest pain     Historical Provider, MD  Omega-3 Fatty Acids (FISH OIL BURP-LESS) 500 MG CAPS Take 1,000 mg by mouth 2 (two) times daily.     Historical Provider, MD  Phenazopyridine HCl (AZO TABS PO) Take 1 tablet by mouth daily as needed (bladder spasms).     Historical Provider, MD  simvastatin (ZOCOR) 10 MG tablet TAKE 1 TABLET BY MOUTH EVERY DAY AT 6PM 02/19/14   Joaquim Nam, MD   BP 183/70 mmHg  Pulse 89  Temp(Src) 98.2 F (36.8 C) (Oral)  Resp 12  Ht  (1.549 m)  Wt 180 lb (81.647 kg)  BMI 34.03 kg/m2  SpO2 98% Physical Exam  Constitutional: She is oriented to person, place, and time. She appears well-developed and well-nourished. No distress.  HENT:  Head: Normocephalic and atraumatic.  Mouth/Throat: Oropharynx is clear and moist.  Eyes: EOM are normal. Pupils are equal, round, and reactive to light.  Neck: Normal range of motion. Neck supple.  Cardiovascular: Normal rate and regular rhythm.  Exam reveals no friction rub.   No murmur heard. Pulmonary/Chest: Effort normal and breath sounds normal. No respiratory distress. She has no wheezes. She has no rales.  Abdominal: Soft. She exhibits no distension. There is no tenderness. There is no rebound.  Musculoskeletal: Normal range of motion. She exhibits no edema.  Neurological: She is alert and oriented to person, place, and time. No cranial nerve deficit or sensory deficit. She exhibits normal muscle tone. GCS eye subscore is 4. GCS verbal subscore is 4. GCS motor subscore is 6.  Skin: She is not diaphoretic.  Nursing note and vitals reviewed.   ED Course  Procedures (including critical care time) Labs Review Labs Reviewed  URINALYSIS, ROUTINE W REFLEX MICROSCOPIC  CBC  BASIC METABOLIC PANEL  I-STAT TROPOININ, ED    Imaging Review Ct Head Wo Contrast  03/26/2014   CLINICAL DATA:  Confusion.  Frequent urinary tract infections.  EXAM: CT HEAD WITHOUT CONTRAST  TECHNIQUE: Contiguous axial images were obtained from the base of  the skull through the vertex without intravenous contrast.  COMPARISON:  03/01/2014  FINDINGS: Sinuses/Soft tissues: Hypoplastic frontal sinuses. Mild hyperostosis frontalis interna. Clear mastoid air cells.  Intracranial: Dense bilateral vertebral and left greater than right carotid atherosclerosis.  No mass lesion, hemorrhage, hydrocephalus, acute infarct, intra-axial, or extra-axial fluid collection.  IMPRESSION: 1.  No acute intracranial abnormality. 2. Intracranial atherosclerosis.   Electronically Signed   By: Jeronimo GreavesKyle  Talbot M.D.   On: 03/26/2014 15:50     EKG Interpretation   Date/Time:  Monday March 26 2014 14:18:19 EDT Ventricular Rate:  79 PR Interval:  169 QRS Duration: 132 QT Interval:  416 QTC Calculation: 477 R Axis:   37 Text Interpretation:  Sinus rhythm Right bundle branch block RBBB, new  Confirmed by Gwendolyn GrantWALDEN  MD, Hero Mccathern ([id number]) on 03/26/2014 3:03:11 PM      MDM   Final diagnoses:  Confusion  Memory loss  TGA (transient global amnesia)    74 year old female here with confusion. Began about 2 hours ago. Noticed these had repetitive questioning. Denies any dizziness, blurry vision, chest pain, nausea, vomiting. AFVSS here. Patient confused, doesn't know the year, situation. Does remember her husband's name and her birth year. Hx of TIAs, but no hx of dementia or mental confusion. Moving all extremities. Normal strength and sensation. Plan on CT of her head, labs. CT ok.  I spoke with Dr. Roseanne RenoStewart of Neurology, he stated this is likely Transient Global Amnesia. Will admit for observation. Plan for admission.  Elwin MochaBlair Shantera Monts, MD 03/31/14 [phone]

## 2014-03-27 ENCOUNTER — Observation Stay (HOSPITAL_COMMUNITY): Payer: Commercial Managed Care - HMO

## 2014-03-27 DIAGNOSIS — Z8673 Personal history of transient ischemic attack (TIA), and cerebral infarction without residual deficits: Secondary | ICD-10-CM | POA: Diagnosis not present

## 2014-03-27 DIAGNOSIS — M797 Fibromyalgia: Secondary | ICD-10-CM | POA: Diagnosis not present

## 2014-03-27 DIAGNOSIS — E785 Hyperlipidemia, unspecified: Secondary | ICD-10-CM | POA: Diagnosis not present

## 2014-03-27 DIAGNOSIS — G454 Transient global amnesia: Secondary | ICD-10-CM | POA: Diagnosis not present

## 2014-03-27 DIAGNOSIS — R410 Disorientation, unspecified: Secondary | ICD-10-CM | POA: Diagnosis not present

## 2014-03-27 DIAGNOSIS — E039 Hypothyroidism, unspecified: Secondary | ICD-10-CM | POA: Diagnosis not present

## 2014-03-27 DIAGNOSIS — I651 Occlusion and stenosis of basilar artery: Secondary | ICD-10-CM | POA: Diagnosis not present

## 2014-03-27 DIAGNOSIS — R413 Other amnesia: Secondary | ICD-10-CM | POA: Diagnosis not present

## 2014-03-27 DIAGNOSIS — E669 Obesity, unspecified: Secondary | ICD-10-CM | POA: Diagnosis not present

## 2014-03-27 DIAGNOSIS — Z950 Presence of cardiac pacemaker: Secondary | ICD-10-CM | POA: Diagnosis not present

## 2014-03-27 DIAGNOSIS — I2510 Atherosclerotic heart disease of native coronary artery without angina pectoris: Secondary | ICD-10-CM | POA: Diagnosis not present

## 2014-03-27 DIAGNOSIS — I10 Essential (primary) hypertension: Secondary | ICD-10-CM | POA: Diagnosis not present

## 2014-03-27 LAB — CREATININE, SERUM
CREATININE: 0.94 mg/dL (ref 0.50–1.10)
GFR calc Af Amer: 69 mL/min — ABNORMAL LOW (ref >90)
GFR calc non Af Amer: 60 mL/min — ABNORMAL LOW (ref >90)

## 2014-03-27 LAB — TSH: TSH: 1.406 u[IU]/mL (ref 0.350–4.500)

## 2014-03-27 LAB — COMPREHENSIVE METABOLIC PANEL
ALK PHOS: 52 U/L (ref 39–117)
ALT: 18 U/L (ref 0–35)
ANION GAP: 11 (ref 5–15)
AST: 20 U/L (ref 0–37)
Albumin: 3.7 g/dL (ref 3.5–5.2)
BUN: 14 mg/dL (ref 6–23)
CHLORIDE: 105 mmol/L (ref 96–112)
CO2: 25 mmol/L (ref 19–32)
Calcium: 9.3 mg/dL (ref 8.4–10.5)
Creatinine, Ser: 0.92 mg/dL (ref 0.50–1.10)
GFR calc non Af Amer: 61 mL/min — ABNORMAL LOW (ref >90)
GFR, EST AFRICAN AMERICAN: 71 mL/min — AB (ref >90)
GLUCOSE: 102 mg/dL — AB (ref 70–99)
POTASSIUM: 3.5 mmol/L (ref 3.5–5.1)
SODIUM: 141 mmol/L (ref 135–145)
Total Bilirubin: 0.8 mg/dL (ref 0.3–1.2)
Total Protein: 6.4 g/dL (ref 6.0–8.3)

## 2014-03-27 LAB — AMMONIA: Ammonia: 23 umol/L (ref 11–32)

## 2014-03-27 LAB — CBC WITH DIFFERENTIAL/PLATELET
BASOS PCT: 0 % (ref 0–1)
Basophils Absolute: 0 10*3/uL (ref 0.0–0.1)
EOS ABS: 0.2 10*3/uL (ref 0.0–0.7)
Eosinophils Relative: 3 % (ref 0–5)
HCT: 43.7 % (ref 36.0–46.0)
Hemoglobin: 14.7 g/dL (ref 12.0–15.0)
Lymphocytes Relative: 30 % (ref 12–46)
Lymphs Abs: 1.9 10*3/uL (ref 0.7–4.0)
MCH: 29.8 pg (ref 26.0–34.0)
MCHC: 33.6 g/dL (ref 30.0–36.0)
MCV: 88.5 fL (ref 78.0–100.0)
MONOS PCT: 8 % (ref 3–12)
Monocytes Absolute: 0.5 10*3/uL (ref 0.1–1.0)
NEUTROS ABS: 3.8 10*3/uL (ref 1.7–7.7)
NEUTROS PCT: 59 % (ref 43–77)
PLATELETS: 221 10*3/uL (ref 150–400)
RBC: 4.94 MIL/uL (ref 3.87–5.11)
RDW: 13.3 % (ref 11.5–15.5)
WBC: 6.5 10*3/uL (ref 4.0–10.5)

## 2014-03-27 LAB — LIPID PANEL
Cholesterol: 150 mg/dL (ref 0–200)
HDL: 42 mg/dL (ref >39)
LDL Cholesterol: 77 mg/dL (ref 0–99)
TRIGLYCERIDES: 154 mg/dL — AB (ref <150)
Total CHOL/HDL Ratio: 3.6 RATIO
VLDL: 31 mg/dL (ref 0–40)

## 2014-03-27 LAB — CBC
HCT: 44.4 % (ref 36.0–46.0)
HEMOGLOBIN: 15.1 g/dL — AB (ref 12.0–15.0)
MCH: 30 pg (ref 26.0–34.0)
MCHC: 34 g/dL (ref 30.0–36.0)
MCV: 88.1 fL (ref 78.0–100.0)
PLATELETS: 226 10*3/uL (ref 150–400)
RBC: 5.04 MIL/uL (ref 3.87–5.11)
RDW: 13.1 % (ref 11.5–15.5)
WBC: 8.1 10*3/uL (ref 4.0–10.5)

## 2014-03-27 LAB — RPR: RPR Ser Ql: NONREACTIVE

## 2014-03-27 LAB — HIV ANTIBODY (ROUTINE TESTING W REFLEX): HIV Screen 4th Generation wRfx: NONREACTIVE

## 2014-03-27 LAB — FOLATE: FOLATE: 19.7 ng/mL

## 2014-03-27 LAB — VITAMIN B12: Vitamin B-12: 345 pg/mL (ref 211–911)

## 2014-03-27 NOTE — Progress Notes (Signed)
UR completed 

## 2014-03-27 NOTE — Progress Notes (Signed)
Subjective: Patient is back to her baseline.  Still does not recall events of yesterday but is fully aware of this mornings events.   Objective: Current vital signs: BP 142/76 mmHg  Pulse 62  Temp(Src) 97.8 F (36.6 C) (Oral)  Resp 16  Ht 4\' 11"  (1.499 m)  Wt 89.8 kg (197 lb 15.6 oz)  BMI 39.96 kg/m2  SpO2 96% Vital signs in last 24 hours: Temp:  [97.8 F (36.6 C)-98.7 F (37.1 C)] 97.8 F (36.6 C) (03/22 0517) Pulse Rate:  [60-89] 62 (03/21 2231) Resp:  [11-22] 16 (03/22 0517) BP: (128-183)/(46-87) 142/76 mmHg (03/22 0517) SpO2:  [92 %-100 %] 96 % (03/22 0517) Weight:  [81.647 kg (180 lb)-89.8 kg (197 lb 15.6 oz)] 89.8 kg (197 lb 15.6 oz) (03/22 0448)  Intake/Output from previous day: 03/21 0701 - 03/22 0700 In: -  Out: 250 [Urine:250] Intake/Output this shift: Total I/O In: 300 [P.O.:300] Out: 100 [Urine:100] Nutritional status: Diet Heart  Neurologic Exam: Mental Status: Alert, oriented to place, date, year and events of this AM. Speech fluent without evidence of aphasia. Able to follow commands without difficulty. Cranial Nerves: II-Visual fields were normal. III/IV/VI-Pupils were equal and reacted. Extraocular movements were full and conjugate.  V/VII-no facial numbness and no facial weakness. VIII-normal. X-normal speech and symmetrical palatal movement. XI: trapezius strength/neck flexion strength normal bilaterally XII-midline tongue extension with normal strength. Motor: 5/5 bilaterally with normal tone and bulk Sensory: Normal throughout. Deep Tendon Reflexes: 1+ and symmetric. Plantars: Flexor bilaterally Cerebellar: Normal finger-to-nose testing.  Lab Results: Basic Metabolic Panel:  Recent Labs Lab 03/26/14 1631 03/26/14 2330 03/27/14 0640  NA 141  --  141  K 3.6  --  3.5  CL 105  --  105  CO2 26  --  25  GLUCOSE 115*  --  102*  BUN 18  --  14  CREATININE 0.95 0.94 0.92  CALCIUM 9.5  --  9.3    Liver Function Tests:  Recent  Labs Lab 03/27/14 0640  AST 20  ALT 18  ALKPHOS 52  BILITOT 0.8  PROT 6.4  ALBUMIN 3.7   No results for input(s): LIPASE, AMYLASE in the last 168 hours.  Recent Labs Lab 03/26/14 2330  AMMONIA 23    CBC:  Recent Labs Lab 03/26/14 1631 03/26/14 2330 03/27/14 0640  WBC 9.7 8.1 6.5  NEUTROABS  --   --  3.8  HGB 15.1* 15.1* 14.7  HCT 43.9 44.4 43.7  MCV 87.3 88.1 88.5  PLT 217 226 221    Cardiac Enzymes: No results for input(s): CKTOTAL, CKMB, CKMBINDEX, TROPONINI in the last 168 hours.  Lipid Panel:  Recent Labs Lab 03/26/14 2330  CHOL 150  TRIG 154*  HDL 42  CHOLHDL 3.6  VLDL 31  LDLCALC 77    CBG: No results for input(s): GLUCAP in the last 168 hours.  Microbiology: Results for orders placed or performed in visit on 12/08/13  Urine culture     Status: None   Collection Time: 12/08/13  9:45 AM  Result Value Ref Range Status   Culture ENTEROCOCCUS SPECIES  Final   Colony Count >=100,000 COLONIES/ML  Final   Organism ID, Bacteria ENTEROCOCCUS SPECIES  Final      Susceptibility   Enterococcus species -  (no method available)    AMPICILLIN <=2 Sensitive     LEVOFLOXACIN 1 Sensitive     NITROFURANTOIN <=16 Sensitive     VANCOMYCIN 1 Sensitive     TETRACYCLINE >=16 Resistant  Coagulation Studies: No results for input(s): LABPROT, INR in the last 72 hours.  Imaging: Dg Chest 2 View  03/26/2014   CLINICAL DATA:  Confusion since 11:30 a.m.  EXAM: CHEST  2 VIEW  COMPARISON:  02/27/2013  FINDINGS: Dual-chamber ICD/pacer from the left with stable lead orientation. No cardiomegaly. No aortic or hilar contour abnormality. Minimal scarring or atelectasis at the left base. There is no edema, consolidation, effusion, or pneumothorax.  IMPRESSION: No active cardiopulmonary disease.   Electronically Signed   By: Marnee Spring M.D.   On: 03/26/2014 16:26   Ct Head Wo Contrast  03/26/2014   CLINICAL DATA:  Confusion.  Frequent urinary tract infections.   EXAM: CT HEAD WITHOUT CONTRAST  TECHNIQUE: Contiguous axial images were obtained from the base of the skull through the vertex without intravenous contrast.  COMPARISON:  03/01/2014  FINDINGS: Sinuses/Soft tissues: Hypoplastic frontal sinuses. Mild hyperostosis frontalis interna. Clear mastoid air cells.  Intracranial: Dense bilateral vertebral and left greater than right carotid atherosclerosis.  No mass lesion, hemorrhage, hydrocephalus, acute infarct, intra-axial, or extra-axial fluid collection.  IMPRESSION: 1.  No acute intracranial abnormality. 2. Intracranial atherosclerosis.   Electronically Signed   By: Jeronimo Greaves M.D.   On: 03/26/2014 15:50    Medications:  Scheduled: . amLODipine  5 mg Oral Daily  . aspirin EC  81 mg Oral q morning - 10a  . clopidogrel  75 mg Oral Daily  . enoxaparin (LOVENOX) injection  40 mg Subcutaneous QHS  . losartan  100 mg Oral Daily   And  . hydrochlorothiazide  25 mg Oral Daily  . levothyroxine  100 mcg Oral Q breakfast  . omega-3 acid ethyl esters  1 g Oral BID  . pantoprazole  40 mg Oral Daily  . simvastatin  10 mg Oral q1800  . sodium chloride  3 mL Intravenous Q12H    Assessment/Plan:  74 year old lady with multiple medical problems , presenting with clinical findings consistent with transient global amnesia. CT head with no acute abnormalities, FLP with LDL 77, B12, TSH, ammonia WNL. A1c, carotid doppler and echo pending and EEG reading pending.    Felicie Morn PA-C [facility] [phone]   I personally participated in this patient's evaluation and management, including relating the above clinical impression and management recommendations. Mental status has returned to normal with no signs of residual amnesia area EEG is normal. Echocardiogram showed no potential source for emboli. Carotid Doppler study is pending. If carotid Doppler is negative, no further neurological intervention is indicated. Patient may be discharged at that  point.  Venetia Maxon M.D. [facility] [phone]  03/27/2014, 1:12 PM

## 2014-03-27 NOTE — Progress Notes (Signed)
NURSING PROGRESS NOTE  Hal HopeConnie R Long [id number] Admission Data: 03/27/2014 5:46 AM Attending Provider: Kathlen ModyVijaya Akula, MD WUJ:[id number]:Graham Para Marchuncan, MD Code Status: full  Hal HopeConnie R Salts is a 74 y.o. female patient admitted from ED:  -No acute distress noted.  -No complaints of shortness of breath.  -No complaints of chest pain.   Cardiac Monitoring: Box # [id number] in place. Cardiac monitor yields:normal sinus rhythm.  Blood pressure 142/76, pulse 62, temperature 98.7 F (37.1 C), temperature source Oral, resp. rate 16, height 4\' 11"  (1.499 m), weight 89.8 kg (197 lb 15.6 oz), SpO2 96 %.   IV Fluids:  IV in place, occlusive dsg intact without redness, IV cath antecubital left, condition patent and no redness none.   Allergies:  Anesthetics, amide; Codeine; Nitrofurantoin; Orphenadrine citrate; and Pentosan polysulfate sodium  Past Medical History:   has a past medical history of Hypothyroidism (1990); Labile hypertension (08/1991); HLD (hyperlipidemia) (01/1997); Fibromyalgia; Bladder spasms; Basilar artery stenosis; CAD (coronary artery disease); IC (interstitial cystitis); Obesity; Hypertension; Blood transfusion (1960's); CVA (cerebral infarction) (December 2012); Pacemaker (11/11/2011); Second degree Mobitz II AV block; Family history of anesthesia complication; Malignant hyperthermia; Pneumonia; Stroke; and Arthritis.  Past Surgical History:   has past surgical history that includes Bladder suspension (1974); Cholecystectomy (1985); Cardiac catheterization (1993); Renal angioplasty (1993); Carotid US (11/93); Visual evoked response (11/93; 1/01); us renal/aorta (12/93); EMG (1/01); Carotid US (4/98); Carotid US (01/30/03); DEXA (10/15/04); carotid arteriogram; Bilateral Vert & Subclavian Angiogram (07/19/08); basilar artery angioplasty (09/04/08); Cath single vess dz (06/13/09); Coronary angioplasty with stent (06/19/09); Cataract extraction w/ intraocular lens implant (Bilateral, 2007-2008); Eye surgery;  Vaginal hysterectomy (1974); Insert / replace / remove pacemaker (11/11/2011); left heart catheterization with coronary angiogram (N/A, 08/10/2011); permanent pacemaker insertion (N/A, 11/11/2011); and left heart catheterization with coronary angiogram (N/A, 07/28/2013).  Social History:   reports that she has never smoked. She has never used smokeless tobacco. She reports that she does not drink alcohol or use illicit drugs.  Skin: WDL  Patient/Family oriented to room. Information packet given to patient/family. Admission inpatient armband information verified with patient/family to include name and date of birth and placed on patient arm. Side rails up x 2, fall assessment and education completed with patient/family. Patient/family able to verbalize understanding of risk associated with falls and verbalized understanding to call for assistance before getting out of bed. Call light within reach. Patient/family able to voice and demonstrate understanding of unit orientation instructions. All orientation information needs reinforcement as patient's admitting diagnosis is amnesia.

## 2014-03-27 NOTE — Progress Notes (Signed)
EEG Completed; Results Pending  

## 2014-03-27 NOTE — Progress Notes (Signed)
[facility] PROGRESS NOTE  Kelsey HopeConnie R Dunn [id number] DOB: Mar 09, 1940 DOA: 03/26/2014 PCP: Crawford GivensGraham [name], MD Interim summary: 74 year old admitted for sudden memory loss. She was admitted for evaluation of global amnesia. Neurology consulted. She has a pacemaker .  Assessment/Plan: 1. Transient Global amnesia:  admitted to telemetry.  HIV antibody, RPR, vit b12 and folate are within normal limits. No signs of infection.  Echo and carotid duplex are pending.  She is alert and oriented and reported her memory is slowly coming back.  Notified Dr Peggye Formdeveshvar of patient's admission.   Hypothyroidism: tsh within normal limits.  Resume synthroid.   Hypertension: Controlled. Resume home meds.    Hyperlipidemia: LDL is 77 and resume zocor.    Code Status: full code Family Communication: family at bedside Disposition Plan: pending.    Consultants:  neurology  Procedures:  CT head  Echo  Carotid duplex  Antibiotics:  none  HPI/Subjective: Comfortable denies any new complaints.   Objective: Filed Vitals:   03/27/14 1509  BP: 134/64  Pulse: 64  Temp: 98 F (36.7 C)  Resp: 24    Intake/Output Summary (Last 24 hours) at 03/27/14 1730 Last data filed at 03/27/14 1701  Gross per 24 hour  Intake    420 ml  Output    550 ml  Net   -130 ml   Filed Weights   03/26/14 1317 03/26/14 2231 03/27/14 0448  Weight: 81.647 kg (180 lb) 88 kg (194 lb 0.1 oz) 89.8 kg (197 lb 15.6 oz)    Exam:   General:  Alert afebrile comfortable  Cardiovascular: s1s2  Respiratory: ctab  Abdomen: soft non tender non distended bowel sounds heard  Musculoskeletal: no pedal edema.   Neuro:  She is alert and oriented. Able to move all extremities, no speech difficulty, nofacial droop, did not test her gait.   She still doesn't remember what happened yesterday.   Data Reviewed: Basic Metabolic Panel:  Recent Labs Lab 03/26/14 1631 03/26/14 2330 03/27/14 0640  NA  141  --  141  K 3.6  --  3.5  CL 105  --  105  CO2 26  --  25  GLUCOSE 115*  --  102*  BUN 18  --  14  CREATININE 0.95 0.94 0.92  CALCIUM 9.5  --  9.3   Liver Function Tests:  Recent Labs Lab 03/27/14 0640  AST 20  ALT 18  ALKPHOS 52  BILITOT 0.8  PROT 6.4  ALBUMIN 3.7   No results for input(s): LIPASE, AMYLASE in the last 168 hours.  Recent Labs Lab 03/26/14 2330  AMMONIA 23   CBC:  Recent Labs Lab 03/26/14 1631 03/26/14 2330 03/27/14 0640  WBC 9.7 8.1 6.5  NEUTROABS  --   --  3.8  HGB 15.1* 15.1* 14.7  HCT 43.9 44.4 43.7  MCV 87.3 88.1 88.5  PLT 217 226 221   Cardiac Enzymes: No results for input(s): CKTOTAL, CKMB, CKMBINDEX, TROPONINI in the last 168 hours. BNP (last 3 results) No results for input(s): BNP in the last 8760 hours.  ProBNP (last 3 results) No results for input(s): PROBNP in the last 8760 hours.  CBG: No results for input(s): GLUCAP in the last 168 hours.  No results found for this or any previous visit (from the past 240 hour(s)).   Studies: Dg Chest 2 View  03/26/2014   CLINICAL DATA:  Confusion since 11:30 a.m.  EXAM: CHEST  2 VIEW  COMPARISON:  02/27/2013  FINDINGS: Dual-chamber  ICD/pacer from the left with stable lead orientation. No cardiomegaly. No aortic or hilar contour abnormality. Minimal scarring or atelectasis at the left base. There is no edema, consolidation, effusion, or pneumothorax.  IMPRESSION: No active cardiopulmonary disease.   Electronically Signed   By: Marnee Spring M.D.   On: 03/26/2014 16:26   Ct Head Wo Contrast  03/26/2014   CLINICAL DATA:  Confusion.  Frequent urinary tract infections.  EXAM: CT HEAD WITHOUT CONTRAST  TECHNIQUE: Contiguous axial images were obtained from the base of the skull through the vertex without intravenous contrast.  COMPARISON:  03/01/2014  FINDINGS: Sinuses/Soft tissues: Hypoplastic frontal sinuses. Mild hyperostosis frontalis interna. Clear mastoid air cells.  Intracranial: Dense  bilateral vertebral and left greater than right carotid atherosclerosis.  No mass lesion, hemorrhage, hydrocephalus, acute infarct, intra-axial, or extra-axial fluid collection.  IMPRESSION: 1.  No acute intracranial abnormality. 2. Intracranial atherosclerosis.   Electronically Signed   By: Jeronimo Greaves M.D.   On: 03/26/2014 15:50    Scheduled Meds: . amLODipine  5 mg Oral Daily  . aspirin EC  81 mg Oral q morning - 10a  . clopidogrel  75 mg Oral Daily  . enoxaparin (LOVENOX) injection  40 mg Subcutaneous QHS  . losartan  100 mg Oral Daily   And  . hydrochlorothiazide  25 mg Oral Daily  . levothyroxine  100 mcg Oral Q breakfast  . omega-3 acid ethyl esters  1 g Oral BID  . pantoprazole  40 mg Oral Daily  . simvastatin  10 mg Oral q1800  . sodium chloride  3 mL Intravenous Q12H   Continuous Infusions:   Principal Problem:   Transient global amnesia Active Problems:   Hypothyroidism   Essential hypertension   Occlusion and stenosis of basilar artery   Heart block AV second degree    Time spent: 25 minutes    Ah Bott  [facility] Pager [phone] If 7PM-7AM, please contact night-coverage at [email or web address], password [facility] 03/27/2014, 5:30 PM

## 2014-03-27 NOTE — Procedures (Signed)
EEG report.  Brief clinical history: 74 year old lady with multiple medical problems presenting with clinical findings consistent with transient global amnesia.  Technique: this is a 17 channel routine scalp EEG performed at the bedside with bipolar and monopolar montages arranged in accordance to the international 10/20 system of electrode placement. One channel was dedicated to EKG recording.  The study was performed during wakefulness, drowsiness, and stage 2 sleep. Intermittent photic stimulation was utilized as the sole activating procedure during the test.  Description:In the wakeful state, the best background consisted of a medium amplitude, posterior dominant, well sustained, symmetric and reactive 13 Hz rhythm. Drowsiness demonstrated dropout of the alpha rhythm. Stage 2 sleep showed symmetric and synchronous sleep spindles without intermixed epileptiform discharges. Intermittent photic stimulation did induce a normal driving response.  No focal or generalized epileptiform discharges noted.  No pathologic areas of slowing seen.  EKG showed sinus rhythm.  Impression: this is a normal awake and asleep EEG. Please, be aware that a normal EEG does not exclude the possibility of epilepsy.  Clinical correlation is advised.   Wyatt Portelasvaldo Nakeia Calvi, MD

## 2014-03-28 DIAGNOSIS — E039 Hypothyroidism, unspecified: Secondary | ICD-10-CM | POA: Diagnosis not present

## 2014-03-28 DIAGNOSIS — Z950 Presence of cardiac pacemaker: Secondary | ICD-10-CM | POA: Diagnosis not present

## 2014-03-28 DIAGNOSIS — I10 Essential (primary) hypertension: Secondary | ICD-10-CM | POA: Diagnosis not present

## 2014-03-28 DIAGNOSIS — M797 Fibromyalgia: Secondary | ICD-10-CM | POA: Diagnosis not present

## 2014-03-28 DIAGNOSIS — E669 Obesity, unspecified: Secondary | ICD-10-CM | POA: Diagnosis not present

## 2014-03-28 DIAGNOSIS — G454 Transient global amnesia: Secondary | ICD-10-CM | POA: Diagnosis not present

## 2014-03-28 DIAGNOSIS — Z8673 Personal history of transient ischemic attack (TIA), and cerebral infarction without residual deficits: Secondary | ICD-10-CM | POA: Diagnosis not present

## 2014-03-28 DIAGNOSIS — I2510 Atherosclerotic heart disease of native coronary artery without angina pectoris: Secondary | ICD-10-CM | POA: Diagnosis not present

## 2014-03-28 DIAGNOSIS — E785 Hyperlipidemia, unspecified: Secondary | ICD-10-CM | POA: Diagnosis not present

## 2014-03-28 LAB — HEMOGLOBIN A1C
HEMOGLOBIN A1C: 5.6 % (ref 4.8–5.6)
MEAN PLASMA GLUCOSE: 114 mg/dL

## 2014-03-28 NOTE — Progress Notes (Signed)
Patient discharge teaching given, including activity, diet, follow-up appoints, and medications. Patient verbalized understanding of all discharge instructions. IV access was d/c'd. Vitals are stable. Skin is intact except as charted in most recent assessments. Pt escorted out by RN, to be driven home by family.  

## 2014-03-28 NOTE — Discharge Summary (Signed)
Kelsey Dunn, is a 74 y.o. female  DOB 10-02-1940  MRN [id number].  Admission date:  03/26/2014  Admitting Physician  Eduard Clos, MD  Discharge Date:  03/28/2014   Primary MD  Crawford Givens, MD  Recommendations for primary care physician for things to follow:   Must follow with neurology in 1-2 weeks   Admission Diagnosis  Memory loss [R41.3] Confusion [R41.0] TGA (transient global amnesia) [G45.4]   Discharge Diagnosis  Memory loss [R41.3] Confusion [R41.0] TGA (transient global amnesia) [G45.4]    Principal Problem:   Transient global amnesia Active Problems:   Hypothyroidism   Essential hypertension   Occlusion and stenosis of basilar artery   Heart block AV second degree   Confusion      Past Medical History  Diagnosis Date  . Hypothyroidism 1990  . Labile hypertension 08/1991  . HLD (hyperlipidemia) 01/1997  . Fibromyalgia   . Bladder spasms   . Basilar artery stenosis     90%  . CAD (coronary artery disease)     a. 06/2009 cath: severe stenosis RCA with placement drug eluting stent;  b. 08/2011 cath: normal left main, luminal LAD irregularities, luminal LCx irregularities, luminal RCA irregularities and no ISR to mid-RCA DES, LVEF 60-65%; c. 02/2013 nonischemic CL, EF 79%.  . IC (interstitial cystitis)   . Obesity   . Hypertension   . Blood transfusion 1960's    w/childbirth  . CVA (cerebral infarction) December 2012  . Pacemaker 11/11/2011    dual chamber pacemaker  . Second degree Mobitz II AV block   . Family history of anesthesia complication     "first cousin had very fever and almost died" (09-Mar-2013)  . Malignant hyperthermia     "told to warn everybody before I had surgery that my 1st cousin had malignant hyperthermia; I've had symptoms of it too" (March 09, 2013)  .  Pneumonia     "twice" (2013/03/09)  . Stroke     "they tell me I've had some mini strokes; nothing that's bothered me though" (Mar 09, 2013)  . Arthritis     "fingers" (09-Mar-2013)    Past Surgical History  Procedure Laterality Date  . Bladder suspension  1974  . Cholecystectomy  1985  . Cardiac catheterization  1993    normal  . Renal angioplasty  1993    normal  . Carotid US  11/93    Occl L comm carotid  . Visual evoked response  11/93; 1/01    normal  . US renal/aorta  12/93    Normal  . Emg  1/01    LE, RUE normal  . Carotid US  4/98    L comm occl R-ok  . Carotid US  01/30/03    L occl  R less 40%  . Dexa  10/15/04    Troch -0.4 o/w pos  . Carotid arteriogram      bilat  . Bilateral vert & subclavian angiogram  07/19/08    85-90% stenosis mid basilar art; occluded left vert artery;  occluded left comm carotid artery w/ collat flow  . Basilar artery angioplasty  09/04/08    sm right brachial hematoma  . Cath single vess dz  06/13/09    70% stenosis RCA EF 65-70%  . Coronary angioplasty with stent placement  06/19/09  . Cataract extraction w/ intraocular lens implant Bilateral 2007-2008  . Eye surgery    . Vaginal hysterectomy  1974    dysmennorhea  . Insert / replace / remove pacemaker  11/11/2011    MDT Adapta L implanted by Dr Johney Frame. [employer]  . Left heart catheterization with coronary angiogram N/A 08/10/2011    Procedure: LEFT HEART CATHETERIZATION WITH CORONARY ANGIOGRAM;  Surgeon: Kathleene Hazel, MD;  Location: [facility] CATH LAB;  Service: Cardiovascular;  Laterality: N/A;  . Permanent pacemaker insertion N/A 11/11/2011    Procedure: PERMANENT PACEMAKER INSERTION;  Surgeon: Hillis Range, MD;  Location: [facility] CATH LAB;  Service: Cardiovascular;  Laterality: N/A;  . Left heart catheterization with coronary angiogram N/A 07/28/2013    Procedure: LEFT HEART CATHETERIZATION WITH CORONARY ANGIOGRAM;  Surgeon: Kathleene Hazel, MD;  Location: [facility] CATH LAB;  Service:  Cardiovascular;  Laterality: N/A;       History of present illness and  Hospital Course:     Kindly see H&P for history of present illness and admission details, please review complete Labs, Consult reports and Test reports for all details in brief  HPI  from the history and physical done on the day of admission   Kelsey Dunn is a 74 y.o. female with history of CAD status post stenting, high-grade AV block status post pacemaker placement, hypertension and hyperlipidemia was brought to the ER after patient was found to have sudden onset of loss of memory. Patient at around 11:45 AM was found to be confused and was not able to remember what happened since morning. In the ER patient was found to be nonfocal and was still confused. Patient was afebrile. UA and chest x-ray was unremarkable. Urine drug screen was negative. On-call neurologist Dr. Roseanne Reno was consulted and at this time is been admitted for further observation and management. As per the family patient has not been started on any new medications.    Hospital Course    1. Transient global amnesia. Seen by neurology, symptoms completely resolved, no focal deficits, does have history of left carotid occlusion which is total and had a repeat stable CT angiogram of head and neck recently. Follows with Dr. Corliss Skains on statin, aspirin and Plavix as secondary prevention. Case discussed with Dr. Roseanne Reno this morning's no further testing including no repeat echogram or carotid duplex. Discharge home with outpatient neurology follow-up. She had an EEG which was unremarkable as well. Her B12, TSH, A1c, lipid panel, HIV antibody and RPR were all unremarkable.   2.Hypothyroidism - TSH stable, continue home home dose Synthroid.   3.HTN - continue home meds.   4. Dyslipidemia. Discharge home on statin   Discharge Condition: Stable   Follow UP  Follow-up Information    Follow up with Crawford Givens, MD. Schedule an appointment as soon  as possible for a visit in 1 week.   Specialty:  Family Medicine   Contact information:   [address] [place] [address] [phone]       Follow up with [facility]. Schedule an appointment as soon as possible for a visit in 1 week.   Why:  Transient global Amnesia   Contact information:   [address]     [address] [place] [address] [phone]        Discharge Instructions  and  Discharge Medications      Discharge Instructions    Diet - low sodium heart healthy    Complete by:  As directed      Discharge instructions    Complete by:  As directed   Follow with Primary MD Crawford Givens, MD in 7 days   Get CBC, CMP, 2 view Chest X ray checked  by Primary MD next visit.    Activity: As tolerated with Full fall precautions use walker/cane & assistance as needed   Disposition Home    Diet: Heart Healthy .  For Heart failure patients - Check your Weight same time everyday, if you gain over 2 pounds, or you develop in leg swelling, experience more shortness of breath or chest pain, call your Primary MD immediately. Follow Cardiac Low Salt Diet and 1.5 lit/day fluid restriction.   On your next visit with your primary care physician please Get Medicines reviewed and adjusted.   Please request your Prim.MD to go over all Hospital Tests and Procedure/Radiological results at the follow up, please get all Hospital records sent to your Prim MD by signing hospital release before you go home.   If you experience worsening of your admission symptoms, develop shortness of breath, life threatening emergency, suicidal or homicidal thoughts you must seek medical attention immediately by calling 911 or calling your MD immediately  if symptoms less severe.  You Must read complete instructions/literature along with all the possible adverse reactions/side effects for all the Medicines you take and that have been prescribed to you.  Take any new Medicines after you have completely understood and accpet all the possible adverse reactions/side effects.   Do not drive, operating heavy machinery, perform activities at heights, swimming or participation in water activities or provide baby sitting services until you have seen by a Neurologist and advised to do so again.  Do not drive when taking Pain medications.    Do not take more than prescribed Pain, Sleep and Anxiety Medications  Special Instructions: If you have smoked or chewed Tobacco  in the last 2 yrs please stop smoking, stop any regular Alcohol  and or any Recreational drug use.  Wear Seat belts while driving.   Please note  You were cared for by a hospitalist during your hospital stay. If you have any questions about your discharge medications or the care you received while you were in the hospital after you are discharged, you can call the unit and asked to speak with the hospitalist on call if the hospitalist that took care of you is not available. Once you are discharged, your primary care physician will handle any further medical issues. Please note that NO REFILLS for any discharge medications will be authorized once you are discharged, as it is imperative that you return to your primary care physician (or establish a relationship with a primary care physician if you do not have one) for your aftercare needs so that they can reassess your need for medications and monitor your lab values.     Increase activity slowly    Complete by:  As directed             Medication List    TAKE these medications        amLODipine 5 MG tablet  Commonly known as:  NORVASC  TAKE 1 TABLET EVERY DAY  aspirin EC 81 MG tablet  Take 81 mg by mouth every morning.     AZO TABS PO  Take 1 tablet by mouth daily as needed (bladder spasms).     clopidogrel 75 MG tablet  Commonly known as:  PLAVIX  TAKE 1 TABLET BY MOUTH EVERY DAY     Coenzyme Q10 200 MG capsule    Take 200 mg by mouth 2 (two) times daily.     FISH OIL BURP-LESS 500 MG Caps  Take 1,000 mg by mouth 2 (two) times daily.     furosemide 40 MG tablet  Commonly known as:  LASIX  Take 40 mg by mouth daily as needed for edema.     lansoprazole 15 MG capsule  Commonly known as:  PREVACID  Take 15 mg by mouth as needed (reflux).     levothyroxine 100 MCG tablet  Commonly known as:  SYNTHROID, LEVOTHROID  TAKE 1 TABLET DAILY AS DIRECTED     losartan-hydrochlorothiazide 100-25 MG per tablet  Commonly known as:  HYZAAR  TAKE 1 TABLET BY MOUTH EVERY DAY     nitroGLYCERIN 0.4 MG SL tablet  Commonly known as:  NITROSTAT  Place 0.4 mg under the tongue every 5 (five) minutes as needed (MAX 3 TABLETS). For chest pain     simvastatin 10 MG tablet  Commonly known as:  ZOCOR  TAKE 1 TABLET BY MOUTH EVERY DAY AT 6PM          Diet and Activity recommendation: See Discharge Instructions above   Consults obtained - neurology   Major procedures and Radiology Reports - PLEASE review detailed and final reports for all details, in brief -       Ct Angio Head W/cm &/or Wo Cm  03/01/2014   CLINICAL DATA:  Basilar artery stenosis follow-up  EXAM: CT ANGIOGRAPHY HEAD AND NECK  TECHNIQUE: Multidetector CT imaging of the head and neck was performed using the standard protocol during bolus administration of intravenous contrast. Multiplanar CT image reconstructions and MIPs were obtained to evaluate the vascular anatomy. Carotid stenosis measurements (when applicable) are obtained utilizing NASCET criteria, using the distal internal carotid diameter as the denominator.  CONTRAST:  OMNIPAQUE IOHEXOL 350 MG/ML SOLN  COMPARISON:  CTA 01/16/2013  FINDINGS: CT HEAD  Brain: Ventricle size is normal. Negative for acute infarct. Negative for hemorrhage or mass.  Calvarium and skull base: Negative  Paranasal sinuses: Negative  Orbits: Negative for mass. Enlarged left ophthalmic artery likely due to  collateral flow from left common carotid artery occlusion.  CTA NECK  Aortic arch: Negative  Right carotid system: Right common carotid artery widely patent without stenosis. Right carotid bifurcation widely patent. Mild plaque in the carotid bulb without significant stenosis or dissection.  Left carotid system: Left common carotid artery remains occluded at the origin, unchanged. There is reconstitution of the internal carotid artery at the bifurcation due to external carotid artery collaterals. There is less flow in the left internal carotid artery on today's study compared to the prior study. This is likely due to progression of disease in the carotid bulb however could be due to timing of the scan with slow flow present due to collaterals. Left internal carotid artery is a very small artery due to low flow. The does appear smaller than on the prior CT.  Vertebral arteries:Left vertebral artery origin from the aortic arch which is a normal variant. Both vertebral arteries are patent to the basilar without stenosis.  Skeleton: Negative  Other neck: Negative for mass or adenopathy  CTA HEAD  Anterior circulation: Atherosclerotic calcification in the cavernous carotid bilaterally. Mild stenosis on the right. Small caliber left internal carotid artery due to low flow from common carotid artery occlusion as well as atherosclerotic disease in the cavernous segment. Anterior and middle cerebral arteries are patent bilaterally without stenosis.  Posterior circulation: Both vertebral arteries are patent to the basilar. Mild calcification distal left vertebral artery. PICA patent bilaterally. AICA patent bilaterally. Just above the AICA is a mild stenosis in the basilar which is unchanged. Superior cerebellar and posterior cerebral arteries patent bilaterally.  Negative for cerebral aneurysm.  Venous sinuses: Patent  Anatomic variants: As above  Delayed phase: No enhancing lesions.  IMPRESSION: Left common carotid artery  remains occluded unchanged. There is decreased caliber and flow in the left internal carotid artery which is likely due to progressive disease in the carotid bulb. Left internal carotid artery is supplied via external carotid collaterals.  Right carotid bifurcation widely patent  Both vertebral arteries widely patent.  Mild stenosis mid basilar unchanged.   Electronically Signed   By: Marlan Palauharles  Clark M.D.   On: 03/01/2014 15:37   Dg Chest 2 View  03/26/2014   CLINICAL DATA:  Confusion since 11:30 a.m.  EXAM: CHEST  2 VIEW  COMPARISON:  02/27/2013  FINDINGS: Dual-chamber ICD/pacer from the left with stable lead orientation. No cardiomegaly. No aortic or hilar contour abnormality. Minimal scarring or atelectasis at the left base. There is no edema, consolidation, effusion, or pneumothorax.  IMPRESSION: No active cardiopulmonary disease.   Electronically Signed   By: Marnee SpringJonathon  Watts M.D.   On: 03/26/2014 16:26   Ct Head Wo Contrast  03/26/2014   CLINICAL DATA:  Confusion.  Frequent urinary tract infections.  EXAM: CT HEAD WITHOUT CONTRAST  TECHNIQUE: Contiguous axial images were obtained from the base of the skull through the vertex without intravenous contrast.  COMPARISON:  03/01/2014  FINDINGS: Sinuses/Soft tissues: Hypoplastic frontal sinuses. Mild hyperostosis frontalis interna. Clear mastoid air cells.  Intracranial: Dense bilateral vertebral and left greater than right carotid atherosclerosis.  No mass lesion, hemorrhage, hydrocephalus, acute infarct, intra-axial, or extra-axial fluid collection.  IMPRESSION: 1.  No acute intracranial abnormality. 2. Intracranial atherosclerosis.   Electronically Signed   By: Jeronimo GreavesKyle  Talbot M.D.   On: 03/26/2014 15:50   Ct Angio Neck W/cm &/or Wo/cm  03/01/2014   CLINICAL DATA:  Basilar artery stenosis follow-up  EXAM: CT ANGIOGRAPHY HEAD AND NECK  TECHNIQUE: Multidetector CT imaging of the head and neck was performed using the standard protocol during bolus administration  of intravenous contrast. Multiplanar CT image reconstructions and MIPs were obtained to evaluate the vascular anatomy. Carotid stenosis measurements (when applicable) are obtained utilizing NASCET criteria, using the distal internal carotid diameter as the denominator.  CONTRAST:  100mL OMNIPAQUE IOHEXOL 350 MG/ML SOLN  COMPARISON:  CTA 01/16/2013  FINDINGS: CT HEAD  Brain: Ventricle size is normal. Negative for acute infarct. Negative for hemorrhage or mass.  Calvarium and skull base: Negative  Paranasal sinuses: Negative  Orbits: Negative for mass. Enlarged left ophthalmic artery likely due to collateral flow from left common carotid artery occlusion.  CTA NECK  Aortic arch: Negative  Right carotid system: Right common carotid artery widely patent without stenosis. Right carotid bifurcation widely patent. Mild plaque in the carotid bulb without significant stenosis or dissection.  Left carotid system: Left common carotid artery remains occluded at the origin, unchanged. There is reconstitution of the internal carotid  artery at the bifurcation due to external carotid artery collaterals. There is less flow in the left internal carotid artery on today's study compared to the prior study. This is likely due to progression of disease in the carotid bulb however could be due to timing of the scan with slow flow present due to collaterals. Left internal carotid artery is a very small artery due to low flow. The does appear smaller than on the prior CT.  Vertebral arteries:Left vertebral artery origin from the aortic arch which is a normal variant. Both vertebral arteries are patent to the basilar without stenosis.  Skeleton: Negative  Other neck: Negative for mass or adenopathy  CTA HEAD  Anterior circulation: Atherosclerotic calcification in the cavernous carotid bilaterally. Mild stenosis on the right. Small caliber left internal carotid artery due to low flow from common carotid artery occlusion as well as  atherosclerotic disease in the cavernous segment. Anterior and middle cerebral arteries are patent bilaterally without stenosis.  Posterior circulation: Both vertebral arteries are patent to the basilar. Mild calcification distal left vertebral artery. PICA patent bilaterally. AICA patent bilaterally. Just above the AICA is a mild stenosis in the basilar which is unchanged. Superior cerebellar and posterior cerebral arteries patent bilaterally.  Negative for cerebral aneurysm.  Venous sinuses: Patent  Anatomic variants: As above  Delayed phase: No enhancing lesions.  IMPRESSION: Left common carotid artery remains occluded unchanged. There is decreased caliber and flow in the left internal carotid artery which is likely due to progressive disease in the carotid bulb. Left internal carotid artery is supplied via external carotid collaterals.  Right carotid bifurcation widely patent  Both vertebral arteries widely patent.  Mild stenosis mid basilar unchanged.   Electronically Signed   By: Marlan Palau M.D.   On: 03/01/2014 15:37    Micro Results      No results found for this or any previous visit (from the past 240 hour(s)).     Today   Subjective:   Kelsey Dunn today has no headache,no chest abdominal pain,no new weakness tingling or numbness, feels much better wants to go home today.    Objective:   Blood pressure 135/61, pulse 61, temperature 98.2 F (36.8 C), temperature source Oral, resp. rate 18, height 4\' 11"  (1.499 m), weight 88.1 kg (194 lb 3.6 oz), SpO2 96 %.   Intake/Output Summary (Last 24 hours) at 03/28/14 0938 Last data filed at 03/28/14 0602  Gross per 24 hour  Intake    420 ml  Output   1300 ml  Net   -880 ml    Exam Awake Alert, Oriented x 3, No new F.N deficits, Normal affect [place].AT,PERRAL Supple Neck,No JVD, No cervical lymphadenopathy appriciated.  Symmetrical Chest wall movement, Good air movement bilaterally, CTAB RRR,No Gallops,Rubs or new Murmurs, No  Parasternal Heave +ve B.Sounds, Abd Soft, Non tender, No organomegaly appriciated, No rebound -guarding or rigidity. No Cyanosis, Clubbing or edema, No new Rash or bruise  Data Review   CBC w Diff: Lab Results  Component Value Date   WBC 6.5 03/27/2014   HGB 14.7 03/27/2014   HCT 43.7 03/27/2014   PLT 221 03/27/2014   LYMPHOPCT 30 03/27/2014   BANDSPCT DUPLICATE REQUEST 07/28/2013   MONOPCT 8 03/27/2014   EOSPCT 3 03/27/2014   BASOPCT 0 03/27/2014    CMP: Lab Results  Component Value Date   NA 141 03/27/2014   K 3.5 03/27/2014   CL 105 03/27/2014   CO2 25 03/27/2014   BUN  14 03/27/2014   CREATININE 0.92 03/27/2014   CREATININE 0.82 06/02/2011   PROT 6.4 03/27/2014   ALBUMIN 3.7 03/27/2014   BILITOT 0.8 03/27/2014   ALKPHOS 52 03/27/2014   AST 20 03/27/2014   ALT 18 03/27/2014  . Lab Results  Component Value Date   HGBA1C 5.6 03/26/2014    Lab Results  Component Value Date   CHOL 150 03/26/2014   HDL 42 03/26/2014   LDLCALC 77 03/26/2014   LDLDIRECT 116.5 08/07/2010   TRIG 154* 03/26/2014   CHOLHDL 3.6 03/26/2014    Lab Results  Component Value Date   TSH 1.406 03/26/2014   Results for Kelsey, Dunn (MRN [id number]) as of 03/28/2014 10:42  Ref. Range 03/26/2014 23:30  Folate Latest Units: ng/mL 19.7  Vitamin B-12 Latest Range: 211-911 pg/mL 345    Total Time in preparing paper work, data evaluation and todays exam - 35 minutes  Leroy Sea M.D on 03/28/2014 at 9:38 AM  [facility]   Office  [phone]

## 2014-03-28 NOTE — Discharge Instructions (Signed)
Follow with Primary MD Crawford GivensGraham [name], MD in 7 days   Get CBC, CMP, 2 view Chest X ray checked  by Primary MD next visit.    Activity: As tolerated with Full fall precautions use walker/cane & assistance as needed   Disposition Home **   Diet: Heart Healthy ** , with feeding assistance and aspiration precautions as needed.  For Heart failure patients - Check your Weight same time everyday, if you gain over 2 pounds, or you develop in leg swelling, experience more shortness of breath or chest pain, call your Primary MD immediately. Follow Cardiac Low Salt Diet and 1.5 lit/day fluid restriction.   On your next visit with your primary care physician please Get Medicines reviewed and adjusted.   Please request your Prim.MD to go over all Hospital Tests and Procedure/Radiological results at the follow up, please get all Hospital records sent to your Prim MD by signing hospital release before you go home.   If you experience worsening of your admission symptoms, develop shortness of breath, life threatening emergency, suicidal or homicidal thoughts you must seek medical attention immediately by calling 911 or calling your MD immediately  if symptoms less severe.  You Must read complete instructions/literature along with all the possible adverse reactions/side effects for all the Medicines you take and that have been prescribed to you. Take any new Medicines after you have completely understood and accpet all the possible adverse reactions/side effects.   Do not drive, operating heavy machinery, perform activities at heights, swimming or participation in water activities or provide baby sitting services until you have seen by a Neurologist and advised to do so again.  Do not drive when taking Pain medications.    Do not take more than prescribed Pain, Sleep and Anxiety Medications  Special Instructions: If you have smoked or chewed Tobacco  in the last 2 yrs please stop smoking, stop any  regular Alcohol  and or any Recreational drug use.  Wear Seat belts while driving.   Please note  You were cared for by a hospitalist during your hospital stay. If you have any questions about your discharge medications or the care you received while you were in the hospital after you are discharged, you can call the unit and asked to speak with the hospitalist on call if the hospitalist that took care of you is not available. Once you are discharged, your primary care physician will handle any further medical issues. Please note that NO REFILLS for any discharge medications will be authorized once you are discharged, as it is imperative that you return to your primary care physician (or establish a relationship with a primary care physician if you do not have one) for your aftercare needs so that they can reassess your need for medications and monitor your lab values.

## 2014-04-04 ENCOUNTER — Ambulatory Visit (INDEPENDENT_AMBULATORY_CARE_PROVIDER_SITE_OTHER): Payer: Commercial Managed Care - HMO | Admitting: Diagnostic Neuroimaging

## 2014-04-04 ENCOUNTER — Encounter: Payer: Self-pay | Admitting: Diagnostic Neuroimaging

## 2014-04-04 VITALS — BP 171/77 | HR 63 | Temp 98.0°F | Resp 18 | Ht 59.0 in | Wt 197.0 lb

## 2014-04-04 DIAGNOSIS — G454 Transient global amnesia: Secondary | ICD-10-CM

## 2014-04-04 NOTE — Progress Notes (Signed)
[facility]  PATIENT: Kelsey Dunn DOB: 1940-06-10  REFERRING CLINICIAN: Burney Gauze HISTORY FROM: patient  REASON FOR VISIT: new consult   HISTORICAL  CHIEF COMPLAINT:  Chief Complaint  Patient presents with  . Memory Loss    transient global amnesia; hospital discharge referral    HISTORY OF PRESENT ILLNESS:   74 year old right-handed female with hypertension, heart disease, pacemaker, left carotid occlusion, basilar artery stenosis, fibromyalgia, here for evaluation of transient global amnesia.  03/26/14, patient woke up normally, did her normal routine, but then lost large chunk of time and memory. Patient's husband was at home. He noticed that she was acting oddly, saying the same thing over and over including the phrases "help me", "what happened to me", "how did I get here".  Patient was taken to the emergency room and admitted for evaluation. TIA, stroke, seizure workup was completed. Patient was discharged on same medications.   Since that time she's had no further recurrent symptoms.  REVIEW/SUMMARY OF PRIOR NOTES (Dr. Modesto Charon, 05/29/11): June 2010 patient had episode when she lost her vision for a few minutes. She had drunk sensation and balance difficulty for a few days. She was found to have severe basilar artery stenosis and left common carotid artery occlusion. MRI did not show acute stroke at that time. In August 2010 she underwent basilar artery angioplasty. In December 2012 she had an episode of lightheadedness and possible acute, punctate, left frontal ischemic infarction, incidental.   REVIEW OF SYSTEMS: Full 14 system review of systems performed and notable only for fatigue palpitations easy bruising aching muscles or in his rales weakness snoring.  ALLERGIES: Allergies  Allergen Reactions  . Anesthetics, Amide Other (See Comments)    REACTION:FEVER, SYNCOPE  . Codeine Nausea And Vomiting    Reaction:Hallucinations  . Nitrofurantoin Nausea  Only and Other (See Comments)    REACTION: Hallucinations  . Orphenadrine Citrate Nausea And Vomiting and Other (See Comments)    REACTION: Hallucination  . Pentosan Polysulfate Sodium Rash and Other (See Comments)    REACTION: rash/ severe headache    HOME MEDICATIONS: Outpatient Prescriptions Prior to Visit  Medication Sig Dispense Refill  . amLODipine (NORVASC) 5 MG tablet TAKE 1 TABLET EVERY DAY 90 tablet 1  . aspirin EC 81 MG tablet Take 81 mg by mouth every morning.    . clopidogrel (PLAVIX) 75 MG tablet TAKE 1 TABLET BY MOUTH EVERY DAY 90 tablet 1  . Coenzyme Q10 200 MG capsule Take 200 mg by mouth 2 (two) times daily.    . furosemide (LASIX) 40 MG tablet Take 40 mg by mouth daily as needed for edema.    . lansoprazole (PREVACID) 15 MG capsule Take 15 mg by mouth as needed (reflux).     Marland Kitchen levothyroxine (SYNTHROID, LEVOTHROID) 100 MCG tablet TAKE 1 TABLET DAILY AS DIRECTED 90 tablet 1  . losartan-hydrochlorothiazide (HYZAAR) 100-25 MG per tablet TAKE 1 TABLET BY MOUTH EVERY DAY 90 tablet 1  . nitroGLYCERIN (NITROSTAT) 0.4 MG SL tablet Place 0.4 mg under the tongue every 5 (five) minutes as needed (MAX 3 TABLETS). For chest pain    . Omega-3 Fatty Acids (FISH OIL BURP-LESS) 500 MG CAPS Take 1,000 mg by mouth 2 (two) times daily.     . Phenazopyridine HCl (AZO TABS PO) Take 1 tablet by mouth daily as needed (bladder spasms).     . simvastatin (ZOCOR) 10 MG tablet TAKE 1 TABLET BY MOUTH EVERY DAY AT 6PM 90 tablet 1  No facility-administered medications prior to visit.    PAST MEDICAL HISTORY: Past Medical History  Diagnosis Date  . Hypothyroidism 1990  . Labile hypertension 08/1991  . HLD (hyperlipidemia) 01/1997  . Fibromyalgia   . Bladder spasms   . Basilar artery stenosis     90%  . CAD (coronary artery disease)     a. 06/2009 cath: severe stenosis RCA with placement drug eluting stent;  b. 08/2011 cath: normal left main, luminal LAD irregularities, luminal LCx  irregularities, luminal RCA irregularities and no ISR to mid-RCA DES, LVEF 60-65%; c. 02/2013 nonischemic CL, EF 79%.  . IC (interstitial cystitis)   . Obesity   . Hypertension   . Blood transfusion 1960's    w/childbirth  . CVA (cerebral infarction) December 2012  . Pacemaker 11/11/2011    dual chamber pacemaker  . Second degree Mobitz II AV block   . Family history of anesthesia complication     "first cousin had very fever and almost died" (23-Mar-2013)  . Malignant hyperthermia     "told to warn everybody before I had surgery that my 1st cousin had malignant hyperthermia; I've had symptoms of it too" (03/23/13)  . Pneumonia     "twice" (03-23-13)  . Stroke     "they tell me I've had some mini strokes; nothing that's bothered me though" (March 23, 2013)  . Arthritis     "fingers" (03-23-2013)  . Left-sided carotid artery obstruction     PAST SURGICAL HISTORY: Past Surgical History  Procedure Laterality Date  . Bladder suspension  1974  . Cholecystectomy  1985  . Cardiac catheterization  1993    normal  . Renal angioplasty  1993    normal  . Carotid US  11/93    Occl L comm carotid  . Visual evoked response  11/93; 1/01    normal  . US renal/aorta  12/93    Normal  . Emg  1/01    LE, RUE normal  . Carotid US  4/98    L comm occl R-ok  . Carotid US  01/30/03    L occl  R less 40%  . Dexa  10/15/04    Troch -0.4 o/w pos  . Carotid arteriogram      bilat  . Bilateral vert & subclavian angiogram  07/19/08    85-90% stenosis mid basilar art; occluded left vert artery; occluded left comm carotid artery w/ collat flow  . Basilar artery angioplasty  09/04/08    sm right brachial hematoma  . Cath single vess dz  06/13/09    70% stenosis RCA EF 65-70%  . Coronary angioplasty with stent placement  06/19/09  . Cataract extraction w/ intraocular lens implant Bilateral 2007-2008  . Eye surgery    . Vaginal hysterectomy  1974    dysmennorhea  . Insert / replace / remove pacemaker   11/11/2011    MDT Adapta L implanted by Dr Johney Frame. [employer]  . Left heart catheterization with coronary angiogram N/A 08/10/2011    Procedure: LEFT HEART CATHETERIZATION WITH CORONARY ANGIOGRAM;  Surgeon: Kathleene Hazel, MD;  Location: [facility] CATH LAB;  Service: Cardiovascular;  Laterality: N/A;  . Permanent pacemaker insertion N/A 11/11/2011    Procedure: PERMANENT PACEMAKER INSERTION;  Surgeon: Hillis Range, MD;  Location: [facility] CATH LAB;  Service: Cardiovascular;  Laterality: N/A;  . Left heart catheterization with coronary angiogram N/A 07/28/2013    Procedure: LEFT HEART CATHETERIZATION WITH CORONARY ANGIOGRAM;  Surgeon: Kathleene Hazel, MD;  Location: [facility]  CATH LAB;  Service: Cardiovascular;  Laterality: N/A;    FAMILY HISTORY: Family History  Problem Relation Age of Onset  . Lung cancer Father   . Heart attack Mother   . Lymphoma Mother     s/p chemo  . Stroke Mother     x 2  . Diabetes Brother   . Colon cancer Neg Hx   . Breast cancer Maternal Aunt     SOCIAL HISTORY:  History   Social History  . Marital Status: Married    Spouse Name: N/A  . Number of Children: 1  . Years of Education: N/A   Occupational History  . Bookkeeper-part time   . retired    Social History Main Topics  . Smoking status: Never Smoker   . Smokeless tobacco: Never Used  . Alcohol Use: No  . Drug Use: No  . Sexual Activity: Not Currently    Birth Control/ Protection: Post-menopausal   Other Topics Concern  . 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  No exam data present  No flowsheet data found.  GENERAL  EXAM: Patient is in no distress; well developed, nourished and groomed; neck is supple  CARDIOVASCULAR: Regular rate and rhythm, no murmurs, no carotid bruits  NEUROLOGIC: MENTAL STATUS: awake, alert, oriented to person, place and time, recent and remote memory intact, normal attention and concentration, language fluent, comprehension intact, naming intact, fund of knowledge appropriate CRANIAL NERVE: no papilledema on fundoscopic exam, pupils equal and reactive to light, visual fields full to confrontation, extraocular muscles intact, no nystagmus, facial sensation and strength symmetric, hearing intact, palate elevates symmetrically, uvula midline, shoulder shrug symmetric, tongue midline. MOTOR: normal bulk and tone, full strength in the BUE, BLE SENSORY: normal and symmetric to light touch, pinprick, temperature, vibration  COORDINATION: finger-nose-finger, fine finger movements normal REFLEXES: deep tendon reflexes present and symmetric GAIT/STATION: narrow based gait; SLIGHT LIMPING GAITable to walk tandem; romberg is negative    DIAGNOSTIC DATA (LABS, IMAGING, TESTING) - I reviewed patient records, labs, notes, testing and imaging myself where available.  Lab Results  Component Value Date   WBC 6.5 03/27/2014   HGB 14.7 03/27/2014   HCT 43.7 03/27/2014   MCV 88.5 03/27/2014   PLT 221 03/27/2014      Component Value Date/Time   NA 141 03/27/2014 0640   K 3.5 03/27/2014 0640   CL 105 03/27/2014 0640   CO2 25 03/27/2014 0640   GLUCOSE 102* 03/27/2014 0640   BUN 14 03/27/2014 0640   CREATININE 0.92 03/27/2014 0640   CREATININE 0.82 06/02/2011 1106   CALCIUM 9.3 03/27/2014 0640   PROT 6.4 03/27/2014 0640   ALBUMIN 3.7 03/27/2014 0640   AST 20 03/27/2014 0640   ALT 18 03/27/2014 0640   ALKPHOS 52 03/27/2014 0640   BILITOT 0.8 03/27/2014 0640   GFRNONAA 61* 03/27/2014 0640   GFRAA 71* 03/27/2014 0640   Lab Results  Component Value Date   CHOL 150 03/26/2014   HDL 42  03/26/2014   LDLCALC 77 03/26/2014  LDLDIRECT 116.5 08/07/2010   TRIG 154* 03/26/2014   CHOLHDL 3.6 03/26/2014   Lab Results  Component Value Date   HGBA1C 5.6 03/26/2014   Lab Results  Component Value Date   VITAMINB12 345 03/26/2014   Lab Results  Component Value Date   TSH 1.406 03/26/2014    03/26/14 CT head [I reviewed images myself and agree with interpretation. -VRP]  1. No acute intracranial abnormality. 2. Intracranial atherosclerosis.  03/01/14 CTA head / neck [I reviewed images myself and agree with interpretation. -VRP]  - Left common carotid artery remains occluded unchanged. There is decreased caliber and flow in the left internal carotid artery which is likely due to progressive disease in the carotid bulb. Left internal carotid artery is supplied via external carotid collaterals. Right carotid bifurcation widely patent. Both vertebral arteries widely patent. Mild stenosis mid basilar unchanged.  02/21/14 TTE  - Left ventricle: The cavity size was normal. There was mild focal basal hypertrophy of the septum. Systolic function was normal. The estimated ejection fraction was in the range of 60% to 65%.Wall motion was normal; there were no regional wall motion abnormalities. - Aortic valve: Moderately calcified annulus. Trileaflet. Mild thickening and calcification, consistent with sclerosis. - Right ventricle: Pacer wire or catheter noted in right ventricle. - Right atrium: Pacer wire or catheter noted in right atrium. - Tricuspid valve: There was mild regurgitation. - Pulmonic valve: There was trivial regurgitation.  03/27/14 EEG - normal awake and asleep    ASSESSMENT AND PLAN  74 y.o. year old female here with transient global amnesia event, with typical symptoms, full resolution, and underlying vascular risk factors. TIA stroke and seizure workup completed. Agree with medical management of stroke risk factors (hypertension) and continue aspirin and plavix. She  has has accelerated atherosclerotic disease affect her brain, neck and heart, mainly due to uncontrolled hypertension.   Dx: transient global amnesia  PLAN: - continue curent medications - follow up with PCP for BP control  Return if symptoms worsen or fail to improve, for return to PCP.    Suanne Marker, MD 04/04/2014, 11:23 AM Certified in Neurology, Neurophysiology and Neuroimaging  [facility] [address] [place], [place] [address] [phone]

## 2014-04-05 ENCOUNTER — Encounter: Payer: Self-pay | Admitting: Family Medicine

## 2014-04-05 ENCOUNTER — Ambulatory Visit (INDEPENDENT_AMBULATORY_CARE_PROVIDER_SITE_OTHER): Payer: Commercial Managed Care - HMO | Admitting: Family Medicine

## 2014-04-05 VITALS — BP 130/76 | HR 84 | Temp 98.0°F | Wt 197.8 lb

## 2014-04-05 DIAGNOSIS — E7800 Pure hypercholesterolemia, unspecified: Secondary | ICD-10-CM

## 2014-04-05 DIAGNOSIS — G454 Transient global amnesia: Secondary | ICD-10-CM | POA: Diagnosis not present

## 2014-04-05 NOTE — Progress Notes (Signed)
Pre visit review using our clinic review tool, if applicable. No additional management support is needed unless otherwise documented below in the visit note.  She had more diffuse muscle aches and didn't know if simvastatin was contributing.  She wanted to stop it.  Discussed.  She has been on it for a prolonged period.  Aching in the R arm esp but also , B legs and trunk.    Admitted with transient global amnesia.  Was at home, talking but not making sense, admitted and extensive w/u was neg.  Reviewed and dw pt.  She saw neuro yesterday, note reviewed.  Husband drove her today.  No with no neuro sx.  No complaints other than muscle aches.    Meds, vitals, and allergies reviewed.   ROS: See HPI.  Otherwise, noncontributory.  GEN: nad, alert and oriented HEENT: mucous membranes moist NECK: supple w/o LA CV: rrr. PULM: ctab, no inc wob ABD: soft, +bs EXT: no edema SKIN: no acute rash Large muscle groups diffusely ttp on exam.

## 2014-04-05 NOTE — Patient Instructions (Addendum)
Stop the simvastatin and update me in 1-2 weeks, sooner if needed.   Take care.  Glad to see you.

## 2014-04-06 NOTE — Assessment & Plan Note (Signed)
Now resolved. No sx.  Likely dx.  dw pt. No other intervention at this point.

## 2014-04-06 NOTE — Assessment & Plan Note (Addendum)
Stop statin to see if the aches improve, she'll update me.  BP okay today.   We can try a different statin if she has improvement in the aches off simva.  >25 minutes spent in face to face time with patient, >50% spent in counselling or coordination of care.

## 2014-04-16 ENCOUNTER — Ambulatory Visit (INDEPENDENT_AMBULATORY_CARE_PROVIDER_SITE_OTHER): Payer: Commercial Managed Care - HMO | Admitting: *Deleted

## 2014-04-16 DIAGNOSIS — I441 Atrioventricular block, second degree: Secondary | ICD-10-CM | POA: Diagnosis not present

## 2014-04-16 LAB — MDC_IDC_ENUM_SESS_TYPE_REMOTE
Battery Impedance: 135 Ohm
Battery Voltage: 2.79 V
Brady Statistic AP VP Percent: 0 %
Brady Statistic AS VS Percent: 66 %
Lead Channel Impedance Value: 423 Ohm
Lead Channel Impedance Value: 576 Ohm
Lead Channel Pacing Threshold Amplitude: 1.125 V
Lead Channel Pacing Threshold Pulse Width: 0.4 ms
Lead Channel Sensing Intrinsic Amplitude: 2.8 mV
Lead Channel Setting Pacing Amplitude: 2 V
Lead Channel Setting Sensing Sensitivity: 2 mV
MDC IDC MSMT BATTERY REMAINING LONGEVITY: 146 mo
MDC IDC MSMT LEADCHNL RA PACING THRESHOLD AMPLITUDE: 0.5 V
MDC IDC MSMT LEADCHNL RA PACING THRESHOLD PULSEWIDTH: 0.4 ms
MDC IDC MSMT LEADCHNL RV SENSING INTR AMPL: 11.2 mV
MDC IDC SESS DTM: 20160411112342
MDC IDC SET LEADCHNL RV PACING AMPLITUDE: 2.5 V
MDC IDC SET LEADCHNL RV PACING PULSEWIDTH: 0.4 ms
MDC IDC STAT BRADY AP VS PERCENT: 32 %
MDC IDC STAT BRADY AS VP PERCENT: 2 %

## 2014-04-16 NOTE — Progress Notes (Signed)
Remote pacemaker transmission.   

## 2014-04-20 ENCOUNTER — Telehealth: Payer: Self-pay

## 2014-04-20 NOTE — Telephone Encounter (Signed)
Pt left v/m; pt seen 04/05/14 and stopped simvastatin; pt said achiness is much better since off simvastatin; pt is not sure she wants to go back on cholesterol med. Pt request cb from Dr Para Marchuncan when available.

## 2014-04-21 NOTE — Telephone Encounter (Signed)
Glad she is better.  I think the simvastatin was causing some of the aches.  Would be reasonable to try pravastatin instead.  It is less likely to cause the aches.  I would give that a try.  If more aches, then stop it.  If she'll try it, then please send rx for pravastatin 10mg  a day, #90, 3rf.  Thanks.   Added simvastatin to intolerance list.

## 2014-04-23 ENCOUNTER — Encounter: Payer: Self-pay | Admitting: Cardiology

## 2014-04-23 MED ORDER — PRAVASTATIN SODIUM 10 MG PO TABS: 10.0000 mg | ORAL_TABLET | Freq: Every day | ORAL | Status: DC

## 2014-04-23 NOTE — Telephone Encounter (Signed)
Patient advised. Rx sent to pharmacy. 

## 2014-04-24 ENCOUNTER — Encounter: Payer: Self-pay | Admitting: Internal Medicine

## 2014-05-10 ENCOUNTER — Other Ambulatory Visit: Payer: Self-pay | Admitting: Family Medicine

## 2014-05-10 ENCOUNTER — Other Ambulatory Visit (HOSPITAL_COMMUNITY): Payer: Self-pay | Admitting: Interventional Radiology

## 2014-05-10 ENCOUNTER — Telehealth (HOSPITAL_COMMUNITY): Payer: Self-pay | Admitting: Interventional Radiology

## 2014-05-10 DIAGNOSIS — I771 Stricture of artery: Secondary | ICD-10-CM

## 2014-05-10 NOTE — Telephone Encounter (Signed)
Pt would like to come in for a consult. She has not been seen in clinic since 2013. She has questions and concerns. JM

## 2014-05-15 ENCOUNTER — Ambulatory Visit (HOSPITAL_COMMUNITY)
Admission: RE | Admit: 2014-05-15 | Discharge: 2014-05-15 | Disposition: A | Discharge status: Home / Self Care | Payer: Commercial Managed Care - HMO | Source: Ambulatory Visit | Attending: Interventional Radiology | Admitting: Interventional Radiology

## 2014-05-15 DIAGNOSIS — I6522 Occlusion and stenosis of left carotid artery: Secondary | ICD-10-CM | POA: Diagnosis not present

## 2014-05-15 DIAGNOSIS — I771 Stricture of artery: Secondary | ICD-10-CM

## 2014-06-17 ENCOUNTER — Other Ambulatory Visit: Payer: Self-pay | Admitting: Family Medicine

## 2014-07-17 ENCOUNTER — Ambulatory Visit (INDEPENDENT_AMBULATORY_CARE_PROVIDER_SITE_OTHER): Payer: Commercial Managed Care - HMO | Admitting: *Deleted

## 2014-07-17 DIAGNOSIS — I441 Atrioventricular block, second degree: Secondary | ICD-10-CM | POA: Diagnosis not present

## 2014-07-17 NOTE — Progress Notes (Signed)
Remote pacemaker transmission.   

## 2014-07-18 LAB — CUP PACEART REMOTE DEVICE CHECK
Battery Impedance: 158 Ohm
Battery Voltage: 2.79 V
Brady Statistic AP VP Percent: 0 %
Brady Statistic AP VS Percent: 31 %
Brady Statistic AS VP Percent: 3 %
Brady Statistic AS VS Percent: 65 %
Date Time Interrogation Session: 20160712142816
Lead Channel Impedance Value: 589 Ohm
Lead Channel Pacing Threshold Amplitude: 1.125 V
Lead Channel Pacing Threshold Pulse Width: 0.4 ms
Lead Channel Sensing Intrinsic Amplitude: 2.58 mV
Lead Channel Setting Pacing Amplitude: 2 V
Lead Channel Setting Pacing Pulse Width: 0.4 ms
MDC IDC MSMT BATTERY REMAINING LONGEVITY: 140 mo
MDC IDC MSMT LEADCHNL RA IMPEDANCE VALUE: 430 Ohm
MDC IDC MSMT LEADCHNL RA PACING THRESHOLD AMPLITUDE: 0.5 V
MDC IDC MSMT LEADCHNL RA PACING THRESHOLD PULSEWIDTH: 0.4 ms
MDC IDC MSMT LEADCHNL RV SENSING INTR AMPL: 11.2 mV
MDC IDC SET LEADCHNL RV PACING AMPLITUDE: 2.5 V
MDC IDC SET LEADCHNL RV SENSING SENSITIVITY: 2.8 mV

## 2014-08-10 ENCOUNTER — Encounter: Payer: Self-pay | Admitting: Cardiology

## 2014-08-12 ENCOUNTER — Other Ambulatory Visit: Payer: Self-pay | Admitting: Family Medicine

## 2014-08-14 ENCOUNTER — Encounter: Payer: Self-pay | Admitting: Internal Medicine

## 2014-10-01 ENCOUNTER — Encounter: Payer: Self-pay | Admitting: Family Medicine

## 2014-10-01 ENCOUNTER — Ambulatory Visit (INDEPENDENT_AMBULATORY_CARE_PROVIDER_SITE_OTHER): Payer: Commercial Managed Care - HMO | Admitting: Family Medicine

## 2014-10-01 VITALS — BP 136/66 | HR 68 | Temp 98.0°F | Wt 195.5 lb

## 2014-10-01 DIAGNOSIS — I10 Essential (primary) hypertension: Secondary | ICD-10-CM

## 2014-10-01 DIAGNOSIS — M542 Cervicalgia: Secondary | ICD-10-CM

## 2014-10-01 DIAGNOSIS — E7800 Pure hypercholesterolemia, unspecified: Secondary | ICD-10-CM

## 2014-10-01 LAB — BASIC METABOLIC PANEL
BUN: 19 mg/dL (ref 6–23)
CALCIUM: 10.2 mg/dL (ref 8.4–10.5)
CHLORIDE: 101 meq/L (ref 96–112)
CO2: 30 meq/L (ref 19–32)
Creatinine, Ser: 0.84 mg/dL (ref 0.40–1.20)
GFR: 70.85 mL/min (ref >60.00)
GLUCOSE: 91 mg/dL (ref 70–99)
POTASSIUM: 3.7 meq/L (ref 3.5–5.1)
SODIUM: 140 meq/L (ref 135–145)

## 2014-10-01 LAB — SEDIMENTATION RATE: Sed Rate: 8 mm/hr (ref 0–22)

## 2014-10-01 LAB — LDL CHOLESTEROL, DIRECT: LDL DIRECT: 145 mg/dL

## 2014-10-01 NOTE — Assessment & Plan Note (Signed)
Recheck dLDL off statin.

## 2014-10-01 NOTE — Progress Notes (Signed)
BP 136/66 mmHg  Pulse 68  Temp(Src) 98 F (36.7 C) (Oral)  Wt 195 lb 8 oz (88.678 kg)   CC: R earache /neck pain Subjective:    Patient ID: JAQUELIN MEANEY, female    DOB: [birth year], 74 y.o.   MRN: [id number]  HPI: JERYN BERTONI is a 74 y.o. female presenting on 10/01/2014 for Otalgia   2d h/o R neck and earache, some R temple pain and right sided headaches. Some dizziness with head turns as well. No hearing changes, nausea/vomiting.   No fevers/chills, sore throat, cough, congestion. No visual changes.   Known R carotid stenosis and endorses some R posterior circulation blockage (basal artery) as well as CAD with stent and CVA. On aspirin and plavix. Has pacemaker in place. Followed by Dr Bronson Curb Q20mowith yearly CT scan. Off statins 2/2 myalgias.   Relevant past medical, surgical, family and social history reviewed and updated as indicated. Interim medical history since our last visit reviewed. Allergies and medications reviewed and updated. Current Outpatient Prescriptions on File Prior to Visit  Medication Sig  . amLODipine (NORVASC) 5 MG tablet TAKE 1 TABLET BY MOUTH EVERY DAY  . aspirin EC 81 MG tablet Take 81 mg by mouth every morning.  . clopidogrel (PLAVIX) 75 MG tablet TAKE 1 TABLET BY MOUTH EVERY DAY  . Coenzyme Q10 200 MG capsule Take 200 mg by mouth 2 (two) times daily.  . furosemide (LASIX) 40 MG tablet Take 40 mg by mouth daily as needed for edema.  . lansoprazole (PREVACID) 15 MG capsule Take 15 mg by mouth as needed (reflux).   .Marland Kitchenlevothyroxine (SYNTHROID, LEVOTHROID) 100 MCG tablet TAKE 1 TABLET BY MOUTH EVERY DAY AS DIRECTED  . losartan-hydrochlorothiazide (HYZAAR) 100-25 MG per tablet TAKE 1 TABLET BY MOUTH EVERY DAY  . nitroGLYCERIN (NITROSTAT) 0.4 MG SL tablet Place 0.4 mg under the tongue every 5 (five) minutes as needed (MAX 3 TABLETS). For chest pain  . Omega-3 Fatty Acids (FISH OIL BURP-LESS) 500 MG CAPS Take 1,000 mg by mouth 2 (two) times daily.   .  Phenazopyridine HCl (AZO TABS PO) Take 1 tablet by mouth daily as needed (bladder spasms).    No current facility-administered medications on file prior to visit.    Review of Systems Per HPI unless specifically indicated above     Objective:    BP 136/66 mmHg  Pulse 68  Temp(Src) 98 F (36.7 C) (Oral)  Wt 195 lb 8 oz (88.678 kg)  Wt Readings from Last 3 Encounters:  10/01/14 195 lb 8 oz (88.678 kg)  04/05/14 197 lb 12 oz (89.699 kg)  04/04/14 197 lb (89.359 kg)    Physical Exam  Constitutional: She is oriented to person, place, and time. She appears well-developed and well-nourished. No distress.  HENT:  Head: Normocephalic and atraumatic.  Right Ear: Hearing, tympanic membrane, external ear and ear canal normal.  Left Ear: Hearing, tympanic membrane, external ear and ear canal normal.  Nose: Nose normal. No mucosal edema or rhinorrhea. Right sinus exhibits no maxillary sinus tenderness and no frontal sinus tenderness. Left sinus exhibits no maxillary sinus tenderness and no frontal sinus tenderness.  Mouth/Throat: Uvula is midline, oropharynx is clear and moist and mucous membranes are normal. No oropharyngeal exudate, posterior oropharyngeal edema, posterior oropharyngeal erythema or tonsillar abscesses.  No temporal bruit. Mild discomfort to palpation of right temporal artery, but discomfort also present elsewhere Upper dental plate, no pain to palpation of mandible, R lower molars/premolars absent  Eyes: Conjunctivae and EOM are normal. Pupils are equal, round, and reactive to light. No scleral icterus.  Neck: Normal range of motion. Neck supple. Carotid bruit is not present (difficult to distinguish in h/o SEM). No thyromegaly present.  FROM at neck but some tenderness/tightness noted to R rotation and lateral flexion No midline cervical neck pain, no trap mm tenderness or paracervical mm tenderness  Cardiovascular: Normal rate, regular rhythm and intact distal pulses.     Murmur (3/6 SEM best at upper sternal border) heard. Pulmonary/Chest: Effort normal and breath sounds normal. No respiratory distress. She has no wheezes. She has no rales.  Musculoskeletal: She exhibits no edema.  Lymphadenopathy:       Head (right side): No submental, no submandibular, no tonsillar, no preauricular, no posterior auricular and no occipital adenopathy present.       Head (left side): No submental, no submandibular, no tonsillar, no preauricular, no posterior auricular and no occipital adenopathy present.    She has no cervical adenopathy.  Neurological: She is alert and oriented to person, place, and time. Coordination normal.  Dizziness not reproduced on exam. No vertigo or nystagmus  Skin: Skin is warm and dry. No rash noted.  Psychiatric: She has a normal mood and affect.  Nursing note and vitals reviewed.     Assessment & Plan:   Problem List Items Addressed This Visit    Pure hypercholesterolemia    Recheck dLDL off statin.      Relevant Orders   LDL Cholesterol, Direct   Neck pain on right side - Primary    Along with R earache, R jaw discomfort, mild R temple pain and headache. No significant bruit appreciated. Anticipate pulled cervical or trapezius muscle. rec supportive care with heating pad, gentle stretches, tylenol PRN. Discussed if no improvement rec eval with Dr Estanislado Pandy. Check ESR to eval for GCA given age and mild discomfort to palpation o temporal artery, but doubt this is cause of sxs. Pt agrees with plan.      Relevant Orders   Sedimentation rate   Essential hypertension   Relevant Orders   Basic metabolic panel       Follow up plan: Return if symptoms worsen or fail to improve, for follow up visit.

## 2014-10-01 NOTE — Progress Notes (Signed)
Pre visit review using our clinic review tool, if applicable. No additional management support is needed unless otherwise documented below in the visit note. 

## 2014-10-01 NOTE — Assessment & Plan Note (Addendum)
Along with R earache, R jaw discomfort, mild R temple pain and headache. No significant bruit appreciated. Anticipate pulled cervical or trapezius muscle. rec supportive care with heating pad, gentle stretches, tylenol PRN. Discussed if no improvement rec eval with Dr Estanislado Pandy. Check ESR to eval for GCA given age and mild discomfort to palpation o temporal artery, but doubt this is cause of sxs. Pt agrees with plan.

## 2014-10-01 NOTE — Patient Instructions (Signed)
Blood work today. We will check for temporal arteritis with blood work today. This is possible muscle strain - treat with heating pad to neck and gentle stretching. If no better, let us know or call to see Dr Corliss Skains.  Nice to see you today, call us with questions.

## 2014-10-02 ENCOUNTER — Telehealth: Payer: Self-pay | Admitting: *Deleted

## 2014-10-02 ENCOUNTER — Other Ambulatory Visit: Payer: Self-pay | Admitting: *Deleted

## 2014-10-02 NOTE — Telephone Encounter (Signed)
Med list updated

## 2014-10-12 ENCOUNTER — Ambulatory Visit (INDEPENDENT_AMBULATORY_CARE_PROVIDER_SITE_OTHER): Payer: Commercial Managed Care - HMO

## 2014-10-12 DIAGNOSIS — Z23 Encounter for immunization: Secondary | ICD-10-CM | POA: Diagnosis not present

## 2014-10-15 ENCOUNTER — Encounter: Payer: Commercial Managed Care - HMO | Admitting: Internal Medicine

## 2014-10-22 ENCOUNTER — Encounter: Payer: Self-pay | Admitting: Internal Medicine

## 2014-10-22 ENCOUNTER — Ambulatory Visit (INDEPENDENT_AMBULATORY_CARE_PROVIDER_SITE_OTHER): Payer: Commercial Managed Care - HMO | Admitting: Internal Medicine

## 2014-10-22 VITALS — BP 134/70 | HR 78 | Ht 59.0 in | Wt 197.0 lb

## 2014-10-22 DIAGNOSIS — Z950 Presence of cardiac pacemaker: Secondary | ICD-10-CM | POA: Diagnosis not present

## 2014-10-22 DIAGNOSIS — I441 Atrioventricular block, second degree: Secondary | ICD-10-CM

## 2014-10-22 LAB — CUP PACEART INCLINIC DEVICE CHECK
Battery Impedance: 158 Ohm
Battery Remaining Longevity: 140 mo
Battery Voltage: 2.79 V
Brady Statistic AP VP Percent: 0 %
Brady Statistic AP VS Percent: 31 %
Brady Statistic AS VP Percent: 4 %
Brady Statistic AS VS Percent: 66 %
Date Time Interrogation Session: 20161017151938
Implantable Lead Implant Date: 20131106
Implantable Lead Implant Date: 20131106
Implantable Lead Location: 753859
Implantable Lead Location: 753860
Implantable Lead Model: 5076
Implantable Lead Model: 5092
Lead Channel Impedance Value: 430 Ohm
Lead Channel Impedance Value: 562 Ohm
Lead Channel Pacing Threshold Amplitude: 0.5 V
Lead Channel Pacing Threshold Amplitude: 0.5 V
Lead Channel Pacing Threshold Amplitude: 1 V
Lead Channel Pacing Threshold Amplitude: 1.125 V
Lead Channel Pacing Threshold Pulse Width: 0.4 ms
Lead Channel Pacing Threshold Pulse Width: 0.4 ms
Lead Channel Pacing Threshold Pulse Width: 0.4 ms
Lead Channel Pacing Threshold Pulse Width: 0.4 ms
Lead Channel Sensing Intrinsic Amplitude: 4 mV
Lead Channel Sensing Intrinsic Amplitude: 5.6 mV
Lead Channel Setting Pacing Amplitude: 2 V
Lead Channel Setting Pacing Amplitude: 2.5 V
Lead Channel Setting Pacing Pulse Width: 0.4 ms
Lead Channel Setting Sensing Sensitivity: 2 mV
Zone Setting Detection Interval: 400 ms
Zone Setting Detection Interval: 400 ms

## 2014-10-22 NOTE — Progress Notes (Signed)
PCP: Crawford GivensGraham [name], MD Primary Cardiologist:  Dr Brendia SacksMcAlhany  Kelsey Dunn is a 74 y.o. female who presents today for routine electrophysiology followup.  Since last being seen in our clinic, the patient reports doing reasonably well.  He primary concern remains with her daughter who has been diagnosed with both MS and breast cancer and her husbands recent ongoing with SOB.  Today, she denies symptoms of palpitations, chest pain, lower extremity edema, dizziness, presyncope, or syncope.  The patient is otherwise without complaint today.   Past Medical History  Diagnosis Date  . Hypothyroidism 1990  . Labile hypertension 08/1991  . HLD (hyperlipidemia) 01/1997  . Fibromyalgia   . Bladder spasms   . Basilar artery stenosis     90%  . CAD (coronary artery disease)     a. 06/2009 cath: severe stenosis RCA with placement drug eluting stent;  b. 08/2011 cath: normal left main, luminal LAD irregularities, luminal LCx irregularities, luminal RCA irregularities and no ISR to mid-RCA DES, LVEF 60-65%; c. 02/2013 nonischemic CL, EF 79%.  . IC (interstitial cystitis)   . Obesity   . Hypertension   . Blood transfusion 1960's    w/childbirth  . CVA (cerebral infarction) December 2012  . Pacemaker 11/11/2011    dual chamber pacemaker  . Second degree Mobitz II AV block   . Family history of anesthesia complication     "first cousin had very fever and almost died" (02/27/2013)  . Malignant hyperthermia     "told to warn everybody before I had surgery that my 1st cousin had malignant hyperthermia; I've had symptoms of it too" (02/27/2013)  . Pneumonia     "twice" (02/27/2013)  . Stroke [facility])     "they tell me I've had some mini strokes; nothing that's bothered me though" (02/27/2013)  . Arthritis     "fingers" (02/27/2013)  . Left-sided carotid artery obstruction    Past Surgical History  Procedure Laterality Date  . Bladder suspension  1974  . Cholecystectomy  1985  . Cardiac catheterization  1993     normal  . Renal angioplasty  1993    normal  . Carotid us  11/93    Occl L comm carotid  . Visual evoked response  11/93; 1/01    normal  . [place] renal/aorta  12/93    Normal  . Emg  1/01    LE, RUE normal  . Carotid us  4/98    L comm occl R-ok  . Carotid us  01/30/03    L occl  R less 40%  . Dexa  10/15/04    Troch -0.4 o/w pos  . Carotid arteriogram      bilat  . Bilateral vert & subclavian angiogram  07/19/08    85-90% stenosis mid basilar art; occluded left vert artery; occluded left comm carotid artery w/ collat flow  . Basilar artery angioplasty  09/04/08    sm right brachial hematoma  . Cath single vess dz  06/13/09    70% stenosis RCA EF 65-70%  . Coronary angioplasty with stent placement  06/19/09  . Cataract extraction w/ intraocular lens implant Bilateral 2007-2008  . Eye surgery    . Vaginal hysterectomy  1974    dysmennorhea  . Insert / replace / remove pacemaker  11/11/2011    MDT Adapta L implanted by Dr Johney FrameAllred. [employer]  . Left heart catheterization with coronary angiogram N/A 08/10/2011    Procedure: LEFT HEART CATHETERIZATION WITH CORONARY ANGIOGRAM;  Surgeon:  Kathleene Hazel, MD;  Location: [facility] CATH LAB;  Service: Cardiovascular;  Laterality: N/A;  . Permanent pacemaker insertion N/A 11/11/2011    Procedure: PERMANENT PACEMAKER INSERTION;  Surgeon: Hillis Range, MD;  Location: [facility] CATH LAB;  Service: Cardiovascular;  Laterality: N/A;  . Left heart catheterization with coronary angiogram N/A 07/28/2013    Procedure: LEFT HEART CATHETERIZATION WITH CORONARY ANGIOGRAM;  Surgeon: Kathleene Hazel, MD;  Location: [facility] CATH LAB;  Service: Cardiovascular;  Laterality: N/A;    Current Outpatient Prescriptions  Medication Sig Dispense Refill  . amLODipine (NORVASC) 5 MG tablet TAKE 1 TABLET BY MOUTH EVERY DAY 90 tablet 1  . aspirin EC 81 MG tablet Take 81 mg by mouth every morning.    . clopidogrel (PLAVIX) 75 MG tablet TAKE 1 TABLET BY MOUTH EVERY DAY 90 tablet 1   . Coenzyme Q10 200 MG capsule Take 200 mg by mouth 2 (two) times daily.    . furosemide (LASIX) 40 MG tablet Take 40 mg by mouth daily as needed for edema.    . lansoprazole (PREVACID) 15 MG capsule Take 15 mg by mouth daily as needed (reflux).     Marland Kitchen levothyroxine (SYNTHROID, LEVOTHROID) 100 MCG tablet TAKE 1 TABLET BY MOUTH EVERY DAY AS DIRECTED 90 tablet 1  . losartan-hydrochlorothiazide (HYZAAR) 100-25 MG per tablet TAKE 1 TABLET BY MOUTH EVERY DAY 90 tablet 1  . nitroGLYCERIN (NITROSTAT) 0.4 MG SL tablet Place 0.4 mg under the tongue every 5 (five) minutes as needed (MAX 3 TABLETS). For chest pain    . Omega-3 Fatty Acids (FISH OIL BURP-LESS) 500 MG CAPS Take 1,000 mg by mouth 2 (two) times daily.     . Phenazopyridine HCl (AZO TABS PO) Take 1 tablet by mouth daily as needed (bladder spasms).     . Red Yeast Rice 600 MG CAPS Take 600 mg by mouth 2 (two) times daily.     No current facility-administered medications for this visit.    Physical Exam: Filed Vitals:   10/22/14 1421  BP: 134/70  Pulse: 78  Height:  (1.499 m)  Weight: 197 lb (89.359 kg)    GEN- The patient is well appearing, alert and oriented x 3 today.   Head- normocephalic, atraumatic Eyes-  Sclera clear, conjunctiva pink Ears- hearing intact Oropharynx- clear Lungs- Clear to ausculation bilaterally, normal work of breathing Chest- pacemaker pocket is well healed Heart- Regular rate and rhythm, no murmurs, rubs or gallops, PMI not laterally displaced GI- soft, NT, ND, + BS Extremities- no clubbing, cyanosis, or edema  Pacemaker interrogation- reviewed in detail today,  See PACEART report  Assessment and Plan:  1. Mobitz II second degree AV block Normal pacemaker function See Pace Art report No changes today  2. Exertional shortness of breath This is a chronic issue.  Seems to be doing very well currently No changes  Carelink Return to see EP NP in 1 year Follow-up with Dr Clifton Deepa Barthel in 6  months

## 2014-10-22 NOTE — Patient Instructions (Signed)
Medication Instructions:  Your physician recommends that you continue on your current medications as directed. Please refer to the Current Medication list given to you today.   Labwork: None ordered   Testing/Procedures: None ordered   Follow-Up:  Move Dr Gibson RampMcAlhany's appointment from 10/29/14 out 6 months   Your physician wants you to follow-up in: 12 months with Francis Dowseenee Ursuy, PA  You will receive a reminder letter in the mail two months in advance. If you don't receive a letter, please call our office to schedule the follow-up appointment.   Remote monitoring is used to monitor your Pacemaker from home. This monitoring reduces the number of office visits required to check your device to one time per year. It allows us to keep an eye on the functioning of your device to ensure it is working properly. You are scheduled for a device check from home on 01/21/15. You may send your transmission at any time that day. If you have a wireless device, the transmission will be sent automatically. After your physician reviews your transmission, you will receive a postcard with your next transmission date.    Any Other Special Instructions Will Be Listed Below (If Applicable).

## 2014-10-29 ENCOUNTER — Ambulatory Visit: Payer: Commercial Managed Care - HMO | Admitting: Cardiovascular Disease

## 2014-11-07 ENCOUNTER — Other Ambulatory Visit: Payer: Self-pay | Admitting: Family Medicine

## 2014-11-13 ENCOUNTER — Encounter: Payer: Self-pay | Admitting: Internal Medicine

## 2014-12-17 ENCOUNTER — Ambulatory Visit: Payer: Commercial Managed Care - HMO | Admitting: Cardiovascular Disease

## 2015-01-21 ENCOUNTER — Telehealth: Payer: Self-pay | Admitting: Internal Medicine

## 2015-01-21 ENCOUNTER — Ambulatory Visit (INDEPENDENT_AMBULATORY_CARE_PROVIDER_SITE_OTHER): Payer: Commercial Managed Care - HMO | Admitting: *Deleted

## 2015-01-21 ENCOUNTER — Telehealth: Payer: Self-pay | Admitting: Cardiology

## 2015-01-21 DIAGNOSIS — I441 Atrioventricular block, second degree: Secondary | ICD-10-CM | POA: Diagnosis not present

## 2015-01-21 NOTE — Telephone Encounter (Signed)
Spoke with pt and reminded pt of remote transmission that is due today. Pt verbalized understanding.   

## 2015-01-21 NOTE — Telephone Encounter (Signed)
Informed pt that we did received her remote transmission. Pt verbalized understanding.

## 2015-01-21 NOTE — Telephone Encounter (Signed)
°  1. Has your device fired? no ° °2. Is you device beeping? no ° °3. Are you experiencing draining or swelling at device site? no ° °4. Are you calling to see if we received your device transmission? yes ° °5. Have you passed out? no ° °

## 2015-01-22 ENCOUNTER — Other Ambulatory Visit (HOSPITAL_COMMUNITY): Payer: Self-pay | Admitting: Interventional Radiology

## 2015-01-22 DIAGNOSIS — I771 Stricture of artery: Secondary | ICD-10-CM

## 2015-01-22 LAB — CUP PACEART REMOTE DEVICE CHECK
Battery Remaining Longevity: 131 mo
Battery Voltage: 2.79 V
Brady Statistic AS VP Percent: 14 %
Date Time Interrogation Session: 20170116193854
Implantable Lead Implant Date: 20131106
Implantable Lead Location: 753859
Implantable Lead Model: 5076
Lead Channel Impedance Value: 424 Ohm
Lead Channel Pacing Threshold Amplitude: 1.125 V
Lead Channel Pacing Threshold Pulse Width: 0.4 ms
Lead Channel Pacing Threshold Pulse Width: 0.4 ms
Lead Channel Setting Pacing Amplitude: 2 V
Lead Channel Setting Pacing Amplitude: 2.5 V
Lead Channel Setting Pacing Pulse Width: 0.4 ms
Lead Channel Setting Sensing Sensitivity: 2 mV
MDC IDC LEAD IMPLANT DT: 20131106
MDC IDC LEAD LOCATION: 753860
MDC IDC MSMT BATTERY IMPEDANCE: 182 Ohm
MDC IDC MSMT LEADCHNL RA PACING THRESHOLD AMPLITUDE: 0.375 V
MDC IDC MSMT LEADCHNL RA SENSING INTR AMPL: 2.8 mV
MDC IDC MSMT LEADCHNL RV IMPEDANCE VALUE: 564 Ohm
MDC IDC MSMT LEADCHNL RV SENSING INTR AMPL: 5.6 mV
MDC IDC STAT BRADY AP VP PERCENT: 3 %
MDC IDC STAT BRADY AP VS PERCENT: 30 %
MDC IDC STAT BRADY AS VS PERCENT: 52 %

## 2015-01-22 NOTE — Progress Notes (Signed)
Remote pacemaker transmission.   

## 2015-01-25 ENCOUNTER — Encounter: Payer: Self-pay | Admitting: Cardiology

## 2015-02-03 ENCOUNTER — Other Ambulatory Visit: Payer: Self-pay | Admitting: Family Medicine

## 2015-02-18 ENCOUNTER — Ambulatory Visit (INDEPENDENT_AMBULATORY_CARE_PROVIDER_SITE_OTHER): Payer: Commercial Managed Care - HMO | Admitting: Cardiovascular Disease

## 2015-02-18 VITALS — BP 150/90 | HR 60 | Ht 59.0 in | Wt 199.8 lb

## 2015-02-18 DIAGNOSIS — I5032 Chronic diastolic (congestive) heart failure: Secondary | ICD-10-CM

## 2015-02-18 DIAGNOSIS — I2510 Atherosclerotic heart disease of native coronary artery without angina pectoris: Secondary | ICD-10-CM | POA: Diagnosis not present

## 2015-02-18 DIAGNOSIS — I10 Essential (primary) hypertension: Secondary | ICD-10-CM | POA: Diagnosis not present

## 2015-02-18 DIAGNOSIS — I441 Atrioventricular block, second degree: Secondary | ICD-10-CM

## 2015-02-18 NOTE — Progress Notes (Signed)
Chief Complaint  Patient presents with  . Follow-up    History of Present Illness: 75 yo WF with h/o CAD with DES RCA in June 2011, HTN, hyperlipidemia, hypothyroidism, AV block s/p PPM, CVA and basilar artery stenosis here today for follow up. Left heart cath on 06/13/09 and she was found to have a severe stenosis in the RCA. There was minor disease in the LAD and Circumflex. I placed a single DES in the RCA. She was admitted to [facility] 08/10/11 with dizziness, chest pain and repeat cath showed stable disease. She was observed to have second degree AV block demonstrated with RBBB. Notes during that hospital visit support multiple consecutive P waves not conducted. Her coreg was stopped. She was discharged and wore an event monitor. This did not document arrhythmias or advanced AV block. There were no episodes of AV block documented. GXT was performed in our office 09/01/11. She was observed during the study to have a LBBB with exertion. She developed exertional symptoms of fatigue, SOB, and CP which corresponded to her LBBB. She was seen by Dr. Johney Frame and continued to have episodes of palpitations and dizziness with moderate activity. A permanent pacemaker was placed on 11/11/11 by Dr. Johney Frame. She has been undergoing cerebral angiograms for basilar artery stenosis and has had angiplasty of the basilar artery in August 2010. She is known to have an occluded left common carotid artery. She was admitted to [facility] December 2012 with a CVA. She is being followed by Neurology. Admitted to [facility] February 2015 with chest pain. Stress myoview 2/15 with no ischemia, LVEF-79%. Admitted to [facility] July 2015 with chest pain. Cardiac cath 07/27/13 with stable CAD. Cardiopulmonary stress test 09/12/13 with maximum HR 115 bpm with 7 minutes exercise.  She is here today for follow up. She is feeling well overall. No chest pain or burning. Breathing is at baseline. Weight is stable. She is walking several days per week.    Primary Care Physician: Dr. Para March   Past Medical History  Diagnosis Date  . Hypothyroidism 1990  . Labile hypertension 08/1991  . HLD (hyperlipidemia) 01/1997  . Fibromyalgia   . Bladder spasms   . Basilar artery stenosis     90%  . CAD (coronary artery disease)     a. 06/2009 cath: severe stenosis RCA with placement drug eluting stent;  b. 08/2011 cath: normal left main, luminal LAD irregularities, luminal LCx irregularities, luminal RCA irregularities and no ISR to mid-RCA DES, LVEF 60-65%; c. 02/2013 nonischemic CL, EF 79%.  . IC (interstitial cystitis)   . Obesity   . Hypertension   . Blood transfusion 1960's    w/childbirth  . CVA (cerebral infarction) December 2012  . Pacemaker 11/11/2011    dual chamber pacemaker  . Second degree Mobitz II AV block   . Family history of anesthesia complication     "first cousin had very fever and almost died" (2013/03/08)  . Malignant hyperthermia     "told to warn everybody before I had surgery that my 1st cousin had malignant hyperthermia; I've had symptoms of it too" (03-08-2013)  . Pneumonia     "twice" (Mar 08, 2013)  . Stroke [facility])     "they tell me I've had some mini strokes; nothing that's bothered me though" (03/08/2013)  . Arthritis     "fingers" (2013-03-08)  . Left-sided carotid artery obstruction     Past Surgical History  Procedure Laterality Date  . Bladder suspension  1974  . Cholecystectomy  1985  . Cardiac catheterization  1993    normal  . Renal angioplasty  1993    normal  . Carotid US  11/93    Occl L comm carotid  . Visual evoked response  11/93; 1/01    normal  . US renal/aorta  12/93    Normal  . Emg  1/01    LE, RUE normal  . Carotid US  4/98    L comm occl R-ok  . Carotid US  01/30/03    L occl  R less 40%  . Dexa  10/15/04    Troch -0.4 o/w pos  . Carotid arteriogram      bilat  . Bilateral vert & subclavian angiogram  07/19/08    85-90% stenosis mid basilar art; occluded left vert artery; occluded  left comm carotid artery w/ collat flow  . Basilar artery angioplasty  09/04/08    sm right brachial hematoma  . Cath single vess dz  06/13/09    70% stenosis RCA EF 65-70%  . Coronary angioplasty with stent placement  06/19/09  . Cataract extraction w/ intraocular lens implant Bilateral 2007-2008  . Eye surgery    . Vaginal hysterectomy  1974    dysmennorhea  . Insert / replace / remove pacemaker  11/11/2011    MDT Adapta L implanted by Dr Johney Frame. [employer]  . Left heart catheterization with coronary angiogram N/A 08/10/2011    Procedure: LEFT HEART CATHETERIZATION WITH CORONARY ANGIOGRAM;  Surgeon: Kathleene Hazel, MD;  Location[facility] CATH LAB;  Service: Cardiovascular;  Laterality: N/A;  . Permanent pacemaker insertion N/A 11/11/2011    Procedure: PERMANENT PACEMAKER INSERTION;  Surgeon: Hillis Range, MD;  Location: [facility] CATH LAB;  Service: Cardiovascular;  Laterality: N/A;  . Left heart catheterization with coronary angiogram N/A 07/28/2013    Procedure: LEFT HEART CATHETERIZATION WITH CORONARY ANGIOGRAM;  Surgeon: Kathleene Hazel, MD;  Location: [facility] CATH LAB;  Service: Cardiovascular;  Laterality: N/A;    Current Outpatient Prescriptions  Medication Sig Dispense Refill  . amLODipine (NORVASC) 5 MG tablet TAKE 1 TABLET BY MOUTH EVERY DAY 90 tablet 1  . aspirin EC 81 MG tablet Take 81 mg by mouth every morning.    . clopidogrel (PLAVIX) 75 MG tablet TAKE 1 TABLET BY MOUTH EVERY DAY 90 tablet 0  . furosemide (LASIX) 40 MG tablet Take 40 mg by mouth daily as needed for edema.    . lansoprazole (PREVACID) 15 MG capsule Take 15 mg by mouth daily as needed (reflux).     Marland Kitchen levothyroxine (SYNTHROID, LEVOTHROID) 100 MCG tablet TAKE 1 TABLET BY MOUTH EVERY DAY AS DIRECTED 90 tablet 1  . losartan-hydrochlorothiazide (HYZAAR) 100-25 MG tablet TAKE 1 TABLET BY MOUTH EVERY DAY 90 tablet 0  . nitroGLYCERIN (NITROSTAT) 0.4 MG SL tablet Place 0.4 mg under the tongue every 5 (five) minutes as needed  (MAX 3 TABLETS). For chest pain    . Phenazopyridine HCl (AZO TABS PO) Take 1 tablet by mouth daily as needed (bladder spasms).     . Red Yeast Rice 600 MG CAPS Take 600 mg by mouth 2 (two) times daily.     No current facility-administered medications for this visit.    Allergies  Allergen Reactions  . Anesthetics, Amide Other (See Comments)    REACTION:FEVER, SYNCOPE  . Codeine Nausea And Vomiting    Reaction:Hallucinations  . Nitrofurantoin Nausea Only and Other (See Comments)    REACTION: Hallucinations  . Orphenadrine Citrate Nausea And Vomiting  and Other (See Comments)    REACTION: Hallucination  . Pravastatin Other (See Comments)    Myalgias  . Simvastatin Other (See Comments)    myalgias  . Pentosan Polysulfate Sodium Rash and Other (See Comments)    REACTION: rash/ severe headache    Social History   Social History  . Marital Status: Married    Spouse Name: N/A  . Number of Children: 1  . Years of Education: N/A   Occupational History  . Bookkeeper-part time   . retired    Social History Main Topics  . Smoking status: Never Smoker   . Smokeless tobacco: Never Used  . Alcohol Use: No  . Drug Use: No  . Sexual Activity: Not Currently    Birth Control/ Protection: Post-menopausal   Other Topics Concern  . 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 Lung cancer Father   . Heart attack Mother   . Lymphoma Mother     s/p chemo  . Stroke Mother     x 2  . Diabetes Brother   . Colon cancer Neg Hx   . Breast cancer Maternal Aunt     Review of Systems:  As stated in the HPI and otherwise negative.   BP 150/90 mmHg  Pulse 60  Ht 4\' 11"  (1.499 m)  Wt 199 lb 12.8 oz (90.629 kg)  BMI 40.33 kg/m2  SpO2 96%  Physical Examination: General: Well developed, well  nourished, NAD HEENT: OP clear, mucus membranes moist SKIN: warm, dry. No rashes. Neuro: No focal deficits Musculoskeletal: Muscle strength 5/5 all ext Psychiatric: Mood and affect normal Neck: No JVD, no carotid bruits, no thyromegaly, no lymphadenopathy. Lungs:Clear bilaterally, no wheezes, rhonci, crackles Cardiovascular: Regular rate and rhythm. No murmurs, gallops or rubs. Abdomen:Soft. Bowel sounds present. Non-tender.  Extremities: No lower extremity edema. Pulses are 2 + in the bilateral DP/PT.  Cardiac cath 07/27/13: Left main: No obstructive disease.  Left Anterior Descending Artery: Large caliber vessel that courses to the apex. The mid vessel has diffuse 20% stenosis. There is a very small caliber diagonal branch with proximal 40% stenosis. There are no flow limiting lesions noted.  Circumflex Artery: Moderate caliber vessel with moderate caliber obtuse marginal branch. No obstructive disease.  Right Coronary Artery: Large caliber vessel with 20% mid stenosis, patent distal stent without restenosis.  Left Ventricular Angiogram: LVEF=65-70%.  Impression:  1. Single vessel CAD with patent stent RCA  2. Normal LV function  3. Non-cardiac chest pain   Echo 02/21/14: Left ventricle: The cavity size was normal. There was mild focal basal hypertrophy of the septum. Systolic function was normal. The estimated ejection fraction was in the range of 60% to 65%. Wall motion was normal; there were no regional wall motion abnormalities. - Aortic valve: Moderately calcified annulus. Trileaflet. Mild thickening and calcification, consistent with sclerosis. - Right ventricle: Pacer wire or catheter noted in right ventricle. - Right atrium: Pacer wire or catheter noted in right atrium. - Tricuspid valve: There was mild regurgitation. - Pulmonic valve: There was trivial regurgitation.  EKG:  EKG is ordered today. The ekg ordered today demonstrates AV paced, rate 67 bpm.    Recent Labs: 03/26/2014: TSH 1.406 03/27/2014: ALT 18; Hemoglobin 14.7; Platelets 221 10/01/2014: BUN 19;  Creatinine, Ser 0.84; Potassium 3.7; Sodium 140   Lipid Panel    Component Value Date/Time   CHOL 150 03/26/2014 2330   TRIG 154* 03/26/2014 2330   HDL 42 03/26/2014 2330   CHOLHDL 3.6 03/26/2014 2330   VLDL 31 03/26/2014 2330   LDLCALC 77 03/26/2014 2330   LDLDIRECT 145.0 10/01/2014 1336     Wt Readings from Last 3 Encounters:  02/18/15 199 lb 12.8 oz (90.629 kg)  10/22/14 197 lb (89.359 kg)  10/01/14 195 lb 8 oz (88.678 kg)     Other studies Reviewed: Additional studies/ records that were reviewed today include: . Review of the above records demonstrates:    Assessment and Plan:   1. CAD: Stable. Cath July 2015 with stable CAD. No changes today. Continue Plavix and statin.   2. High grade symptomatic AV block: s/p PPM 11/10/11 per Dr. Johney Frame. Followed in EP clinic.    3. HTN: BP has been well controlled at home. No changes today.   4. Chronic diastolic CHF: She is using Lasix prn. Weight is stable.   5. Aortic valve sclerosis: No stenosis.   Current medicines are reviewed at length with the patient today.  The patient does not have concerns regarding medicines.  The following changes have been made:  no change  Labs/ tests ordered today include:   Orders Placed This Encounter  Procedures  . EKG 12-Lead    Disposition:   FU with me in 12  months  Signed, Verne Carrow, MD 02/18/2015 12:15 PM    [facility] [address] [place], [place], [place]  [address] Phone: [phone]; Fax: [phone]

## 2015-02-18 NOTE — Patient Instructions (Signed)
Medication Instructions:  Your physician has recommended you make the following change in your medication:  Stop CoQ10. Stop Fish oil   Labwork: none  Testing/Procedures: none  Follow-Up: Your physician wants you to follow-up in: 12 months.  You will receive a reminder letter in the mail two months in advance. If you don't receive a letter, please call our office to schedule the follow-up appointment.   Any Other Special Instructions Will Be Listed Below (If Applicable).     If you need a refill on your cardiac medications before your next appointment, please call your pharmacy.

## 2015-03-01 ENCOUNTER — Ambulatory Visit (HOSPITAL_COMMUNITY): Payer: Commercial Managed Care - HMO

## 2015-03-01 ENCOUNTER — Ambulatory Visit (HOSPITAL_COMMUNITY)
Admission: RE | Admit: 2015-03-01 | Discharge: 2015-03-01 | Disposition: A | Discharge status: Home / Self Care | Payer: Commercial Managed Care - HMO | Source: Ambulatory Visit | Attending: Interventional Radiology | Admitting: Interventional Radiology

## 2015-03-01 ENCOUNTER — Encounter (HOSPITAL_COMMUNITY): Payer: Self-pay

## 2015-03-01 DIAGNOSIS — I651 Occlusion and stenosis of basilar artery: Secondary | ICD-10-CM | POA: Diagnosis not present

## 2015-03-01 DIAGNOSIS — I771 Stricture of artery: Secondary | ICD-10-CM | POA: Diagnosis not present

## 2015-03-01 DIAGNOSIS — I6522 Occlusion and stenosis of left carotid artery: Secondary | ICD-10-CM | POA: Diagnosis not present

## 2015-03-01 LAB — POCT I-STAT CREATININE: CREATININE: 1 mg/dL (ref 0.44–1.00)

## 2015-03-01 MED ORDER — IOHEXOL 350 MG/ML SOLN
50.0000 mL | Freq: Once | INTRAVENOUS | Status: AC | PRN
  Administered 2015-03-01: 50 mL via INTRAVENOUS

## 2015-03-08 ENCOUNTER — Telehealth (HOSPITAL_COMMUNITY): Payer: Self-pay | Admitting: Interventional Radiology

## 2015-03-08 NOTE — Telephone Encounter (Signed)
Pt called concerning her recent CTA head. I told her that she would be due for follow-up CTA head/neck in 1 year's time. Pt agrees with this plan of care. JM

## 2015-04-22 ENCOUNTER — Ambulatory Visit (INDEPENDENT_AMBULATORY_CARE_PROVIDER_SITE_OTHER): Payer: Commercial Managed Care - HMO | Admitting: *Deleted

## 2015-04-22 DIAGNOSIS — I441 Atrioventricular block, second degree: Secondary | ICD-10-CM | POA: Diagnosis not present

## 2015-04-22 NOTE — Progress Notes (Signed)
Remote pacemaker transmission.   

## 2015-04-25 ENCOUNTER — Other Ambulatory Visit: Payer: Self-pay | Admitting: Family Medicine

## 2015-05-15 ENCOUNTER — Other Ambulatory Visit: Payer: Self-pay | Admitting: Family Medicine

## 2015-05-17 DIAGNOSIS — H43813 Vitreous degeneration, bilateral: Secondary | ICD-10-CM | POA: Diagnosis not present

## 2015-05-26 ENCOUNTER — Other Ambulatory Visit: Payer: Self-pay | Admitting: Family Medicine

## 2015-05-26 DIAGNOSIS — E559 Vitamin D deficiency, unspecified: Secondary | ICD-10-CM

## 2015-05-26 DIAGNOSIS — R7309 Other abnormal glucose: Secondary | ICD-10-CM

## 2015-05-26 DIAGNOSIS — I10 Essential (primary) hypertension: Secondary | ICD-10-CM

## 2015-05-26 DIAGNOSIS — E039 Hypothyroidism, unspecified: Secondary | ICD-10-CM

## 2015-05-28 ENCOUNTER — Ambulatory Visit (INDEPENDENT_AMBULATORY_CARE_PROVIDER_SITE_OTHER): Payer: Commercial Managed Care - HMO

## 2015-05-28 ENCOUNTER — Other Ambulatory Visit (INDEPENDENT_AMBULATORY_CARE_PROVIDER_SITE_OTHER): Payer: Commercial Managed Care - HMO

## 2015-05-28 VITALS — BP 138/88 | HR 62 | Temp 98.1°F | Ht 59.75 in | Wt 197.8 lb

## 2015-05-28 DIAGNOSIS — R7309 Other abnormal glucose: Secondary | ICD-10-CM | POA: Diagnosis not present

## 2015-05-28 DIAGNOSIS — Z1239 Encounter for other screening for malignant neoplasm of breast: Secondary | ICD-10-CM

## 2015-05-28 DIAGNOSIS — E559 Vitamin D deficiency, unspecified: Secondary | ICD-10-CM

## 2015-05-28 DIAGNOSIS — I10 Essential (primary) hypertension: Secondary | ICD-10-CM

## 2015-05-28 DIAGNOSIS — Z0000 Encounter for general adult medical examination without abnormal findings: Secondary | ICD-10-CM

## 2015-05-28 DIAGNOSIS — Z23 Encounter for immunization: Secondary | ICD-10-CM | POA: Diagnosis not present

## 2015-05-28 LAB — COMPREHENSIVE METABOLIC PANEL
ALBUMIN: 4.3 g/dL (ref 3.5–5.2)
ALK PHOS: 49 U/L (ref 39–117)
ALT: 15 U/L (ref 0–35)
AST: 18 U/L (ref 0–37)
BILIRUBIN TOTAL: 0.6 mg/dL (ref 0.2–1.2)
BUN: 18 mg/dL (ref 6–23)
CALCIUM: 10.1 mg/dL (ref 8.4–10.5)
CO2: 32 meq/L (ref 19–32)
CREATININE: 0.85 mg/dL (ref 0.40–1.20)
Chloride: 104 mEq/L (ref 96–112)
GFR: 69.77 mL/min (ref >60.00)
Glucose, Bld: 99 mg/dL (ref 70–99)
Potassium: 3.8 mEq/L (ref 3.5–5.1)
Sodium: 140 mEq/L (ref 135–145)
Total Protein: 6.8 g/dL (ref 6.0–8.3)

## 2015-05-28 LAB — LIPID PANEL
CHOLESTEROL: 196 mg/dL (ref 0–200)
HDL: 36.2 mg/dL — AB (ref >39.00)
LDL Cholesterol: 129 mg/dL — ABNORMAL HIGH (ref 0–99)
NonHDL: 160.13
TRIGLYCERIDES: 155 mg/dL — AB (ref 0.0–149.0)
Total CHOL/HDL Ratio: 5
VLDL: 31 mg/dL (ref 0.0–40.0)

## 2015-05-28 LAB — TSH: TSH: 1.05 u[IU]/mL (ref 0.35–4.50)

## 2015-05-28 LAB — HEMOGLOBIN A1C: HEMOGLOBIN A1C: 5.7 % (ref 4.6–6.5)

## 2015-05-28 LAB — VITAMIN D 25 HYDROXY (VIT D DEFICIENCY, FRACTURES): VITD: 24.86 ng/mL — ABNORMAL LOW (ref 30.00–100.00)

## 2015-05-28 NOTE — Progress Notes (Signed)
Subjective:   Kelsey Dunn is a 75 y.o. female who presents for Medicare Annual (Subsequent) preventive examination.  Review of Systems:  N/A Cardiac Risk Factors include: advanced age (>37men, >95 women);hypertension     Objective:     Vitals: BP 138/88 mmHg  Pulse 62  Temp(Src) 98.1 F (36.7 C) (Oral)  Ht 4' 11.75" (1.518 m)  Wt 197 lb 12 oz (89.699 kg)  BMI 38.93 kg/m2  SpO2 95%  Body mass index is 38.93 kg/(m^2).   Tobacco History  Smoking status  . Never Smoker   Smokeless tobacco  . Never Used     Counseling given: No   Past Medical History  Diagnosis Date  . Hypothyroidism 1990  . Labile hypertension 08/1991  . HLD (hyperlipidemia) 01/1997  . Fibromyalgia   . Bladder spasms   . Basilar artery stenosis     90%  . CAD (coronary artery disease)     a. 06/2009 cath: severe stenosis RCA with placement drug eluting stent;  b. 08/2011 cath: normal left main, luminal LAD irregularities, luminal LCx irregularities, luminal RCA irregularities and no ISR to mid-RCA DES, LVEF 60-65%; c. 02/2013 nonischemic CL, EF 79%.  . IC (interstitial cystitis)   . Obesity   . Hypertension   . Blood transfusion 1960's    w/childbirth  . CVA (cerebral infarction) December 2012  . Pacemaker 11/11/2011    dual chamber pacemaker  . Second degree Mobitz II AV block   . Family history of anesthesia complication     "first cousin had very fever and almost died" (03/23/2013)  . Malignant hyperthermia     "told to warn everybody before I had surgery that my 1st cousin had malignant hyperthermia; I've had symptoms of it too" (Mar 23, 2013)  . Pneumonia     "twice" (23-Mar-2013)  . Stroke [facility])     "they tell me I've had some mini strokes; nothing that's bothered me though" (Mar 23, 2013)  . Arthritis     "fingers" (2013/03/23)  . Left-sided carotid artery obstruction    Past Surgical History  Procedure Laterality Date  . Bladder suspension  1974  . Cholecystectomy  1985  . Cardiac  catheterization  1993    normal  . Renal angioplasty  1993    normal  . Carotid US  11/93    Occl L comm carotid  . Visual evoked response  11/93; 1/01    normal  . US renal/aorta  12/93    Normal  . Emg  1/01    LE, RUE normal  . Carotid US  4/98    L comm occl R-ok  . Carotid US  01/30/03    L occl  R less 40%  . Dexa  10/15/04    Troch -0.4 o/w pos  . Carotid arteriogram      bilat  . Bilateral vert & subclavian angiogram  07/19/08    85-90% stenosis mid basilar art; occluded left vert artery; occluded left comm carotid artery w/ collat flow  . Basilar artery angioplasty  09/04/08    sm right brachial hematoma  . Cath single vess dz  06/13/09    70% stenosis RCA EF 65-70%  . Coronary angioplasty with stent placement  06/19/09  . Cataract extraction w/ intraocular lens implant Bilateral 2007-2008  . Eye surgery    . Vaginal hysterectomy  1974    dysmennorhea  . Insert / replace / remove pacemaker  11/11/2011    MDT Adapta L implanted by Dr  Allred. [employer]  . Left heart catheterization with coronary angiogram N/A 08/10/2011    Procedure: LEFT HEART CATHETERIZATION WITH CORONARY ANGIOGRAM;  Surgeon: Kathleene Hazelhristopher D McAlhany, MD;  [facility];  Service: Cardiovascular;  Laterality: N/A;  . Permanent pacemaker insertion N/A 11/11/2011    Procedure: PERMANENT PACEMAKER INSERTION;  Surgeon: Hillis RangeJames Allred, MD;  [facility];  Service: Cardiovascular;  Laterality: N/A;  . Left heart catheterization with coronary angiogram N/A 07/28/2013    Procedure: LEFT HEART CATHETERIZATION WITH CORONARY ANGIOGRAM;  Surgeon: Kathleene Hazelhristopher D McAlhany, MD;  [facility];  Service: Cardiovascular;  Laterality: N/A;   Family History  Problem Relation Age of Onset  . Lung cancer Father   . Heart attack Mother   . Lymphoma Mother     s/p chemo  . Stroke Mother     x 2  . Diabetes Brother   . Colon cancer Neg Hx   . Breast cancer Maternal Aunt    History  Sexual Activity  .  Sexual Activity: Not Currently  . Birth Control/ Protection: Post-menopausal    Outpatient Encounter Prescriptions as of 05/28/2015  Medication Sig  . amLODipine (NORVASC) 5 MG tablet TAKE 1 TABLET BY MOUTH EVERY DAY  . aspirin EC 81 MG tablet Take 81 mg by mouth every morning.  . clopidogrel (PLAVIX) 75 MG tablet TAKE 1 TABLET BY MOUTH EVERY DAY  . furosemide (LASIX) 40 MG tablet Take 40 mg by mouth daily as needed for edema.  . lansoprazole (PREVACID) 15 MG capsule Take 15 mg by mouth daily as needed (reflux).   Marland Kitchen. levothyroxine (SYNTHROID, LEVOTHROID) 100 MCG tablet TAKE 1 TABLET BY MOUTH EVERY DAY AS DIRECTED  . losartan-hydrochlorothiazide (HYZAAR) 100-25 MG tablet TAKE 1 TABLET BY MOUTH EVERY DAY  . nitroGLYCERIN (NITROSTAT) 0.4 MG SL tablet Place 0.4 mg under the tongue every 5 (five) minutes as needed (MAX 3 TABLETS). For chest pain  . Phenazopyridine HCl (AZO TABS PO) Take 1 tablet by mouth daily as needed (bladder spasms).   . Red Yeast Rice 600 MG CAPS Take 600 mg by mouth 2 (two) times daily.   No facility-administered encounter medications on file as of 05/28/2015.    Activities of Daily Living In your present state of health, do you have any difficulty performing the following activities: 05/28/2015  Hearing? N  Vision? N  Difficulty concentrating or making decisions? N  Walking or climbing stairs? N  Dressing or bathing? N  Doing errands, shopping? N  Preparing Food and eating ? N  Using the Toilet? N  In the past six months, have you accidently leaked urine? N  Do you have problems with loss of bowel control? N  Managing your Medications? N  Managing your Finances? N  Housekeeping or managing your Housekeeping? N    Patient Care Team: Joaquim NamGraham S [name], MD as PCP - General (Family Medicine) Kathleene Hazelhristopher D McAlhany, MD as Consulting Physician (Cardiology) Hillis RangeJames Allred, MD as Consulting Physician (Cardiology) Galen ManilaWilliam Porfilio, MD as Referring Physician  (Ophthalmology)    Assessment:     Hearing Screening   125Hz  250Hz  500Hz  1000Hz  2000Hz  4000Hz  8000Hz   Right ear:   40 40 40 40   Left ear:   40 40 40 40   Vision Screening Comments: Last eye exam in May 2017  Exercise Activities and Dietary recommendations Current Exercise Habits: Home exercise routine, Type of exercise: walking, Time (Minutes): 30, Frequency (Times/Week): 7, Weekly Exercise (Minutes/Week): 210, Intensity: Moderate, Exercise limited by:  None identified  Goals    . Increase physical activity     Starting 05/28/15, I will continue to walk at least 30 min daily.       Fall Risk Fall Risk  05/28/2015 02/20/2013  Falls in the past year? No No   Depression Screen PHQ 2/9 Scores 05/28/2015 02/20/2013  PHQ - 2 Score 0 0     Cognitive Testing MMSE - Mini Mental State Exam 05/28/2015  Orientation to time 5  Orientation to Place 5  Registration 3  Attention/ Calculation 0  Recall 3  Language- name 2 objects 0  Language- repeat 1  Language- follow 3 step command 3  Language- read & follow direction 0  Write a sentence 0  Copy design 0  Total score 20   PLEASE NOTE: A Mini-Cog screen was completed. Maximum score is 20. A value of 0 denotes this part of Folstein MMSE was not completed or the patient failed this part of the Mini-Cog screening.   Mini-Cog Screening Orientation to Time - Max 5 pts Orientation to Place - Max 5 pts Registration - Max 3 pts Recall - Max 3 pts Language Repeat - Max 1 pts Language Follow 3 Step Command - Max 3 pts  Immunization History  Administered Date(s) Administered  . Influenza Split 11/05/2010  . Influenza,inj,Quad PF,36+ Mos 10/13/2012, 10/12/2013, 10/12/2014  . Pneumococcal Conjugate-13 05/28/2015  . Pneumococcal Polysaccharide-23 09/05/2008  . Td 12/05/1997   Screening Tests Health Maintenance  Topic Date Due  . MAMMOGRAM  10/06/2015 (Originally 04/11/2015)  . COLON CANCER SCREENING ANNUAL FOBT  10/06/2015 (Originally  11/02/1992)  . TETANUS/TDAP  05/27/2016 (Originally 12/06/2007)  . ZOSTAVAX  05/28/2023 (Originally 11/03/2002)  . COLONOSCOPY  05/27/2025 (Originally 11/02/1992)  . INFLUENZA VACCINE  08/06/2015  . DEXA SCAN  Completed  . PNA vac Low Risk Adult  Completed      Plan:     I have personally reviewed and addressed the Medicare Annual Wellness questionnaire and have noted the following in the patient's chart:  A. Medical and social history B. Use of alcohol, tobacco or illicit drugs  C. Current medications and supplements D. Functional ability and status E.  Nutritional status F.  Physical activity G. Advance directives H. List of other physicians I.  Hospitalizations, surgeries, and ER visits in previous 12 months J.  Vitals K. Screenings to include hearing, vision, cognitive, depression L. Referrals and appointments - none  In addition, I have reviewed and discussed with patient certain preventive protocols, quality metrics, and best practice recommendations. A written personalized care plan for preventive services as well as general preventive health recommendations were provided to patient.  See attached scanned questionnaire for additional information.   Signed,   Randa Evens, MHA, BS, LPN Health Advisor

## 2015-05-28 NOTE — Progress Notes (Signed)
PCP notes:  Health maintenance:  Colon cancer screening - fecal occult test kit given to pt PCV13 - administered Mammogram - order generated; pt will call to schedule Shingles - postponed due to cost Tetanus - postponed until insurance coverage is verified  Abnormal screenings: None  Patient concerns: None  Nurse concerns: None   Next PCP appt: 06/04/15 @ 1415

## 2015-05-28 NOTE — Patient Instructions (Signed)
[name] , Thank you for taking time to come for your Medicare Wellness Visit. I appreciate your ongoing commitment to your health goals. Please review the following plan we discussed and let me know if I can assist you in the future.   These are the goals we discussed: Goals    . Increase physical activity     Starting 05/28/15, I will continue to walk at least 30 min daily.        This is a list of the screening recommended for you and due dates:  Health Maintenance  Topic Date Due  . Mammogram  10/06/2015*  . Stool Blood Test  10/06/2015*  . Tetanus Vaccine  05/27/2016*  . Shingles Vaccine  05/28/2023*  . Colon Cancer Screening  05/27/2025*  . Flu Shot  08/06/2015  . DEXA scan (bone density measurement)  Completed  . Pneumonia vaccines  Completed  *Topic was postponed. The date shown is not the original due date.    Preventive Care for Adults  A healthy lifestyle and preventive care can promote health and wellness. Preventive health guidelines for adults include the following key practices.  . A routine yearly physical is a good way to check with your health care provider about your health and preventive screening. It is a chance to share any concerns and updates on your health and to receive a thorough exam.  . Visit your dentist for a routine exam and preventive care every 6 months. Brush your teeth twice a day and floss once a day. Good oral hygiene prevents tooth decay and gum disease.  . The frequency of eye exams is based on your age, health, family medical history, use  of contact lenses, and other factors. Follow your health care provider's ecommendations for frequency of eye exams.  . Eat a healthy diet. Foods like vegetables, fruits, whole grains, low-fat dairy products, and lean protein foods contain the nutrients you need without too many calories. Decrease your intake of foods high in solid fats, added sugars, and salt. Eat the right amount of calories for you.  Get information about a proper diet from your health care provider, if necessary.  . Regular physical exercise is one of the most important things you can do for your health. Most adults should get at least 150 minutes of moderate-intensity exercise (any activity that increases your heart rate and causes you to sweat) each week. In addition, most adults need muscle-strengthening exercises on 2 or more days a week.  Silver Sneakers may be a benefit available to you. To determine eligibility, you may visit the website: www.silversneakers.com or contact program at 25273462851-2167122724 Mon-Fri between 8AM-8PM.   . Maintain a healthy weight. The body mass index (BMI) is a screening tool to identify possible weight problems. It provides an estimate of body fat based on height and weight. Your health care provider can find your BMI and can help you achieve or maintain a healthy weight.   For adults 20 years and older: ? A BMI below 18.5 is considered underweight. ? A BMI of 18.5 to 24.9 is normal. ? A BMI of 25 to 29.9 is considered overweight. ? A BMI of 30 and above is considered obese.   . Maintain normal blood lipids and cholesterol levels by exercising and minimizing your intake of saturated fat. Eat a balanced diet with plenty of fruit and vegetables. Blood tests for lipids and cholesterol should begin at age 73 and be repeated every 5 years. If your lipid  or cholesterol levels are high, you are over 50, or you are at high risk for heart disease, you may need your cholesterol levels checked more frequently. Ongoing high lipid and cholesterol levels should be treated with medicines if diet and exercise are not working.  . If you smoke, find out from your health care provider how to quit. If you do not use tobacco, please do not start.  . If you choose to drink alcohol, please do not consume more than 2 drinks per day. One drink is considered to be 12 ounces (355 mL) of beer, 5 ounces (148 mL) of wine, or  1.5 ounces (44 mL) of liquor.  . If you are 69-60 years old, ask your health care provider if you should take aspirin to prevent strokes.  . Use sunscreen. Apply sunscreen liberally and repeatedly throughout the day. You should seek shade when your shadow is shorter than you. Protect yourself by wearing long sleeves, pants, a wide-brimmed hat, and sunglasses year round, whenever you are outdoors.  . Once a month, do a whole body skin exam, using a mirror to look at the skin on your back. Tell your health care provider of new moles, moles that have irregular borders, moles that are larger than a pencil eraser, or moles that have changed in shape or color.

## 2015-05-28 NOTE — Progress Notes (Signed)
Pre visit review using our clinic review tool, if applicable. No additional management support is needed unless otherwise documented below in the visit note.  I reviewed health advisor's note, was available for consultation on the day of service listed in this note, and agree with documentation and plan. Graham [name], MD.  

## 2015-05-30 ENCOUNTER — Encounter: Payer: Self-pay | Admitting: Cardiology

## 2015-05-30 LAB — CUP PACEART REMOTE DEVICE CHECK
Battery Remaining Longevity: 125 mo
Battery Voltage: 2.78 V
Brady Statistic AP VP Percent: 14 %
Brady Statistic AP VS Percent: 19 %
Brady Statistic AS VP Percent: 36 %
Implantable Lead Implant Date: 20131106
Implantable Lead Location: 753859
Implantable Lead Model: 5092
Lead Channel Impedance Value: 412 Ohm
Lead Channel Impedance Value: 575 Ohm
Lead Channel Pacing Threshold Amplitude: 0.375 V
Lead Channel Pacing Threshold Pulse Width: 0.4 ms
Lead Channel Pacing Threshold Pulse Width: 0.4 ms
Lead Channel Setting Pacing Amplitude: 2 V
Lead Channel Setting Sensing Sensitivity: 2 mV
MDC IDC LEAD IMPLANT DT: 20131106
MDC IDC LEAD LOCATION: 753860
MDC IDC MSMT BATTERY IMPEDANCE: 182 Ohm
MDC IDC MSMT LEADCHNL RA SENSING INTR AMPL: 2.8 mV
MDC IDC MSMT LEADCHNL RV PACING THRESHOLD AMPLITUDE: 1.125 V
MDC IDC MSMT LEADCHNL RV SENSING INTR AMPL: 5.6 mV
MDC IDC SESS DTM: 20170417102258
MDC IDC SET LEADCHNL RV PACING AMPLITUDE: 2.5 V
MDC IDC SET LEADCHNL RV PACING PULSEWIDTH: 0.4 ms
MDC IDC STAT BRADY AS VS PERCENT: 31 %

## 2015-06-04 ENCOUNTER — Encounter: Payer: Self-pay | Admitting: Family Medicine

## 2015-06-04 ENCOUNTER — Ambulatory Visit (INDEPENDENT_AMBULATORY_CARE_PROVIDER_SITE_OTHER): Payer: Commercial Managed Care - HMO | Admitting: Family Medicine

## 2015-06-04 VITALS — BP 138/80 | HR 76 | Temp 97.8°F | Ht 60.0 in | Wt 198.5 lb

## 2015-06-04 DIAGNOSIS — E559 Vitamin D deficiency, unspecified: Secondary | ICD-10-CM

## 2015-06-04 DIAGNOSIS — R509 Fever, unspecified: Secondary | ICD-10-CM

## 2015-06-04 DIAGNOSIS — W57XXXA Bitten or stung by nonvenomous insect and other nonvenomous arthropods, initial encounter: Secondary | ICD-10-CM

## 2015-06-04 DIAGNOSIS — E7800 Pure hypercholesterolemia, unspecified: Secondary | ICD-10-CM | POA: Diagnosis not present

## 2015-06-04 DIAGNOSIS — E039 Hypothyroidism, unspecified: Secondary | ICD-10-CM

## 2015-06-04 DIAGNOSIS — I10 Essential (primary) hypertension: Secondary | ICD-10-CM

## 2015-06-04 DIAGNOSIS — T148 Other injury of unspecified body region: Secondary | ICD-10-CM

## 2015-06-04 DIAGNOSIS — Z1211 Encounter for screening for malignant neoplasm of colon: Secondary | ICD-10-CM

## 2015-06-04 DIAGNOSIS — R300 Dysuria: Secondary | ICD-10-CM

## 2015-06-04 LAB — POC URINALSYSI DIPSTICK (AUTOMATED)
BILIRUBIN UA: NEGATIVE
Blood, UA: NEGATIVE
GLUCOSE UA: NEGATIVE
KETONES UA: NEGATIVE
LEUKOCYTES UA: NEGATIVE
NITRITE UA: NEGATIVE
Protein, UA: NEGATIVE
Spec Grav, UA: >=1.03
Urobilinogen, UA: 4
pH, UA: 6

## 2015-06-04 MED ORDER — LOSARTAN POTASSIUM-HCTZ 100-25 MG PO TABS: 1.0000 | ORAL_TABLET | Freq: Every day | ORAL | Status: DC

## 2015-06-04 MED ORDER — CLOPIDOGREL BISULFATE 75 MG PO TABS: 75.0000 mg | ORAL_TABLET | Freq: Every day | ORAL | Status: DC

## 2015-06-04 MED ORDER — AMLODIPINE BESYLATE 5 MG PO TABS: 5.0000 mg | ORAL_TABLET | Freq: Every day | ORAL | Status: DC

## 2015-06-04 MED ORDER — CHOLECALCIFEROL 25 MCG (1000 UT) PO CAPS: 1000.0000 [IU] | ORAL_CAPSULE | Freq: Every day | ORAL | Status: DC

## 2015-06-04 MED ORDER — LEVOTHYROXINE SODIUM 100 MCG PO TABS: 100.0000 ug | ORAL_TABLET | Freq: Every day | ORAL | Status: DC

## 2015-06-04 NOTE — Patient Instructions (Signed)
Don't change your meds for now.  When you are feeling better, add on vitamin D, otc. 1 a day.  If not better in a few days, or if worse, then update me. We'll contact you with your lab report. Take care.  Glad to see you.

## 2015-06-04 NOTE — Progress Notes (Signed)
Pre visit review using our clinic review tool, if applicable. No additional management support is needed unless otherwise documented below in the visit note.  Tick between the 3rd and 4th toes on the R foot.  Recently noted.  Not engorged.  Removed w/o complication.    Recently with temp up to 100.2 and R sided back pain, also in the R flank.  Nausea w/o vomiting.  No diarrhea.  Dec in appetite.  "I just felt bad but today I feel more like myself today."  No typical burning with urination but the urine feels "hot" with the initial portion of the stream but this coincides with dec in fluid intake recently with nausea.  "As this day goes on today, I feel better."    Vit d def, d/w pt.  Not on replacement.  Labs and replacement d/w pt.    HLD.  Statin intolerant.  Labs d/w pt. She is trying to lose weight, I encouraged her.   Hypertension:    Using medication without problems or lightheadedness: yes Chest pain with exertion:no Edema:no Short of breath:no  Hypothyroid. No neck mass.  Compliant with med. No ADE on med.  TSH wnl.   PMH and SH reviewed  ROS: Per HPI unless specifically indicated in ROS section   Meds, vitals, and allergies reviewed.   GEN: nad, alert and oriented HEENT: mucous membranes moist NECK: supple w/o LA, thyroid not ttp CV: rrr.  no murmur PULM: ctab, no inc wob ABD: soft, +bs EXT: no edema SKIN: no acute rash Flank not ttp, abd not ttp

## 2015-06-05 ENCOUNTER — Other Ambulatory Visit: Payer: Commercial Managed Care - HMO

## 2015-06-05 DIAGNOSIS — R300 Dysuria: Secondary | ICD-10-CM | POA: Insufficient documentation

## 2015-06-05 DIAGNOSIS — W57XXXA Bitten or stung by nonvenomous insect and other nonvenomous arthropods, initial encounter: Secondary | ICD-10-CM | POA: Insufficient documentation

## 2015-06-05 NOTE — Assessment & Plan Note (Signed)
D/w pt to start replacement, see AVS.

## 2015-06-05 NOTE — Assessment & Plan Note (Signed)
Likely from dec in fluid intake from recent nausea, some better now.  I think she may have had a transient illness, possible GI illness, that led to the other sx.  Check ucx in meantime.  She clearly feels better, is nontoxic, okay for outpatient f/u.  No specific tx o/w, other than inc in fluid intake.  She agrees. She'll update me with routine cautions given.

## 2015-06-05 NOTE — Assessment & Plan Note (Signed)
Controlled, no change in meds. Labs d/w pt.  Continue work on Ray[employer]weight with diet and exercise as tolerated.

## 2015-06-05 NOTE — Assessment & Plan Note (Signed)
Controlled, no change in meds. Labs d/w pt.  

## 2015-06-05 NOTE — Assessment & Plan Note (Signed)
Continue RYR and work on diet and exercise, labs d/w pt.  She agrees.  Statin intolerant.

## 2015-06-05 NOTE — Assessment & Plan Note (Signed)
Update me as needed, removed w/o complication.  Shouldn't be a problem.  Wasn't engorged.

## 2015-06-06 ENCOUNTER — Other Ambulatory Visit (INDEPENDENT_AMBULATORY_CARE_PROVIDER_SITE_OTHER): Payer: Commercial Managed Care - HMO

## 2015-06-06 DIAGNOSIS — Z1211 Encounter for screening for malignant neoplasm of colon: Secondary | ICD-10-CM | POA: Diagnosis not present

## 2015-06-06 LAB — FECAL OCCULT BLOOD, IMMUNOCHEMICAL: FECAL OCCULT BLD: NEGATIVE

## 2015-06-06 LAB — URINE CULTURE
COLONY COUNT: NO GROWTH
ORGANISM ID, BACTERIA: NO GROWTH

## 2015-06-06 NOTE — Addendum Note (Signed)
Addended by: Alvina ChouWALSH, TERRI J on: 06/06/2015 02:43 PM   Modules accepted: Orders, SmartSet

## 2015-06-10 ENCOUNTER — Encounter: Payer: Self-pay | Admitting: *Deleted

## 2015-07-22 ENCOUNTER — Ambulatory Visit (INDEPENDENT_AMBULATORY_CARE_PROVIDER_SITE_OTHER): Payer: Commercial Managed Care - HMO | Admitting: *Deleted

## 2015-07-22 DIAGNOSIS — I441 Atrioventricular block, second degree: Secondary | ICD-10-CM

## 2015-07-22 NOTE — Progress Notes (Signed)
Remote pacemaker transmission.   

## 2015-07-24 ENCOUNTER — Encounter: Payer: Self-pay | Admitting: Cardiology

## 2015-07-25 LAB — CUP PACEART REMOTE DEVICE CHECK
Battery Remaining Longevity: 122 mo
Battery Voltage: 2.78 V
Brady Statistic AS VS Percent: 21 %
Date Time Interrogation Session: 20170717122615
Implantable Lead Implant Date: 20131106
Implantable Lead Model: 5092
Lead Channel Pacing Threshold Amplitude: 1 V
Lead Channel Pacing Threshold Pulse Width: 0.4 ms
Lead Channel Setting Pacing Amplitude: 2 V
Lead Channel Setting Sensing Sensitivity: 2 mV
MDC IDC LEAD IMPLANT DT: 20131106
MDC IDC LEAD LOCATION: 753859
MDC IDC LEAD LOCATION: 753860
MDC IDC MSMT BATTERY IMPEDANCE: 183 Ohm
MDC IDC MSMT LEADCHNL RA IMPEDANCE VALUE: 412 Ohm
MDC IDC MSMT LEADCHNL RA PACING THRESHOLD AMPLITUDE: 0.5 V
MDC IDC MSMT LEADCHNL RA PACING THRESHOLD PULSEWIDTH: 0.4 ms
MDC IDC MSMT LEADCHNL RV IMPEDANCE VALUE: 573 Ohm
MDC IDC SET LEADCHNL RV PACING AMPLITUDE: 2.5 V
MDC IDC SET LEADCHNL RV PACING PULSEWIDTH: 0.4 ms
MDC IDC STAT BRADY AP VP PERCENT: 19 %
MDC IDC STAT BRADY AP VS PERCENT: 13 %
MDC IDC STAT BRADY AS VP PERCENT: 46 %

## 2015-08-02 ENCOUNTER — Observation Stay (HOSPITAL_COMMUNITY)
Admission: EM | Admit: 2015-08-02 | Discharge: 2015-08-03 | Disposition: A | Discharge status: Home / Self Care | Payer: Commercial Managed Care - HMO | Attending: Internal Medicine | Admitting: Internal Medicine

## 2015-08-02 ENCOUNTER — Emergency Department (HOSPITAL_COMMUNITY): Payer: Commercial Managed Care - HMO

## 2015-08-02 ENCOUNTER — Encounter (HOSPITAL_COMMUNITY): Payer: Self-pay | Admitting: Nurse Practitioner

## 2015-08-02 DIAGNOSIS — G458 Other transient cerebral ischemic attacks and related syndromes: Secondary | ICD-10-CM

## 2015-08-02 DIAGNOSIS — E876 Hypokalemia: Secondary | ICD-10-CM | POA: Diagnosis not present

## 2015-08-02 DIAGNOSIS — E669 Obesity, unspecified: Secondary | ICD-10-CM | POA: Insufficient documentation

## 2015-08-02 DIAGNOSIS — E785 Hyperlipidemia, unspecified: Secondary | ICD-10-CM | POA: Insufficient documentation

## 2015-08-02 DIAGNOSIS — Z7982 Long term (current) use of aspirin: Secondary | ICD-10-CM | POA: Diagnosis not present

## 2015-08-02 DIAGNOSIS — I25110 Atherosclerotic heart disease of native coronary artery with unstable angina pectoris: Secondary | ICD-10-CM | POA: Insufficient documentation

## 2015-08-02 DIAGNOSIS — Z7902 Long term (current) use of antithrombotics/antiplatelets: Secondary | ICD-10-CM | POA: Diagnosis not present

## 2015-08-02 DIAGNOSIS — Z950 Presence of cardiac pacemaker: Secondary | ICD-10-CM | POA: Diagnosis present

## 2015-08-02 DIAGNOSIS — I10 Essential (primary) hypertension: Secondary | ICD-10-CM | POA: Insufficient documentation

## 2015-08-02 DIAGNOSIS — I651 Occlusion and stenosis of basilar artery: Secondary | ICD-10-CM | POA: Diagnosis present

## 2015-08-02 DIAGNOSIS — G459 Transient cerebral ischemic attack, unspecified: Principal | ICD-10-CM | POA: Insufficient documentation

## 2015-08-02 DIAGNOSIS — Z6839 Body mass index (BMI) 39.0-39.9, adult: Secondary | ICD-10-CM | POA: Diagnosis not present

## 2015-08-02 DIAGNOSIS — Z79899 Other long term (current) drug therapy: Secondary | ICD-10-CM | POA: Diagnosis not present

## 2015-08-02 DIAGNOSIS — I441 Atrioventricular block, second degree: Secondary | ICD-10-CM | POA: Diagnosis not present

## 2015-08-02 DIAGNOSIS — R200 Anesthesia of skin: Secondary | ICD-10-CM | POA: Diagnosis not present

## 2015-08-02 DIAGNOSIS — I739 Peripheral vascular disease, unspecified: Secondary | ICD-10-CM | POA: Diagnosis not present

## 2015-08-02 DIAGNOSIS — Z8673 Personal history of transient ischemic attack (TIA), and cerebral infarction without residual deficits: Secondary | ICD-10-CM | POA: Insufficient documentation

## 2015-08-02 DIAGNOSIS — E039 Hypothyroidism, unspecified: Secondary | ICD-10-CM | POA: Diagnosis not present

## 2015-08-02 DIAGNOSIS — I2510 Atherosclerotic heart disease of native coronary artery without angina pectoris: Secondary | ICD-10-CM | POA: Diagnosis present

## 2015-08-02 DIAGNOSIS — Z955 Presence of coronary angioplasty implant and graft: Secondary | ICD-10-CM | POA: Insufficient documentation

## 2015-08-02 DIAGNOSIS — I639 Cerebral infarction, unspecified: Secondary | ICD-10-CM | POA: Diagnosis present

## 2015-08-02 HISTORY — DX: Transient cerebral ischemic attack, unspecified: G45.9

## 2015-08-02 LAB — COMPREHENSIVE METABOLIC PANEL
ALT: 18 U/L (ref 14–54)
AST: 22 U/L (ref 15–41)
Albumin: 4.1 g/dL (ref 3.5–5.0)
Alkaline Phosphatase: 59 U/L (ref 38–126)
Anion gap: 9 (ref 5–15)
BILIRUBIN TOTAL: 0.6 mg/dL (ref 0.3–1.2)
BUN: 16 mg/dL (ref 6–20)
CHLORIDE: 102 mmol/L (ref 101–111)
CO2: 24 mmol/L (ref 22–32)
Calcium: 10 mg/dL (ref 8.9–10.3)
Creatinine, Ser: 0.96 mg/dL (ref 0.44–1.00)
GFR, EST NON AFRICAN AMERICAN: 58 mL/min — AB (ref >60)
Glucose, Bld: 141 mg/dL — ABNORMAL HIGH (ref 65–99)
POTASSIUM: 2.8 mmol/L — AB (ref 3.5–5.1)
Sodium: 135 mmol/L (ref 135–145)
TOTAL PROTEIN: 6.7 g/dL (ref 6.5–8.1)

## 2015-08-02 LAB — CBC
HEMATOCRIT: 45.8 % (ref 36.0–46.0)
HEMOGLOBIN: 15.4 g/dL — AB (ref 12.0–15.0)
MCH: 29.8 pg (ref 26.0–34.0)
MCHC: 33.6 g/dL (ref 30.0–36.0)
MCV: 88.8 fL (ref 78.0–100.0)
Platelets: 222 10*3/uL (ref 150–400)
RBC: 5.16 MIL/uL — ABNORMAL HIGH (ref 3.87–5.11)
RDW: 13 % (ref 11.5–15.5)
WBC: 8 10*3/uL (ref 4.0–10.5)

## 2015-08-02 LAB — DIFFERENTIAL
BASOS ABS: 0 10*3/uL (ref 0.0–0.1)
BASOS PCT: 0 %
EOS ABS: 0.2 10*3/uL (ref 0.0–0.7)
EOS PCT: 3 %
LYMPHS ABS: 3.1 10*3/uL (ref 0.7–4.0)
Lymphocytes Relative: 39 %
MONO ABS: 0.4 10*3/uL (ref 0.1–1.0)
MONOS PCT: 5 %
Neutro Abs: 4.3 10*3/uL (ref 1.7–7.7)
Neutrophils Relative %: 53 %

## 2015-08-02 LAB — APTT: APTT: 27 s (ref 24–36)

## 2015-08-02 LAB — I-STAT CHEM 8, ED
BUN: 18 mg/dL (ref 6–20)
CALCIUM ION: 1.16 mmol/L (ref 1.12–1.23)
Chloride: 102 mmol/L (ref 101–111)
Creatinine, Ser: 0.9 mg/dL (ref 0.44–1.00)
Glucose, Bld: 139 mg/dL — ABNORMAL HIGH (ref 65–99)
HEMATOCRIT: 46 % (ref 36.0–46.0)
HEMOGLOBIN: 15.6 g/dL — AB (ref 12.0–15.0)
Potassium: 3 mmol/L — ABNORMAL LOW (ref 3.5–5.1)
SODIUM: 141 mmol/L (ref 135–145)
TCO2: 26 mmol/L (ref 0–100)

## 2015-08-02 LAB — CBG MONITORING, ED: Glucose-Capillary: 150 mg/dL — ABNORMAL HIGH (ref 65–99)

## 2015-08-02 LAB — I-STAT TROPONIN, ED: TROPONIN I, POC: 0 ng/mL (ref 0.00–0.08)

## 2015-08-02 LAB — PROTIME-INR
INR: 1.1
Prothrombin Time: 14.3 seconds (ref 11.4–15.2)

## 2015-08-02 MED ORDER — STROKE: EARLY STAGES OF RECOVERY BOOK
Freq: Once | Status: AC
  Administered 2015-08-02: 22:00:00
  Filled 2015-08-02: qty 1

## 2015-08-02 MED ORDER — POTASSIUM CHLORIDE CRYS ER 20 MEQ PO TBCR
40.0000 meq | EXTENDED_RELEASE_TABLET | ORAL | Status: AC
  Administered 2015-08-02 – 2015-08-03 (×2): 40 meq via ORAL
  Filled 2015-08-02 (×2): qty 2

## 2015-08-02 MED ORDER — FUROSEMIDE 40 MG PO TABS: 40.0000 mg | ORAL_TABLET | Freq: Every day | ORAL | Status: DC | PRN

## 2015-08-02 MED ORDER — RED YEAST RICE 600 MG PO CAPS: 600.0000 mg | ORAL_CAPSULE | Freq: Two times a day (BID) | ORAL | Status: DC

## 2015-08-02 MED ORDER — PHENAZOPYRIDINE HCL 100 MG PO TABS
95.0000 mg | ORAL_TABLET | Freq: Every day | ORAL | Status: DC | PRN
  Filled 2015-08-02: qty 1

## 2015-08-02 MED ORDER — NITROGLYCERIN 0.4 MG SL SUBL: 0.4000 mg | SUBLINGUAL_TABLET | SUBLINGUAL | Status: DC | PRN

## 2015-08-02 MED ORDER — PANTOPRAZOLE SODIUM 20 MG PO TBEC
20.0000 mg | DELAYED_RELEASE_TABLET | Freq: Every day | ORAL | Status: DC
  Administered 2015-08-03: 20 mg via ORAL
  Filled 2015-08-02: qty 1

## 2015-08-02 MED ORDER — SENNOSIDES-DOCUSATE SODIUM 8.6-50 MG PO TABS: 1.0000 | ORAL_TABLET | Freq: Every evening | ORAL | Status: DC | PRN

## 2015-08-02 MED ORDER — ASPIRIN 325 MG PO TABS
325.0000 mg | ORAL_TABLET | Freq: Every day | ORAL | Status: DC
  Administered 2015-08-02 – 2015-08-03 (×2): 325 mg via ORAL
  Filled 2015-08-02 (×2): qty 1

## 2015-08-02 MED ORDER — CLOPIDOGREL BISULFATE 75 MG PO TABS
75.0000 mg | ORAL_TABLET | Freq: Every day | ORAL | Status: DC
  Administered 2015-08-03: 75 mg via ORAL
  Filled 2015-08-02: qty 1

## 2015-08-02 MED ORDER — SODIUM CHLORIDE 0.9 % IV SOLN
INTRAVENOUS | Status: DC
  Administered 2015-08-02: 20:00:00 via INTRAVENOUS

## 2015-08-02 MED ORDER — VITAMIN D 1000 UNITS PO TABS
1000.0000 [IU] | ORAL_TABLET | Freq: Every day | ORAL | Status: DC
  Administered 2015-08-03: 1000 [IU] via ORAL
  Filled 2015-08-02: qty 1

## 2015-08-02 MED ORDER — LEVOTHYROXINE SODIUM 100 MCG PO TABS
100.0000 ug | ORAL_TABLET | Freq: Every day | ORAL | Status: DC
  Administered 2015-08-03: 100 ug via ORAL
  Filled 2015-08-02: qty 1

## 2015-08-02 MED ORDER — ENOXAPARIN SODIUM 40 MG/0.4ML ~~LOC~~ SOLN
40.0000 mg | SUBCUTANEOUS | Status: DC
  Administered 2015-08-02: 40 mg via SUBCUTANEOUS
  Filled 2015-08-02: qty 0.4

## 2015-08-02 MED ORDER — ASPIRIN 300 MG RE SUPP: 300.0000 mg | Freq: Every day | RECTAL | Status: DC

## 2015-08-02 NOTE — ED Notes (Signed)
Admitting at bedside 

## 2015-08-02 NOTE — H&P (Addendum)
TRH H&P   Patient Demographics:    Kelsey Dunn, is a 75 y.o. female  MRN: [id number]   DOB - 1940/09/20  Admit Date - 08/02/2015  Outpatient Primary MD for the patient is Crawford Givens, MD  Referring MD: Ivin Booty long  Outpatient Specialists:  Alfredo Batty  Patient coming from: Home  Chief Complaint  Patient presents with  . Code Stroke      HPI:    Kelsey Dunn  is a 75 y.o. female, With prior history of stroke in 2012, multiple TIAs, basilar artery stenosis (status post angioplasty), Mobitz type II heart block status post pacemaker in 2013, coronary artery disease with 70% stenosis of RCA with stent placement, occluded left common carotid artery with collaterals, hyperlipidemia, hypertension who was at [facility] ED with her brother when she suddenly developed right-sided facial numbness and numbness in her right hand. She thought it would pass away and went home from there. This was around 3:30 in the afternoon. However the symptoms persisted. She checked her blood pressure and was 170 systolic. She called EMS and when they checked her blood pressure was 180/90 mmHg. Patient denies any weakness in her extremities or fall. Patient denies any headache, blurred vision, dizziness, chest pain, palpitations, shortness of breath, abdominal pain, dysuria or diarrhea. Denies recent illness or change in the medications. Denies recent travel.  Vitals in the ED were stable except for mildly elevated blood pressure. Code stroke was called in the ED. Neuro hospitalist was consulted who recommended ED and given isolated sensory deficits she should be admitted on observation. Did not recommend TPA.  Labs showed WBC of 8, and 15.4, potassium of 2.8, normal renal function, blood glucose of 141. Head CT showed no acute abnormality small area of hypoattenuation in the subcortical left frontal lobe  which could represent a small subacute lacunar infarct. On my evaluation patient still has some numbness in the right side of the face and ventral aspect of her right. Patient reports being compliant with her medications.    Review of systems:    In addition to the HPI above,  No Fever-chills, No Headache, No changes with Vision or hearing, No problems swallowing food or Liquids, No Chest pain, Cough or Shortness of Breath, No Abdominal pain, No Nausea or Vommitting, Bowel movements are regular, No Blood in stool or Urine, No dysuria, No new skin rashes or bruises, No new joints pains-aches,  No new weakness, Numbness in right side of the face and right palm No recent weight gain or loss, No polyuria, polydypsia or polyphagia, No significant Mental Stressors.  A full 10 point Review of Systems was done, except as stated above, all other Review of Systems were negative.   With Past History of the following :    Past Medical History:  Diagnosis Date  . Arthritis    "fingers" (02/27/2013)  .  Basilar artery stenosis    90%  . Bladder spasms   . Blood transfusion 1960's   w/childbirth  . CAD (coronary artery disease)    a. 06/2009 cath: severe stenosis RCA with placement drug eluting stent;  b. 08/2011 cath: normal left main, luminal LAD irregularities, luminal LCx irregularities, luminal RCA irregularities and no ISR to mid-RCA DES, LVEF 60-65%; c. 02/2013 nonischemic CL, EF 79%.  . CVA (cerebral infarction) December 2012  . Family history of anesthesia complication    "first cousin had very fever and almost died" (03/13/2013)  . Fibromyalgia   . HLD (hyperlipidemia) 01/1997  . Hypertension   . Hypothyroidism 1990  . IC (interstitial cystitis)   . Labile hypertension 08/1991  . Left-sided carotid artery obstruction   . Malignant hyperthermia    "told to warn everybody before I had surgery that my 1st cousin had malignant hyperthermia; I've had symptoms of it too" (13-Mar-2013)    . Obesity   . Pacemaker 11/11/2011   dual chamber pacemaker  . Pneumonia    "twice" (03/13/13)  . Second degree Mobitz II AV block   . Stroke [facility])    "they tell me I've had some mini strokes; nothing that's bothered me though" (March 13, 2013)      Past Surgical History:  Procedure Laterality Date  . basilar artery angioplasty  09/04/08   sm right brachial hematoma  . Bilateral Vert & Subclavian Angiogram  07/19/08   85-90% stenosis mid basilar art; occluded left vert artery; occluded left comm carotid artery w/ collat flow  . BLADDER SUSPENSION  1974  . CARDIAC CATHETERIZATION  1993   normal  . carotid arteriogram     bilat  . Carotid US  11/93   Occl L comm carotid  . Carotid US  4/98   L comm occl R-ok  . Carotid US  01/30/03   L occl  R less 40%  . CATARACT EXTRACTION W/ INTRAOCULAR LENS IMPLANT Bilateral 2007-2008  . Cath single vess dz  06/13/09   70% stenosis RCA EF 65-70%  . CHOLECYSTECTOMY  1985  . CORONARY ANGIOPLASTY WITH STENT PLACEMENT  06/19/09  . DEXA  10/15/04   Troch -0.4 o/w pos  . EMG  1/01   LE, RUE normal  . EYE SURGERY    . INSERT / REPLACE / REMOVE PACEMAKER  11/11/2011   MDT Adapta L implanted by Dr Johney Frame. [employer]  . LEFT HEART CATHETERIZATION WITH CORONARY ANGIOGRAM N/A 08/10/2011   Procedure: LEFT HEART CATHETERIZATION WITH CORONARY ANGIOGRAM;  Surgeon: Kathleene Hazel, MD;  Location: [facility] CATH LAB;  Service: Cardiovascular;  Laterality: N/A;  . LEFT HEART CATHETERIZATION WITH CORONARY ANGIOGRAM N/A 07/28/2013   Procedure: LEFT HEART CATHETERIZATION WITH CORONARY ANGIOGRAM;  Surgeon: Kathleene Hazel, MD;  Location: [facility] CATH LAB;  Service: Cardiovascular;  Laterality: N/A;  . PERMANENT PACEMAKER INSERTION N/A 11/11/2011   Procedure: PERMANENT PACEMAKER INSERTION;  Surgeon: Hillis Range, MD;  Location: [facility] CATH LAB;  Service: Cardiovascular;  Laterality: N/A;  . RENAL ANGIOPLASTY  1993   normal  . US RENAL/AORTA  12/93   Normal  . VAGINAL  HYSTERECTOMY  1974   dysmennorhea  . Visual evoked response  11/93; 1/01   normal      Social History:     Social History  Substance Use Topics  . Smoking status: Never Smoker  . Smokeless tobacco: Never Used  . Alcohol use No     Lives - At home with her husband  Mobility - ambulatory     Family History :     Family History  Problem Relation Age of Onset  . Lung cancer Father   . Heart attack Mother   . Lymphoma Mother     s/p chemo  . Stroke Mother     x 2  . Diabetes Brother   . Breast cancer Maternal Aunt   . Colon cancer Neg Hx       Home Medications:   Prior to Admission medications   Medication Sig Start Date End Date Taking? Authorizing Provider  amLODipine (NORVASC) 5 MG tablet Take 1 tablet (5 mg total) by mouth daily. 06/04/15   Joaquim Nam, MD  aspirin EC 81 MG tablet Take 81 mg by mouth every morning.    Historical Provider, MD  Cholecalciferol ([employer] VITAMIN D3) 1000 units capsule Take 1 capsule (1,000 Units total) by mouth daily. 06/04/15   Joaquim Nam, MD  clopidogrel (PLAVIX) 75 MG tablet Take 1 tablet (75 mg total) by mouth daily. 06/04/15   Joaquim Nam, MD  furosemide (LASIX) 40 MG tablet Take 40 mg by mouth daily as needed for edema.    Historical Provider, MD  lansoprazole (PREVACID) 15 MG capsule Take 15 mg by mouth daily as needed (reflux).     Historical Provider, MD  levothyroxine (SYNTHROID, LEVOTHROID) 100 MCG tablet Take 1 tablet (100 mcg total) by mouth daily. 06/04/15   Joaquim Nam, MD  losartan-hydrochlorothiazide (HYZAAR) 100-25 MG tablet Take 1 tablet by mouth daily. 06/04/15   Joaquim Nam, MD  nitroGLYCERIN (NITROSTAT) 0.4 MG SL tablet Place 0.4 mg under the tongue every 5 (five) minutes as needed (MAX 3 TABLETS). For chest pain    Historical Provider, MD  Phenazopyridine HCl (AZO TABS PO) Take 1 tablet by mouth daily as needed (bladder spasms).     Historical Provider, MD  Red Yeast Rice 600 MG CAPS Take 600 mg by  mouth 2 (two) times daily.    Historical Provider, MD     Allergies:     Allergies  Allergen Reactions  . Anesthetics, Amide Other (See Comments)    REACTION:FEVER, SYNCOPE  . Codeine Nausea And Vomiting    Reaction:Hallucinations  . Nitrofurantoin Nausea Only and Other (See Comments)    REACTION: Hallucinations  . Orphenadrine Citrate Nausea And Vomiting and Other (See Comments)    REACTION: Hallucination  . Pravastatin Other (See Comments)    Myalgias  . Simvastatin Other (See Comments)    myalgias  . Pentosan Polysulfate Sodium Rash and Other (See Comments)    REACTION: rash/ severe headache     Physical Exam:   Vitals  Blood pressure 156/77, pulse 65, temperature 98.6 F (37 C), resp. rate 11, SpO2 98 %.   Gen.: Elderly female not in distress HEENT: No pallor, moist mucosa, supple neck Chest: Clear to auscultation bilaterally on CVS: Normal S1 and S2, no murmurs rub or gallop GI: Soft, nondistended, nontender, bowel sounds present Muscular skeletal: Warm, trace edema CNS: Alert and oriented, creatinine is 2-12 intact, normal motor tone and power and reflex bilaterally, diminished sensation in the ventral aspect of the right palm, diminished sensation in the right side of the face, no facial droop   Data Review:    CBC  Recent Labs Lab 08/02/15 1709 08/02/15 1724  WBC  --  8.0  HGB 15.6* 15.4*  HCT 46.0 45.8  PLT  --  222  MCV  --  88.8  MCH  --  29.8  MCHC  --  33.6  RDW  --  13.0  LYMPHSABS  --  3.1  MONOABS  --  0.4  EOSABS  --  0.2  BASOSABS  --  0.0   ------------------------------------------------------------------------------------------------------------------  Chemistries   Recent Labs Lab 08/02/15 1709 08/02/15 1724  NA 141 135  K 3.0* 2.8*  CL 102 102  CO2  --  24  GLUCOSE 139* 141*  BUN 18 16  CREATININE 0.90 0.96  CALCIUM  --  10.0  AST  --  22  ALT  --  18  ALKPHOS  --  59  BILITOT  --  0.6    ------------------------------------------------------------------------------------------------------------------ CrCl cannot be calculated (Unknown ideal weight.). ------------------------------------------------------------------------------------------------------------------ No results for input(s): TSH, T4TOTAL, T3FREE, THYROIDAB in the last 72 hours.  Invalid input(s): FREET3  Coagulation profile  Recent Labs Lab 08/02/15 1724  INR 1.10   ------------------------------------------------------------------------------------------------------------------- No results for input(s): DDIMER in the last 72 hours. -------------------------------------------------------------------------------------------------------------------  Cardiac Enzymes No results for input(s): CKMB, TROPONINI, MYOGLOBIN in the last 168 hours.  Invalid input(s): CK ------------------------------------------------------------------------------------------------------------------ No results found for: BNP   ---------------------------------------------------------------------------------------------------------------  Urinalysis    Component Value Date/Time   COLORURINE YELLOW 03/26/2014 1330   APPEARANCEUR CLEAR 03/26/2014 1330   LABSPEC 1.006 03/26/2014 1330   PHURINE 6.5 03/26/2014 1330   GLUCOSEU NEGATIVE 03/26/2014 1330   GLUCOSEU NEGATIVE 11/09/2011 0814   HGBUR NEGATIVE 03/26/2014 1330   HGBUR trace-intact 04/24/2009 1535   BILIRUBINUR Neg 06/04/2015 1453   KETONESUR NEGATIVE 03/26/2014 1330   PROTEINUR Neg 06/04/2015 1453   PROTEINUR NEGATIVE 03/26/2014 1330   UROBILINOGEN 4.0 06/04/2015 1453   UROBILINOGEN 0.2 03/26/2014 1330   NITRITE Neg 06/04/2015 1453   NITRITE NEGATIVE 03/26/2014 1330   LEUKOCYTESUR Negative 06/04/2015 1453    ----------------------------------------------------------------------------------------------------------------   Imaging Results:    Ct Head Code  Stroke W/o Cm  Result Date: 08/02/2015 CLINICAL DATA:  Right-sided facial numbness and drawing. EXAM: CT HEAD WITHOUT CONTRAST TECHNIQUE: Contiguous axial images were obtained from the base of the skull through the vertex without intravenous contrast. COMPARISON:  CT of the head 03/26/2014 FINDINGS: No mass effect or midline shift. No evidence of acute intracranial hemorrhage, or infarction. Sub cm focus of hypoattenuation in the subcortical left frontal lobe is seen with uncertain significance. No abnormal extra-axial fluid collections. Gray-white matter differentiation is otherwise normal. Basal cisterns are preserved. Atherosclerotic vascular calcifications are noted at the skullbase. No depressed skull fractures. Visualized paranasal sinuses and mastoid air cells are not opacified. IMPRESSION: No acute intracranial abnormality. Small area of hypoattenuation in the subcortical left frontal lobe which may represent dilated perivascular space versus a small subacute lacunar infarct. Electronically Signed   By: Ted Mcalpine M.D.   On: 08/02/2015 17:03   My personal review of EKG: Pending  Assessment & Plan:    Principal Problem:   TIA (transient ischemic attack)Versus stroke involution Admit under observation on neuro telemetry. Has recurrent TIA symptoms. Occluded left common carotid with collaterals. Neurochecks every 4 hours. Stroke pathway initiated. Patient cannot get MRI due to pacemaker. Keep nothing by mouth until clear for bedside swallow. Official swallow evaluation in the morning. Would place her on full dose aspirin and resume her Plavix. Allow permissive blood pressure. 2-D echo and carotid Dopplers. Check A1c and lipid panel in the morning. (Patient allergic to statin)  PT/OT eval. SLP eval. Further recommendations per her neurologist.  Active Problems: Hypokalemia Replenish with by mouth potassium. Check magnesium level  Essential hypertension Blood pressure mildly  elevated. Allow permissive hypertension     Occlusion and stenosis of basilar artery Follows with Dr. Harrie Foreman For Mobitz type II block. Follows with Dr. Johney Frame.   Peripheral vascular disease On Plavix.       DVT Prophylaxis: Subcutaneous Lovenox  AM Labs Ordered, also please review Full Orders  Family Communication: Discussed with patient and her husband at bedside in detail   Code Status full code   Likely DC to  home once workup completed   Condition: stable  Consults called: neurohospitalist   Admission status: obs Time spent in minutes : telemetry   Eddie North M.D on 08/02/2015 at 6:37 PM  Between 7am to 7pm - Pager - [phone]. After 7pm go to [email or web address] - password [email or web address]  [facility] - Office  [phone]

## 2015-08-02 NOTE — ED Provider Notes (Signed)
Emergency Department Provider Note   I have reviewed the triage vital signs and the nursing notes.   HISTORY  Chief Complaint Code Stroke   HPI Kelsey Dunn is a 75 y.o. female with PMH of CVA s/p basilar artery stenting, CAD, HLD, HTN, and pacemaker who presents to the emergency department for evaluation of sudden onset facial asymmetry. The patient reports being in the waiting room at [facility] with a family member who is going to the emergency department. She noticed symptom onset at 3:30 PM. The patient is sure because she checked her watch. She did not immediately appreciate any other weakness or numbness. No voice changes or difficulty swallowing. The patient left the emergency department waiting room and drove home. From there, her husband drove her to this emergency department. She denies any worsening symptoms. No associated trauma. No vision changes.   Past Medical History:  Diagnosis Date  . Arthritis    "fingers" (March 07, 2013)  . Basilar artery stenosis    90%  . Bladder spasms   . Blood transfusion 1960's   w/childbirth  . CAD (coronary artery disease)    a. 06/2009 cath: severe stenosis RCA with placement drug eluting stent;  b. 08/2011 cath: normal left main, luminal LAD irregularities, luminal LCx irregularities, luminal RCA irregularities and no ISR to mid-RCA DES, LVEF 60-65%; c. 02/2013 nonischemic CL, EF 79%.  . CVA (cerebral infarction) December 2012  . Family history of anesthesia complication    "first cousin had very fever and almost died" (03/07/13)  . Fibromyalgia   . HLD (hyperlipidemia) 01/1997  . Hypertension   . Hypothyroidism 1990  . IC (interstitial cystitis)   . Labile hypertension 08/1991  . Left-sided carotid artery obstruction   . Malignant hyperthermia    "told to warn everybody before I had surgery that my 1st cousin had malignant hyperthermia; I've had symptoms of it too" (Mar 07, 2013)  . Obesity   . Pacemaker  11/11/2011   dual chamber pacemaker  . Pneumonia    "twice" (2013/03/07)  . Second degree Mobitz II AV block   . Stroke [place])    "they tell me I've had some mini strokes; nothing that's bothered me though" (07-Mar-2013)    Patient Active Problem List   Diagnosis Date Noted  . Tick bite 06/05/2015  . Dysuria 06/05/2015  . Neck pain on right side 10/01/2014  . Transient global amnesia 03/26/2014  . Memory loss   . Shortness of breath on exertion 08/06/2013  . Eyelid abnormality 06/23/2013  . Dysphagia, unspecified(787.20) 04/13/2013  . Osteopenia 03/11/2013  . Medicare annual wellness visit, initial 02/21/2013  . Irritation of eyelid 05/01/2012  . Metabolic syndrome 02/17/2012  . Obesity, unspecified 02/17/2012  . Impaired exercise tolerance 02/17/2012  . Fall at home 12/21/2011  . Heartburn 12/10/2011  . Pacemaker-[employer]tron[employer] 11/13/2011  . Second degree Mobitz II AV block 10/30/2011  . Unstable angina (HCC) 08/10/2011  . Heart block AV second degree 08/10/2011  . Dizziness 12/23/2010  . Hypothyroidism 08/07/2009  . CAD, NATIVE VESSEL 07/04/2009  . PVD 06/10/2009  . DYSPNEA 06/10/2009  . CHEST PAIN-PRECORDIAL 06/10/2009  . FOOT PAIN, LEFT 02/18/2009  . Occlusion and stenosis of basilar artery 07/12/2008  . Essential hypertension 07/10/2008  . GAIT DISTURBANCE 07/10/2008  . Vitamin D deficiency 06/28/2008  . HYPERGLYCEMIA 06/28/2008  . Pure hypercholesterolemia 12/28/2006  . COMMON MIGRAINE 04/28/2006  . DETACHED RETINA 04/28/2006  . MENOPAUSAL SYNDROME 04/28/2006  . FIBROMYALGIA 04/28/2006    Past  Surgical History:  Procedure Laterality Date  . basilar artery angioplasty  09/04/08   sm right brachial hematoma  . Bilateral Vert & Subclavian Angiogram  07/19/08   85-90% stenosis mid basilar art; occluded left vert artery; occluded left comm carotid artery w/ collat flow  . BLADDER SUSPENSION  1974  . CARDIAC CATHETERIZATION  1993   normal  . carotid arteriogram      bilat  . Carotid US  11/93   Occl L comm carotid  . Carotid US  4/98   L comm occl R-ok  . Carotid US  01/30/03   L occl  R less 40%  . CATARACT EXTRACTION W/ INTRAOCULAR LENS IMPLANT Bilateral 2007-2008  . Cath single vess dz  06/13/09   70% stenosis RCA EF 65-70%  . CHOLECYSTECTOMY  1985  . CORONARY ANGIOPLASTY WITH STENT PLACEMENT  06/19/09  . DEXA  10/15/04   Troch -0.4 o/w pos  . EMG  1/01   LE, RUE normal  . EYE SURGERY    . INSERT / REPLACE / REMOVE PACEMAKER  11/11/2011   MDT Adapta L implanted by Dr Johney Frame. [employer]  . LEFT HEART CATHETERIZATION WITH CORONARY ANGIOGRAM N/A 08/10/2011   Procedure: LEFT HEART CATHETERIZATION WITH CORONARY ANGIOGRAM;  Surgeon: Kathleene Hazel, MD;  Location: [facility] C[facility] LAB;  Service: Cardiovascular;  Laterality: N/A;  . LEFT HEART CATHETERIZATION WITH CORONARY ANGIOGRAM N/A 07/28/2013   Procedure: LEFT HEART CATHETERIZATION WITH CORONARY ANGIOGRAM;  Surgeon: Kathleene Hazel, MD;  Location: [facility] CATH LAB;  Service: Cardiovascular;  Laterality: N/A;  . PERMANENT PACEMAKER INSERTION N/A 11/11/2011   Procedure: PERMANENT PACEMAKER INSERTION;  Surgeon: Hillis Range, MD;  Location: [facility] CATH LAB;  Service: Cardiovascular;  Laterality: N/A;  . RENAL ANGIOPLASTY  1993   normal  . US RENAL/AORTA  12/93   Normal  . VAGINAL HYSTERECTOMY  1974   dysmennorhea  . Visual evoked response  11/93; 1/01   normal    Current Outpatient Rx  . Order #: [id number] Class: Normal  . Order #: [id number] Class: Historical Med  . Order #: [id number] Class: No Print  . Order #: [id number] Class: Normal  . Order #: [id number] Class: Historical Med  . Order #: [id number] Class: Historical Med  . Order #: [id number] Class: Normal  . Order #: [id number] Class: Normal  . Order #: [id number] Class: Historical Med  . Order #: [id number] Class: Historical Med  . Order #: [id number] Class: Historical Med    Allergies Anesthetics, amide; Codeine; Nitrofurantoin; Orphenadrine citrate;  Pravastatin; Simvastatin; and Pentosan polysulfate sodium  Family History  Problem Relation Age of Onset  . Lung cancer Father   . Heart attack Mother   . Lymphoma Mother     s/p chemo  . Stroke Mother     x 2  . Diabetes Brother   . Colon cancer Neg Hx   . Breast cancer Maternal Aunt     Social History Social History  Substance Use Topics  . Smoking status: Never Smoker  . Smokeless tobacco: Never Used  . Alcohol use No    Review of Systems  Constitutional: No fever/chills Eyes: No visual changes. ENT: No sore throat. Cardiovascular: Denies chest pain. Respiratory: Denies shortness of breath. Gastrointestinal: No abdominal pain.  No nausea, no vomiting.  No diarrhea.  No constipation. Genitourinary: Negative for dysuria. Musculoskeletal: Negative for back pain. Skin: Negative for rash. Neurological: Negative for headaches. Positive right face weakness and right upper extremity numbness/weakness.   10-point ROS otherwise negative.  ____________________________________________   PHYSICAL EXAM:  VITAL SIGNS: Temp: 98.3 F RR: 21 SpO2: 98% Pulse: 88 BP: 122/75  Constitutional: Alert and oriented. Well appearing and in no acute distress. Eyes: Conjunctivae are normal. PERRL. EOMI. Head: Atraumatic. Nose: No congestion/rhinnorhea. Mouth/Throat: Mucous membranes are moist.  Oropharynx non-erythematous. Neck: No stridor. Cardiovascular: Normal rate, regular rhythm. Good peripheral circulation. Grossly normal heart sounds.   Respiratory: Normal respiratory effort.  No retractions. Lungs CTAB. Gastrointestinal: Soft and nontender. No distention.  Musculoskeletal: No lower extremity tenderness nor edema. No gross deformities of extremities. Neurologic:  Normal speech and language. Right face asymmetry with decreased sensation to light touch. Normal strength throughout. Decreased sensation to light touch in RUE.  Skin:  Skin is warm, dry and intact. No rash  noted. Psychiatric: Mood and affect are normal. Speech and behavior are normal.  ____________________________________________   LABS (all labs ordered are listed, but only abnormal results are displayed)  Labs Reviewed  CBC - Abnormal; Notable for the following:       Result Value   RBC 5.16 (*)    Hemoglobin 15.4 (*)    All other components within normal limits  COMPREHENSIVE METABOLIC PANEL - Abnormal; Notable for the following:    Potassium 2.8 (*)    Glucose, Bld 141 (*)    GFR calc non Af Amer 58 (*)    All other components within normal limits  CBG MONITORING, ED - Abnormal; Notable for the following:    Glucose-Capillary 150 (*)    All other components within normal limits  I-STAT CHEM 8, ED - Abnormal; Notable for the following:    Potassium 3.0 (*)    Glucose, Bld 139 (*)    Hemoglobin 15.6 (*)    All other components within normal limits  PROTIME-INR  APTT  DIFFERENTIAL  I-STAT TROPOININ, ED   ____________________________________________  EKG  Paced rhythm. No STEMI.  ____________________________________________  RADIOLOGY  No results found.  ____________________________________________   PROCEDURES  Procedure(s) performed:   Procedures  None ____________________________________________   INITIAL IMPRESSION / ASSESSMENT AND PLAN / ED COURSE  Pertinent labs & imaging results that were available during my care of the patient were reviewed by me and considered in my medical decision making (see chart for details).  Patient with past medical history of stroke presents to the emergency department as a code stroke. She has right face and right upper extremity numbness. No dysarthria. Blood pressure 122/75. Neurology called as part of code stroke activation.   05:18 PM Spoke with Dr. Hilda Blades with Neurology after evaluation. Isolated sensory deficit on his exam. He will write a note. No tPA. Patient cannot get an MRI due to having a pacemaker. They  recommend observational admission. No additional intervention at this time.   06:16 PM  Discussed patient's case with Hospitalist, Dr. Gonzella Lex.  Recommend admission to obesrvation, telemetry bed.  I will place holding orders per their request. Patient and family (if present) updated with plan. Care transferred to Hospitalist service.  I reviewed all nursing notes, vitals, pertinent old records, EKGs, labs, imaging (as available).  ____________________________________________  FINAL CLINICAL IMPRESSION(S) / ED DIAGNOSES  Final diagnoses:  Numbness     MEDICATIONS GIVEN DURING THIS VISIT:  None  NEW OUTPATIENT MEDICATIONS STARTED DURING THIS VISIT:  None   Note:  This document was prepared using Dragon voice recognition software and may include unintentional dictation errors.  Alona Bene, MD Emergency Medicine   Maia Plan, MD 08/02/15 [phone]

## 2015-08-02 NOTE — Progress Notes (Signed)
PHARMACIST - PHYSICIAN ORDER COMMUNICATION  CONCERNING: P&T Medication Policy on Herbal Medications  DESCRIPTION:  This patient's order for:  Red Yeast Rice  has been noted.  This product(s) is classified as an "herbal" or natural product. Due to a lack of definitive safety studies or FDA approval, nonstandard manufacturing practices, plus the potential risk of unknown drug-drug interactions while on inpatient medications, the Pharmacy and Therapeutics Committee does not permit the use of "herbal" or natural products of this type within [facility].   ACTION TAKEN: The pharmacy department is unable to verify this order at this time and your patient has been informed of this safety policy. Please reevaluate patient's clinical condition at discharge and address if the herbal or natural product(s) should be resumed at that time.  Elige Ko, Pharm.D., BCPS Clinical Pharmacist 08/02/2015 7:55 PM

## 2015-08-02 NOTE — ED Triage Notes (Addendum)
Pt sts was in ER waiting room at [place] today at 3:30pm and noted sudden onset of weakness on right side of mouth. Patient endorses right sided hand numbness and tingling. Patient had husband drive her to [facility] ER. Pt alert and oriented.   Pt brought to CT by triage RN brooke and accompanied to this room by Chancy Milroy RN

## 2015-08-02 NOTE — ED Notes (Signed)
Report given to Susanna RN.

## 2015-08-02 NOTE — Consult Note (Signed)
Initial Neurological Consultation                      NEURO HOSPITALIST CONSULT NOTE   Requestig physician: Dr. Jacqulyn Bath    Reason for Consult:  Numbness of the right face and right upper extremity.  HPI:                                                                                                                                          Kelsey Dunn is a lovely 75 year old patient who presents with a history of numbness in the right side of the face and right upper extremity that has been present for several hours. Kelsey Dunn describes no other cranial nerve symptoms and she reports no focal weakness she's describing no issues with her coordination or balance.  Kelsey Dunn is known to the neurology service as she has a history of basilar artery stenosis. This is status post angioplasty. She also has a history of an occluded left common carotid artery. She has a number of known risk factors for stroke including a prior history of stroke, hypertension, hyperlipidemia, arrhythmia, and coronary artery disease.  Kelsey Dunn has already undergone CT of the brain. This was read as being unremarkable with the exception of possibly a small enlarged perivascular space in the left frontal region. The NIH SS score was felt to be 2.  Kelsey Dunn reports that Kelsey Dunn requested to be notified any time she is in the hospital for possible ischemic change. Kelsey Dunn was in fact contacted. Kelsey Dunn medical condition was explained. Kelsey recommendation was 24 hour observation.       Past Medical History:  Diagnosis Date  . Arthritis    "fingers" (03-24-13)  . Basilar artery stenosis    90%  . Bladder spasms   . Blood transfusion 1960's   w/childbirth  . CAD (coronary artery disease)    a. 06/2009 cath: severe stenosis RCA with placement drug eluting stent;  b. 08/2011 cath: normal left main, luminal LAD irregularities, luminal LCx irregularities, luminal RCA irregularities and no ISR to mid-RCA DES, LVEF 60-65%; c.  02/2013 nonischemic CL, EF 79%.  . CVA (cerebral infarction) December 2012  . Family history of anesthesia complication    "first cousin had very fever and almost died" (03-24-13)  . Fibromyalgia   . HLD (hyperlipidemia) 01/1997  . Hypertension   . Hypothyroidism 1990  . IC (interstitial cystitis)   . Labile hypertension 08/1991  . Left-sided carotid artery obstruction   . Malignant hyperthermia    "told to warn everybody before I had surgery that my 1st cousin had malignant hyperthermia; I've had symptoms of it too" (2013-03-24)  . Obesity   . Pacemaker 11/11/2011   dual chamber pacemaker  . Pneumonia    "twice" (24-Mar-2013)  . Second degree Mobitz II AV block   . Stroke [facility])    "they tell me I've  had some mini strokes; nothing that's bothered me though" (02/27/2013)    Past Surgical History:  Procedure Laterality Date  . basilar artery angioplasty  09/04/08   sm right brachial hematoma  . Bilateral Vert & Subclavian Angiogram  07/19/08   85-90% stenosis mid basilar art; occluded left vert artery; occluded left comm carotid artery w/ collat flow  . BLADDER SUSPENSION  1974  . CARDIAC CATHETERIZATION  1993   normal  . carotid arteriogram     bilat  . Carotid US  11/93   Occl L comm carotid  . Carotid US  4/98   L comm occl R-ok  . Carotid US  01/30/03   L occl  R less 40%  . CATARACT EXTRACTION W/ INTRAOCULAR LENS IMPLANT Bilateral 2007-2008  . Cath single vess dz  06/13/09   70% stenosis RCA EF 65-70%  . CHOLECYSTECTOMY  1985  . CORONARY ANGIOPLASTY WITH STENT PLACEMENT  06/19/09  . DEXA  10/15/04   Troch -0.4 o/w pos  . EMG  1/01   LE, RUE normal  . EYE SURGERY    . INSERT / REPLACE / REMOVE PACEMAKER  11/11/2011   MDT Adapta L implanted by Dr Johney Frame. [employer]  . LEFT HEART CATHETERIZATION WITH CORONARY ANGIOGRAM N/A 08/10/2011   Procedure: LEFT HEART CATHETERIZATION WITH CORONARY ANGIOGRAM;  Surgeon: Kathleene Hazel, MD;  Location: [facility] CATH LAB;  Service:  Cardiovascular;  Laterality: N/A;  . LEFT HEART CATHETERIZATION WITH CORONARY ANGIOGRAM N/A 07/28/2013   Procedure: LEFT HEART CATHETERIZATION WITH CORONARY ANGIOGRAM;  Surgeon: Kathleene Hazel, MD;  Location: [facility] CATH LAB;  Service: Cardiovascular;  Laterality: N/A;  . PERMANENT PACEMAKER INSERTION N/A 11/11/2011   Procedure: PERMANENT PACEMAKER INSERTION;  Surgeon: Hillis Range, MD;  Location: [facility] CATH LAB;  Service: Cardiovascular;  Laterality: N/A;  . RENAL ANGIOPLASTY  1993   normal  . US RENAL/AORTA  12/93   Normal  . VAGINAL HYSTERECTOMY  1974   dysmennorhea  . Visual evoked response  11/93; 1/01   normal    MEDICATIONS:                                                                                                                     I have reviewed the patient's current medications.  Allergies  Allergen Reactions  . Anesthetics, Amide Other (See Comments)    REACTION:FEVER, SYNCOPE  . Codeine Nausea And Vomiting    Reaction:Hallucinations  . Nitrofurantoin Nausea Only and Other (See Comments)    REACTION: Hallucinations  . Orphenadrine Citrate Nausea And Vomiting and Other (See Comments)    REACTION: Hallucination  . Pravastatin Other (See Comments)    Myalgias  . Simvastatin Other (See Comments)    myalgias  . Pentosan Polysulfate Sodium Rash and Other (See Comments)    REACTION: rash/ severe headache     Social History:  reports that she has never smoked. She has never used smokeless tobacco. She reports that she does not drink alcohol or use drugs.  Family History  Problem Relation Age of Onset  . Lung cancer Father   . Heart attack Mother   . Lymphoma Mother     s/p chemo  . Stroke Mother     x 2  . Diabetes Brother   . Breast cancer Maternal Aunt   . Colon cancer Neg Hx      ROS:                                                                                                                                       History obtained from chart  review  General ROS: negative for - chills, fatigue, fever, night sweats, weight gain or weight loss Psychological ROS: negative for - behavioral disorder, hallucinations, memory difficulties, mood swings or suicidal ideation Ophthalmic ROS: negative for - blurry vision, double vision, eye pain or loss of vision ENT ROS: negative for - epistaxis, nasal discharge, oral lesions, sore throat, tinnitus or vertigo Allergy and Immunology ROS: negative for - hives or itchy/watery eyes Hematological and Lymphatic ROS: negative for - bleeding problems, bruising or swollen lymph nodes Endocrine ROS: negative for - galactorrhea, hair pattern changes, polydipsia/polyuria or temperature intolerance Respiratory ROS: negative for - cough, hemoptysis, shortness of breath or wheezing Cardiovascular ROS: negative for - chest pain, dyspnea on exertion, edema or irregular heartbeat Gastrointestinal ROS: negative for - abdominal pain, diarrhea, hematemesis, nausea/vomiting or stool incontinence Genito-Urinary ROS: negative for - dysuria, hematuria, incontinence or urinary frequency/urgency Musculoskeletal ROS: negative for - joint swelling or muscular weakness Neurological ROS: as noted in HPI Dermatological ROS: negative for rash and skin lesion changes   General Exam                                                                                                      Blood pressure 160/63, pulse 79, temperature 98.3 F (36.8 C), temperature source Oral, resp. rate 22, SpO2 95 %. HEENT-  Normocephalic, no lesions, without obvious abnormality.  Normal external eye and conjunctiva.  Normal TM's bilaterally.  Normal auditory canals and external ears. Normal external nose, mucus membranes and septum.  Normal pharynx. Cardiovascular- regular rate and rhythm, S1, S2 normal, no murmur, click, rub or gallop, pulses palpable throughout   Lungs- chest clear, no wheezing, rales, normal symmetric air entry, Heart exam - S1,  S2 normal, no murmur, no gallop, rate regular Abdomen- soft, non-tender; bowel sounds normal; no masses,  no organomegaly Extremities- less then 2 second capillary refill Lymph-no adenopathy palpable  Musculoskeletal-no joint tenderness, deformity or swelling Skin-warm and dry, no hyperpigmentation, vitiligo, or suspicious lesions  Neurological Examination Mental Status: Alert, oriented, thought content appropriate.  Speech fluent without evidence of aphasia.  Able to follow 3 step commands without difficulty. Cranial Nerves: II: Discs flat bilaterally; Visual fields grossly normal, pupils equal, round, reactive to light and accommodation III,IV, VI: ptosis not present, extra-ocular motions intact bilaterally V,VII: smile symmetric, facial light touch sensation normal bilaterally VIII: hearing normal bilaterally IX,X: uvula rises symmetrically XI: bilateral shoulder shrug XII: midline tongue extension Motor: Right : Upper extremity   5/5    Left:     Upper extremity   5/5  Lower extremity   5/5     Lower extremity   5/5 Tone and bulk:normal tone throughout; no atrophy noted Sensory: Pinprick and light touch intact throughout, bilaterally Deep Tendon Reflexes: 2+ and symmetric throughout Plantars: Right: downgoing   Left: downgoing Cerebellar: normal finger-to-nose, normal rapid alternating movements and normal heel-to-shin test Gait: normal gait and station      Lab Results: Basic Metabolic Panel:  Recent Labs Lab 08/02/15 1709  NA 141  K 3.0*  CL 102  GLUCOSE 139*  BUN 18  CREATININE 0.90    Liver Function Tests: No results for input(s): AST, ALT, ALKPHOS, BILITOT, PROT, ALBUMIN in the last 168 hours. No results for input(s): LIPASE, AMYLASE in the last 168 hours. No results for input(s): AMMONIA in the last 168 hours.  CBC:  Recent Labs Lab 08/02/15 1709 08/02/15 1724  WBC  --  8.0  NEUTROABS  --  4.3  HGB 15.6* 15.4*  HCT 46.0 45.8  MCV  --  88.8  PLT   --  222    Cardiac Enzymes: No results for input(s): CKTOTAL, CKMB, CKMBINDEX, TROPONINI in the last 168 hours.  Lipid Panel: No results for input(s): CHOL, TRIG, HDL, CHOLHDL, VLDL, LDLCALC in the last 168 hours.  CBG:  Recent Labs Lab 08/02/15 1718  GLUCAP 150*    Microbiology: Results for orders placed or performed in visit on 06/06/15  Fecal occult blood, imunochemical     Status: None   Collection Time: 06/06/15  2:53 PM  Result Value Ref Range Status   Fecal Occult Bld Negative Negative Final    Coagulation Studies:  Recent Labs  08/02/15 1724  LABPROT 14.3  INR 1.10    Imaging: Ct Head Code Stroke W/o Cm  Result Date: 08/02/2015 CLINICAL DATA:  Right-sided facial numbness and drawing. EXAM: CT HEAD WITHOUT CONTRAST TECHNIQUE: Contiguous axial images were obtained from the base of the skull through the vertex without intravenous contrast. COMPARISON:  CT of the head 03/26/2014 FINDINGS: No mass effect or midline shift. No evidence of acute intracranial hemorrhage, or infarction. Sub cm focus of hypoattenuation in the subcortical left frontal lobe is seen with uncertain significance. No abnormal extra-axial fluid collections. Gray-white matter differentiation is otherwise normal. Basal cisterns are preserved. Atherosclerotic vascular calcifications are noted at the skullbase. No depressed skull fractures. Visualized paranasal sinuses and mastoid air cells are not opacified. IMPRESSION: No acute intracranial abnormality. Small area of hypoattenuation in the subcortical left frontal lobe which may represent dilated perivascular space versus a small subacute lacunar infarct. Electronically Signed   By: Ted Mcalpine M.D.   On: 08/02/2015 17:03   Assessment/Plan:  Peytan is a pleasant 75 year old patient who presents with numbness of the right face and right upper extremity. Her case was discussed with Kelsey Dunn. Given that her symptoms are  relatively mild it was  felt that it would be best to admit her overnight for observation. If she continues to do well overnight she should be ready for discharge in the morning.  Plan  1. Overnight observation for potential ischemic event.  2. If Saraiah is doing well in the morning she should be ready for discharge.      Vasilia Dise A. Hilda Blades, M.D. Neurohospitalist Phone: [phone]  08/02/2015, 6:02 PM

## 2015-08-02 NOTE — ED Notes (Signed)
NIH 2- right side facial droop and right hand numnbess. No changes in presentation. Passed swallow screen. Next neuro check 2000. Lanora Manis RN # [phone]

## 2015-08-03 ENCOUNTER — Observation Stay (HOSPITAL_BASED_OUTPATIENT_CLINIC_OR_DEPARTMENT_OTHER): Payer: Commercial Managed Care - HMO

## 2015-08-03 DIAGNOSIS — I6789 Other cerebrovascular disease: Secondary | ICD-10-CM | POA: Diagnosis not present

## 2015-08-03 DIAGNOSIS — G458 Other transient cerebral ischemic attacks and related syndromes: Secondary | ICD-10-CM | POA: Diagnosis not present

## 2015-08-03 DIAGNOSIS — I739 Peripheral vascular disease, unspecified: Secondary | ICD-10-CM

## 2015-08-03 DIAGNOSIS — R200 Anesthesia of skin: Secondary | ICD-10-CM | POA: Diagnosis not present

## 2015-08-03 DIAGNOSIS — I10 Essential (primary) hypertension: Secondary | ICD-10-CM | POA: Diagnosis not present

## 2015-08-03 DIAGNOSIS — I651 Occlusion and stenosis of basilar artery: Secondary | ICD-10-CM

## 2015-08-03 LAB — ECHOCARDIOGRAM COMPLETE
AO mean calculated velocity dopler: 166 cm/s
AOPV: 0.54 m/s
AOVTI: 55.9 cm
AV Area VTI index: 1.13 cm2/m2
AV Area VTI: 1.87 cm2
AV Area mean vel: 1.78 cm2
AV Mean grad: 12 mmHg
AV VEL mean LVOT/AV: 0.51
AV area mean vel ind: 0.98 cm2/m2
AVA: 2.05 cm2
AVLVOTPG: 6 mmHg
AVPG: 21 mmHg
AVPKVEL: 228 cm/s
CHL CUP AV PEAK INDEX: 1.03
CHL CUP AV VALUE AREA INDEX: 1.13
CHL CUP AV VEL: 2.05
CHL CUP DOP CALC LVOT VTI: 33.2 cm
E decel time: 243 msec
E/e' ratio: 13.43
FS: 33 % (ref 28–44)
HEIGHTINCHES: 59 in
IV/PV OW: 1.12
LA vol index: 25.8 mL/m2
LADIAMINDEX: 2.14 cm/m2
LASIZE: 39 mm
LAVOL: 46.9 mL
LAVOLA4C: 44.2 mL
LEFT ATRIUM END SYS DIAM: 39 mm
LV TDI E'MEDIAL: 6.64
LVEEAVG: 13.43
LVEEMED: 13.43
LVELAT: 7.4 cm/s
LVOT SV: 115 mL
LVOT area: 3.46 cm2
LVOT diameter: 21 mm
LVOT peak VTI: 0.59 cm
LVOT peak vel: 123 cm/s
Lateral S' vel: 11.2 cm/s
MV Dec: 243
MV pk E vel: 99.4 m/s
MVPG: 4 mmHg
MVPKAVEL: 105 m/s
PW: 7.91 mm — AB (ref 0.6–1.1)
RV TAPSE: 21.9 mm
TDI e' lateral: 7.4
Weight: 3097.6 oz

## 2015-08-03 LAB — LIPID PANEL
CHOL/HDL RATIO: 5.3 ratio
CHOLESTEROL: 169 mg/dL (ref 0–200)
HDL: 32 mg/dL — ABNORMAL LOW (ref >40)
LDL Cholesterol: 102 mg/dL — ABNORMAL HIGH (ref 0–99)
Triglycerides: 175 mg/dL — ABNORMAL HIGH (ref <150)
VLDL: 35 mg/dL (ref 0–40)

## 2015-08-03 LAB — BASIC METABOLIC PANEL
ANION GAP: 6 (ref 5–15)
BUN: 13 mg/dL (ref 6–20)
CALCIUM: 9.3 mg/dL (ref 8.9–10.3)
CO2: 27 mmol/L (ref 22–32)
CREATININE: 0.85 mg/dL (ref 0.44–1.00)
Chloride: 109 mmol/L (ref 101–111)
GFR calc Af Amer: >60 mL/min (ref >60)
GLUCOSE: 103 mg/dL — AB (ref 65–99)
Potassium: 4 mmol/L (ref 3.5–5.1)
Sodium: 142 mmol/L (ref 135–145)

## 2015-08-03 LAB — MAGNESIUM: MAGNESIUM: 2.1 mg/dL (ref 1.7–2.4)

## 2015-08-03 NOTE — Progress Notes (Signed)
  Echocardiogram 2D Echocardiogram has been performed.  Delcie Roch 08/03/2015, 11:44 AM

## 2015-08-03 NOTE — Progress Notes (Signed)
RN discussed discharge instructions with patient. Patient vocalized understanding of medications (no new changes, continue asa 81 mg and plavix), follow up appointment with pcp, states she will f/u with dr. Link Snuffer if his office recommends. Neuro assessment is unchanged, patient states she only has numbness/tingling in right hand and face, states it has not increased. Denies pain. Tele removed, iv removed. Will continue to monitor

## 2015-08-03 NOTE — Progress Notes (Signed)
Subjective:  Kelsey Dunn appears in much better spirits today. She continues to have some mild numbness the right side of the face and right upper extremity. It is mainly in the fourth and fifth digits. She reports no new neurological complaints at this time.  Exam: Vitals:   08/03/15 1000 08/03/15 1302  BP: (!) 178/71 (!) 165/67  Pulse: 81 73  Resp: 20 16  Temp:  97.7 F (36.5 C)    HEENT-  Normocephalic, no lesions, without obvious abnormality.  Normal external eye and conjunctiva.  Normal TM's bilaterally.  Normal auditory canals and external ears. Normal external nose, mucus membranes and septum.  Normal pharynx. Cardiovascular- regular rate and rhythm, S1, S2 normal, no murmur, click, rub or gallop, pulses palpable throughout   Lungs- chest clear, no wheezing, rales, normal symmetric air entry, Heart exam - S1, S2 normal, no murmur, no gallop, rate regular Abdomen- soft, non-tender; bowel sounds normal; no masses,  no organomegaly Extremities- less then 2 second capillary refill Lymph-no adenopathy palpable Musculoskeletal-no joint tenderness, deformity or swelling Skin-warm and dry, no hyperpigmentation, vitiligo, or suspicious lesions    Gen: In bed, NAD MS: Alert and oriented 3 CN: Cranial nerves II through XII are intact Motor: Motor exam is 5 out of 5 with no pronator drift Sensory: Sensation is intact DTR: 1-2+ throughout  Pertinent Labs/Diagnostics: Reviewed    Impression:   [name] appears to be doing well she feels a bit better than she did yesterday she continues to have some mild numbness on the right side of the face and right upper extremity especially in the fourth and fifth digits. An additional consideration would be an ulnar neuropathy which could present with numbness in the fourth and fifth digits as well.  Her case has been discussed with Dr. Etta Quill. He recommended overnight observation and since she is stable this morning she should be free for  discharge today.   Recommendations: 1) discharge to home as tolerated.   James A. Hilda Blades, M.D. Neurohospitalist Phone: [phone]   08/03/2015, 5:08 PM

## 2015-08-03 NOTE — Discharge Summary (Signed)
Physician Discharge Summary  Kelsey Dunn UVO:[id number] DOB: 10/01/1940 DOA: 08/02/2015  PCP: Crawford Givens, MD  Admit date: 08/02/2015 Discharge date: 08/03/2015  Time spent: 35 minutes  Recommendations for Outpatient Follow-up:  1. F/u with PCP in 1 week    Discharge Diagnoses:  Principal Problem:   TIA (transient ischemic attack) Active Problems:   Essential hypertension   CAD, NATIVE VESSEL   Occlusion and stenosis of basilar artery   PVD   Pacemaker-Medtronc   Numbness   Stroke [facility])   Discharge Condition: stable  Diet recommendation: cardaic  Filed Weights   08/02/15 1934  Weight: 87.8 kg (193 lb 9.6 oz)    History of present illness:  Kelsey Dunn a 75 y.o.female,With prior history of stroke in 2012, multiple TIAs, basilar artery stenosis (status post angioplasty), Mobitz type II heart block status post pacemaker in 2013, coronary artery disease with 70% stenosis of RCA with stent placement, occluded left common carotid artery with collaterals, hyperlipidemia, hypertension who was at [facility] ED with her brother when she suddenly developed right-sided facial numbness and numbness in her right hand. She thought it would pass away and went home from there. This was around 3:30 in the afternoon.However the symptoms persisted. She checked her blood pressure and was 170 systolic. She called EMS and when they checked her blood pressure was 180/90 mmHg. Patient denies any weakness in her extremities or fall. Patient denies any headache, blurred vision, dizziness, chest pain, palpitations, shortness of breath, abdominal pain, dysuria or diarrhea. Denies recent illness or change in the medications. Denies recent travel.  Vitals in the ED were stable except for mildly elevated blood pressure. Code stroke was called in the ED. Neuro hospitalist was consulted who recommended ED and given isolated sensory deficits she should be admitted on observation. Did not recommend  TPA.  Head CT showed no acute abnormality small area of hypoattenuation in the subcortical left frontal lobe which could represent a small subacute lacunar infarct. Patient cannot have MRI secondary to pacemaker. Evaluated by neurology who recommended to observe overnight and discharge home if stable.  Consultations:  neurology  Discharge Exam: Vitals:   08/03/15 1302 08/03/15 1743  BP: (!) 165/67 (!) 164/44  Pulse: 73 67  Resp: 16 16  Temp: 97.7 F (36.5 C) 97.8 F (36.6 C)    General: AxO X3 Cardiovascular: S1 S2 Respiratory: b/l clear Discharge Instructions    Current Discharge Medication List    CONTINUE these medications which have NOT CHANGED   Details  amLODipine (NORVASC) 5 MG tablet Take 1 tablet (5 mg total) by mouth daily. Qty: 90 tablet, Refills: 3    aspirin EC 81 MG tablet Take 81 mg by mouth daily.     clopidogrel (PLAVIX) 75 MG tablet Take 1 tablet (75 mg total) by mouth daily. Qty: 90 tablet, Refills: 3    furosemide (LASIX) 40 MG tablet Take 40 mg by mouth daily as needed for edema (infrequent urination).     lansoprazole (PREVACID) 15 MG capsule Take 15 mg by mouth daily as needed (reflux).     levothyroxine (SYNTHROID, LEVOTHROID) 100 MCG tablet Take 1 tablet (100 mcg total) by mouth daily. Qty: 90 tablet, Refills: 3    losartan-hydrochlorothiazide (HYZAAR) 100-25 MG tablet Take 1 tablet by mouth daily. Qty: 90 tablet, Refills: 3    nitroGLYCERIN (NITROSTAT) 0.4 MG SL tablet Place 0.4 mg under the tongue every 5 (five) minutes as needed for chest pain (MAX 3 TABLETS).     Phenazopyridine  HCl (AZO TABS PO) Take 1 tablet by mouth daily as needed (bladder spasms).     Red Yeast Rice 600 MG CAPS Take 600 mg by mouth 2 (two) times daily.    Cholecalciferol ([employer]S VITAMIN D3) 1000 units capsule Take 1 capsule (1,000 Units total) by mouth daily.       Allergies  Allergen Reactions  . Anesthetics, Amide Other (See Comments)    REACTION:FEVER,  SYNCOPE  . Codeine Nausea And Vomiting and Other (See Comments)    Hallucinations  . Nitrofurantoin Nausea Only and Other (See Comments)    Hallucinations  . Orphenadrine Citrate Nausea And Vomiting and Other (See Comments)     Hallucination  . Pravastatin Other (See Comments)    Myalgias  . Simvastatin Other (See Comments)    myalgias  . Pentosan Polysulfate Sodium Rash and Other (See Comments)     rash/ severe headache      The results of significant diagnostics from this hospitalization (including imaging, microbiology, ancillary and laboratory) are listed below for reference.    Significant Diagnostic Studies: Ct Head Code Stroke W/o Cm  Result Date: 08/02/2015 CLINICAL DATA:  Right-sided facial numbness and drawing. EXAM: CT HEAD WITHOUT CONTRAST TECHNIQUE: Contiguous axial images were obtained from the base of the skull through the vertex without intravenous contrast. COMPARISON:  CT of the head 03/26/2014 FINDINGS: No mass effect or midline shift. No evidence of acute intracranial hemorrhage, or infarction. Sub cm focus of hypoattenuation in the subcortical left frontal lobe is seen with uncertain significance. No abnormal extra-axial fluid collections. Gray-white matter differentiation is otherwise normal. Basal cisterns are preserved. Atherosclerotic vascular calcifications are noted at the skullbase. No depressed skull fractures. Visualized paranasal sinuses and mastoid air cells are not opacified. IMPRESSION: No acute intracranial abnormality. Small area of hypoattenuation in the subcortical left frontal lobe which may represent dilated perivascular space versus a small subacute lacunar infarct. Electronically Signed   By: Ted Mcalpine M.D.   On: 08/02/2015 17:03   Microbiology: No results found for this or any previous visit (from the past 240 hour(s)).   Labs: Basic Metabolic Panel:  Recent Labs Lab 08/02/15 1709 08/02/15 1724 08/03/15 0430  NA 141 135 142  K  3.0* 2.8* 4.0  CL 102 102 109  CO2  --  24 27  GLUCOSE 139* 141* 103*  BUN 18 16 13   CREATININE 0.90 0.96 0.85  CALCIUM  --  10.0 9.3  MG  --  2.1  --    Liver Function Tests:  Recent Labs Lab 08/02/15 1724  AST 22  ALT 18  ALKPHOS 59  BILITOT 0.6  PROT 6.7  ALBUMIN 4.1   No results for input(s): LIPASE, AMYLASE in the last 168 hours. No results for input(s): AMMONIA in the last 168 hours. CBC:  Recent Labs Lab 08/02/15 1709 08/02/15 1724  WBC  --  8.0  NEUTROABS  --  4.3  HGB 15.6* 15.4*  HCT 46.0 45.8  MCV  --  88.8  PLT  --  222   Cardiac Enzymes: No results for input(s): CKTOTAL, CKMB, CKMBINDEX, TROPONINI in the last 168 hours. BNP: BNP (last 3 results) No results for input(s): BNP in the last 8760 hours.  ProBNP (last 3 results) No results for input(s): PROBNP in the last 8760 hours.  CBG:  Recent Labs Lab 08/02/15 1718  GLUCAP 150*       Signed:  Alany Borman MD.  [facility] 08/03/2015, 6:56 PM

## 2015-08-03 NOTE — Progress Notes (Signed)
*  PRELIMINARY RESULTS* Vascular Ultrasound Carotid Duplex (Doppler) has been completed.  Preliminary findings: Right ICA 1-39% stenosis. Left chronically occluded CCA. Left ECA retrograde flow, feeding left ICA. Patent left ICA with dampened flow.  Antegrade vertebral flow bilaterally.  Farrel Demark, RDMS, RVT  08/03/2015, 11:05 AM

## 2015-08-03 NOTE — Progress Notes (Signed)
PROGRESS NOTE    Kelsey Dunn  ZOX:[id number] DOB: February 17, 1940 DOA: 08/02/2015 PCP: Crawford Givens, MD (  Brief Narrative: Kelsey Dunn  is a 75 y.o. female, With prior history of stroke in 2012, multiple TIAs, basilar artery stenosis (status post angioplasty), Mobitz type II heart block status post pacemaker in 2013, coronary artery disease with 70% stenosis of RCA with stent placement, occluded left common carotid artery with collaterals, hyperlipidemia, hypertension who was at [facility] ED with her brother when she suddenly developed right-sided facial numbness and numbness in her right hand. She thought it would pass away and went home from there. This was around 3:30 in the afternoon. However the symptoms persisted. She checked her blood pressure and was 170 systolic. She called EMS and when they checked her blood pressure was 180/90 mmHg. Patient denies any weakness in her extremities or fall. Patient denies any headache, blurred vision, dizziness, chest pain, palpitations, shortness of breath, abdominal pain, dysuria or diarrhea. Denies recent illness or change in the medications. Denies recent travel.  Vitals in the ED were stable except for mildly elevated blood pressure. Code stroke was called in the ED. Neuro hospitalist was consulted who recommended ED and given isolated sensory deficits she should be admitted on observation. Did not recommend TPA.  Head CT showed no acute abnormality small area of hypoattenuation in the subcortical left frontal lobe which could represent a small subacute lacunar infarct. Patient cannot have MRI secondary to pacemaker.   Assessment & Plan:   Principal Problem:   TIA (transient ischemic attack) Active Problems:   Essential hypertension   CAD, NATIVE VESSEL   Occlusion and stenosis of basilar artery   PVD   Pacemaker-Medtronc   Numbness   Stroke (HCC)  TIA (transient ischemic attack)Versus stroke involution Admit under observation on neuro  telemetry. Has recurrent TIA symptoms. Occluded left common carotid with collaterals.  Patient cannot get MRI due to pacemaker. Cont with  full dose aspirin and  Plavix. Allow permissive blood pressure. 2-D echo and carotid Dopplers.  (Patient allergic to statin)  PT/OT eval. SLP eval. Discussed with neurology- considering patient's history of basilar artery stenosis requiring angioplasty, will continue to follow-up overnight. If patient's symptoms improve patient could be discharged home tomorrow. Considering patient's current symptoms of numbness of the right hand will watch the patient overnight..  Active Problems: Hypokalemia Replenish with by mouth potassium. Check magnesium level    Essential hypertension Blood pressure mildly elevated. Allow permissive hypertension     Occlusion and stenosis of basilar artery Follows with Dr. Harrie Foreman For Mobitz type II block. Follows with Dr. Johney Frame.   Peripheral vascular disease On Plavix.   DVT prophylaxis: Lovenox  Code Status: Full code Family Communication:  Patient and husband  Disposition Plan: Home tomorrow   Consultants:  Neurology    Subjective: Complained of numbness on the palmar aspect of the right hand no numbness on the dorsal aspect. No numbness of the entire arm. Also experiences numbness right side of the mouth.  Objective: Vitals:   08/03/15 0614 08/03/15 0757 08/03/15 1000 08/03/15 1302  BP: (!) 140/44 (!) 150/52 (!) 178/71 (!) 165/67  Pulse: (!) 59 62 81 73  Resp:  16 20 16   Temp: 97.8 F (36.6 C) 98.2 F (36.8 C)  97.7 F (36.5 C)  TempSrc: Oral Oral Oral Oral  SpO2: 97% 100% 97% 99%  Weight:      Height:        Intake/Output Summary (  Last 24 hours) at 08/03/15 1518 Last data filed at 08/03/15 1239  Gross per 24 hour  Intake              240 ml  Output                0 ml  Net              240 ml   Filed Weights   08/02/15 1934  Weight: 87.8 kg (193 lb 9.6  oz)    Examination:  General exam: Appears calm and comfortable  Respiratory system: Clear to auscultation. Respiratory effort normal. Cardiovascular system: S1 & S2 heard, RRR. No JVD, murmurs, rubs, gallops or clicks. No pedal edema. Gastrointestinal system: Abdomen is nondistended, soft and nontender. No organomegaly or masses felt. Normal bowel sounds heard. Central nervous system: Alert and oriented. No focal neurological deficits. Extremities: Symmetric 5 x 5 power. Skin: No rashes, lesions or ulcers Psychiatry: Judgement and insight appear normal. Mood & affect appropriate.     Data Reviewed: I have personally reviewed following labs and imaging studies  CBC:  Recent Labs Lab 08/02/15 1709 08/02/15 1724  WBC  --  8.0  NEUTROABS  --  4.3  HGB 15.6* 15.4*  HCT 46.0 45.8  MCV  --  88.8  PLT  --  222   Basic Metabolic Panel:  Recent Labs Lab 08/02/15 1709 08/02/15 1724 08/03/15 0430  NA 141 135 142  K 3.0* 2.8* 4.0  CL 102 102 109  CO2  --  24 27  GLUCOSE 139* 141* 103*  BUN 18 16 13   CREATININE 0.90 0.96 0.85  CALCIUM  --  10.0 9.3  MG  --  2.1  --    GFR: Estimated Creatinine Clearance: 57.6 mL/min (by C-G formula based on SCr of 0.85 mg/dL). Liver Function Tests:  Recent Labs Lab 08/02/15 1724  AST 22  ALT 18  ALKPHOS 59  BILITOT 0.6  PROT 6.7  ALBUMIN 4.1   No results for input(s): LIPASE, AMYLASE in the last 168 hours. No results for input(s): AMMONIA in the last 168 hours. Coagulation Profile:  Recent Labs Lab 08/02/15 1724  INR 1.10   Cardiac Enzymes: No results for input(s): CKTOTAL, CKMB, CKMBINDEX, TROPONINI in the last 168 hours. BNP (last 3 results) No results for input(s): PROBNP in the last 8760 hours. HbA1C: No results for input(s): HGBA1C in the last 72 hours. CBG:  Recent Labs Lab 08/02/15 1718  GLUCAP 150*   Lipid Profile:  Recent Labs  08/03/15 0439  CHOL 169  HDL 32*  LDLCALC 102*  TRIG 175*  CHOLHDL  5.3   Thyroid Function Tests: No results for input(s): TSH, T4TOTAL, FREET4, T3FREE, THYROIDAB in the last 72 hours. Anemia Panel: No results for input(s): VITAMINB12, FOLATE, FERRITIN, TIBC, IRON, RETICCTPCT in the last 72 hours. Sepsis Labs: No results for input(s): PROCALCITON, LATICACIDVEN in the last 168 hours.  No results found for this or any previous visit (from the past 240 hour(s)).       Radiology Studies: Ct Head Code Stroke W/o Cm  Result Date: 08/02/2015 CLINICAL DATA:  Right-sided facial numbness and drawing. EXAM: CT HEAD WITHOUT CONTRAST TECHNIQUE: Contiguous axial images were obtained from the base of the skull through the vertex without intravenous contrast. COMPARISON:  CT of the head 03/26/2014 FINDINGS: No mass effect or midline shift. No evidence of acute intracranial hemorrhage, or infarction. Sub cm focus of hypoattenuation in the subcortical left frontal lobe  is seen with uncertain significance. No abnormal extra-axial fluid collections. Gray-white matter differentiation is otherwise normal. Basal cisterns are preserved. Atherosclerotic vascular calcifications are noted at the skullbase. No depressed skull fractures. Visualized paranasal sinuses and mastoid air cells are not opacified. IMPRESSION: No acute intracranial abnormality. Small area of hypoattenuation in the subcortical left frontal lobe which may represent dilated perivascular space versus a small subacute lacunar infarct. Electronically Signed   By: Ted Mcalpine M.D.   On: 08/02/2015 17:03       Scheduled Meds: . aspirin  300 mg Rectal Daily   Or  . aspirin  325 mg Oral Daily  . cholecalciferol  1,000 Units Oral Daily  . clopidogrel  75 mg Oral Daily  . enoxaparin (LOVENOX) injection  40 mg Subcutaneous Q24H  . levothyroxine  100 mcg Oral QAC breakfast  . pantoprazole  20 mg Oral Daily   Continuous Infusions: . sodium chloride 50 mL/hr at 08/02/15 1958     LOS: 0 days    Time  spent: 40 min    Haidar Muse, MD [facility] Pager [phone]  If 7PM-7AM, please contact night-coverage [email or web address] Password [facility] 08/03/2015, 3:18 PM

## 2015-08-03 NOTE — Progress Notes (Signed)
PT Cancellation Note  Patient Details Name: Kelsey Dunn MRN: [id number] DOB: 1940/01/08   Cancelled Treatment:    Reason Eval/Treat Not Completed: Patient not medically ready   Fabio Asa 08/03/2015, 10:26 AM Charlotte Crumb, PT DPT  [phone]

## 2015-08-03 NOTE — Progress Notes (Signed)
Patient stated the numbness in right hand and face has increased. She states when she initially came to the hospital, the numbness was only in right fourth and fifth digit, states the numbness and tingling is in whole hand. Also says that the numbness in face was just on side of lip, now states the numbness is increasing towards nose and cheek. She does not know what time exactly this started, but states it was "sometime during the night." Patient laid flat in bed. Neurology PA notified.

## 2015-08-03 NOTE — Progress Notes (Signed)
OT Cancellation Note  Patient Details Name: Kelsey Dunn MRN: [id number] DOB: 02-12-1940   Cancelled Treatment:    Reason Eval/Treat Not Completed: Patient not medically ready (active bedrest orders)  Gaye Alken M.S., OTR/L Pager: [phone]  08/03/2015, 9:50 AM

## 2015-08-03 NOTE — Evaluation (Addendum)
Occupational Therapy Evaluation and Discharge Patient Details Name: Kelsey Dunn MRN: [id number] DOB: September 08, 1940 Today's Date: 08/03/2015    History of Present Illness 75 y.o. female, With prior history of stroke in 2012, multiple TIAs, basilar artery stenosis (status post angioplasty), Mobitz type II heart block status post pacemaker in 2013, coronary artery disease with 70% stenosis of RCA with stent placement, occluded left common carotid artery with collaterals, hyperlipidemia, hypertension who was at [facility] ED with her brother when she suddenly developed right-sided facial numbness and numbness in her right hand. CT on 7/28 neg for acute abnormalities.    Clinical Impression   Pt reports she was independent with ADL PTA. Currently pt overall supervision for safety with ADL and functional mobility. Pt presenting with numbness in palmar side of R hand and slight decrease in fine motor coordination. Educated pt on safety precautions with sensation changes and provided pt with handout for fine motor coordination activities. Also educated pt on signs/symptoms of stroke and importance of presenting to ED when symptoms occur. Pt planning to d/c home with 24/7 supervision from family. No further acute OT needs identified; signing off at this time. Please re-consult if needs change. Thank you for this referral.    Follow Up Recommendations  No OT follow up;Supervision - Intermittent    Equipment Recommendations  None recommended by OT    Recommendations for Other Services       Precautions / Restrictions Precautions Precautions: None Restrictions Weight Bearing Restrictions: No      Mobility Bed Mobility Overal bed mobility: Modified Independent                Transfers Overall transfer level: Needs assistance Equipment used: None Transfers: Sit to/from Stand Sit to Stand: Supervision         General transfer comment: Supervision for safety. No LOB or unsteadiness  noted with sit to stand.    Balance Overall balance assessment: No apparent balance deficits (not formally assessed)                                          ADL Overall ADL's : Needs assistance/impaired Eating/Feeding: Independent;Sitting   Grooming: Supervision/safety;Standing;Wash/dry hands;Oral care   Upper Body Bathing: Supervision/ safety;Sitting   Lower Body Bathing: Supervison/ safety;Sit to/from stand   Upper Body Dressing : Set up;Sitting   Lower Body Dressing: Supervision/safety;Sit to/from stand   Toilet Transfer: Supervision/safety;Ambulation;Regular Toilet   Toileting- Architect and Hygiene: Supervision/safety;Sit to/from stand   Tub/ Shower Transfer: Supervision/safety;Walk-in shower;Ambulation   Functional mobility during ADLs: Supervision/safety General ADL Comments: Educated pt on R hand sensation changes and to be careful with hot/cold and pain upon return home. Provided pt with fine motor coordination handout; pt verbalized understanding of exercises. Encouraged functional use of R hand. Educated pt on signs/symptoms of stroke and importance of presenting to ED if experiencing symptoms; husband and pt verbalize understanding.     Vision Vision Assessment?: No apparent visual deficits   Perception     Praxis      Pertinent Vitals/Pain Pain Assessment: No/denies pain     Hand Dominance Right   Extremity/Trunk Assessment Upper Extremity Assessment Upper Extremity Assessment: RUE deficits/detail RUE Deficits / Details: Slight decrease in fine motor coordination. Pt report numbness on palmar side of R hand. RUE Sensation: decreased light touch RUE Coordination: decreased fine motor   Lower Extremity Assessment Lower Extremity  Assessment: Defer to PT evaluation   Cervical / Trunk Assessment Cervical / Trunk Assessment: Normal   Communication Communication Communication: No difficulties   Cognition  Arousal/Alertness: Awake/alert Behavior During Therapy: WFL for tasks assessed/performed Overall Cognitive Status: Within Functional Limits for tasks assessed                     General Comments       Exercises       Shoulder Instructions      Home Living Family/patient expects to be discharged to:: Private residence Living Arrangements: Spouse/significant other Available Help at Discharge: Family;Available 24 hours/day Type of Home: House Home Access: Stairs to enter [employer] of Steps: 3 Entrance Stairs-Rails: Right;Left;Can reach both Home Layout: One level     Bathroom Shower/Tub: Producer, television/film/video: Standard     Home Equipment: None          Prior Functioning/Environment Level of Independence: Independent        Comments: cooks, cleans, drives.    OT Diagnosis: Other (comment) (sensation changes)   OT Problem List:     OT Treatment/Interventions:      OT Goals(Current goals can be found in the care plan section) Acute Rehab OT Goals Patient Stated Goal: get back to living OT Goal Formulation: All assessment and education complete, DC therapy  OT Frequency:     Barriers to D/C:            Co-evaluation              End of Session Nurse Communication: Mobility status  Activity Tolerance: Patient tolerated treatment well Patient left: in bed;with call bell/phone within reach;with bed alarm set;with family/visitor present   Time: 1334-1400 OT Time Calculation (min): 26 min Charges:  OT General Charges $OT Visit: 1 Procedure OT Evaluation $OT Eval Low Complexity: 1 Procedure OT Treatments $Self Care/Home Management : 8-22 mins G-Codes: OT G-codes **NOT FOR INPATIENT CLASS** Functional Assessment Tool Used: Clinical judgement Functional Limitation: Self care Self Care Current Status (Z6109): At least 1 percent but less than 20 percent impaired, limited or restricted Self Care Goal Status (U0454): At  least 1 percent but less than 20 percent impaired, limited or restricted Self Care Discharge Status [phone]): At least 1 percent but less than 20 percent impaired, limited or restricted   Gaye Alken M.S., OTR/L Pager: [phone]  08/03/2015, 2:13 PM

## 2015-08-03 NOTE — Evaluation (Signed)
SLP Cancellation Note  Patient Details Name: Kelsey Dunn MRN: [id number] DOB: 07/27/1940   Cancelled treatment:       Reason Eval/Treat Not Completed:  (pt passed an RNSSS and RN denies pt having dysphagia symptoms,  MD please order Speech and cog evaluation if indicated)   Mills Koller, MS [facility] SLP [phone]

## 2015-08-05 ENCOUNTER — Telehealth: Payer: Self-pay

## 2015-08-05 LAB — HEMOGLOBIN A1C
HEMOGLOBIN A1C: 5.3 % (ref 4.8–5.6)
MEAN PLASMA GLUCOSE: 105 mg/dL

## 2015-08-05 NOTE — Telephone Encounter (Signed)
1st attempt for TCM outreach call. Left voicemail with contact info.

## 2015-08-05 NOTE — Telephone Encounter (Signed)
Transition Care Management Follow-up Telephone Call     Date discharged? 08/03/2015  How have you been since you were released from the hospital? Pt states she is very tired.    Any patient concerns? None    Do you understand why you were in the hospital? Yes   Do you understand the discharge instructions? Yes   Where were you discharged to? Home   Items Reviewed:  Medications reviewed: Yes   Allergies reviewed: Yes  Dietary changes reviewed: N/A  Referrals reviewed: N/A   Functional Questionnaire:    Activities of Daily Living (ADLs):    Pt is independent with the following ADLs: Personal hygiene  Dressing Eating  Maintaining continence Transferring  Independent Activities of Daily Living (ADLs):  Pt is independent with the following iADLs:  Basic communication skills Transportation Meal preparation  Shopping Housework  Managing medications Managing personal finances   Confirmed importance and date/time of follow-up visits scheduled YES  Provider Appointment booked with PCP 08/09/15 @ 1215  Confirmed with patient if condition begins to worsen call PCP or go to the ER.  Patient was given the office number and encouraged to call back with question or concerns: YES

## 2015-08-08 LAB — VAS US CAROTID
LEFT ECA DIAS: 7 cm/s
LEFT VERTEBRAL DIAS: 13 cm/s
LICAPSYS: -28 cm/s
Left ICA dist dias: -7 cm/s
Left ICA dist sys: -51 cm/s
Left ICA prox dias: -4 cm/s
RCCADSYS: -121 cm/s
RCCAPDIAS: 14 cm/s
RIGHT ECA DIAS: -15 cm/s
RIGHT VERTEBRAL DIAS: 14 cm/s
Right CCA prox sys: 93 cm/s

## 2015-08-09 ENCOUNTER — Encounter: Payer: Self-pay | Admitting: Family Medicine

## 2015-08-09 ENCOUNTER — Ambulatory Visit (INDEPENDENT_AMBULATORY_CARE_PROVIDER_SITE_OTHER): Payer: Commercial Managed Care - HMO | Admitting: Family Medicine

## 2015-08-09 DIAGNOSIS — I639 Cerebral infarction, unspecified: Secondary | ICD-10-CM | POA: Diagnosis not present

## 2015-08-09 DIAGNOSIS — Z5189 Encounter for other specified aftercare: Secondary | ICD-10-CM

## 2015-08-09 NOTE — Progress Notes (Signed)
Admit date: 08/02/2015 Discharge date: 08/03/2015  Time spent: 35 minutes  Recommendations for Outpatient Follow-up:  1. F/u with PCP in 1 week    Discharge Diagnoses:  Principal Problem:   TIA (transient ischemic attack) Active Problems:   Essential hypertension   CAD, NATIVE VESSEL   Occlusion and stenosis of basilar artery   PVD   Pacemaker-Medtron[employer]   Numbness   Stroke [facility])   Discharge Condition: stable  Diet recommendation: cardaic     Filed Weights   08/02/15 1934  Weight: 87.8 kg (193 lb 9.6 oz)    History of present illness:  ConnieWoodyis a 75 y.o.female,With prior history of stroke in 2012, multiple TIAs, basilar artery stenosis (status post angioplasty), Mobitz type II heart block status post pacemaker in 2013, coronary artery disease with 70% stenosis of RCA with stent placement, occluded left common carotid artery with collaterals, hyperlipidemia, hypertension who was at [facility] ED with her brother when she suddenly developed right-sided facial numbness and numbness in her right hand. She thought it would pass away and went home from there. This was around 3:30 in the afternoon.However the symptoms persisted. She checked her blood pressure and was 170 systolic. She called EMS and when they checked her blood pressure was 180/90 mmHg. Patient denies any weakness in her extremities or fall. Patient denies any headache, blurred vision, dizziness, chest pain, palpitations, shortness of breath, abdominal pain, dysuria or diarrhea. Denies recent illness or change in the medications. Denies recent travel.  Vitals in the ED were stable except for mildly elevated blood pressure. Code stroke was called in the ED. Neuro hospitalist was consulted who recommended ED and given isolated sensory deficits she should be admitted on observation. Did not recommend TPA.  Head CT showed no acute abnormality small area of hypoattenuation in the subcortical left frontal  lobe which could represent a small subacute lacunar infarct.Patient cannot have MRI secondary to pacemaker. Evaluated by neurology who recommended to observe overnight and discharge home if stable.  Consultations:  neurology  Discharge Exam:     Vitals:   08/03/15 1302 08/03/15 1743  BP: (!) 165/67 (!) 164/44  Pulse: 73 67  Resp: 16 16  Temp: 97.7 F (36.5 C) 97.8 F (36.6 C)    General: AxO X3 Cardiovascular: S1 S2 Respiratory: b/l clear Discharge Instructions       Current Discharge Medication List       CONTINUE these medications which have NOT CHANGED   Details  amLODipine (NORVASC) 5 MG tablet Take 1 tablet (5 mg total) by mouth daily. Qty: 90 tablet, Refills: 3    aspirin EC 81 MG tablet Take 81 mg by mouth daily.     clopidogrel (PLAVIX) 75 MG tablet Take 1 tablet (75 mg total) by mouth daily. Qty: 90 tablet, Refills: 3    furosemide (LASIX) 40 MG tablet Take 40 mg by mouth daily as needed for edema (infrequent urination).     lansoprazole (PREVACID) 15 MG capsule Take 15 mg by mouth daily as needed (reflux).     levothyroxine (SYNTHROID, LEVOTHROID) 100 MCG tablet Take 1 tablet (100 mcg total) by mouth daily. Qty: 90 tablet, Refills: 3    losartan-hydrochlorothiazide (HYZAAR) 100-25 MG tablet Take 1 tablet by mouth daily. Qty: 90 tablet, Refills: 3    nitroGLYCERIN (NITROSTAT) 0.4 MG SL tablet Place 0.4 mg under the tongue every 5 (five) minutes as needed for chest pain (MAX 3 TABLETS).     Phenazopyridine HCl (AZO TABS PO) Take 1  tablet by mouth daily as needed (bladder spasms).     Red Yeast Rice 600 MG CAPS Take 600 mg by mouth 2 (two) times daily.    Cholecalciferol ([employer] VITAMIN D3) 1000 units capsule Take 1 capsule     Above d/w pt.  Admitted with possible TIA vs CVA.  Statin intolerant.  No change in meds.  Still with residual sx- R face sensation is some better but R hand is still altered.  No motor deficit in the hand.  She  couldn't have MRI, d/w pt about prev CT.    PMH and SH reviewed  ROS: Per HPI unless specifically indicated in ROS section   Meds, vitals, and allergies reviewed.   GEN: nad, alert and oriented HEENT: mucous membranes moist NECK: supple w/o LA CV: rrr. PULM: ctab, no inc wob ABD: soft, +bs EXT: no edema CN 2-12 wnl B, S/S/DTR wnl x4 except for R lip droop and R face/R arm change in sensation

## 2015-08-09 NOTE — Patient Instructions (Signed)
Don't change your meds for now and I'll check with cardiology.  Take care.  Glad to see you.  Update me as needed.

## 2015-08-09 NOTE — Progress Notes (Signed)
Pre visit review using our clinic review tool, if applicable. No additional management support is needed unless otherwise documented below in the visit note. 

## 2015-08-11 ENCOUNTER — Encounter: Payer: Self-pay | Admitting: Family Medicine

## 2015-08-11 NOTE — Assessment & Plan Note (Signed)
Likely DX, with mult risk factors d/w pt.  No change in meds for now and I'll check with cardiology about possible change to aggrenox in the future with possible lose dose of statin vs praluent.  She agrees.   At this point, still okay for outpatient f/u.  All questions answered at OV to the best of my ability, with the issues above pending.  She agrees.

## 2015-08-12 ENCOUNTER — Telehealth: Payer: Self-pay | Admitting: Family Medicine

## 2015-08-12 MED ORDER — ASPIRIN-DIPYRIDAMOLE ER 25-200 MG PO CP12: 1.0000 | ORAL_CAPSULE | Freq: Two times a day (BID) | ORAL | 99 refills | Status: DC

## 2015-08-12 MED ORDER — SIMVASTATIN 10 MG PO TABS: 10.0000 mg | ORAL_TABLET | ORAL | 99 refills | Status: DC

## 2015-08-12 NOTE — Telephone Encounter (Signed)
Call pt.  D/w cards- stop aspirin, change to aggrenox.  rx sent.  Let me know if this is prohibitively expensive.   Try statin once a week; if tolerated, then inc to twice a week.  rx sent.  Thanks.

## 2015-08-13 NOTE — Telephone Encounter (Signed)
Patient advised.  Patient says that the pharmacy has already called and said a PA would be required but that even if the PA is approved, the patient's part will still be expensive and cause her to hit the donut hole quickly.  For now, patient will continue with the aspirin and asks if perhaps the aspirin dosage could be increased.  Patient is agreeable to take statin once a week and increasing to twice a week as tolerated.

## 2015-08-13 NOTE — Telephone Encounter (Signed)
Patient advised.

## 2015-08-13 NOTE — Telephone Encounter (Signed)
The higher dose of aspirin likely wouldn't be beneficial. Continue previous dose of aspirin for now.   I'll check with Kelsey Dunn to see if she can get some help on the aggrenox.   Would go ahead as planned with the statin for now.   Thanks.

## 2015-10-10 ENCOUNTER — Ambulatory Visit (INDEPENDENT_AMBULATORY_CARE_PROVIDER_SITE_OTHER): Payer: Commercial Managed Care - HMO

## 2015-10-10 DIAGNOSIS — Z23 Encounter for immunization: Secondary | ICD-10-CM | POA: Diagnosis not present

## 2015-10-21 NOTE — Progress Notes (Signed)
Cardiology Office Note Date:  10/22/2015  Patient ID:  Kelsey Dunn, DOB 10/31/1940, MRN [id number] PCP:  Crawford GivensGraham [name], MD  Cardiologist:  Dr. Sanjuana KavaMcAlhaney Electrophysiologist: Dr. Johney FrameAllred   Chief Complaint: routine device visit  History of Present Illness: Kelsey HopeConnie R Dunn is a 75 y.o. female with history of CAD, hypothyroidism, HTN, HLD, CVA, known vascular disease basilar artery angioplasty in 2010, and known occluded L ICA, follows w/neurology, , mobitz II heart block w/PPM, comes in today to be seen for Dr. Johney FrameAllred.  He was last seen by him in Oct 2016, at that time with significant concerns about her daughter in-law's health, though doing well in regards to her pacer and in general with a chronic component of SOB.  She reports today feeling quite well.  She denies any kind of CP or SOB, no near syncope or syncope.  She mentions the feeling of a missing heart beat now and then.  She is the primary caregiver for her husband who has numerous health issues and helps with cooking/cleanaing given her daughter in-law's health continues to decline, though currently in remission, remains essentially bed-ridden with her MS.  She gets her relaxation in reading books.  Device information: MDT dual chamber PPM, implanted 11/11/11, dr. Johney FrameAllred, Mobitz II heart block.  She is PACER DEPENDENT TODAY TO 40BPM  Past Medical History:  Diagnosis Date  . Arthritis    "fingers" (02/27/2013)  . Basilar artery stenosis    90%  . Bladder spasms   . Blood transfusion 1960's   w/childbirth  . CAD (coronary artery disease)    a. 06/2009 cath: severe stenosis RCA with placement drug eluting stent;  b. 08/2011 cath: normal left main, luminal LAD irregularities, luminal LCx irregularities, luminal RCA irregularities and no ISR to mid-RCA DES, LVEF 60-65%; c. 02/2013 nonischemic CL, EF 79%.  . CVA (cerebral infarction) December 2012  . Family history of anesthesia complication    "first cousin had very fever and  almost died" (02/27/2013)  . Fibromyalgia   . HLD (hyperlipidemia) 01/1997  . Hypertension   . Hypothyroidism 1990  . IC (interstitial cystitis)   . Labile hypertension 08/1991  . Left-sided carotid artery obstruction   . Malignant hyperthermia    "told to warn everybody before I had surgery that my 1st cousin had malignant hyperthermia; I've had symptoms of it too" (02/27/2013)  . Obesity   . Pacemaker 11/11/2011   dual chamber pacemaker  . Pneumonia    "twice" (02/27/2013)  . Second degree Mobitz II AV block   . Stroke [facility](HCC)    "they tell me I've had some mini strokes; nothing that's bothered me though" (02/27/2013)  . Stroke (HCC) 2017  . TIA (transient ischemic attack)    multiple/notes 08/02/2015  . TIA (transient ischemic attack)     Past Surgical History:  Procedure Laterality Date  . basilar artery angioplasty  09/04/08   sm right brachial hematoma  . Bilateral Vert & Subclavian Angiogram  07/19/08   85-90% stenosis mid basilar art; occluded left vert artery; occluded left comm carotid artery w/ collat flow  . BLADDER SUSPENSION  1974  . CARDIAC CATHETERIZATION  1993   normal  . carotid arteriogram     bilat  . Carotid US  11/93   Occl L comm carotid  . Carotid US  4/98   L comm occl R-ok  . Carotid US  01/30/03   L occl  R less 40%  . CATARACT EXTRACTION W/ INTRAOCULAR LENS  IMPLANT Bilateral 2007-2008  . Cath single vess dz  06/13/09   70% stenosis RCA EF 65-70%  . CHOLECYSTECTOMY  1985  . CORONARY ANGIOPLASTY WITH STENT PLACEMENT  06/19/09  . DEXA  10/15/04   Troch -0.4 o/w pos  . EMG  1/01   LE, RUE normal  . EYE SURGERY    . INSERT / REPLACE / REMOVE PACEMAKER  11/11/2011   MDT Adapta L implanted by Dr Johney Frame. [employer]  . LEFT HEART CATHETERIZATION WITH CORONARY ANGIOGRAM N/A 08/10/2011   Procedure: LEFT HEART CATHETERIZATION WITH CORONARY ANGIOGRAM;  Surgeon: Kathleene Hazel, MD;  Location: [facility] CATH LAB;  Service: Cardiovascular;  Laterality: N/A;  . LEFT  HEART CATHETERIZATION WITH CORONARY ANGIOGRAM N/A 07/28/2013   Procedure: LEFT HEART CATHETERIZATION WITH CORONARY ANGIOGRAM;  Surgeon: Kathleene Hazel, MD;  Location: [facility] CATH LAB;  Service: Cardiovascular;  Laterality: N/A;  . PERMANENT PACEMAKER INSERTION N/A 11/11/2011   Procedure: PERMANENT PACEMAKER INSERTION;  Surgeon: Hillis Range, MD;  Location: [facility] CATH LAB;  Service: Cardiovascular;  Laterality: N/A;  . RENAL ANGIOPLASTY  1993   normal  . US RENAL/AORTA  12/93   Normal  . VAGINAL HYSTERECTOMY  1974   dysmennorhea  . Visual evoked response  11/93; 1/01   normal    Current Outpatient Prescriptions  Medication Sig Dispense Refill  . amLODipine (NORVASC) 5 MG tablet Take 1 tablet (5 mg total) by mouth daily. 90 tablet 3  . clopidogrel (PLAVIX) 75 MG tablet Take 1 tablet (75 mg total) by mouth daily. 90 tablet 3  . dipyridamole-aspirin (AGGRENOX) 200-25 MG 12hr capsule Take 1 capsule by mouth 2 (two) times daily. 60 capsule prn  . furosemide (LASIX) 40 MG tablet Take 40 mg by mouth daily as needed for edema (infrequent urination).     . lansoprazole (PREVACID) 15 MG capsule Take 15 mg by mouth daily as needed (reflux).     Marland Kitchen levothyroxine (SYNTHROID, LEVOTHROID) 100 MCG tablet Take 1 tablet (100 mcg total) by mouth daily. (Patient taking differently: Take 100 mcg by mouth daily before breakfast. ) 90 tablet 3  . losartan-hydrochlorothiazide (HYZAAR) 100-25 MG tablet Take 1 tablet by mouth daily. 90 tablet 3  . nitroGLYCERIN (NITROSTAT) 0.4 MG SL tablet Place 0.4 mg under the tongue every 5 (five) minutes as needed for chest pain (MAX 3 TABLETS).     . Phenazopyridine HCl (AZO TABS PO) Take 1 tablet by mouth daily as needed (bladder spasms).     . Red Yeast Rice 600 MG CAPS Take 600 mg by mouth 2 (two) times daily.    . simvastatin (ZOCOR) 10 MG tablet Take 1 tablet (10 mg total) by mouth once a week. Increase to twice weekly if tolerated. 30 tablet prn   No current  facility-administered medications for this visit.     Allergies:   Anesthetics, amide; Codeine; Nitrofurantoin; Orphenadrine citrate; Pravastatin; Simvastatin; and Pentosan polysulfate sodium   Social History:  The patient  reports that she has never smoked. She has never used smokeless tobacco. She reports that she does not drink alcohol or use drugs.   Family History:  The patient's family history includes Breast cancer in her maternal aunt; Diabetes in her brother; Heart attack in her mother; Lung cancer in her father; Lymphoma in her mother; Stroke in her mother.  ROS:  Please see the history of present illness.    All other systems are reviewed and otherwise negative.   PHYSICAL EXAM:  VS:  BP Marland Kitchen)  172/80   Pulse 74   Ht 4\' 11"  (1.499 m)   Wt 190 lb (86.2 kg)   BMI 38.38 kg/m  BMI: Body mass index is 38.38 kg/m. Well nourished, well developed, in no acute distress  HEENT: normocephalic, atraumatic  Neck: no JVD, carotid bruits or masses Cardiac:  RRR; no significant murmurs, no rubs, or gallops Lungs:  clear to auscultation bilaterally, no wheezing, rhonchi or rales  Abd: soft, nontender, central obesity MS: no deformity, age appropriate atrophy Ext: no edema  Skin: warm and dry, no rash Neuro:  No gross deficits appreciated Psych: euthymic mood, full affect  PPM site is stable, no tethering or discomfort   EKG:  Done 08/06/15 SR, V paced PPM interrogation today and reviewed by myself: battery and lead status stable, no AF.  She has high degree heart block and pacing at 40 today, given the sensation of missing beats, MVP is turned off today  08/03/15: TTE Study Conclusions - Left ventricle: The cavity size was normal. Wall thickness was   normal. Systolic function was normal. The estimated ejection   fraction was in the range of 60% to 65%. Wall motion was normal;   there were no regional wall motion abnormalities. Features are   consistent with a pseudonormal left  ventricular filling pattern,   with concomitant abnormal relaxation and increased filling   pressure (grade 2 diastolic dysfunction). - Ventricular septum: Septal motion showed paradox. These changes   are consistent with right ventricular pacing. - Aortic valve: There was mild stenosis. Valve area (VTI): 2.05   cm^2. Valve area (Vmax): 1.87 cm^2. Valve area (Vmean): 1.78   cm^2. - Mitral valve: There was mild to moderate regurgitation directed   centrally. - Left atrium: The atrium was mildly dilated. 39mm  Recent Labs: 05/28/2015: TSH 1.05 08/02/2015: ALT 18; Hemoglobin 15.4; Magnesium 2.1; Platelets 222 08/03/2015: BUN 13; Creatinine, Ser 0.85; Potassium 4.0; Sodium 142  08/03/2015: Cholesterol 169; HDL 32; LDL Cholesterol 102; Total CHOL/HDL Ratio 5.3; Triglycerides 175; VLDL 35   CrCl cannot be calculated (Patient's most recent lab result is older than the maximum 21 days allowed.).   Wt Readings from Last 3 Encounters:  10/22/15 190 lb (86.2 kg)  08/09/15 196 lb (88.9 kg)  08/02/15 193 lb 9.6 oz (87.8 kg)     Other studies reviewed: Additional studies/records reviewed today include: summarized above  ASSESSMENT AND PLAN:  1. Mobitz II, PPM     normal device function, MVP programmed off today with pt c/o missing beats  2. VHD     Mild AS, mild-mod MR   3. CAD     no symptoms     C/w Dr. Sanjuana Kava  4. CVA, recurrent TIA's     Known vascular (Corotid and basilar artery)     Follows with neurology  5. HTN    High today, the patient reports she follows this with Dr. Sanjuana Kava and her PMD, apparently somewhat labile     Given her vascular disease and recurrent TIA's will make no changes and defer to her primary team   Disposition: F/u with 3 month remote, Dr. Johney Frame in 1 year sooner if needed.  Current medicines are reviewed at length with the patient today.  The patient did not have any concerns regarding medicines.  Judith Blonder, PA-C 10/22/2015 11:11 AM       [facility] [address] [place] [address] [phone] (office)  [phone] (fax)

## 2015-10-22 ENCOUNTER — Encounter (INDEPENDENT_AMBULATORY_CARE_PROVIDER_SITE_OTHER): Payer: Self-pay

## 2015-10-22 ENCOUNTER — Ambulatory Visit (INDEPENDENT_AMBULATORY_CARE_PROVIDER_SITE_OTHER): Payer: Commercial Managed Care - HMO | Admitting: Physician Assistant

## 2015-10-22 ENCOUNTER — Encounter: Payer: Self-pay | Admitting: Physician Assistant

## 2015-10-22 VITALS — BP 172/80 | HR 74 | Ht 59.0 in | Wt 190.0 lb

## 2015-10-22 DIAGNOSIS — I441 Atrioventricular block, second degree: Secondary | ICD-10-CM | POA: Diagnosis not present

## 2015-10-22 DIAGNOSIS — I2510 Atherosclerotic heart disease of native coronary artery without angina pectoris: Secondary | ICD-10-CM

## 2015-10-22 DIAGNOSIS — I10 Essential (primary) hypertension: Secondary | ICD-10-CM | POA: Diagnosis not present

## 2015-10-22 NOTE — Patient Instructions (Addendum)
Medication Instructions:   Your physician recommends that you continue on your current medications as directed. Please refer to the Current Medication list given to you today.     If you need a refill on your cardiac medications before your next appointment, please call your pharmacy.  Labwork: NONE ORDER TODAY    Testing/Procedures: NONE ORDER TODAY    Follow-Up: Your physician wants you to follow-up in: ONE YEAR WITH  ALLRED You will receive a reminder letter in the mail two months in advance. If you don't receive a letter, please call our office to schedule the follow-up appointment.      Remote monitoring is used to monitor your Pacemaker of ICD from home. This monitoring reduces the number of office visits required to check your device to one time per year. It allows us to keep an eye on the functioning of your device to ensure it is working properly. You are scheduled for a device check from home on . 01/22/16..You may send your transmission at any time that day. If you have a wireless device, the transmission will be sent automatically. After your physician reviews your transmission, you will receive a postcard with your next transmission date.     Any Other Special Instructions Will Be Listed Below (If Applicable).                                                                                                                                                   

## 2015-10-23 ENCOUNTER — Telehealth: Payer: Self-pay | Admitting: Cardiovascular Disease

## 2015-10-23 MED ORDER — ASPIRIN EC 81 MG PO TBEC: 81.0000 mg | DELAYED_RELEASE_TABLET | Freq: Every day | ORAL | 3 refills | Status: DC

## 2015-10-23 NOTE — Telephone Encounter (Signed)
New Message  Pt c/o medication issue:  1. Name of Medication: Dipyridamole-aspirin  2. How are you currently taking this medication (dosage and times per day)? 200-25 mg 12 capsule twice daily.  3. Are you having a reaction (difficulty breathing--STAT)? No  4. What is your medication issue? Not taking it and needs to be switched to Aspirin  Pt voiced they took her off the 200-25mg  aspirin and now he's on the 81 mg dosage of Aspirin.  Please f/u

## 2015-10-23 NOTE — Telephone Encounter (Signed)
Thanks. cdm 

## 2015-10-23 NOTE — Telephone Encounter (Signed)
Spoke with pt. She saw Francis Dowseenee Ursuy, PA yesterday and when she got home noticed Aspirin-Dipyridamole was still on her med list.  Pt reports she is unable to afford this and is taking ASA 81 mg daily. (see phone note dated 8/7). Will update med list. Pt also wants to make Dr. Clifton JamesMcAlhany aware she is taking red yeast rice on days she is not taking simvastatin as directed by Dr. Para Marchuncan.  Pt reports she is currently taking simvastatin on Monday and Friday. She was unable to tolerate 3 days a week.

## 2015-11-25 ENCOUNTER — Ambulatory Visit (INDEPENDENT_AMBULATORY_CARE_PROVIDER_SITE_OTHER): Payer: Commercial Managed Care - HMO | Admitting: Family Medicine

## 2015-11-25 ENCOUNTER — Encounter: Payer: Self-pay | Admitting: Family Medicine

## 2015-11-25 DIAGNOSIS — M546 Pain in thoracic spine: Secondary | ICD-10-CM | POA: Diagnosis not present

## 2015-11-25 DIAGNOSIS — M549 Dorsalgia, unspecified: Secondary | ICD-10-CM | POA: Insufficient documentation

## 2015-11-25 NOTE — Progress Notes (Signed)
Cough.  Occ cough.  No fevers.  No sputum.  Not SOB.  Not on ACE.    She is having to do all of the home chores and she thought she has pulled a muscle in her back.  She is worried about her husband and she is the primary caregiver.  D/w pt.  His pulmonary status is worse overall.  She wants to continue as is for now, caring for her husband.    Still on simvastatin twice a week. She couldn't tolerate it more often.  Still on RYR, except when she takes simvastatin.  Rare use of lasix, noted used in months.    Meds, vitals, and allergies reviewed.   ROS: Per HPI unless specifically indicated in ROS section   GEN: nad, alert and oriented HEENT: mucous membranes moist NECK: supple w/o LA CV: rrr PULM: ctab, no inc wob ABD: soft, +bs EXT: no edema R mid back ttp, between the scapulas but lateral to the midline.  Midline not ttp

## 2015-11-25 NOTE — Progress Notes (Signed)
Pre visit review using our clinic review tool, if applicable. No additional management support is needed unless otherwise documented below in the visit note. 

## 2015-11-25 NOTE — Assessment & Plan Note (Signed)
Likely a pulled muscle. Discussed with patient. Lungs are clear. Likely not from statin or red yeast rice. See after visit summary. She is doing the best she can caring for her husband. Support offered. Update me as needed.

## 2015-11-25 NOTE — Patient Instructions (Addendum)
Likely a pulled muscle.  Try alternating ice and heat.  Go easy lifting groceries.   Take care.  Glad to see you.

## 2016-01-22 ENCOUNTER — Ambulatory Visit (INDEPENDENT_AMBULATORY_CARE_PROVIDER_SITE_OTHER): Payer: Medicare HMO | Admitting: *Deleted

## 2016-01-22 DIAGNOSIS — I441 Atrioventricular block, second degree: Secondary | ICD-10-CM

## 2016-01-24 LAB — CUP PACEART REMOTE DEVICE CHECK
Battery Impedance: 255 Ohm
Brady Statistic AP VS Percent: 0 %
Brady Statistic AS VS Percent: 0 %
Date Time Interrogation Session: 20180117133054
Implantable Lead Implant Date: 20131106
Implantable Lead Location: 753860
Implantable Lead Model: 5076
Lead Channel Impedance Value: 412 Ohm
Lead Channel Impedance Value: 547 Ohm
Lead Channel Pacing Threshold Pulse Width: 0.4 ms
Lead Channel Pacing Threshold Pulse Width: 0.4 ms
Lead Channel Setting Sensing Sensitivity: 2 mV
MDC IDC LEAD IMPLANT DT: 20131106
MDC IDC LEAD LOCATION: 753859
MDC IDC MSMT BATTERY REMAINING LONGEVITY: 104 mo
MDC IDC MSMT BATTERY VOLTAGE: 2.79 V
MDC IDC MSMT LEADCHNL RA PACING THRESHOLD AMPLITUDE: 0.5 V
MDC IDC MSMT LEADCHNL RA SENSING INTR AMPL: 2.8 mV
MDC IDC MSMT LEADCHNL RV PACING THRESHOLD AMPLITUDE: 1 V
MDC IDC PG IMPLANT DT: 20131106
MDC IDC SET LEADCHNL RA PACING AMPLITUDE: 2 V
MDC IDC SET LEADCHNL RV PACING AMPLITUDE: 2.5 V
MDC IDC SET LEADCHNL RV PACING PULSEWIDTH: 0.4 ms
MDC IDC STAT BRADY AP VP PERCENT: 35 %
MDC IDC STAT BRADY AS VP PERCENT: 65 %

## 2016-01-24 NOTE — Progress Notes (Signed)
Remote pacemaker transmission.   

## 2016-01-29 ENCOUNTER — Encounter: Payer: Self-pay | Admitting: Cardiology

## 2016-02-06 ENCOUNTER — Other Ambulatory Visit (HOSPITAL_COMMUNITY): Payer: Self-pay | Admitting: Interventional Radiology

## 2016-02-06 DIAGNOSIS — I771 Stricture of artery: Secondary | ICD-10-CM

## 2016-02-13 ENCOUNTER — Ambulatory Visit (HOSPITAL_COMMUNITY): Payer: Medicare HMO

## 2016-02-13 ENCOUNTER — Ambulatory Visit (HOSPITAL_COMMUNITY)
Admission: RE | Admit: 2016-02-13 | Discharge: 2016-02-13 | Disposition: A | Discharge status: Home / Self Care | Payer: Medicare HMO | Source: Ambulatory Visit | Attending: Interventional Radiology | Admitting: Interventional Radiology

## 2016-02-13 ENCOUNTER — Encounter (HOSPITAL_COMMUNITY): Payer: Self-pay

## 2016-02-13 DIAGNOSIS — I6522 Occlusion and stenosis of left carotid artery: Secondary | ICD-10-CM | POA: Diagnosis not present

## 2016-02-13 DIAGNOSIS — I771 Stricture of artery: Secondary | ICD-10-CM | POA: Insufficient documentation

## 2016-02-13 LAB — POCT I-STAT CREATININE: Creatinine, Ser: 0.9 mg/dL (ref 0.44–1.00)

## 2016-02-13 MED ORDER — IOPAMIDOL (ISOVUE-370) INJECTION 76%
INTRAVENOUS | Status: AC
  Administered 2016-02-13: 50 mL
  Filled 2016-02-13: qty 50

## 2016-02-25 ENCOUNTER — Telehealth (HOSPITAL_COMMUNITY): Payer: Self-pay

## 2016-02-25 NOTE — Telephone Encounter (Signed)
Pt agreed to f/u in 1 yr with cta head and neck. AW 

## 2016-02-25 NOTE — Progress Notes (Signed)
Chief Complaint  Patient presents with  . Coronary Artery Disease    NO CP, SOB OR SWELLING.  Marland Kitchen Hyperlipidemia    History of Present Illness: 76 yo WF with h/o CAD with DES RCA in June 2011, HTN, hyperlipidemia, hypothyroidism, AV block s/p PPM, CVA and basilar artery stenosis here today for follow up. Left heart cath on 06/13/09 and she was found to have a severe stenosis in the RCA. There was minor disease in the LAD and Circumflex. I placed a single DES in the RCA. She was admitted to [facility] 08/10/11 with dizziness, chest pain and repeat cath showed stable disease. She was observed to have second degree AV block demonstrated with RBBB. Her coreg was stopped. She was discharged and wore an event monitor. This did not document arrhythmias or advanced AV block. There were no episodes of AV block documented. GXT was performed in our office 09/01/11. She was observed during the study to have a LBBB with exertion. She developed exertional symptoms of fatigue, SOB, and CP which corresponded to her LBBB. She was seen by Dr. Johney Frame and continued to have episodes of palpitations and dizziness with moderate activity. A permanent pacemaker was placed on 11/11/11 by Dr. Johney Frame. She has been undergoing cerebral angiograms for basilar artery stenosis and has had angiplasty of the basilar artery in August 2010. She is known to have an occluded left common carotid artery. She had a CVA in December 2012. She was Admitted to [facility] July 2015 with chest pain. Cardiac cath 07/27/13 with stable CAD. Cardiopulmonary stress test 09/12/13 with maximum HR 115 bpm with 7 minutes exercise.   She is here today for follow up. She is feeling well overall. No chest pain or dyspnea with exertion. Weight is stable. She is walking several days per week.   Primary Care Physician: Crawford Givens, MD  Past Medical History:  Diagnosis Date  . Arthritis    "fingers" (03/19/2013)  . Basilar artery stenosis    90%  . Bladder spasms   .  Blood transfusion 1960's   w/childbirth  . CAD (coronary artery disease)    a. 06/2009 cath: severe stenosis RCA with placement drug eluting stent;  b. 08/2011 cath: normal left main, luminal LAD irregularities, luminal LCx irregularities, luminal RCA irregularities and no ISR to mid-RCA DES, LVEF 60-65%; c. 02/2013 nonischemic CL, EF 79%.  . CVA (cerebral infarction) December 2012  . Family history of anesthesia complication    "first cousin had very fever and almost died" (2013/03/19)  . Fibromyalgia   . HLD (hyperlipidemia) 01/1997  . Hypertension   . Hypothyroidism 1990  . IC (interstitial cystitis)   . Labile hypertension 08/1991  . Left-sided carotid artery obstruction   . Malignant hyperthermia    "told to warn everybody before I had surgery that my 1st cousin had malignant hyperthermia; I've had symptoms of it too" (03/19/13)  . Obesity   . Pacemaker 11/11/2011   dual chamber pacemaker  . Pneumonia    "twice" (19-Mar-2013)  . Second degree Mobitz II AV block   . Stroke [facility])    "they tell me I've had some mini strokes; nothing that's bothered me though" (03-19-13)  . Stroke (HCC) 2017  . TIA (transient ischemic attack)    multiple/notes 08/02/2015  . TIA (transient ischemic attack)     Past Surgical History:  Procedure Laterality Date  . basilar artery angioplasty  09/04/08   sm right brachial hematoma  . Bilateral Vert & Subclavian  Angiogram  07/19/08   85-90% stenosis mid basilar art; occluded left vert artery; occluded left comm carotid artery w/ collat flow  . BLADDER SUSPENSION  1974  . CARDIAC CATHETERIZATION  1993   normal  . carotid arteriogram     bilat  . Carotid US  11/93   Occl L comm carotid  . Carotid US  4/98   L comm occl R-ok  . Carotid US  01/30/03   L occl  R less 40%  . CATARACT EXTRACTION W/ INTRAOCULAR LENS IMPLANT Bilateral 2007-2008  . Cath single vess dz  06/13/09   70% stenosis RCA EF 65-70%  . CHOLECYSTECTOMY  1985  . CORONARY ANGIOPLASTY WITH  STENT PLACEMENT  06/19/09  . DEXA  10/15/04   Troch -0.4 o/w pos  . EMG  1/01   LE, RUE normal  . EYE SURGERY    . INSERT / REPLACE / REMOVE PACEMAKER  11/11/2011   MDT Adapta L implanted by Dr Johney FrameAllred. [employer]  . LEFT HEART CATHETERIZATION WITH CORONARY ANGIOGRAM N/A 08/10/2011   Procedure: LEFT HEART CATHETERIZATION WITH CORONARY ANGIOGRAM;  Surgeon: Kathleene Hazelhristopher D McAlhany, MD;  [facility];  Service: Cardiovascular;  Laterality: N/A;  . LEFT HEART CATHETERIZATION WITH CORONARY ANGIOGRAM N/A 07/28/2013   Procedure: LEFT HEART CATHETERIZATION WITH CORONARY ANGIOGRAM;  Surgeon: Kathleene Hazelhristopher D McAlhany, MD;  [facility];  Service: Cardiovascular;  Laterality: N/A;  . PERMANENT PACEMAKER INSERTION N/A 11/11/2011   Procedure: PERMANENT PACEMAKER INSERTION;  Surgeon: Hillis RangeJames Allred, MD;  [facility];  Service: Cardiovascular;  Laterality: N/A;  . RENAL ANGIOPLASTY  1993   normal  . US RENAL/AORTA  12/93   Normal  . VAGINAL HYSTERECTOMY  1974   dysmennorhea  . Visual evoked response  11/93; 1/01   normal    Current Outpatient Prescriptions  Medication Sig Dispense Refill  . aspirin EC 81 MG tablet Take 1 tablet (81 mg total) by mouth daily. 90 tablet 3  . clopidogrel (PLAVIX) 75 MG tablet Take 1 tablet (75 mg total) by mouth daily. 90 tablet 3  . furosemide (LASIX) 40 MG tablet Take 40 mg by mouth daily as needed for edema (infrequent urination).     . lansoprazole (PREVACID) 15 MG capsule Take 15 mg by mouth daily as needed (reflux).     Marland Kitchen. levothyroxine (SYNTHROID, LEVOTHROID) 100 MCG tablet Take 1 tablet (100 mcg total) by mouth daily. (Patient taking differently: Take 100 mcg by mouth daily before breakfast. ) 90 tablet 3  . losartan-hydrochlorothiazide (HYZAAR) 100-25 MG tablet Take 1 tablet by mouth daily. 90 tablet 3  . nitroGLYCERIN (NITROSTAT) 0.4 MG SL tablet Place 0.4 mg under the tongue every 5 (five) minutes as needed for chest pain (MAX 3 TABLETS).     .  Phenazopyridine HCl (AZO TABS PO) Take 1 tablet by mouth daily as needed (bladder spasms).     . Red Yeast Rice 600 MG CAPS Take 600 mg by mouth 2 (two) times daily.    . simvastatin (ZOCOR) 10 MG tablet Take 1 tablet (10 mg total) by mouth once a week. Increase to twice weekly if tolerated. 30 tablet prn  . amLODipine (NORVASC) 5 MG tablet Take 1 tablet (5 mg total) by mouth 2 (two) times daily. 180 tablet 3   No current facility-administered medications for this visit.     Allergies  Allergen Reactions  . Anesthetics, Amide Other (See Comments)    REACTION:FEVER, SYNCOPE  . Codeine Nausea And  Vomiting and Other (See Comments)    Hallucinations  . Nitrofurantoin Nausea Only and Other (See Comments)    Hallucinations  . Orphenadrine Citrate Nausea And Vomiting and Other (See Comments)     Hallucination  . Pravastatin Other (See Comments)    Myalgias  . Simvastatin Other (See Comments)    myalgias  . Pentosan Polysulfate Sodium Rash and Other (See Comments)     rash/ severe headache    Social History   Social History  . Marital status: Married    Spouse name: N/A  . Number of children: 1  . Years of education: N/A   Occupational History  . Bookkeeper-part time   . retired Retired   Social History Main Topics  . Smoking status: Never Smoker  . Smokeless tobacco: Never Used  . Alcohol use No  . Drug use: No  . Sexual activity: Not Currently    Birth control/ protection: Post-menopausal   Other Topics Concern  . 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 Lung cancer Father   . Heart attack Mother   . Lymphoma Mother     s/p chemo  . Stroke Mother     x 2  . Diabetes Brother   . Breast cancer Maternal Aunt   . Colon cancer Neg Hx     Review of Systems:  As stated in  the HPI and otherwise negative.   BP (!) 160/90 (BP Location: Left Arm, Patient Position: Sitting, Cuff Size: Normal)   Pulse 66   Ht 4\' 11"  (1.499 m)   Wt 197 lb 12.8 oz (89.7 kg)   SpO2 97%   BMI 39.95 kg/m   Physical Examination: General: Well developed, well nourished, NAD  HEENT: OP clear, mucus membranes moist  SKIN: warm, dry. No rashes. Neuro: No focal deficits  Musculoskeletal: Muscle strength 5/5 all ext  Psychiatric: Mood and affect normal  Neck: No JVD, no carotid bruits, no thyromegaly, no lymphadenopathy.  Lungs:Clear bilaterally, no wheezes, rhonci, crackles Cardiovascular: Regular rate and rhythm. No murmurs, gallops or rubs. Abdomen:Soft. Bowel sounds present. Non-tender.  Extremities: No lower extremity edema. Pulses are 2 + in the bilateral DP/PT.  Cardiac cath 07/27/13: Left main: No obstructive disease.  Left Anterior Descending Artery: Large caliber vessel that courses to the apex. The mid vessel has diffuse 20% stenosis. There is a very small caliber diagonal branch with proximal 40% stenosis. There are no flow limiting lesions noted.  Circumflex Artery: Moderate caliber vessel with moderate caliber obtuse marginal branch. No obstructive disease.  Right Coronary Artery: Large caliber vessel with 20% mid stenosis, patent distal stent without restenosis.  Left Ventricular Angiogram: LVEF=65-70%.  Impression:  1. Single vessel CAD with patent stent RCA  2. Normal LV function  3. Non-cardiac chest pain   Echo 02/21/14: Left ventricle: The cavity size was normal. There was mild focal basal hypertrophy of the septum. Systolic function was normal. The estimated ejection fraction was in the range of 60% to 65%. Wall motion was normal; there were no regional wall motion abnormalities. - Aortic valve: Moderately calcified annulus. Trileaflet. Mild thickening and calcification, consistent with sclerosis. - Right ventricle: Pacer wire or catheter noted in  right ventricle. - Right atrium: Pacer wire or catheter noted in  right atrium. - Tricuspid valve: There was mild regurgitation. - Pulmonic valve: There was trivial regurgitation.  EKG:  EKG is not ordered today. The ekg ordered today demonstrates   Recent Labs: 05/28/2015: TSH 1.05 08/02/2015: ALT 18; Hemoglobin 15.4; Magnesium 2.1; Platelets 222 08/03/2015: BUN 13; Potassium 4.0; Sodium 142 02/13/2016: Creatinine, Ser 0.90   Lipid Panel    Component Value Date/Time   CHOL 169 08/03/2015 0439   TRIG 175 (H) 08/03/2015 0439   HDL 32 (L) 08/03/2015 0439   CHOLHDL 5.3 08/03/2015 0439   VLDL 35 08/03/2015 0439   LDLCALC 102 (H) 08/03/2015 0439   LDLDIRECT 145.0 10/01/2014 1336     Wt Readings from Last 3 Encounters:  02/26/16 197 lb 12.8 oz (89.7 kg)  11/25/15 198 lb 4 oz (89.9 kg)  10/22/15 190 lb (86.2 kg)     Other studies Reviewed: Additional studies/ records that were reviewed today include: . Review of the above records demonstrates:    Assessment and Plan:   1. CAD: No chest pain suggestive of angina. Cath July 2015 with stable CAD. No changes today. Continue ASA, Plavix and statin.   2. High grade symptomatic AV block: s/p PPM 11/10/11 per Dr. Johney Frame. Followed in EP clinic.    3. HTN: BP has been elevated. Will increase Norvasc to 5 mg po BID.    4. Chronic diastolic CHF: She is using Lasix prn. Weight is stable.   5. Aortic valve sclerosis: No stenosis.   Current medicines are reviewed at length with the patient today.  The patient does not have concerns regarding medicines.  The following changes have been made:  no change  Labs/ tests ordered today include:   No orders of the defined types were placed in this encounter.   Disposition:   FU with me in 12  months  Signed, Verne Carrow, MD 02/26/2016 11:33 AM    [facility] [address] [place], [place], [place]  [address] Phone: [phone]; Fax: [phone]

## 2016-02-26 ENCOUNTER — Ambulatory Visit (INDEPENDENT_AMBULATORY_CARE_PROVIDER_SITE_OTHER): Payer: Medicare HMO | Admitting: Cardiovascular Disease

## 2016-02-26 ENCOUNTER — Encounter: Payer: Self-pay | Admitting: Cardiovascular Disease

## 2016-02-26 VITALS — BP 160/90 | HR 66 | Ht 59.0 in | Wt 197.8 lb

## 2016-02-26 DIAGNOSIS — I2510 Atherosclerotic heart disease of native coronary artery without angina pectoris: Secondary | ICD-10-CM

## 2016-02-26 DIAGNOSIS — I5032 Chronic diastolic (congestive) heart failure: Secondary | ICD-10-CM | POA: Diagnosis not present

## 2016-02-26 DIAGNOSIS — I10 Essential (primary) hypertension: Secondary | ICD-10-CM | POA: Diagnosis not present

## 2016-02-26 DIAGNOSIS — I441 Atrioventricular block, second degree: Secondary | ICD-10-CM

## 2016-02-26 MED ORDER — AMLODIPINE BESYLATE 5 MG PO TABS: 5.0000 mg | ORAL_TABLET | Freq: Two times a day (BID) | ORAL | 3 refills | Status: DC

## 2016-02-26 NOTE — Patient Instructions (Signed)
Medication Instructions:  Your physician has recommended you make the following change in your medication:  Increase Norvasc to 5 mg by mouth twice daily.    Labwork: none  Testing/Procedures: none  Follow-Up: Your physician recommends that you schedule a follow-up appointment in: 12 months.  Please call our office in about 9 months to schedule this appointment.     Any Other Special Instructions Will Be Listed Below (If Applicable).     If you need a refill on your cardiac medications before your next appointment, please call your pharmacy.

## 2016-03-08 ENCOUNTER — Emergency Department (HOSPITAL_COMMUNITY)
Admission: EM | Admit: 2016-03-08 | Discharge: 2016-03-08 | Disposition: A | Discharge status: Home / Self Care | Payer: Medicare HMO | Attending: Emergency Medicine | Admitting: Emergency Medicine

## 2016-03-08 ENCOUNTER — Encounter (HOSPITAL_COMMUNITY): Payer: Self-pay | Admitting: Emergency Medicine

## 2016-03-08 ENCOUNTER — Emergency Department (HOSPITAL_COMMUNITY): Payer: Medicare HMO

## 2016-03-08 DIAGNOSIS — R0602 Shortness of breath: Secondary | ICD-10-CM | POA: Diagnosis not present

## 2016-03-08 DIAGNOSIS — I2510 Atherosclerotic heart disease of native coronary artery without angina pectoris: Secondary | ICD-10-CM | POA: Diagnosis not present

## 2016-03-08 DIAGNOSIS — R002 Palpitations: Secondary | ICD-10-CM | POA: Diagnosis present

## 2016-03-08 DIAGNOSIS — R079 Chest pain, unspecified: Secondary | ICD-10-CM | POA: Diagnosis not present

## 2016-03-08 DIAGNOSIS — F419 Anxiety disorder, unspecified: Secondary | ICD-10-CM | POA: Diagnosis not present

## 2016-03-08 DIAGNOSIS — Z8673 Personal history of transient ischemic attack (TIA), and cerebral infarction without residual deficits: Secondary | ICD-10-CM | POA: Diagnosis not present

## 2016-03-08 DIAGNOSIS — I10 Essential (primary) hypertension: Secondary | ICD-10-CM | POA: Insufficient documentation

## 2016-03-08 DIAGNOSIS — E039 Hypothyroidism, unspecified: Secondary | ICD-10-CM | POA: Diagnosis not present

## 2016-03-08 DIAGNOSIS — M940 Chondrocostal junction syndrome [Tietze]: Secondary | ICD-10-CM

## 2016-03-08 DIAGNOSIS — Z955 Presence of coronary angioplasty implant and graft: Secondary | ICD-10-CM | POA: Diagnosis not present

## 2016-03-08 DIAGNOSIS — Z7982 Long term (current) use of aspirin: Secondary | ICD-10-CM | POA: Insufficient documentation

## 2016-03-08 DIAGNOSIS — Z950 Presence of cardiac pacemaker: Secondary | ICD-10-CM | POA: Insufficient documentation

## 2016-03-08 LAB — I-STAT TROPONIN, ED
TROPONIN I, POC: 0 ng/mL (ref 0.00–0.08)
TROPONIN I, POC: 0.01 ng/mL (ref 0.00–0.08)
TROPONIN I, POC: 0.01 ng/mL (ref 0.00–0.08)

## 2016-03-08 LAB — BASIC METABOLIC PANEL
ANION GAP: 10 (ref 5–15)
BUN: 14 mg/dL (ref 6–20)
CALCIUM: 10.1 mg/dL (ref 8.9–10.3)
CHLORIDE: 101 mmol/L (ref 101–111)
CO2: 29 mmol/L (ref 22–32)
CREATININE: 0.99 mg/dL (ref 0.44–1.00)
GFR calc non Af Amer: 55 mL/min — ABNORMAL LOW (ref >60)
Glucose, Bld: 136 mg/dL — ABNORMAL HIGH (ref 65–99)
Potassium: 3.2 mmol/L — ABNORMAL LOW (ref 3.5–5.1)
SODIUM: 140 mmol/L (ref 135–145)

## 2016-03-08 LAB — CBC
HCT: 46 % (ref 36.0–46.0)
HEMOGLOBIN: 16 g/dL — AB (ref 12.0–15.0)
MCH: 30.5 pg (ref 26.0–34.0)
MCHC: 34.8 g/dL (ref 30.0–36.0)
MCV: 87.6 fL (ref 78.0–100.0)
Platelets: 234 10*3/uL (ref 150–400)
RBC: 5.25 MIL/uL — ABNORMAL HIGH (ref 3.87–5.11)
RDW: 12.9 % (ref 11.5–15.5)
WBC: 8.8 10*3/uL (ref 4.0–10.5)

## 2016-03-08 LAB — HEPATIC FUNCTION PANEL
ALBUMIN: 4.3 g/dL (ref 3.5–5.0)
ALT: 22 U/L (ref 14–54)
AST: 24 U/L (ref 15–41)
Alkaline Phosphatase: 60 U/L (ref 38–126)
Bilirubin, Direct: <0.1 mg/dL — ABNORMAL LOW (ref 0.1–0.5)
TOTAL PROTEIN: 7.3 g/dL (ref 6.5–8.1)
Total Bilirubin: 0.4 mg/dL (ref 0.3–1.2)

## 2016-03-08 LAB — D-DIMER, QUANTITATIVE (NOT AT ARMC): D DIMER QUANT: 0.39 ug{FEU}/mL (ref 0.00–0.50)

## 2016-03-08 LAB — BRAIN NATRIURETIC PEPTIDE: B NATRIURETIC PEPTIDE 5: 58.7 pg/mL (ref 0.0–100.0)

## 2016-03-08 MED ORDER — AMLODIPINE BESYLATE 5 MG PO TABS
5.0000 mg | ORAL_TABLET | Freq: Once | ORAL | Status: AC
  Administered 2016-03-08: 5 mg via ORAL
  Filled 2016-03-08: qty 1

## 2016-03-08 NOTE — ED Provider Notes (Signed)
[facility]-EMERGENCY DEPT Provider Note   CSN: [id number] Arrival date & time: 03/08/16  1450     History   Chief Complaint Chief Complaint  Patient presents with  . Chest Pain    HPI Kelsey Dunn is a 76 y.o. female.  PMH coronary artery disease S/P DES 2011, aortic stenosis, hypertension, hyperlipidemia, AV block S/P pacemaker, CVA with carotid and basilar artery stenosis. On 2/21 she was seen her cardiologist office found to have a blood pressure 160 over 90s and Norvasc was increased to 5 mg twice a day. Yesterday she was sitting in her home when she developed a chest pain which radiated to her back. She describes the pain as a quick stinging that comes and goes and is only present for short periods of time. She is not currently having any pain. The pain is associated with tiredness, palpitations, and pressure in her head. She has a cough which developed and she had her pacemaker placed, this cough has not changed. The pain is worse with walking when she turns in bed it improves when she sits still. She rates the pain 8/10. She denies shortness of breath, edema, headache, cough. She lives an active lifestyle and has not been sedentary recently. At baseline she has no chest pain with exertion and has mild shortness of breath with walking but not to the point where she has to stop and rest. 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       Past Medical History:  Diagnosis Date  . Arthritis    "fingers" (03-10-2013)  . Basilar artery stenosis    90%  . Bladder spasms   . Blood transfusion 1960's   w/childbirth  . CAD (coronary artery disease)    a. 06/2009 cath: severe stenosis RCA with placement drug eluting stent;  b. 08/2011 cath: normal left main, luminal LAD irregularities, luminal LCx irregularities, luminal RCA irregularities and no ISR to mid-RCA DES, LVEF 60-65%; c. 02/2013 nonischemic CL, EF 79%.  . CVA  (cerebral infarction) December 2012  . Family history of anesthesia complication    "first cousin had very fever and almost died" (03-10-13)  . Fibromyalgia   . HLD (hyperlipidemia) 01/1997  . Hypertension   . Hypothyroidism 1990  . IC (interstitial cystitis)   . Labile hypertension 08/1991  . Left-sided carotid artery obstruction   . Malignant hyperthermia    "told to warn everybody before I had surgery that my 1st cousin had malignant hyperthermia; I've had symptoms of it too" (March 10, 2013)  . Obesity   . Pacemaker 11/11/2011   dual chamber pacemaker  . Pneumonia    "twice" (03/10/2013)  . Second degree Mobitz II AV block   . Stroke [facility])    "they tell me I've had some mini strokes; nothing that's bothered me though" (2013-03-10)  . Stroke (HCC) 2017  . TIA (transient ischemic attack)    multiple/notes 08/02/2015  . TIA (transient ischemic attack)     Patient Active Problem List   Diagnosis Date Noted  . Back pain 11/25/2015  . Numbness 08/02/2015  . TIA (transient ischemic attack) 08/02/2015  . Stroke (HCC) 08/02/2015  . Tick bite 06/05/2015  . Dysuria 06/05/2015  . Neck pain on right side 10/01/2014  . Transient global amnesia 03/26/2014  . Memory loss   . Shortness of breath on exertion 08/06/2013  . Eyelid abnormality 06/23/2013  . Dysphagia, unspecified(787.20) 04/13/2013  . Osteopenia  03/11/2013  . Medicare annual wellness visit, initial 02/21/2013  . Irritation of eyelid 05/01/2012  . Metabolic syndrome 02/17/2012  . Obesity, unspecified 02/17/2012  . Impaired exercise tolerance 02/17/2012  . Fall at home 12/21/2011  . Heartburn 12/10/2011  . Pacemaker-[employer]tron[employer] 11/13/2011  . Second degree Mobitz II AV block 10/30/2011  . Unstable angina (HCC) 08/10/2011  . Heart block AV second degree 08/10/2011  . Dizziness 12/23/2010  . Hypothyroidism 08/07/2009  . CAD, NATIVE VESSEL 07/04/2009  . PVD 06/10/2009  . DYSPNEA 06/10/2009  . CHEST PAIN-PRECORDIAL 06/10/2009  .  FOOT PAIN, LEFT 02/18/2009  . Occlusion and stenosis of basilar artery 07/12/2008  . Essential hypertension 07/10/2008  . GAIT DISTURBANCE 07/10/2008  . Vitamin D deficiency 06/28/2008  . HYPERGLYCEMIA 06/28/2008  . Pure hypercholesterolemia 12/28/2006  . COMMON MIGRAINE 04/28/2006  . DETACHED RETINA 04/28/2006  . MENOPAUSAL SYNDROME 04/28/2006  . FIBROMYALGIA 04/28/2006    Past Surgical History:  Procedure Laterality Date  . basilar artery angioplasty  09/04/08   sm right brachial hematoma  . Bilateral Vert & Subclavian Angiogram  07/19/08   85-90% stenosis mid basilar art; occluded left vert artery; occluded left comm carotid artery w/ collat flow  . BLADDER SUSPENSION  1974  . CARDIAC CATHETERIZATION  1993   normal  . carotid arteriogram     bilat  . Carotid US  11/93   Occl L comm carotid  . Carotid US  4/98   L comm occl R-ok  . Carotid US  01/30/03   L occl  R less 40%  . CATARACT EXTRACTION W/ INTRAOCULAR LENS IMPLANT Bilateral 2007-2008  . Cath single vess dz  06/13/09   70% stenosis RCA EF 65-70%  . CHOLECYSTECTOMY  1985  . CORONARY ANGIOPLASTY WITH STENT PLACEMENT  06/19/09  . DEXA  10/15/04   Troch -0.4 o/w pos  . EMG  1/01   LE, RUE normal  . EYE SURGERY    . INSERT / REPLACE / REMOVE PACEMAKER  11/11/2011   MDT Adapta L implanted by Dr Johney Frame. [employer]  . LEFT HEART CATHETERIZATION WITH CORONARY ANGIOGRAM N/A 08/10/2011   Procedure: LEFT HEART CATHETERIZATION WITH CORONARY ANGIOGRAM;  Surgeon: Kathleene Hazel, MD;  Location[facility] CATH LAB;  Service: Cardiovascular;  Laterality: N/A;  . LEFT HEART CATHETERIZATION WITH CORONARY ANGIOGRAM N/A 07/28/2013   Procedure: LEFT HEART CATHETERIZATION WITH CORONARY ANGIOGRAM;  Surgeon: Kathleene Hazel, MD;  Location: [facility] CATH LAB;  Service: Cardiovascular;  Laterality: N/A;  . PERMANENT PACEMAKER INSERTION N/A 11/11/2011   Procedure: PERMANENT PACEMAKER INSERTION;  Surgeon: Hillis Range, MD;  Location: [facility] CATH LAB;   Service: Cardiovascular;  Laterality: N/A;  . RENAL ANGIOPLASTY  1993   normal  . US RENAL/AORTA  12/93   Normal  . VAGINAL HYSTERECTOMY  1974   dysmennorhea  . Visual evoked response  11/93; 1/01   normal    OB History    No data available       Home Medications    Prior to Admission medications   Medication Sig Start Date End Date Taking? Authorizing Provider  acetaminophen (TYLENOL) 500 MG tablet Take 1,000 mg by mouth every 6 (six) hours as needed for moderate pain.    Yes Historical Provider, MD  amLODipine (NORVASC) 5 MG tablet Take 1 tablet (5 mg total) by mouth 2 (two) times daily. 02/26/16 05/26/16 Yes Kathleene Hazel, MD  aspirin EC 81 MG tablet Take 1 tablet (81 mg total) by mouth daily. 10/23/15  Yes Kathleene Hazelhristopher D McAlhany, MD  clopidogrel (PLAVIX) 75 MG tablet Take 1 tablet (75 mg total) by mouth daily. 06/04/15  Yes Joaquim NamGraham S [name], MD  lansoprazole (PREVACID) 15 MG capsule Take 15 mg by mouth daily as needed (reflux).    Yes Historical Provider, MD  levothyroxine (SYNTHROID, LEVOTHROID) 100 MCG tablet Take 1 tablet (100 mcg total) by mouth daily. Patient taking differently: Take 100 mcg by mouth daily before breakfast.  06/04/15  Yes Joaquim NamGraham S [name], MD  losartan-hydrochlorothiazide (HYZAAR) 100-25 MG tablet Take 1 tablet by mouth daily. 06/04/15  Yes Joaquim NamGraham S [name], MD  nitroGLYCERIN (NITROSTAT) 0.4 MG SL tablet Place 0.4 mg under the tongue every 5 (five) minutes as needed for chest pain (MAX 3 TABLETS).    Yes Historical Provider, MD  Red Yeast Rice 600 MG CAPS Take 600 mg by mouth See admin instructions. Takes 2 times daily, except mon and fri-takes 1 time daily only   Yes Historical Provider, MD  simvastatin (ZOCOR) 10 MG tablet Take 1 tablet (10 mg total) by mouth once a week. Increase to twice weekly if tolerated. Patient taking differently: Take 10 mg by mouth 2 (two) times a week. Takes on mon and fri only 08/12/15  Yes Joaquim NamGraham S [name], MD    Family  History Family History  Problem Relation Age of Onset  . Lung cancer Father   . Heart attack Mother   . Lymphoma Mother     s/p chemo  . Stroke Mother     x 2  . Diabetes Brother   . Breast cancer Maternal Aunt   . Colon cancer Neg Hx     Social History Social History  Substance Use Topics  . Smoking status: Never Smoker  . Smokeless tobacco: Never Used  . Alcohol use No     Allergies   Anesthetics, amide; Codeine; Nitrofurantoin; Orphenadrine citrate; Pravastatin; Simvastatin; and Pentosan polysulfate sodium   Review of Systems Review of Systems  Constitutional: Negative for chills and fever.  Respiratory: Positive for cough. Negative for chest tightness and shortness of breath.   Cardiovascular: Positive for chest pain and palpitations. Negative for leg swelling.  Musculoskeletal: Negative for myalgias.  Neurological: Negative for facial asymmetry, speech difficulty, weakness, numbness and headaches.  All other systems reviewed and are negative.   Physical Exam Updated Vital Signs BP 144/72   Pulse 82   Temp 98.4 F (36.9 C) (Oral)   Resp 15   Ht 4\' 11"  (1.499 m)   Wt 89.4 kg   SpO2 96%   BMI 39.79 kg/m   Physical Exam  Constitutional: She appears well-developed and well-nourished. No distress.  HENT:  Head: Normocephalic and atraumatic.  Eyes: Conjunctivae and EOM are normal. No scleral icterus.  Neck: Normal range of motion. Neck supple.  Cardiovascular: Normal rate and regular rhythm.   Murmur heard. Late diastolic murmur  Pulmonary/Chest: Effort normal. No respiratory distress. She has no wheezes. She has no rales.  Abdominal: Soft. She exhibits no distension. There is no tenderness. There is no guarding.  Neurological: She is alert. No cranial nerve deficit or sensory deficit.  Strength in upper and lower extremities intact  Skin: She is not diaphoretic.  Psychiatric: She has a normal mood and affect. Her behavior is normal.   ED Treatments /  Results  Labs (all labs ordered are listed, but only abnormal results are displayed) Labs Reviewed  BASIC METABOLIC PANEL - Abnormal; Notable for the following:  Result Value   Potassium 3.2 (*)    Glucose, Bld 136 (*)    GFR calc non Af Amer 55 (*)    All other components within normal limits  CBC - Abnormal; Notable for the following:    RBC 5.25 (*)    Hemoglobin 16.0 (*)    All other components within normal limits  BRAIN NATRIURETIC PEPTIDE  HEPATIC FUNCTION PANEL  I-STAT TROPOININ, ED    EKG  EKG Interpretation  Date/Time:  Sunday March 08 2016 14:58:44 EST Ventricular Rate:  88 PR Interval:    QRS Duration: 176 QT Interval:  424 QTC Calculation: 513 R Axis:   -68 Text Interpretation:  Ventricular-paced rhythm Abnormal ECG patient has pacemaker  Confirmed by YAO  MD, DAVID ([id number]) on 03/08/2016 3:44:16 PM       Radiology Dg Chest 2 View  Result Date: 03/08/2016 CLINICAL DATA:  Pt has had back pain and chest pain since Thursday -- BP has been elevated also in past few days. Pt states today her blood pressure was more elevated, stopped by a fire station and had them check her BP and she was sent here. EXAM: CHEST  2 VIEW COMPARISON:  03/26/2014 FINDINGS: Left-sided transvenous pacemaker leads to the right atrium and right ventricle. Heart size is normal. Lungs are clear. No focal consolidations, pleural effusions, or pulmonary edema. Surgical clips are noted in the right upper quadrant of the abdomen. IMPRESSION: No evidence for acute cardiopulmonary abnormality. Electronically Signed   By: Norva Pavlov M.D.   On: 03/08/2016 15:52    Procedures Procedures (including critical care time)  Medications Ordered in ED Medications - No data to display   Initial Impression / Assessment and Plan / ED Course  I have reviewed the triage vital signs and the nursing notes.  Pertinent labs & imaging results that were available during my care of the patient were reviewed  by me and considered in my medical decision making (see chart for details).   76 year old woman with past medical history coronary artery disease, hypertension, hyperlipidemia, AV block status post pacemaker, reticulocyte stenosis presents with intermittent chest pain radiating into her back which started yesterday. At presentation she was found to have blood pressure 153/102, HR 105, and SpO2 98% on room air. Presentation is concerning for acute myocardial infarction, costochondritis and pulmonary embolism. Negative troponin x 3 and EKG showed no acute ST changes. She has a low risk well's score (tachycardia) and PERC score could not rule out PE. D-dimer 0.39 ruled out PE. Chest xray did not reveal mediastinal widening or acute cardiopulmonary disease. Symptoms may be related to costochondritis, was advised to take OTC NSAID and discharged to home with plans for close follow up. Discussed return precautions.    Final Clinical Impressions(s) / ED Diagnoses   Final diagnoses:  None    New Prescriptions New Prescriptions   No medications on file     Eulah Pont, MD 03/08/16 2213    Charlynne Pander, MD 03/09/16 [phone]

## 2016-03-08 NOTE — ED Notes (Signed)
Patient at xray

## 2016-03-08 NOTE — ED Triage Notes (Signed)
Pt has had back pain and chest pain since Thursday -- BP has been elevated also in past few days --recently had norvasc increased recently per Dr. Sherlynn StallsMacalhaney. Pt in no acute distress

## 2016-03-08 NOTE — Discharge Instructions (Signed)
It was a pleasure to meet you today Kelsey Dunn. Your pain may be related to a muscle between your ribs that has become irritated from the coughing. The best thing for this is taking an NSAID such as aleve. Keep up the good work with taking your blood pressure medications. Schedule a follow up appointment to see your primary care doctor in the next week. Seek medical attention if your chest pain becomes worse in intensity or becomes constant or if you have any difficulty breathing.

## 2016-03-09 ENCOUNTER — Telehealth: Payer: Self-pay | Admitting: Cardiovascular Disease

## 2016-03-09 NOTE — Telephone Encounter (Signed)
Thanks

## 2016-03-09 NOTE — Telephone Encounter (Signed)
New message    Pt is calling because she was in ER on Sunday afternoon. She was told to inform Dr. Clifton JamesMcalhany some things and would like RN to call her.

## 2016-03-09 NOTE — Telephone Encounter (Signed)
I spoke with Kelsey Dunn. She reports she was in the ED on Sunday and was told to let Dr. Clifton JamesMcAlhany know. Kelsey Dunn reports her blood pressure starting running high on Thursday. It remained elevated over the weekend. She felt horrible and not herself.  ED evaluation was OK.  She is following up with Dr. Para Marchuncan on Friday.  I told her to keep this appt and let us know if Dr. Para Marchuncan felt she needed follow up with Dr. Clifton JamesMcAlhany.  I told her I would let Dr. Clifton JamesMcAlhany know about her ED visit.

## 2016-03-13 ENCOUNTER — Ambulatory Visit (INDEPENDENT_AMBULATORY_CARE_PROVIDER_SITE_OTHER): Payer: Medicare HMO | Admitting: Family Medicine

## 2016-03-13 ENCOUNTER — Encounter: Payer: Self-pay | Admitting: Family Medicine

## 2016-03-13 DIAGNOSIS — R0789 Other chest pain: Secondary | ICD-10-CM | POA: Diagnosis not present

## 2016-03-13 NOTE — Assessment & Plan Note (Signed)
She is clearly improved in the meantime.  She has mult vascular risk factors and sources of stress, but she doesn't appear to have a new/acute vascular cause and she denies being anxious.  I don't see a clear cause at this point.  She clearly feels better.  Her BP is reasonable.  I didn't change her meds at this point.  I'll route to cards as a FYI, but I don't see anything else that needs to happen at this point.  She agrees. I suspect her home pulse ox variation is likely artifact and not a correct reading.  >25 minutes spent in face to face time with patient, >50% spent in counselling or coordination of care.  Her R hand sx appear to be old/chronic and she asked about using an OTC brace as needed.  This is reasonable.  She'll update me as needed.

## 2016-03-13 NOTE — Progress Notes (Signed)
Pre visit review using our clinic review tool, if applicable. No additional management support is needed unless otherwise documented below in the visit note. 

## 2016-03-13 NOTE — Progress Notes (Signed)
ER f/u.  eval done for elevated BP and CP.  Sx started 1-2 days prior to ER eval.  03/07/16 was clearly the worst, she went to ER on 03/08/16.  "My color was terrible and I just couldn't go."    D dimer neg. Trop neg.  Thought to be due to costochondritis with possible anxiety affecting BP.  She didn't think anxiety was the main issue.  She does have mult stressors noted.    She has had BP meds recently changed, with inc in amlodipine.  She is still able to do her shopping and house work.  After walking she has noted dec in pulse ox transiently after going to get the mail but she didn't have dec pulse ox here in with walking.  See below.  We discussed.  I question if her home meter is malfunctioning.  If her hands are cold that could affect the reading, especially in the AM.  She agreed.  See AVS.   She is better in the meantime, but she still wasn't certain about what caused the event.  She had stinging from the chest into her back.  She doesn't have sx now and "I'm beginning to feel like I'm better."  She globally feels stronger.    She is adamant about this not being anxiety related.    She has some baseline numbness and discomfort in the R hand- longstanding.  Asking about using a brace as needed.    PMH and SH reviewed  ROS: Per HPI unless specifically indicated in ROS section   Meds, vitals, and allergies reviewed.   GEN: nad, alert and oriented HEENT: mucous membranes moist NECK: supple w/o LA CV: rrr.  PULM: ctab, no inc wob ABD: soft, +bs EXT: no edema SKIN: no acute rash Normal R hand grip but some baseline tingling in the palm- old finding per patient report.   She maintained pulse ox >95% with walking here in the clinic.

## 2016-03-13 NOTE — Patient Instructions (Signed)
Okay to use a wrist brace as needed in the meantime.  Update me as needed.  I wouldn't change your BP meds for now.  Bring in your pulse ox to you next visit to calibrate it. Take care.  Glad to see you.

## 2016-04-22 ENCOUNTER — Ambulatory Visit (INDEPENDENT_AMBULATORY_CARE_PROVIDER_SITE_OTHER): Payer: Medicare HMO | Admitting: *Deleted

## 2016-04-22 ENCOUNTER — Telehealth: Payer: Self-pay | Admitting: Cardiology

## 2016-04-22 DIAGNOSIS — I441 Atrioventricular block, second degree: Secondary | ICD-10-CM

## 2016-04-22 NOTE — Telephone Encounter (Signed)
LMOVM reminding pt to send remote transmission.   

## 2016-04-23 NOTE — Progress Notes (Signed)
Remote pacemaker transmission.   

## 2016-04-24 ENCOUNTER — Encounter: Payer: Self-pay | Admitting: Cardiology

## 2016-04-30 LAB — CUP PACEART REMOTE DEVICE CHECK
Brady Statistic AS VP Percent: 65.3 %
Brady Statistic AS VS Percent: 0.1 % — CL
Date Time Interrogation Session: 20180426121317
Implantable Lead Implant Date: 20131106
Implantable Lead Location: 753860
Implantable Lead Model: 5076
Implantable Lead Model: 5092
Lead Channel Impedance Value: 412 Ohm
Lead Channel Impedance Value: 547 Ohm
Lead Channel Pacing Threshold Amplitude: 0.5 V
Lead Channel Pacing Threshold Amplitude: 1 V
Lead Channel Pacing Threshold Pulse Width: 0.4 ms
Lead Channel Sensing Intrinsic Amplitude: 2.8 mV
Lead Channel Setting Pacing Pulse Width: 0.4 ms
MDC IDC LEAD IMPLANT DT: 20131106
MDC IDC LEAD LOCATION: 753859
MDC IDC MSMT LEADCHNL RV PACING THRESHOLD PULSEWIDTH: 0.4 ms
MDC IDC PG IMPLANT DT: 20131106
MDC IDC SET LEADCHNL RA PACING AMPLITUDE: 2 V
MDC IDC SET LEADCHNL RV PACING AMPLITUDE: 2.5 V
MDC IDC SET LEADCHNL RV SENSING SENSITIVITY: 2 mV
MDC IDC STAT BRADY AP VP PERCENT: 34.6 %
MDC IDC STAT BRADY AP VS PERCENT: 0.1 % — AB

## 2016-07-09 ENCOUNTER — Other Ambulatory Visit: Payer: Self-pay | Admitting: Family Medicine

## 2016-07-22 ENCOUNTER — Ambulatory Visit (INDEPENDENT_AMBULATORY_CARE_PROVIDER_SITE_OTHER): Payer: Medicare HMO | Admitting: *Deleted

## 2016-07-22 ENCOUNTER — Telehealth: Payer: Self-pay | Admitting: Cardiovascular Disease

## 2016-07-22 DIAGNOSIS — I441 Atrioventricular block, second degree: Secondary | ICD-10-CM

## 2016-07-22 NOTE — Progress Notes (Signed)
Remote pacemaker transmission.   

## 2016-07-22 NOTE — Telephone Encounter (Signed)
New message     Is her device transmitting ?   [phone]

## 2016-07-22 NOTE — Telephone Encounter (Signed)
Spoke w/ pt and informed that we have not received her remote transmission. Informed her to plug up her wirex adapter b/c the land line phone will not work any more. Pt verbalized understanding.

## 2016-07-23 ENCOUNTER — Encounter: Payer: Self-pay | Admitting: Cardiology

## 2016-07-23 LAB — CUP PACEART REMOTE DEVICE CHECK
Battery Impedance: 255 Ohm
Battery Remaining Longevity: 105 mo
Brady Statistic AP VP Percent: 34 %
Brady Statistic AS VS Percent: 0 %
Date Time Interrogation Session: 20180718125447
Implantable Lead Implant Date: 20131106
Implantable Lead Location: 753859
Implantable Pulse Generator Implant Date: 20131106
Lead Channel Impedance Value: 423 Ohm
Lead Channel Pacing Threshold Amplitude: 1.125 V
Lead Channel Setting Pacing Amplitude: 2 V
Lead Channel Setting Sensing Sensitivity: 2 mV
MDC IDC LEAD IMPLANT DT: 20131106
MDC IDC LEAD LOCATION: 753860
MDC IDC MSMT BATTERY VOLTAGE: 2.79 V
MDC IDC MSMT LEADCHNL RA PACING THRESHOLD AMPLITUDE: 0.5 V
MDC IDC MSMT LEADCHNL RA PACING THRESHOLD PULSEWIDTH: 0.4 ms
MDC IDC MSMT LEADCHNL RV IMPEDANCE VALUE: 573 Ohm
MDC IDC MSMT LEADCHNL RV PACING THRESHOLD PULSEWIDTH: 0.4 ms
MDC IDC SET LEADCHNL RV PACING AMPLITUDE: 2.5 V
MDC IDC SET LEADCHNL RV PACING PULSEWIDTH: 0.4 ms
MDC IDC STAT BRADY AP VS PERCENT: 0 %
MDC IDC STAT BRADY AS VP PERCENT: 66 %

## 2016-07-25 ENCOUNTER — Other Ambulatory Visit: Payer: Self-pay | Admitting: Family Medicine

## 2016-07-27 NOTE — Telephone Encounter (Signed)
Electronic refill request.  Last office visit:   03/13/2016 acute Last Filled:   Levothyroxine 90 tablet 3 06/04/2015  Last Filled:   Losartan/HCTZ 90 tablet 3 06/04/2015  Please advise.

## 2016-07-28 NOTE — Telephone Encounter (Signed)
Please schedule appointment as instructed. 

## 2016-07-28 NOTE — Telephone Encounter (Signed)
Sent.  Schedule medicare wellness visit if not already scheduled. Thanks.

## 2016-07-28 NOTE — Telephone Encounter (Signed)
Left detailed message on voicemail.  

## 2016-08-04 NOTE — Telephone Encounter (Signed)
Scheduled 08/19/16

## 2016-08-19 ENCOUNTER — Ambulatory Visit (INDEPENDENT_AMBULATORY_CARE_PROVIDER_SITE_OTHER): Payer: Medicare HMO

## 2016-08-19 VITALS — BP 130/68 | HR 60 | Temp 97.8°F | Ht 59.5 in | Wt 193.2 lb

## 2016-08-19 DIAGNOSIS — E038 Other specified hypothyroidism: Secondary | ICD-10-CM

## 2016-08-19 DIAGNOSIS — R718 Other abnormality of red blood cells: Secondary | ICD-10-CM | POA: Diagnosis not present

## 2016-08-19 DIAGNOSIS — E7800 Pure hypercholesterolemia, unspecified: Secondary | ICD-10-CM

## 2016-08-19 DIAGNOSIS — E559 Vitamin D deficiency, unspecified: Secondary | ICD-10-CM

## 2016-08-19 DIAGNOSIS — I10 Essential (primary) hypertension: Secondary | ICD-10-CM | POA: Diagnosis not present

## 2016-08-19 DIAGNOSIS — D582 Other hemoglobinopathies: Secondary | ICD-10-CM | POA: Diagnosis not present

## 2016-08-19 DIAGNOSIS — R739 Hyperglycemia, unspecified: Secondary | ICD-10-CM

## 2016-08-19 DIAGNOSIS — Z0000 Encounter for general adult medical examination without abnormal findings: Secondary | ICD-10-CM | POA: Diagnosis not present

## 2016-08-19 LAB — COMPREHENSIVE METABOLIC PANEL
ALT: 15 U/L (ref 0–35)
AST: 16 U/L (ref 0–37)
Albumin: 4.5 g/dL (ref 3.5–5.2)
Alkaline Phosphatase: 53 U/L (ref 39–117)
BILIRUBIN TOTAL: 0.8 mg/dL (ref 0.2–1.2)
BUN: 17 mg/dL (ref 6–23)
CHLORIDE: 102 meq/L (ref 96–112)
CO2: 33 meq/L — AB (ref 19–32)
Calcium: 9.8 mg/dL (ref 8.4–10.5)
Creatinine, Ser: 0.89 mg/dL (ref 0.40–1.20)
GFR: 65.93 mL/min (ref >60.00)
GLUCOSE: 103 mg/dL — AB (ref 70–99)
POTASSIUM: 3.8 meq/L (ref 3.5–5.1)
Sodium: 141 mEq/L (ref 135–145)
Total Protein: 6.2 g/dL (ref 6.0–8.3)

## 2016-08-19 LAB — LIPID PANEL
CHOL/HDL RATIO: 4
Cholesterol: 165 mg/dL (ref 0–200)
HDL: 41.1 mg/dL (ref >39.00)
LDL Cholesterol: 93 mg/dL (ref 0–99)
NONHDL: 123.5
Triglycerides: 152 mg/dL — ABNORMAL HIGH (ref 0.0–149.0)
VLDL: 30.4 mg/dL (ref 0.0–40.0)

## 2016-08-19 LAB — CBC WITH DIFFERENTIAL/PLATELET
BASOS ABS: 0 10*3/uL (ref 0.0–0.1)
Basophils Relative: 0.6 % (ref 0.0–3.0)
EOS PCT: 2.4 % (ref 0.0–5.0)
Eosinophils Absolute: 0.1 10*3/uL (ref 0.0–0.7)
HEMATOCRIT: 44.6 % (ref 36.0–46.0)
Hemoglobin: 15 g/dL (ref 12.0–15.0)
LYMPHS ABS: 2 10*3/uL (ref 0.7–4.0)
LYMPHS PCT: 32.5 % (ref 12.0–46.0)
MCHC: 33.6 g/dL (ref 30.0–36.0)
MCV: 89.9 fl (ref 78.0–100.0)
MONOS PCT: 7.6 % (ref 3.0–12.0)
Monocytes Absolute: 0.5 10*3/uL (ref 0.1–1.0)
NEUTROS ABS: 3.4 10*3/uL (ref 1.4–7.7)
NEUTROS PCT: 56.9 % (ref 43.0–77.0)
PLATELETS: 255 10*3/uL (ref 150.0–400.0)
RBC: 4.96 Mil/uL (ref 3.87–5.11)
RDW: 13.5 % (ref 11.5–15.5)
WBC: 6.1 10*3/uL (ref 4.0–10.5)

## 2016-08-19 LAB — VITAMIN D 25 HYDROXY (VIT D DEFICIENCY, FRACTURES): VITD: 25.95 ng/mL — AB (ref 30.00–100.00)

## 2016-08-19 LAB — TSH: TSH: 0.99 u[IU]/mL (ref 0.35–4.50)

## 2016-08-19 LAB — HEMOGLOBIN A1C: HEMOGLOBIN A1C: 5.5 % (ref 4.6–6.5)

## 2016-08-19 NOTE — Progress Notes (Signed)
Pre visit review using our clinic review tool, if applicable. No additional management support is needed unless otherwise documented below in the visit note. 

## 2016-08-19 NOTE — Progress Notes (Signed)
Subjective:   Kelsey Dunn is a 76 y.o. female who presents for Medicare Annual (Subsequent) preventive examination.  Review of Systems:  N/A Cardiac Risk Factors include: advanced age (>4men, >20 women);obesity (BMI >30kg/m2);hypertension;dyslipidemia     Objective:     Vitals: BP 130/68 (BP Location: Right Arm, Patient Position: Sitting, Cuff Size: Normal)   Pulse 60   Temp 97.8 F (36.6 C) (Oral)   Ht 4' 11.5" (1.511 m) Comment: no shoes  Wt 193 lb 4 oz (87.7 kg)   SpO2 98%   BMI 38.38 kg/m   Body mass index is 38.38 kg/m.   Tobacco History  Smoking Status  . Never Smoker  Smokeless Tobacco  . Never Used     Counseling given: No   Past Medical History:  Diagnosis Date  . Arthritis    "fingers" (03/10/13)  . Basilar artery stenosis    90%  . Bladder spasms   . Blood transfusion 1960's   w/childbirth  . CAD (coronary artery disease)    a. 06/2009 cath: severe stenosis RCA with placement drug eluting stent;  b. 08/2011 cath: normal left main, luminal LAD irregularities, luminal LCx irregularities, luminal RCA irregularities and no ISR to mid-RCA DES, LVEF 60-65%; c. 02/2013 nonischemic CL, EF 79%.  . CVA (cerebral infarction) December 2012  . Family history of anesthesia complication    "first cousin had very fever and almost died" (March 10, 2013)  . Fibromyalgia   . HLD (hyperlipidemia) 01/1997  . Hypertension   . Hypothyroidism 1990  . IC (interstitial cystitis)   . Labile hypertension 08/1991  . Left-sided carotid artery obstruction   . Malignant hyperthermia    "told to warn everybody before I had surgery that my 1st cousin had malignant hyperthermia; I've had symptoms of it too" (03/10/13)  . Obesity   . Pacemaker 11/11/2011   dual chamber pacemaker  . Pneumonia    "twice" (March 10, 2013)  . Second degree Mobitz II AV block   . Stroke [facility])    "they tell me I've had some mini strokes; nothing that's bothered me though" (2013/03/10)  . Stroke (HCC) 2017    . TIA (transient ischemic attack)    multiple/notes 08/02/2015  . TIA (transient ischemic attack)    Past Surgical History:  Procedure Laterality Date  . basilar artery angioplasty  09/04/08   sm right brachial hematoma  . Bilateral Vert & Subclavian Angiogram  07/19/08   85-90% stenosis mid basilar art; occluded left vert artery; occluded left comm carotid artery w/ collat flow  . BLADDER SUSPENSION  1974  . CARDIAC CATHETERIZATION  1993   normal  . carotid arteriogram     bilat  . Carotid US  11/93   Occl L comm carotid  . Carotid US  4/98   L comm occl R-ok  . Carotid US  01/30/03   L occl  R less 40%  . CATARACT EXTRACTION W/ INTRAOCULAR LENS IMPLANT Bilateral 2007-2008  . Cath single vess dz  06/13/09   70% stenosis RCA EF 65-70%  . CHOLECYSTECTOMY  1985  . CORONARY ANGIOPLASTY WITH STENT PLACEMENT  06/19/09  . DEXA  10/15/04   Troch -0.4 o/w pos  . EMG  1/01   LE, RUE normal  . EYE SURGERY    . INSERT / REPLACE / REMOVE PACEMAKER  11/11/2011   MDT Adapta L implanted by Dr Johney Frame. [employer]  . LEFT HEART CATHETERIZATION WITH CORONARY ANGIOGRAM N/A 08/10/2011   Procedure: LEFT HEART CATHETERIZATION WITH  CORONARY ANGIOGRAM;  Surgeon: Kathleene Hazel, MD;  Location: [facility] CATH LAB;  Service: Cardiovascular;  Laterality: N/A;  . LEFT HEART CATHETERIZATION WITH CORONARY ANGIOGRAM N/A 07/28/2013   Procedure: LEFT HEART CATHETERIZATION WITH CORONARY ANGIOGRAM;  Surgeon: Kathleene Hazel, MD;  Location: [facility] CATH LAB;  Service: Cardiovascular;  Laterality: N/A;  . PERMANENT PACEMAKER INSERTION N/A 11/11/2011   Procedure: PERMANENT PACEMAKER INSERTION;  Surgeon: Hillis Range, MD;  Location[facility] CATH LAB;  Service: Cardiovascular;  Laterality: N/A;  . RENAL ANGIOPLASTY  1993   normal  . US RENAL/AORTA  12/93   Normal  . VAGINAL HYSTERECTOMY  1974   dysmennorhea  . Visual evoked response  11/93; 1/01   normal   Family History  Problem Relation Age of Onset  . Lung cancer  Father   . Heart attack Mother   . Lymphoma Mother        s/p chemo  . Stroke Mother        x 2  . Diabetes Brother   . Breast cancer Maternal Aunt   . Colon cancer Neg Hx    History  Sexual Activity  . Sexual activity: Not Currently  . Birth control/ protection: Post-menopausal    Outpatient Encounter Prescriptions as of 08/19/2016  Medication Sig  . acetaminophen (TYLENOL) 500 MG tablet Take 1,000 mg by mouth every 6 (six) hours as needed for moderate pain.   Marland Kitchen aspirin EC 81 MG tablet Take 1 tablet (81 mg total) by mouth daily.  . clopidogrel (PLAVIX) 75 MG tablet TAKE 1 TABLET (75 MG TOTAL) BY MOUTH DAILY.  Marland Kitchen lansoprazole (PREVACID) 15 MG capsule Take 15 mg by mouth daily as needed (reflux).   Marland Kitchen levothyroxine (SYNTHROID, LEVOTHROID) 100 MCG tablet TAKE 1 TABLET (100 MCG TOTAL) BY MOUTH DAILY.  Marland Kitchen losartan-hydrochlorothiazide (HYZAAR) 100-25 MG tablet TAKE 1 TABLET BY MOUTH DAILY.  . nitroGLYCERIN (NITROSTAT) 0.4 MG SL tablet Place 0.4 mg under the tongue every 5 (five) minutes as needed for chest pain (MAX 3 TABLETS).   . Red Yeast Rice 600 MG CAPS Take 600 mg by mouth See admin instructions. Takes 2 times daily, except mon and fri-takes 1 time daily only  . [DISCONTINUED] simvastatin (ZOCOR) 10 MG tablet Take 1 tablet (10 mg total) by mouth once a week. Increase to twice weekly if tolerated. (Patient taking differently: Take 10 mg by mouth 2 (two) times a week. Takes on mon and fri only)  . amLODipine (NORVASC) 5 MG tablet Take 1 tablet (5 mg total) by mouth 2 (two) times daily.   No facility-administered encounter medications on file as of 08/19/2016.     Activities of Daily Living In your present state of health, do you have any difficulty performing the following activities: 08/19/2016  Hearing? N  Vision? N  Difficulty concentrating or making decisions? N  Walking or climbing stairs? N  Dressing or bathing? N  Doing errands, shopping? N  Preparing Food and eating ? N    Using the Toilet? N  In the past six months, have you accidently leaked urine? N  Do you have problems with loss of bowel control? N  Managing your Medications? N  Managing your Finances? N  Housekeeping or managing your Housekeeping? N  Some recent data might be hidden    Patient Care Team: Joaquim Nam, MD as PCP - General (Family Medicine) Kathleene Hazel, MD as Consulting Physician (Cardiology) Hillis Range, MD as Consulting Physician (Cardiology) Galen Manila, MD as Referring  Physician (Ophthalmology)    Assessment:     Hearing Screening   125Hz  250Hz  500Hz  1000Hz  2000Hz  3000Hz  4000Hz  6000Hz  8000Hz   Right ear:   40 40 40  40    Left ear:   40 40 40  40    Vision Screening Comments: Last vision exam in August 2017   Exercise Activities and Dietary recommendations Current Exercise Habits: Home exercise routine, Type of exercise: walking, Time (Minutes): 30, Frequency (Times/Week): 7, Weekly Exercise (Minutes/Week): 210, Intensity: Mild, Exercise limited by: None identified  Goals    . Increase physical activity          Starting 08/19/2016, I will continue to walk at least 30 min daily.       Fall Risk Fall Risk  08/19/2016 05/28/2015 02/20/2013  Falls in the past year? No No No   Depression Screen PHQ 2/9 Scores 08/19/2016 05/28/2015 02/20/2013  PHQ - 2 Score 1 0 0  PHQ- 9 Score 6 - -     Cognitive Function MMSE - Mini Mental State Exam 08/19/2016 05/28/2015  Orientation to time 5 5  Orientation to Place 5 5  Registration 3 3  Attention/ Calculation 0 0  Recall 3 3  Language- name 2 objects 0 0  Language- repeat 1 1  Language- follow 3 step command 3 3  Language- read & follow direction 0 0  Write a sentence 0 0  Copy design 0 0  Total score 20 20     PLEASE NOTE: A Mini-Cog screen was completed. Maximum score is 20. A value of 0 denotes this part of Folstein MMSE was not completed or the patient failed this part of the Mini-Cog screening.    Mini-Cog Screening Orientation to Time - Max 5 pts Orientation to Place - Max 5 pts Registration - Max 3 pts Recall - Max 3 pts Language Repeat - Max 1 pts Language Follow 3 Step Command - Max 3 pts     Immunization History  Administered Date(s) Administered  . Influenza Split 11/05/2010  . Influenza,inj,Quad PF,36+ Mos 10/13/2012, 10/12/2013, 10/12/2014, 10/10/2015  . Pneumococcal Conjugate-13 05/28/2015  . Pneumococcal Polysaccharide-23 09/05/2008  . Td 12/05/1997   Screening Tests Health Maintenance  Topic Date Due  . INFLUENZA VACCINE  04/04/2017 (Originally 08/05/2016)  . MAMMOGRAM  04/09/2017 (Originally 04/11/2015)  . COLON CANCER SCREENING ANNUAL FOBT  06/04/2017 (Originally 06/05/2016)  . TETANUS/TDAP  12/04/2017 (Originally 12/06/2007)  . DEXA SCAN  Completed  . PNA vac Low Risk Adult  Completed      Plan:     I have personally reviewed and addressed the Medicare Annual Wellness questionnaire and have noted the following in the patient's chart:  A. Medical and social history B. Use of alcohol, tobacco or illicit drugs  C. Current medications and supplements D. Functional ability and status E.  Nutritional status F.  Physical activity G. Advance directives H. List of other physicians I.  Hospitalizations, surgeries, and ER visits in previous 12 months J.  Vitals K. Screenings to include hearing, vision, cognitive, depression L. Referrals and appointments - none  In addition, I have reviewed and discussed with patient certain preventive protocols, quality metrics, and best practice recommendations. A written personalized care plan for preventive services as well as general preventive health recommendations were provided to patient.  See attached scanned questionnaire for additional information.   Signed,   Randa EvensLesia Ivette Castronova, MHA, BS, LPN Health Coach

## 2016-08-19 NOTE — Progress Notes (Signed)
PCP notes:   Health maintenance:  Flu vaccine - addressed Mammogram- addressed Colon cancer screening - FOBT kit provided to patient Tetanus - postponed/insurance  Abnormal screenings:   Depression score: 6  Patient concerns:   None  Nurse concerns:  None  Next PCP appt:   08/24/16 @ 0845  I reviewed health advisor's note, was available for consultation, and agree with documentation and plan. Loura Pardon MD

## 2016-08-19 NOTE — Patient Instructions (Signed)
[name] , Thank you for taking time to come for your Medicare Wellness Visit. I appreciate your ongoing commitment to your health goals. Please review the following plan we discussed and let me know if I can assist you in the future.   These are the goals we discussed: Goals    . Increase physical activity          Starting 08/19/2016, I will continue to walk at least 30 min daily.        This is a list of the screening recommended for you and due dates:  Health Maintenance  Topic Date Due  . Flu Shot  04/04/2017*  . Mammogram  04/09/2017*  . Stool Blood Test  06/04/2017*  . Tetanus Vaccine  12/04/2017*  . DEXA scan (bone density measurement)  Completed  . Pneumonia vaccines  Completed  *Topic was postponed. The date shown is not the original due date.   Preventive Care for Adults  A healthy lifestyle and preventive care can promote health and wellness. Preventive health guidelines for adults include the following key practices.  . A routine yearly physical is a good way to check with your health care provider about your health and preventive screening. It is a chance to share any concerns and updates on your health and to receive a thorough exam.  . Visit your dentist for a routine exam and preventive care every 6 months. Brush your teeth twice a day and floss once a day. Good oral hygiene prevents tooth decay and gum disease.  . The frequency of eye exams is based on your age, health, family medical history, use  of contact lenses, and other factors. Follow your health care provider's ecommendations for frequency of eye exams.  . Eat a healthy diet. Foods like vegetables, fruits, whole grains, low-fat dairy products, and lean protein foods contain the nutrients you need without too many calories. Decrease your intake of foods high in solid fats, added sugars, and salt. Eat the right amount of calories for you. Get information about a proper diet from your health care provider,  if necessary.  . Regular physical exercise is one of the most important things you can do for your health. Most adults should get at least 150 minutes of moderate-intensity exercise (any activity that increases your heart rate and causes you to sweat) each week. In addition, most adults need muscle-strengthening exercises on 2 or more days a week.  Silver Sneakers may be a benefit available to you. To determine eligibility, you may visit the website: www.silversneakers.com or contact program at (435)[phone] Mon-Fri between 8AM-8PM.   . Maintain a healthy weight. The body mass index (BMI) is a screening tool to identify possible weight problems. It provides an estimate of body fat based on height and weight. Your health care provider can find your BMI and can help you achieve or maintain a healthy weight.   For adults 20 years and older: ? A BMI below 18.5 is considered underweight. ? A BMI of 18.5 to 24.9 is normal. ? A BMI of 25 to 29.9 is considered overweight. ? A BMI of 30 and above is considered obese.   . Maintain normal blood lipids and cholesterol levels by exercising and minimizing your intake of saturated fat. Eat a balanced diet with plenty of fruit and vegetables. Blood tests for lipids and cholesterol should begin at age 74 and be repeated every 5 years. If your lipid or cholesterol levels are high, you are over 50,  or you are at high risk for heart disease, you may need your cholesterol levels checked more frequently. Ongoing high lipid and cholesterol levels should be treated with medicines if diet and exercise are not working.  . If you smoke, find out from your health care provider how to quit. If you do not use tobacco, please do not start.  . If you choose to drink alcohol, please do not consume more than 2 drinks per day. One drink is considered to be 12 ounces (355 mL) of beer, 5 ounces (148 mL) of wine, or 1.5 ounces (44 mL) of liquor.  . If you are 58-65 years old, ask  your health care provider if you should take aspirin to prevent strokes.  . Use sunscreen. Apply sunscreen liberally and repeatedly throughout the day. You should seek shade when your shadow is shorter than you. Protect yourself by wearing long sleeves, pants, a wide-brimmed hat, and sunglasses year round, whenever you are outdoors.  . Once a month, do a whole body skin exam, using a mirror to look at the skin on your back. Tell your health care provider of new moles, moles that have irregular borders, moles that are larger than a pencil eraser, or moles that have changed in shape or color.

## 2016-08-24 ENCOUNTER — Encounter: Payer: Self-pay | Admitting: Family Medicine

## 2016-08-24 ENCOUNTER — Other Ambulatory Visit: Payer: Medicare HMO

## 2016-08-24 ENCOUNTER — Ambulatory Visit (INDEPENDENT_AMBULATORY_CARE_PROVIDER_SITE_OTHER): Payer: Medicare HMO | Admitting: Family Medicine

## 2016-08-24 VITALS — BP 130/68 | HR 60 | Temp 97.8°F | Resp 16 | Ht 59.5 in | Wt 193.2 lb

## 2016-08-24 DIAGNOSIS — E039 Hypothyroidism, unspecified: Secondary | ICD-10-CM

## 2016-08-24 DIAGNOSIS — E785 Hyperlipidemia, unspecified: Secondary | ICD-10-CM | POA: Diagnosis not present

## 2016-08-24 DIAGNOSIS — I10 Essential (primary) hypertension: Secondary | ICD-10-CM

## 2016-08-24 DIAGNOSIS — E559 Vitamin D deficiency, unspecified: Secondary | ICD-10-CM | POA: Diagnosis not present

## 2016-08-24 DIAGNOSIS — Z7189 Other specified counseling: Secondary | ICD-10-CM

## 2016-08-24 DIAGNOSIS — Z0000 Encounter for general adult medical examination without abnormal findings: Secondary | ICD-10-CM | POA: Diagnosis not present

## 2016-08-24 DIAGNOSIS — Z1211 Encounter for screening for malignant neoplasm of colon: Secondary | ICD-10-CM

## 2016-08-24 MED ORDER — VITAMIN D 50 MCG (2000 UT) PO CAPS: 2000.0000 [IU] | ORAL_CAPSULE | Freq: Every day | ORAL | Status: DC

## 2016-08-24 NOTE — Patient Instructions (Signed)
Don't change your meds for now and update me as needed.  Take care.  Glad to see you. 

## 2016-08-24 NOTE — Progress Notes (Signed)
Hypertension:    Using medication without problems or lightheadedness: yes Chest pain with exertion:no Edema:no Short of breath:no  Elevated Cholesterol: Using medications without problems:yes Muscle aches: no Diet compliance:yes Exercise: housework, walking.   Statin intolerant.  About to tolerate RYR.    Low vit D d/w pt.  See med list.    Hypothyroidism.  TSH wnl, no neck mass, compliant with med.    She is worried about her husband at baseline.  She is still doing all the housework, etc.  Her husband can't help out much.  D/w pt.  It is a constant strain, but she is trying to work through the situation.  Support offered.  Depression score: 6 prev noted but this is likely situational, d/w pt.   Flu vaccine - d/w pt.   Mammogram- dw pt, encouraged.  She declined at this point.  She does manual checks.   Colon cancer screening - FOBT kit provided to patient prev.  Tetanus - postponed/insurance, d/w pt.  Advance directive- husband or son equally designated if patient were incapacitated.  D/w pt.    Meds, vitals, and allergies reviewed.   PMH and SH reviewed  ROS: Per HPI unless specifically indicated in ROS section   GEN: nad, alert and oriented HEENT: mucous membranes moist NECK: supple w/o LA, no tmg CV: rrr. PULM: ctab, no inc wob ABD: soft, +bs EXT: no edema SKIN: no acute rash

## 2016-08-25 ENCOUNTER — Other Ambulatory Visit (INDEPENDENT_AMBULATORY_CARE_PROVIDER_SITE_OTHER): Payer: Medicare HMO

## 2016-08-25 DIAGNOSIS — Z1211 Encounter for screening for malignant neoplasm of colon: Secondary | ICD-10-CM

## 2016-08-25 DIAGNOSIS — E785 Hyperlipidemia, unspecified: Secondary | ICD-10-CM | POA: Insufficient documentation

## 2016-08-25 DIAGNOSIS — Z7189 Other specified counseling: Secondary | ICD-10-CM | POA: Insufficient documentation

## 2016-08-25 LAB — FECAL OCCULT BLOOD, IMMUNOCHEMICAL: FECAL OCCULT BLD: NEGATIVE

## 2016-08-25 NOTE — Assessment & Plan Note (Signed)
Flu vaccine - d/w pt.   Mammogram- dw pt, encouraged.  She declined at this point.  She does manual checks.   Colon cancer screening - FOBT kit provided to patient prev.  Tetanus - postponed/insurance, d/w pt.  Advance directive- husband or son equally designated if patient were incapacitated.  D/w pt.

## 2016-08-25 NOTE — Addendum Note (Signed)
Addended by: Alvina Chou on: 08/25/2016 03:26 PM   Modules accepted: Orders

## 2016-08-25 NOTE — Assessment & Plan Note (Signed)
TSH wnl, continue as is.  She agrees.  

## 2016-08-25 NOTE — Assessment & Plan Note (Signed)
Controlled, continue as is.  No change in meds.  Labs d/w pt. D/w pt about diet and exercise and gradual weight loss.

## 2016-08-25 NOTE — Assessment & Plan Note (Signed)
Restart replacement, d/w pt.  She agrees.

## 2016-08-25 NOTE — Assessment & Plan Note (Signed)
No change in meds.  Labs d/w pt. D/w pt about diet and exercise and gradual weight loss.  Statin intolerant.

## 2016-08-25 NOTE — Assessment & Plan Note (Signed)
Advance directive- husband or son equally designated ifpatient wereincapacitated. D/w pt. 

## 2016-10-21 ENCOUNTER — Telehealth: Payer: Self-pay | Admitting: Cardiology

## 2016-10-21 ENCOUNTER — Ambulatory Visit (INDEPENDENT_AMBULATORY_CARE_PROVIDER_SITE_OTHER): Payer: Medicare HMO | Admitting: *Deleted

## 2016-10-21 DIAGNOSIS — I441 Atrioventricular block, second degree: Secondary | ICD-10-CM

## 2016-10-21 NOTE — Telephone Encounter (Signed)
Spoke with pt and reminded pt of remote transmission that is due today. Pt verbalized understanding.   

## 2016-10-22 ENCOUNTER — Ambulatory Visit (INDEPENDENT_AMBULATORY_CARE_PROVIDER_SITE_OTHER): Payer: Medicare HMO

## 2016-10-22 DIAGNOSIS — Z23 Encounter for immunization: Secondary | ICD-10-CM

## 2016-10-22 NOTE — Progress Notes (Signed)
Remote pacemaker transmission.   

## 2016-10-23 ENCOUNTER — Encounter: Payer: Self-pay | Admitting: Cardiology

## 2016-10-23 NOTE — Progress Notes (Signed)
Letter  

## 2016-11-13 LAB — CUP PACEART REMOTE DEVICE CHECK
Battery Impedance: 279 Ohm
Battery Remaining Longevity: 102 mo
Battery Voltage: 2.78 V
Brady Statistic AP VP Percent: 35 %
Implantable Lead Implant Date: 20131106
Implantable Lead Location: 753859
Implantable Lead Model: 5076
Implantable Lead Model: 5092
Implantable Pulse Generator Implant Date: 20131106
Lead Channel Impedance Value: 412 Ohm
Lead Channel Impedance Value: 547 Ohm
Lead Channel Pacing Threshold Amplitude: 1 V
Lead Channel Pacing Threshold Pulse Width: 0.4 ms
Lead Channel Setting Pacing Amplitude: 2 V
Lead Channel Setting Pacing Amplitude: 2.5 V
Lead Channel Setting Pacing Pulse Width: 0.4 ms
Lead Channel Setting Sensing Sensitivity: 2 mV
MDC IDC LEAD IMPLANT DT: 20131106
MDC IDC LEAD LOCATION: 753860
MDC IDC MSMT LEADCHNL RA PACING THRESHOLD AMPLITUDE: 0.375 V
MDC IDC MSMT LEADCHNL RV PACING THRESHOLD PULSEWIDTH: 0.4 ms
MDC IDC SESS DTM: 20181017135355
MDC IDC STAT BRADY AP VS PERCENT: 0 %
MDC IDC STAT BRADY AS VP PERCENT: 65 %
MDC IDC STAT BRADY AS VS PERCENT: 0 %

## 2017-01-18 ENCOUNTER — Encounter: Payer: Self-pay | Admitting: Family Medicine

## 2017-01-18 ENCOUNTER — Ambulatory Visit (INDEPENDENT_AMBULATORY_CARE_PROVIDER_SITE_OTHER): Payer: Medicare HMO | Admitting: Family Medicine

## 2017-01-18 DIAGNOSIS — H109 Unspecified conjunctivitis: Secondary | ICD-10-CM | POA: Diagnosis not present

## 2017-01-18 MED ORDER — POLYMYXIN B-TRIMETHOPRIM 10000-0.1 UNIT/ML-% OP SOLN: 1.0000 [drp] | Freq: Four times a day (QID) | OPHTHALMIC | 0 refills | Status: DC

## 2017-01-18 NOTE — Progress Notes (Signed)
She is chronically worried about her husband who is chronically ill, and he has f/u with pulmonary pending.  D/w pt.    She has been sick for about 6 days.  Cough.  Temp up to 100.3, better with tylenol.  B eye discharged noted.  She isn't coughing as much this AM, but was sleeping in a recliner last night due to cough.  She has been using warm compresses on her eyes.  Her vision is still at baseline, when she gets the eye discharge out.  She feels better overall but is most bothered by eye discharge and irritation.   See AVS.   Meds, vitals, and allergies reviewed.   ROS: Per HPI unless specifically indicated in ROS section   GEN: nad, alert and oriented HEENT: mucous membranes moist, tm w/o erythema, nasal exam w/o erythema, clear discharge noted,  OP with mild cobblestoning, sinuses not ttp B conjunctival irritation with some discharge noted.  NECK: supple w/o LA CV: rrr.   PULM: ctab, no inc wob EXT: no edema

## 2017-01-18 NOTE — Patient Instructions (Signed)
Start the eyedrops and use delsym as needed for the cough.  Update us as needed.  Take care.  Glad to see you.

## 2017-01-19 DIAGNOSIS — H109 Unspecified conjunctivitis: Secondary | ICD-10-CM | POA: Insufficient documentation

## 2017-01-19 NOTE — Assessment & Plan Note (Signed)
Her lungs are clear and she is feeling better overall.  Okay for outpatient follow-up.  The presumption was that she initially had a benign viral illness that is resolving.  She has subsequently developed bilateral conjunctivitis which still could be viral but the possibility exists that it could be bacterial.  She does not have watery discharge typical of a viral infection.  She has thick/more purulent discharge.  Reasonable to start Polytrim and update us as needed.  Routine cautions given.  Questions answered.

## 2017-01-20 ENCOUNTER — Ambulatory Visit (INDEPENDENT_AMBULATORY_CARE_PROVIDER_SITE_OTHER): Payer: Medicare HMO | Admitting: *Deleted

## 2017-01-20 DIAGNOSIS — I441 Atrioventricular block, second degree: Secondary | ICD-10-CM

## 2017-01-21 NOTE — Progress Notes (Signed)
Remote pacemaker transmission.   

## 2017-01-22 ENCOUNTER — Encounter: Payer: Self-pay | Admitting: Cardiology

## 2017-01-23 ENCOUNTER — Other Ambulatory Visit: Payer: Self-pay | Admitting: Family Medicine

## 2017-01-29 LAB — CUP PACEART REMOTE DEVICE CHECK
Battery Impedance: 328 Ohm
Battery Remaining Longevity: 97 mo
Battery Voltage: 2.79 V
Brady Statistic AS VP Percent: 65 %
Implantable Lead Implant Date: 20131106
Implantable Lead Location: 753859
Implantable Lead Model: 5076
Implantable Lead Model: 5092
Implantable Pulse Generator Implant Date: 20131106
Lead Channel Impedance Value: 418 Ohm
Lead Channel Pacing Threshold Amplitude: 0.5 V
Lead Channel Pacing Threshold Pulse Width: 0.4 ms
Lead Channel Setting Pacing Amplitude: 2 V
Lead Channel Setting Pacing Amplitude: 2.5 V
Lead Channel Setting Pacing Pulse Width: 0.4 ms
Lead Channel Setting Sensing Sensitivity: 2 mV
MDC IDC LEAD IMPLANT DT: 20131106
MDC IDC LEAD LOCATION: 753860
MDC IDC MSMT LEADCHNL RA PACING THRESHOLD PULSEWIDTH: 0.4 ms
MDC IDC MSMT LEADCHNL RV IMPEDANCE VALUE: 595 Ohm
MDC IDC MSMT LEADCHNL RV PACING THRESHOLD AMPLITUDE: 0.875 V
MDC IDC SESS DTM: 20190116131007
MDC IDC STAT BRADY AP VP PERCENT: 35 %
MDC IDC STAT BRADY AP VS PERCENT: 0 %
MDC IDC STAT BRADY AS VS PERCENT: 0 %

## 2017-02-02 ENCOUNTER — Ambulatory Visit (INDEPENDENT_AMBULATORY_CARE_PROVIDER_SITE_OTHER): Payer: Medicare HMO | Admitting: Internal Medicine

## 2017-02-02 ENCOUNTER — Encounter: Payer: Self-pay | Admitting: Internal Medicine

## 2017-02-02 VITALS — BP 134/80 | HR 76 | Temp 97.6°F | Wt 188.0 lb

## 2017-02-02 DIAGNOSIS — J3089 Other allergic rhinitis: Secondary | ICD-10-CM | POA: Diagnosis not present

## 2017-02-02 MED ORDER — METHYLPREDNISOLONE ACETATE 80 MG/ML IJ SUSP
80.0000 mg | Freq: Once | INTRAMUSCULAR | Status: AC
  Administered 2017-02-02: 80 mg via INTRAMUSCULAR

## 2017-02-02 NOTE — Progress Notes (Signed)
Subjective:    Patient ID: CAMIKA MARSICO, female    DOB: April 10, 1940, 77 y.o.   MRN: [id number]  HPI  Pt presents to the clinic today with c/o dry eyes, ear pain, runny nose and cough. This started 3-4 days ago. She describes the ear pain as fullness. She is blowing clear mucous out of her nose. The cough is non productive. She denies fever, chills or body aches. She has tried Tylenol and Delsym with some relief. She has no history of allergies but has had sick contacts. She was recently treated for bacterial conjunctivitis with complete resolution of symptoms.   Review of Systems      Past Medical History:  Diagnosis Date  . Arthritis    "fingers" (Mar 25, 2013)  . Basilar artery stenosis    90%  . Bladder spasms   . Blood transfusion 1960's   w/childbirth  . CAD (coronary artery disease)    a. 06/2009 cath: severe stenosis RCA with placement drug eluting stent;  b. 08/2011 cath: normal left main, luminal LAD irregularities, luminal LCx irregularities, luminal RCA irregularities and no ISR to mid-RCA DES, LVEF 60-65%; c. 02/2013 nonischemic CL, EF 79%.  . CVA (cerebral infarction) December 2012  . Family history of anesthesia complication    "first cousin had very fever and almost died" (Mar 25, 2013)  . Fibromyalgia   . HLD (hyperlipidemia) 01/1997  . Hypertension   . Hypothyroidism 1990  . IC (interstitial cystitis)   . Labile hypertension 08/1991  . Left-sided carotid artery obstruction   . Malignant hyperthermia    "told to warn everybody before I had surgery that my 1st cousin had malignant hyperthermia; I've had symptoms of it too" (03/25/13)  . Obesity   . Pacemaker 11/11/2011   dual chamber pacemaker  . Pneumonia    "twice" (03/25/13)  . Second degree Mobitz II AV block   . Stroke [facility])    "they tell me I've had some mini strokes; nothing that's bothered me though" (2013/03/25)  . Stroke (HCC) 2017  . TIA (transient ischemic attack)    multiple/notes 08/02/2015  . TIA  (transient ischemic attack)     Current Outpatient Medications  Medication Sig Dispense Refill  . acetaminophen (TYLENOL) 500 MG tablet Take 1,000 mg by mouth every 6 (six) hours as needed for moderate pain.     Marland Kitchen aspirin EC 81 MG tablet Take 1 tablet (81 mg total) by mouth daily. 90 tablet 3  . Cholecalciferol (VITAMIN D) 2000 units CAPS Take 1 capsule (2,000 Units total) by mouth daily.    . clopidogrel (PLAVIX) 75 MG tablet TAKE 1 TABLET (75 MG TOTAL) BY MOUTH DAILY. 90 tablet 2  . lansoprazole (PREVACID) 15 MG capsule Take 15 mg by mouth daily as needed (reflux).     Marland Kitchen levothyroxine (SYNTHROID, LEVOTHROID) 100 MCG tablet TAKE 1 TABLET (100 MCG TOTAL) BY MOUTH DAILY. 90 tablet 1  . losartan-hydrochlorothiazide (HYZAAR) 100-25 MG tablet TAKE 1 TABLET BY MOUTH DAILY. 90 tablet 1  . nitroGLYCERIN (NITROSTAT) 0.4 MG SL tablet Place 0.4 mg under the tongue every 5 (five) minutes as needed for chest pain (MAX 3 TABLETS).     . Red Yeast Rice 600 MG CAPS Take 600 mg by mouth See admin instructions. Takes 2 times daily, except mon and fri-takes 1 time daily only    . amLODipine (NORVASC) 5 MG tablet Take 1 tablet (5 mg total) by mouth 2 (two) times daily. 180 tablet 3  . trimethoprim-polymyxin b (POLYTRIM)  ophthalmic solution Place 1 drop into both eyes 4 (four) times daily. (Patient not taking: Reported on 02/02/2017) 10 mL 0   No current facility-administered medications for this visit.     Allergies  Allergen Reactions  . Anesthetics, Amide Other (See Comments)    REACTION:FEVER, SYNCOPE  . Codeine Nausea And Vomiting and Other (See Comments)    Hallucinations  . Nitrofurantoin Nausea Only and Other (See Comments)    Hallucinations  . Orphenadrine Citrate Nausea And Vomiting and Other (See Comments)     Hallucination  . Pravastatin Other (See Comments)    Myalgias  . Simvastatin Other (See Comments)    myalgias  . Pentosan Polysulfate Sodium Rash and Other (See Comments)     rash/  severe headache    Family History  Problem Relation Age of Onset  . Lung cancer Father   . Heart attack Mother   . Lymphoma Mother        s/p chemo  . Stroke Mother        x 2  . Diabetes Brother   . Breast cancer Maternal Aunt   . Colon cancer Neg Hx     Social History   Socioeconomic History  . Marital status: Married    Spouse name: Not on file  . Number of children: 1  . Years of education: Not on file  . Highest education level: Not on file  Social Needs  . Financial resource strain: Not on file  . Food insecurity - worry: Not on file  . Food insecurity - inability: Not on file  . Transportation needs - medical: Not on file  . Transportation needs - non-medical: Not on file  Occupational History  . Occupation: Bookkeeper-part time  . Occupation: retired    Associate Professor: RETIRED  Tobacco Use  . Smoking status: Never Smoker  . Smokeless tobacco: Never Used  Substance and Sexual Activity  . Alcohol use: No    Alcohol/week: 0.0 oz  . Drug use: No  . Sexual activity: Not Currently    Birth control/protection: Post-menopausal  Other Topics Concern  . 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  HEENT: Pt reports ear pain, runny nose. Denies eye pain, eye redness, ringing in the ears, wax buildup, nasal congestion, bloody nose, or sore throat. Respiratory: Pt reports cough. Denies difficulty breathing, shortness of breath, or sputum production.    No other specific complaints in a complete review of systems (except as listed in HPI above).  Objective:   Physical Exam   BP 134/80   Pulse 76   Temp 97.6 F (36.4 C) (Oral)   Wt 188 lb (85.3 kg)   SpO2 (!) 66%   BMI 37.34 kg/m  Wt Readings from Last 3 Encounters:  02/02/17 188 lb (85.3  kg)  01/18/17 186 lb 8 oz (84.6 kg)  08/24/16 193 lb 4 oz (87.7 kg)    General: Appears her stated age, in NAD. HEENT: Head: normal shape and size, no sinus tenderness noted; Eyes: sclera white, no icterus, conjunctiva pink, PERRLA and EOMs intact; Right Ear: moderate cerumen; Left Ear: Tm's gray and intact, normal light reflex; Nose: mucosa pink and moist, septum midline; Throat/Mouth: Teeth present, mucosa pink and moist, + PND, no exudate, lesions  or ulcerations noted.  Neck:  No adenopathy noted. Pulmonary/Chest: Normal effort and positive vesicular breath sounds. No respiratory distress. No wheezes, rales or ronchi noted.    BMET    Component Value Date/Time   NA 141 08/19/2016 1035   K 3.8 08/19/2016 1035   CL 102 08/19/2016 1035   CO2 33 (H) 08/19/2016 1035   GLUCOSE 103 (H) 08/19/2016 1035   BUN 17 08/19/2016 1035   CREATININE 0.89 08/19/2016 1035   CREATININE 0.82 06/02/2011 1106   CALCIUM 9.8 08/19/2016 1035   GFRNONAA 55 (L) 03/08/2016 1508   GFRAA >60 03/08/2016 1508    Lipid Panel     Component Value Date/Time   CHOL 165 08/19/2016 1035   TRIG 152.0 (H) 08/19/2016 1035   HDL 41.10 08/19/2016 1035   CHOLHDL 4 08/19/2016 1035   VLDL 30.4 08/19/2016 1035   LDLCALC 93 08/19/2016 1035    CBC    Component Value Date/Time   WBC 6.1 08/19/2016 1035   RBC 4.96 08/19/2016 1035   HGB 15.0 08/19/2016 1035   HCT 44.6 08/19/2016 1035   PLT 255.0 08/19/2016 1035   MCV 89.9 08/19/2016 1035   MCH 30.5 03/08/2016 1508   MCHC 33.6 08/19/2016 1035   RDW 13.5 08/19/2016 1035   LYMPHSABS 2.0 08/19/2016 1035   MONOABS 0.5 08/19/2016 1035   EOSABS 0.1 08/19/2016 1035   BASOSABS 0.0 08/19/2016 1035    Hgb A1C Lab Results  Component Value Date   HGBA1C 5.5 08/19/2016           Assessment & Plan:   Allergic Rhinitis:  80 mg Depo IM today Start Claritin and Flonase OTC  Return precautions discussed Nicki ReaperBAITY, Kahleel Fadeley, NP

## 2017-02-02 NOTE — Patient Instructions (Signed)

## 2017-02-02 NOTE — Addendum Note (Signed)
Addended by: Roena MaladyEVONTENNO, Sally Reimers Y on: 02/02/2017 10:27 AM   Modules accepted: Orders

## 2017-02-09 ENCOUNTER — Other Ambulatory Visit (HOSPITAL_COMMUNITY): Payer: Self-pay | Admitting: Interventional Radiology

## 2017-02-09 DIAGNOSIS — I771 Stricture of artery: Secondary | ICD-10-CM

## 2017-02-16 ENCOUNTER — Ambulatory Visit (HOSPITAL_COMMUNITY)
Admission: RE | Admit: 2017-02-16 | Discharge: 2017-02-16 | Disposition: A | Discharge status: Home / Self Care | Payer: Medicare HMO | Source: Ambulatory Visit | Attending: Interventional Radiology | Admitting: Interventional Radiology

## 2017-02-16 ENCOUNTER — Other Ambulatory Visit (HOSPITAL_COMMUNITY): Payer: Self-pay | Admitting: Interventional Radiology

## 2017-02-16 DIAGNOSIS — I771 Stricture of artery: Secondary | ICD-10-CM

## 2017-02-16 DIAGNOSIS — I700 Atherosclerosis of aorta: Secondary | ICD-10-CM | POA: Insufficient documentation

## 2017-02-16 DIAGNOSIS — I6522 Occlusion and stenosis of left carotid artery: Secondary | ICD-10-CM | POA: Diagnosis not present

## 2017-02-16 LAB — POCT I-STAT CREATININE: CREATININE: 0.9 mg/dL (ref 0.44–1.00)

## 2017-02-16 MED ORDER — IOPAMIDOL (ISOVUE-370) INJECTION 76%
50.0000 mL | Freq: Once | INTRAVENOUS | Status: AC | PRN
  Administered 2017-02-16: 50 mL via INTRAVENOUS

## 2017-02-17 ENCOUNTER — Ambulatory Visit (INDEPENDENT_AMBULATORY_CARE_PROVIDER_SITE_OTHER): Payer: Medicare HMO | Admitting: Family Medicine

## 2017-02-17 ENCOUNTER — Encounter: Payer: Self-pay | Admitting: Family Medicine

## 2017-02-17 DIAGNOSIS — H6980 Other specified disorders of Eustachian tube, unspecified ear: Secondary | ICD-10-CM

## 2017-02-17 DIAGNOSIS — H6990 Unspecified Eustachian tube disorder, unspecified ear: Secondary | ICD-10-CM | POA: Insufficient documentation

## 2017-02-17 NOTE — Patient Instructions (Addendum)
Your ear looks better.  I'll await the other clinic's input about your CT scan.  Take care.  Glad to see you.  Keep taking the claritin, not claritin-D.

## 2017-02-17 NOTE — Progress Notes (Signed)
We talked about her recent CT but I'll defer to the ordering MD.  She understood.  My understanding is that she doesn't have a new change noted on the most recent imaging.    F/u re: ear pain.  Still with mild L ear fullness/"getting stopped up".  Is clearly better now.  No FCNAVD.  She doesn't fell sick.  Claritin helps.  She can't tolerate flonase due to nasal irritation.   Meds, vitals, and allergies reviewed.   ROS: Per HPI unless specifically indicated in ROS section   nad ncat Tympanic membranes without erythema bilaterally.  She does have a small amount of clear fluid behind the left TM.  Bilateral canals are normal.  Nasal exam unremarkable.  Oropharynx within normal limits.  Neck supple.  No lymphadenopathy.

## 2017-02-17 NOTE — Assessment & Plan Note (Signed)
Resolving.  Clearly improved.  Continue Claritin.  Update me as needed.  She agrees.  Anatomy discussed with patient.

## 2017-02-24 ENCOUNTER — Telehealth (HOSPITAL_COMMUNITY): Payer: Self-pay | Admitting: Radiology

## 2017-02-24 NOTE — Telephone Encounter (Signed)
Returned pt's call. She is requesting the results of her recent follow-up imaging for stenosis. I informed her that Dr. Corliss Skainseveshwar has not yet gone through her paperwork but that once he does she would receive a phone call with that result. She was in agreement with this plan. JM

## 2017-03-01 ENCOUNTER — Ambulatory Visit (INDEPENDENT_AMBULATORY_CARE_PROVIDER_SITE_OTHER): Payer: Medicare HMO | Admitting: Family Medicine

## 2017-03-01 ENCOUNTER — Encounter: Payer: Self-pay | Admitting: Family Medicine

## 2017-03-01 VITALS — BP 144/80 | HR 79 | Temp 99.4°F | Wt 181.5 lb

## 2017-03-01 DIAGNOSIS — R05 Cough: Secondary | ICD-10-CM | POA: Diagnosis not present

## 2017-03-01 DIAGNOSIS — R059 Cough, unspecified: Secondary | ICD-10-CM

## 2017-03-01 LAB — POC INFLUENZA A&B (BINAX/QUICKVUE)
INFLUENZA A, POC: NEGATIVE
INFLUENZA B, POC: NEGATIVE

## 2017-03-01 MED ORDER — BENZONATATE 200 MG PO CAPS: 200.0000 mg | ORAL_CAPSULE | Freq: Three times a day (TID) | ORAL | 0 refills | Status: DC | PRN

## 2017-03-01 NOTE — Patient Instructions (Signed)
Likely a non-flu virus, a cold.  Try to get some rest and drink plenty of fluids.  Use tessalon for the cough.  Take care.  Glad to see you.

## 2017-03-01 NOTE — Progress Notes (Signed)
She was getting better at the last OV but then got worse again.  Sx got worse again on 02/26/17.  Taking claritin, no claritin D.  Temp up to 101 recently.  She has taking tylenol earlier today.  She doesn't have diffuse aches.  Cough and voice is hoarse.    She is worried about getting her husband sick.    Meds, vitals, and allergies reviewed.   ROS: Per HPI unless specifically indicated in ROS section   GEN: nad, alert and oriented HEENT: mucous membranes moist, tm w/o erythema, nasal exam w/o erythema, clear discharge noted,  OP with cobblestoning, sinuses not ttp x4 NECK: supple w/o LA CV: rrr.   PULM: ctab, no inc wob EXT: no edema SKIN: well perfused.  Voice is hoarse Speaking in complete sentences.   Flu neg. D/w pt.

## 2017-03-04 DIAGNOSIS — R059 Cough, unspecified: Secondary | ICD-10-CM | POA: Insufficient documentation

## 2017-03-04 DIAGNOSIS — R05 Cough: Secondary | ICD-10-CM | POA: Insufficient documentation

## 2017-03-04 NOTE — Assessment & Plan Note (Signed)
Likely a non-flu virus, a cold.  She'll try to get some rest and drink plenty of fluids.  Use tessalon for the cough.  Update me as needed.   She agrees.

## 2017-03-19 ENCOUNTER — Encounter: Payer: Self-pay | Admitting: Cardiovascular Disease

## 2017-03-24 ENCOUNTER — Telehealth (HOSPITAL_COMMUNITY): Payer: Self-pay

## 2017-03-24 NOTE — Telephone Encounter (Signed)
Pt agreed to f/u in 1 yr with cta head/neck. AW 

## 2017-03-29 ENCOUNTER — Encounter: Payer: Self-pay | Admitting: Cardiovascular Disease

## 2017-03-29 ENCOUNTER — Ambulatory Visit: Payer: Medicare HMO | Admitting: Cardiovascular Disease

## 2017-03-29 VITALS — BP 168/70 | HR 67 | Ht 59.5 in | Wt 185.0 lb

## 2017-03-29 DIAGNOSIS — I5032 Chronic diastolic (congestive) heart failure: Secondary | ICD-10-CM | POA: Diagnosis not present

## 2017-03-29 DIAGNOSIS — I340 Nonrheumatic mitral (valve) insufficiency: Secondary | ICD-10-CM | POA: Diagnosis not present

## 2017-03-29 DIAGNOSIS — I441 Atrioventricular block, second degree: Secondary | ICD-10-CM

## 2017-03-29 DIAGNOSIS — I350 Nonrheumatic aortic (valve) stenosis: Secondary | ICD-10-CM

## 2017-03-29 DIAGNOSIS — I2510 Atherosclerotic heart disease of native coronary artery without angina pectoris: Secondary | ICD-10-CM

## 2017-03-29 DIAGNOSIS — I10 Essential (primary) hypertension: Secondary | ICD-10-CM

## 2017-03-29 MED ORDER — AMLODIPINE BESYLATE 5 MG PO TABS: 5.0000 mg | ORAL_TABLET | Freq: Two times a day (BID) | ORAL | 3 refills | Status: DC

## 2017-03-29 NOTE — Patient Instructions (Signed)

## 2017-03-29 NOTE — Progress Notes (Signed)
Chief Complaint  Patient presents with  . Coronary Artery Disease    History of Present Illness: 77 yo female with h/o CAD, HTN, hyperlipidemia, hypothyroidism, AV block s/p PPM, CVA and basilar artery stenosis, mild mitral regurgitation, mild aortic stenosis here today for follow up. Left heart cath on 06/13/09 and she was found to have a severe stenosis in the RCA. There was minor disease in the LAD and Circumflex. I placed a single drug eluting stent in the RCA. [facility]11 with dizziness, chest pain and repeat cath showed stable disease. She was observed to have second degree AV block demonstrated with RBBB. Her coreg was stopped. She was discharged and wore an event monitor. This did not document arrhythmias or advanced AV block. There were no episodes of AV block documented. GXT was performed in our office 09/01/11. She was observed during the study to have a LBBB with exertion. She developed exertional symptoms of fatigue, SOB, and CP which corresponded to her LBBB. She was seen by Dr. Johney FrameAllred and continued to have episodes of palpitations and dizziness with moderate activity. A permanent pacemaker was placed on 11/11/11 by Dr. Johney FrameAllred. She has been undergoing cerebral angiograms for basilar artery stenosis and has had angiplasty of the basilar artery in August 2010. She is known to have an occluded left common carotid artery. She had a CVA in December 2012. [facility] with chest pain. Cardiac cath 07/27/13 with stable CAD. Cardiopulmonary stress test 09/12/13 with maximum HR 115 bpm with 7 minutes exercise.   She is here today for follow up. The patient denies any chest pain, dyspnea, palpitations, lower extremity edema, orthopnea, PND, dizziness, near syncope or syncope.   Primary Care Physician: Joaquim Namuncan, Graham S, MD  Past Medical History:  Diagnosis Date  . Arthritis    "fingers" (02/27/2013)  . Basilar artery stenosis    90%  . Bladder spasms    . Blood transfusion 1960's   w/childbirth  . CAD (coronary artery disease)    a. 06/2009 cath: severe stenosis RCA with placement drug eluting stent;  b. 08/2011 cath: normal left main, luminal LAD irregularities, luminal LCx irregularities, luminal RCA irregularities and no ISR to mid-RCA DES, LVEF 60-65%; c. 02/2013 nonischemic CL, EF 79%.  . CVA (cerebral infarction) December 2012  . Family history of anesthesia complication    "first cousin had fever and almost died" (02/27/2013)  . Fibromyalgia   . HLD (hyperlipidemia) 01/1997  . Hypertension   . Hypothyroidism 1990  . IC (interstitial cystitis)   . Labile hypertension 08/1991  . Left-sided carotid artery obstruction   . Malignant hyperthermia    "told to warn everybody before I had surgery that my 1st cousin had malignant hyperthermia; I've had symptoms of it too" (02/27/2013)  . Obesity   . Pacemaker 11/11/2011   dual chamber pacemaker  . Pneumonia    "twice" (02/27/2013)  . Second degree Mobitz II AV block   . Stroke [facility](HCC)    "they tell me I've had some mini strokes; nothing that's bothered me though" (02/27/2013)  . Stroke (HCC) 2017  . TIA (transient ischemic attack)    multiple/notes 08/02/2015  . TIA (transient ischemic attack)     Past Surgical History:  Procedure Laterality Date  . basilar artery angioplasty  09/04/08   sm right brachial hematoma  . Bilateral Vert & Subclavian Angiogram  07/19/08   85-90% stenosis mid basilar art; occluded left vert  artery; occluded left comm carotid artery w/ collat flow  . BLADDER SUSPENSION  1974  . CARDIAC CATHETERIZATION  1993   normal  . carotid arteriogram     bilat  . Carotid US  11/93   Occl L comm carotid  . Carotid US  4/98   L comm occl R-ok  . Carotid US  01/30/03   L occl  R less 40%  . CATARACT EXTRACTION W/ INTRAOCULAR LENS IMPLANT Bilateral 2007-2008  . Cath single vess dz  06/13/09   70% stenosis RCA EF 65-70%  . CHOLECYSTECTOMY  1985  . CORONARY ANGIOPLASTY WITH  STENT PLACEMENT  06/19/09  . DEXA  10/15/04   Troch -0.4 o/w pos  . EMG  1/01   LE, RUE normal  . EYE SURGERY    . INSERT / REPLACE / REMOVE PACEMAKER  11/11/2011   MDT Adapta L implanted by Dr Johney Frame. [employer]  . LEFT HEART CATHETERIZATION WITH CORONARY ANGIOGRAM N/A 08/10/2011   Procedure: LEFT HEART CATHETERIZATION WITH CORONARY ANGIOGRAM;  Surgeon: Kathleene Hazel, MD;  Location[facility] CATH LAB;  Service: Cardiovascular;  Laterality: N/A;  . LEFT HEART CATHETERIZATION WITH CORONARY ANGIOGRAM N/A 07/28/2013   Procedure: LEFT HEART CATHETERIZATION WITH CORONARY ANGIOGRAM;  Surgeon: Kathleene Hazel, MD;  Location: [facility] CATH LAB;  Service: Cardiovascular;  Laterality: N/A;  . PERMANENT PACEMAKER INSERTION N/A 11/11/2011   Procedure: PERMANENT PACEMAKER INSERTION;  Surgeon: Hillis Range, MD;  [facility];  Service: Cardiovascular;  Laterality: N/A;  . RENAL ANGIOPLASTY  1993   normal  . US RENAL/AORTA  12/93   Normal  . VAGINAL HYSTERECTOMY  1974   dysmennorhea  . Visual evoked response  11/93; 1/01   normal    Current Outpatient Medications  Medication Sig Dispense Refill  . acetaminophen (TYLENOL) 500 MG tablet Take 1,000 mg by mouth every 6 (six) hours as needed for moderate pain.     Marland Kitchen amLODipine (NORVASC) 5 MG tablet Take 1 tablet (5 mg total) by mouth 2 (two) times daily. 180 tablet 3  . aspirin EC 81 MG tablet Take 1 tablet (81 mg total) by mouth daily. 90 tablet 3  . benzonatate (TESSALON) 200 MG capsule Take 1 capsule (200 mg total) by mouth 3 (three) times daily as needed. 30 capsule 0  . Cholecalciferol (VITAMIN D) 2000 units CAPS Take 1 capsule (2,000 Units total) by mouth daily.    . clopidogrel (PLAVIX) 75 MG tablet TAKE 1 TABLET (75 MG TOTAL) BY MOUTH DAILY. 90 tablet 2  . lansoprazole (PREVACID) 15 MG capsule Take 15 mg by mouth daily as needed (reflux).     Marland Kitchen levothyroxine (SYNTHROID, LEVOTHROID) 100 MCG tablet TAKE 1 TABLET (100 MCG TOTAL) BY MOUTH  DAILY. 90 tablet 1  . loratadine (CLARITIN) 10 MG tablet Take 10 mg by mouth daily.    Marland Kitchen losartan-hydrochlorothiazide (HYZAAR) 100-25 MG tablet TAKE 1 TABLET BY MOUTH DAILY. 90 tablet 1  . nitroGLYCERIN (NITROSTAT) 0.4 MG SL tablet Place 0.4 mg under the tongue every 5 (five) minutes as needed for chest pain (MAX 3 TABLETS).     . Red Yeast Rice 600 MG CAPS Take 600 mg by mouth See admin instructions. Takes 2 times daily, except mon and fri-takes 1 time daily only     No current facility-administered medications for this visit.     Allergies  Allergen Reactions  . Anesthetics, Amide Other (See Comments)    REACTION:FEVER, SYNCOPE  . Codeine Nausea And Vomiting  and Other (See Comments)    Hallucinations  . Nitrofurantoin Nausea Only and Other (See Comments)    Hallucinations  . Orphenadrine Citrate Nausea And Vomiting and Other (See Comments)     Hallucination  . Pravastatin Other (See Comments)    Myalgias  . Simvastatin Other (See Comments)    myalgias  . Pentosan Polysulfate Sodium Rash and Other (See Comments)     rash/ severe headache    Social History   Socioeconomic History  . Marital status: Married    Spouse name: Not on file  . Number of children: 1  . Years of education: Not on file  . Highest education level: Not on file  Occupational History  . Occupation: Bookkeeper-part time  . Occupation: retired    Associate Professor: RETIRED  Social Needs  . Financial resource strain: Not on file  . Food insecurity:    Worry: Not on file    Inability: Not on file  . Transportation needs:    Medical: Not on file    Non-medical: Not on file  Tobacco Use  . Smoking status: Never Smoker  . Smokeless tobacco: Never Used  Substance and Sexual Activity  . Alcohol use: No    Alcohol/week: 0.0 oz  . Drug use: No  . Sexual activity: Not Currently    Birth control/protection: Post-menopausal  Lifestyle  . Physical activity:    Days per week: Not on file    Minutes per session:  Not on file  . Stress: Not on file  Relationships  . Social connections:    Talks on phone: Not on file    Gets together: Not on file    Attends religious service: Not on file    Active member of club or organization: Not on file    Attends meetings of clubs or organizations: Not on file    Relationship status: Not on file  . Intimate partner violence:    Fear of current or ex partner: Not on file    Emotionally abused: Not on file    Physically abused: Not on file    Forced sexual activity: Not on file  Other Topics Concern  . 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 Lung cancer Father   . Heart attack Mother   . Lymphoma Mother        s/p chemo  . Stroke Mother        x 2  . Diabetes Brother   . Breast cancer Maternal Aunt   . Colon cancer Neg Hx     Review of Systems:  As stated in the HPI and otherwise negative.   BP (!) 168/70   Pulse 67   Ht 4' 11.5" (1.511 m)   Wt 185 lb (83.9 kg)   SpO2 97%   BMI 36.74 kg/m   Physical Examination:  General: Well developed, well nourished, NAD  HEENT: OP clear, mucus membranes moist  SKIN: warm, dry. No rashes. Neuro: No focal deficits  Musculoskeletal: Muscle strength 5/5 all ext  Psychiatric: Mood and affect normal  Neck: No JVD, no carotid bruits, no thyromegaly, no lymphadenopathy.  Lungs:Clear bilaterally, no wheezes, rhonci, crackles Cardiovascular: Regular rate and rhythm. No murmurs, gallops or rubs. Abdomen:Soft. Bowel sounds present. Non-tender.  Extremities: No  lower extremity edema. Pulses are 2 + in the bilateral DP/PT.  Cardiac cath 07/27/13: Left main: No obstructive disease.  Left Anterior Descending Artery: Large caliber vessel that courses to the apex. The mid vessel has diffuse 20% stenosis. There is a very  small caliber diagonal branch with proximal 40% stenosis. There are no flow limiting lesions noted.  Circumflex Artery: Moderate caliber vessel with moderate caliber obtuse marginal branch. No obstructive disease.  Right Coronary Artery: Large caliber vessel with 20% mid stenosis, patent distal stent without restenosis.  Left Ventricular Angiogram: LVEF=65-70%.  Impression:  1. Single vessel CAD with patent stent RCA  2. Normal LV function  3. Non-cardiac chest pain   Echo July 2017: - Left ventricle: The cavity size was normal. Wall thickness was   normal. Systolic function was normal. The estimated ejection   fraction was in the range of 60% to 65%. Wall motion was normal;   there were no regional wall motion abnormalities. Features are   consistent with a pseudonormal left ventricular filling pattern,   with concomitant abnormal relaxation and increased filling   pressure (grade 2 diastolic dysfunction). - Ventricular septum: Septal motion showed paradox. These changes   are consistent with right ventricular pacing. - Aortic valve: There was mild stenosis. Valve area (VTI): 2.05   cm^2. Valve area (Vmax): 1.87 cm^2. Valve area (Vmean): 1.78   cm^2. - Mitral valve: There was mild to moderate regurgitation directed   centrally. - Left atrium: The atrium was mildly dilated.  ------------------------------------------------------------------- Labs, prior tests, procedures, and surgery: Permanent pacemaker system implantation.  EKG:  EKG is ordered today. The ekg ordered today demonstrates paced rhythm.   Recent Labs: 08/19/2016: ALT 15; BUN 17; Hemoglobin 15.0; Platelets 255.0; Potassium 3.8; Sodium 141; TSH 0.99 02/16/2017: Creatinine, Ser 0.90   Lipid Panel    Component Value Date/Time   CHOL 165 08/19/2016 1035   TRIG 152.0 (H) 08/19/2016 1035   HDL 41.10 08/19/2016 1035   CHOLHDL 4 08/19/2016 1035   VLDL 30.4 08/19/2016 1035   LDLCALC 93 08/19/2016 1035   LDLDIRECT  145.0 10/01/2014 1336     Wt Readings from Last 3 Encounters:  03/29/17 185 lb (83.9 kg)  03/01/17 181 lb 8 oz (82.3 kg)  02/17/17 183 lb (83 kg)     Other studies Reviewed: Additional studies/ records that were reviewed today include: . Review of the above records demonstrates:    Assessment and Plan:   1. CAD without angina: No chest pain. Last cath in July 2015 with stable CAD. Will continue DAPT with ASA and statin given previous coronary stent and prior CVA.    2. High grade symptomatic AV block: s/p PPM 11/10/11 per Dr. Johney Frame. She is followed in the pacer clinic.   3. HTN: BP is controlled at home but up today. Her family member was being seen by my partner today and passed out in the office. She is upset. No changes  4. Chronic diastolic CHF: Weight is stable. Lasix prn.  5. Aortic valve stenosis: Mild by echo in July 2017. Repeat echo in July 2020. Marland Kitchen   6. Mitral valve regurgitation: Mild by echo in July 2017. Will repeat echo in July 2020  Current medicines are reviewed at length with the patient today.  The patient does not have concerns regarding medicines.  The following changes have been made:  no change  Labs/ tests ordered today include:   Orders Placed This Encounter  Procedures  . EKG 12-Lead  Disposition:   FU with me in 12  months  Signed, Verne Carrow, MD 03/29/2017 1:24 PM    [facility] [address] [place], [place], [place]  [address] Phone: [phone]; Fax: [phone]

## 2017-04-09 ENCOUNTER — Other Ambulatory Visit: Payer: Self-pay | Admitting: Family Medicine

## 2017-04-21 ENCOUNTER — Ambulatory Visit (INDEPENDENT_AMBULATORY_CARE_PROVIDER_SITE_OTHER): Payer: Medicare HMO | Admitting: *Deleted

## 2017-04-21 DIAGNOSIS — I441 Atrioventricular block, second degree: Secondary | ICD-10-CM | POA: Diagnosis not present

## 2017-04-21 NOTE — Progress Notes (Signed)
Remote pacemaker transmission.   

## 2017-04-22 ENCOUNTER — Encounter: Payer: Self-pay | Admitting: Cardiology

## 2017-04-22 LAB — CUP PACEART REMOTE DEVICE CHECK
Battery Impedance: 352 Ohm
Brady Statistic AP VP Percent: 35 %
Brady Statistic AP VS Percent: 0 %
Brady Statistic AS VS Percent: 0 %
Implantable Lead Implant Date: 20131106
Implantable Lead Location: 753860
Implantable Lead Model: 5076
Lead Channel Impedance Value: 418 Ohm
Lead Channel Impedance Value: 545 Ohm
Lead Channel Pacing Threshold Amplitude: 1.125 V
Lead Channel Pacing Threshold Pulse Width: 0.4 ms
Lead Channel Pacing Threshold Pulse Width: 0.4 ms
Lead Channel Sensing Intrinsic Amplitude: 2.8 mV
Lead Channel Setting Sensing Sensitivity: 2 mV
MDC IDC LEAD IMPLANT DT: 20131106
MDC IDC LEAD LOCATION: 753859
MDC IDC MSMT BATTERY REMAINING LONGEVITY: 94 mo
MDC IDC MSMT BATTERY VOLTAGE: 2.79 V
MDC IDC MSMT LEADCHNL RA PACING THRESHOLD AMPLITUDE: 0.5 V
MDC IDC PG IMPLANT DT: 20131106
MDC IDC SESS DTM: 20190417122450
MDC IDC SET LEADCHNL RA PACING AMPLITUDE: 2 V
MDC IDC SET LEADCHNL RV PACING AMPLITUDE: 2.5 V
MDC IDC SET LEADCHNL RV PACING PULSEWIDTH: 0.4 ms
MDC IDC STAT BRADY AS VP PERCENT: 65 %

## 2017-05-25 ENCOUNTER — Ambulatory Visit: Payer: Self-pay | Admitting: *Deleted

## 2017-05-25 ENCOUNTER — Encounter: Payer: Self-pay | Admitting: Internal Medicine

## 2017-05-25 ENCOUNTER — Ambulatory Visit (INDEPENDENT_AMBULATORY_CARE_PROVIDER_SITE_OTHER): Payer: Medicare HMO | Admitting: Internal Medicine

## 2017-05-25 VITALS — BP 144/90 | HR 64 | Temp 98.0°F | Wt 184.0 lb

## 2017-05-25 DIAGNOSIS — B029 Zoster without complications: Secondary | ICD-10-CM | POA: Diagnosis not present

## 2017-05-25 MED ORDER — VALACYCLOVIR HCL 1 G PO TABS: 1000.0000 mg | ORAL_TABLET | Freq: Three times a day (TID) | ORAL | 0 refills | Status: DC

## 2017-05-25 NOTE — Progress Notes (Signed)
Subjective:    Patient ID: PERCY COMP, female    DOB: Mar 14, 1940, 77 y.o.   MRN: [id number]  HPI  Pt presents to the clinic today with c/o a rash to the back of her neck. She reports she noticed this on Sunday. She reports the rash is sore to the touch. She has not noticed any drainage from the area. She is concerned that she may have been bit by a spider. She has tried Tylenol and Hydrocortisone cream with some relief. She denies changes in lotions, soaps or detergents. No one in her home has a similar rash.  Review of Systems      Past Medical History:  Diagnosis Date  . Arthritis    "fingers" (03-Mar-2013)  . Basilar artery stenosis    90%  . Bladder spasms   . Blood transfusion 1960's   w/childbirth  . CAD (coronary artery disease)    a. 06/2009 cath: severe stenosis RCA with placement drug eluting stent;  b. 08/2011 cath: normal left main, luminal LAD irregularities, luminal LCx irregularities, luminal RCA irregularities and no ISR to mid-RCA DES, LVEF 60-65%; c. 02/2013 nonischemic CL, EF 79%.  . CVA (cerebral infarction) December 2012  . Family history of anesthesia complication    "first cousin had fever and almost died" (03-03-2013)  . Fibromyalgia   . HLD (hyperlipidemia) 01/1997  . Hypertension   . Hypothyroidism 1990  . IC (interstitial cystitis)   . Labile hypertension 08/1991  . Left-sided carotid artery obstruction   . Malignant hyperthermia    "told to warn everybody before I had surgery that my 1st cousin had malignant hyperthermia; I've had symptoms of it too" (03-Mar-2013)  . Obesity   . Pacemaker 11/11/2011   dual chamber pacemaker  . Pneumonia    "twice" (03-03-2013)  . Second degree Mobitz II AV block   . Stroke [facility])    "they tell me I've had some mini strokes; nothing that's bothered me though" (03/03/13)  . Stroke (HCC) 2017  . TIA (transient ischemic attack)    multiple/notes 08/02/2015  . TIA (transient ischemic attack)     Current Outpatient  Medications  Medication Sig Dispense Refill  . acetaminophen (TYLENOL) 500 MG tablet Take 1,000 mg by mouth every 6 (six) hours as needed for moderate pain.     Marland Kitchen amLODipine (NORVASC) 5 MG tablet Take 1 tablet (5 mg total) by mouth 2 (two) times daily. 180 tablet 3  . aspirin EC 81 MG tablet Take 1 tablet (81 mg total) by mouth daily. 90 tablet 3  . Cholecalciferol (VITAMIN D) 2000 units CAPS Take 1 capsule (2,000 Units total) by mouth daily.    . clopidogrel (PLAVIX) 75 MG tablet TAKE 1 TABLET (75 MG TOTAL) BY MOUTH DAILY. 90 tablet 2  . lansoprazole (PREVACID) 15 MG capsule Take 15 mg by mouth daily as needed (reflux).     Marland Kitchen levothyroxine (SYNTHROID, LEVOTHROID) 100 MCG tablet TAKE 1 TABLET (100 MCG TOTAL) BY MOUTH DAILY. 90 tablet 1  . loratadine (CLARITIN) 10 MG tablet Take 10 mg by mouth daily.    Marland Kitchen losartan-hydrochlorothiazide (HYZAAR) 100-25 MG tablet TAKE 1 TABLET BY MOUTH DAILY. 90 tablet 1  . nitroGLYCERIN (NITROSTAT) 0.4 MG SL tablet Place 0.4 mg under the tongue every 5 (five) minutes as needed for chest pain (MAX 3 TABLETS).     . Red Yeast Rice 600 MG CAPS Take 600 mg by mouth See admin instructions. Takes 2 times daily, except mon and  fri-takes 1 time daily only    . valACYclovir (VALTREX) 1000 MG tablet Take 1 tablet (1,000 mg total) by mouth 3 (three) times daily. 21 tablet 0   No current facility-administered medications for this visit.     Allergies  Allergen Reactions  . Anesthetics, Amide Other (See Comments)    REACTION:FEVER, SYNCOPE  . Codeine Nausea And Vomiting and Other (See Comments)    Hallucinations  . Nitrofurantoin Nausea Only and Other (See Comments)    Hallucinations  . Orphenadrine Citrate Nausea And Vomiting and Other (See Comments)     Hallucination  . Pravastatin Other (See Comments)    Myalgias  . Simvastatin Other (See Comments)    myalgias  . Pentosan Polysulfate Sodium Rash and Other (See Comments)     rash/ severe headache    Family  History  Problem Relation Age of Onset  . Lung cancer Father   . Heart attack Mother   . Lymphoma Mother        s/p chemo  . Stroke Mother        x 2  . Diabetes Brother   . Breast cancer Maternal Aunt   . Colon cancer Neg Hx     Social History   Socioeconomic History  . Marital status: Married    Spouse name: Not on file  . Number of children: 1  . Years of education: Not on file  . Highest education level: Not on file  Occupational History  . Occupation: Bookkeeper-part time  . Occupation: retired    Associate Professor: RETIRED  Social Needs  . Financial resource strain: Not on file  . Food insecurity:    Worry: Not on file    Inability: Not on file  . Transportation needs:    Medical: Not on file    Non-medical: Not on file  Tobacco Use  . Smoking status: Never Smoker  . Smokeless tobacco: Never Used  Substance and Sexual Activity  . Alcohol use: No    Alcohol/week: 0.0 oz  . Drug use: No  . Sexual activity: Not Currently    Birth control/protection: Post-menopausal  Lifestyle  . Physical activity:    Days per week: Not on file    Minutes per session: Not on file  . Stress: Not on file  Relationships  . Social connections:    Talks on phone: Not on file    Gets together: Not on file    Attends religious service: Not on file    Active member of club or organization: Not on file    Attends meetings of clubs or organizations: Not on file    Relationship status: Not on file  . Intimate partner violence:    Fear of current or ex partner: Not on file    Emotionally abused: Not on file    Physically abused: Not on file    Forced sexual activity: Not on file  Other Topics Concern  . 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  Skin: Pt reports  rash of neck. Denies ulcercations.    No other specific complaints in a complete review of systems (except as listed in HPI above).  Objective:   Physical Exam  BP (!) 144/90   Pulse 64   Temp  98 F (36.7 C) (Oral)   Wt 184 lb (83.5 kg)   SpO2 97%   BMI 36.54 kg/m  Wt Readings from Last 3 Encounters:  05/25/17 184 lb (83.5 kg)  03/29/17 185 lb (83.9 kg)  03/01/17 181 lb 8 oz (82.3 kg)    General: Appears herstated age, well developed, well nourished in NAD. Skin: Vesicular rash on erythematous base noted in dermatomal pattern starting and midline neck extending around to the right side of her neck.  BMET    Component Value Date/Time   NA 141 08/19/2016 1035   K 3.8 08/19/2016 1035   CL 102 08/19/2016 1035   CO2 33 (H) 08/19/2016 1035   GLUCOSE 103 (H) 08/19/2016 1035   BUN 17 08/19/2016 1035   CREATININE 0.90 02/16/2017 1459   CREATININE 0.82 06/02/2011 1106   CALCIUM 9.8 08/19/2016 1035   GFRNONAA 55 (L) 03/08/2016 1508   GFRAA >60 03/08/2016 1508    Lipid Panel     Component Value Date/Time   CHOL 165 08/19/2016 1035   TRIG 152.0 (H) 08/19/2016 1035   HDL 41.10 08/19/2016 1035   CHOLHDL 4 08/19/2016 1035   VLDL 30.4 08/19/2016 1035   LDLCALC 93 08/19/2016 1035    CBC    Component Value Date/Time   WBC 6.1 08/19/2016 1035   RBC 4.96 08/19/2016 1035   HGB 15.0 08/19/2016 1035   HCT 44.6 08/19/2016 1035   PLT 255.0 08/19/2016 1035   MCV 89.9 08/19/2016 1035   MCH 30.5 03/08/2016 1508   MCHC 33.6 08/19/2016 1035   RDW 13.5 08/19/2016 1035   LYMPHSABS 2.0 08/19/2016 1035   MONOABS 0.5 08/19/2016 1035   EOSABS 0.1 08/19/2016 1035   BASOSABS 0.0 08/19/2016 1035    Hgb A1C Lab Results  Component Value Date   HGBA1C 5.5 08/19/2016            Assessment & Plan:   Herpes Zoster:  eRx for Valtrex 1 gm TID x 7 days She declines RX for Neurontin for pain at this time Advised her to avoid touching the area Advised her to stay away from  pregnant women, infants < 1 year, immunocompromised iindividuals  Return precautions discussed Nicki Reaper, NP

## 2017-05-25 NOTE — Telephone Encounter (Signed)
Sunday patient noticed pain R ear with red spot behind it- patient has had low grade fever. Patient has been sore on R side of her head. Ear area is painful- she has red spots on back of neck.  Reason for Disposition . [1] Rash elsewhere on body AND [2] developed after spider bite  Answer Assessment - Initial Assessment Questions 1. APPEARANCE of RASH: "Describe the rash."      Red spots in different places on neck 2. LOCATION: "Where is the rash located?"      Back of neck 3. NUMBER: "How many spots are there?"      Over 5 4. SIZE: "How big are the spots?" (Inches, centimeters or compare to size of a coin)      3- large 2- smaller 5. ONSET: "When did the rash start?"      Sunday 6. ITCHING: "Does the rash itch?" If so, ask: "How bad is the itch?"  (Scale 1-10; or mild, moderate, severe)     no 7. PAIN: "Does the rash hurt?" If so, ask: "How bad is the pain?"  (Scale 1-10; or mild, moderate, severe)     No- but the area hurts 8. OTHER SYMPTOMS: "Do you have any other symptoms?" (e.g., fever)     Pain on the right side of head, low grade 9. PREGNANCY: "Is there any chance you are pregnant?" "When was your last menstrual period?"     n/a  Protocols used: SPIDER BITE - [place]-A-AH, RASH OR REDNESS - [place]ALIZED-A-AH

## 2017-05-25 NOTE — Telephone Encounter (Signed)
Pt has appt with R Baity NP 05/25/17 at 12 noon.

## 2017-05-25 NOTE — Patient Instructions (Signed)

## 2017-05-26 ENCOUNTER — Encounter: Payer: Self-pay | Admitting: Internal Medicine

## 2017-05-27 ENCOUNTER — Encounter: Payer: Self-pay | Admitting: Family Medicine

## 2017-05-27 ENCOUNTER — Ambulatory Visit (INDEPENDENT_AMBULATORY_CARE_PROVIDER_SITE_OTHER): Payer: Medicare HMO | Admitting: Family Medicine

## 2017-05-27 DIAGNOSIS — B029 Zoster without complications: Secondary | ICD-10-CM | POA: Diagnosis not present

## 2017-05-27 NOTE — Patient Instructions (Addendum)
No one who has had chickenpox can catch this from you.  Limit exposure to very young children.  The spots should crust over.   Continue valtrex.  Update me as needed.  Tylenol as needed for pain.  Okay to use hydrocortisone cream as needed for itching.

## 2017-05-27 NOTE — Assessment & Plan Note (Addendum)
Scalp is normal.  No facial rash.  Does not appear to involve the eye.  Ear canal looks normal.  Continue Valtrex.  Shingles pathophysiology and treatment rationale discussed with patient.  Routine cautions given.  See after visit summary.  Okay for outpatient follow-up. All questions answered.  >15 minutes spent in face to face time with patient, >50% spent in counselling or coordination of care

## 2017-05-27 NOTE — Progress Notes (Signed)
Shingles follow-up.  Recently with dermatomal rash on right side of neck.  Seen in clinic a few days ago, started on Valtrex.  Compliant with meds.  No adverse effect on medication.  No fevers.  No chills.  She is putting up with the level of discomfort, manageable with Tylenol.  Med list and allergy list reviewed with patient.  She wanted to come in to talk about her situation.  We discussed.  Meds, vitals, and allergies reviewed.   ROS: Per HPI unless specifically indicated in ROS section   nad ncat OP wnl Conjunctiva within normal limits.  No facial rash. Tympanic membranes normal bilaterally.  Ear canals normal. Dermatomal vesicular rash on the right side of the neck that is located as high as the skin just inferior to the right pinna.  Does not cross the midline.

## 2017-05-28 ENCOUNTER — Ambulatory Visit: Payer: Medicare HMO | Admitting: Family Medicine

## 2017-06-08 ENCOUNTER — Encounter: Payer: Self-pay | Admitting: Family Medicine

## 2017-06-08 ENCOUNTER — Ambulatory Visit (INDEPENDENT_AMBULATORY_CARE_PROVIDER_SITE_OTHER): Payer: Medicare HMO | Admitting: Family Medicine

## 2017-06-08 DIAGNOSIS — B029 Zoster without complications: Secondary | ICD-10-CM

## 2017-06-08 MED ORDER — GABAPENTIN 100 MG PO CAPS: 100.0000 mg | ORAL_CAPSULE | Freq: Three times a day (TID) | ORAL | 2 refills | Status: DC | PRN

## 2017-06-08 NOTE — Patient Instructions (Addendum)
Gradually increase the dose of gabapentin.  Start with 1 pill at night.   See if that helps.   Take care.  Glad to see you.  Update me as needed.

## 2017-06-08 NOTE — Progress Notes (Signed)
Shingles f/u.  The rash is resolving but she has pain and paresthesia on the R chest wall/face/neck/ear.  No L sided sx.  She doesn't tolerate ibuprofen well.    Meds, vitals, and allergies reviewed.   ROS: Per HPI unless specifically indicated in ROS section   nad ncat Resolving rash on the R side of the neck.   She has local paresthesias in the distribution of the rash as expected. rrr ctab

## 2017-06-09 NOTE — Assessment & Plan Note (Signed)
Discussed with patient about options.  Gabapentin is likely the least offensive medication that may do the most amount of good for the type of pain that she is having.  Discussed with patient about starting 100 mg at night with routine cautions.  She can gradually uptitrate from there as needed and if tolerated.  Update me as needed.  She agrees.

## 2017-06-28 ENCOUNTER — Encounter: Payer: Self-pay | Admitting: Family Medicine

## 2017-06-28 ENCOUNTER — Ambulatory Visit (INDEPENDENT_AMBULATORY_CARE_PROVIDER_SITE_OTHER): Payer: Medicare HMO | Admitting: Family Medicine

## 2017-06-28 VITALS — BP 130/78 | HR 62 | Temp 98.2°F | Ht 59.5 in | Wt 184.8 lb

## 2017-06-28 DIAGNOSIS — B0229 Other postherpetic nervous system involvement: Secondary | ICD-10-CM | POA: Diagnosis not present

## 2017-06-28 MED ORDER — CAPSAICIN 0.025 % EX GEL: CUTANEOUS | 1 refills | Status: DC

## 2017-06-28 NOTE — Progress Notes (Signed)
BP 130/78 (BP Location: Left Arm, Patient Position: Sitting, Cuff Size: Normal)   Pulse 62   Temp 98.2 F (36.8 C) (Oral)   Ht 4' 11.5" (1.511 m)   Wt 184 lb 12 oz (83.8 kg)   SpO2 96%   BMI 36.69 kg/m    CC: herpes zoster Subjective:    Patient ID: Kelsey Dunn, female    DOB: 11/18/1940, 77 y.o.   MRN: [id number]  HPI: Kelsey Dunn is a 77 y.o. female presenting on 06/28/2017 for Herpes Zoster (Dx with shingles 05/24/17. Says breakout is about gone but still having pain/burning. Taking gabapentin but is asking if there is any topical tx she can use. )   See several recent office notes for shingles complicated by post herpetic neuralgia - R chest wall, face, neck, ear. Did not tolerate ibuprofen well. Treated with valtrex 05/25/2017. Rash has improved.   Ongoing scalp pain, R neck and R shoulder burning pain. Ongoing numbness to right neck and ear.  Pain worse at night time.  Started on gabapentin 100mg  nightly 06/08/2017. Currently taking 100mg  TID.  She has also been using cortisone for skin.  Pain continues waking her up at night.   She also had wasp bite to l ankle - treating with cortisone and slowly improving.  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   Relevant past medical, surgical, family and social history reviewed and updated as indicated. Interim medical history since our last visit reviewed. Allergies and medications reviewed and updated. Outpatient Medications Prior to Visit  Medication Sig Dispense Refill  . acetaminophen (TYLENOL) 500 MG tablet Take 1,000 mg by mouth every 6 (six) hours as needed for moderate pain.     Marland Kitchen. aspirin EC 81 MG tablet Take 1 tablet (81 mg total) by mouth daily. 90 tablet 3  . Cholecalciferol (VITAMIN D) 2000 units CAPS Take 1 capsule (2,000 Units total) by mouth daily.    . clopidogrel (PLAVIX) 75 MG tablet TAKE 1 TABLET (75 MG TOTAL) BY MOUTH DAILY. 90 tablet 2  . gabapentin (NEURONTIN) 100 MG capsule Take 1 capsule (100 mg total) by mouth 3  (three) times daily as needed. 30 capsule 2  . lansoprazole (PREVACID) 15 MG capsule Take 15 mg by mouth daily as needed (reflux).     Marland Kitchen. levothyroxine (SYNTHROID, LEVOTHROID) 100 MCG tablet TAKE 1 TABLET (100 MCG TOTAL) BY MOUTH DAILY. 90 tablet 1  . loratadine (CLARITIN) 10 MG tablet Take 10 mg by mouth daily. As needed    . losartan-hydrochlorothiazide (HYZAAR) 100-25 MG tablet TAKE 1 TABLET BY MOUTH DAILY. 90 tablet 1  . nitroGLYCERIN (NITROSTAT) 0.4 MG SL tablet Place 0.4 mg under the tongue every 5 (five) minutes as needed for chest pain (MAX 3 TABLETS).     . Red Yeast Rice 600 MG CAPS Take 600 mg by mouth See admin instructions. Takes 2 times daily, except mon and fri-takes 1 time daily only    . amLODipine (NORVASC) 5 MG tablet Take 1 tablet (5 mg total) by mouth 2 (two) times daily. 180 tablet 3   No facility-administered medications prior to visit.      Per HPI unless specifically indicated in ROS section below Review of Systems     Objective:    BP 130/78 (BP Location: Left Arm, Patient Position: Sitting, Cuff Size: Normal)   Pulse 62   Temp 98.2 F (36.8 C) (Oral)   Ht 4' 11.5" (1.511 m)   Wt 184 lb 12 oz (83.8 kg)  SpO2 96%   BMI 36.69 kg/m   Wt Readings from Last 3 Encounters:  06/28/17 184 lb 12 oz (83.8 kg)  06/08/17 184 lb (83.5 kg)  05/27/17 184 lb (83.5 kg)    Physical Exam  Constitutional: She appears well-developed and well-nourished. No distress.  Skin: Skin is warm and dry. There is erythema.  Resolving rash along right neck, shoulder, scalp with some post-inflammatory hyperpigmentation Some excoriations and bruising from self-scratching at R shoulder and arm   Nursing note and vitals reviewed.     Assessment & Plan:   Problem List Items Addressed This Visit    Post herpetic neuralgia - Primary    Current regimen of gabapentin 100mg  tid and hydrocortisone not effectively managing discomfort - symptoms worse at night time. Will change gabapentin to  100mg  at noon, 200mg  at bedtime, if able may increase night time dose to 300mg . Will Rx capsaicin cream PRN - discussed possible erythema, stinging/burning side effect with application. Discussed possible tramadol Rx - she would like to try above first.           Meds ordered this encounter  Medications  . Capsaicin (CAPSAGEL) 0.025 % GEL    Sig: Apply to affected area three times daily as needed    Dispense:  60 g    Refill:  1   No orders of the defined types were placed in this encounter.   Follow up plan: Return if symptoms worsen or fail to improve.  Eustaquio Boyden, MD

## 2017-06-28 NOTE — Patient Instructions (Addendum)
Take gabapentin 2-3 at night, then may take 1 in the morning and/or afternoon.  Try capsaicin cream as needed to tender areas. Let us know how this helps.   Postherpetic Neuralgia Postherpetic neuralgia (PHN) is nerve pain that occurs after a shingles infection. Shingles is a painful rash that appears on one side of the body, usually on your trunk or face. Shingles is caused by the varicella-zoster virus. This is the same virus that causes chickenpox. In people who have had chickenpox, the virus can resurface years later and cause shingles. You may have PHN if you continue to have pain for 3 months after your shingles rash has gone away. PHN appears in the same area where you had the shingles rash. For most people, PHN goes away within 1 year. Getting a vaccination for shingles can prevent PHN. This vaccine is recommended for people older than 50. It may prevent shingles and may also lower your risk of PHN if you do get shingles. What are the causes? PHN is caused by damage to your nerves from the varicella-zoster virus. This damage makes your nerves overly sensitive. What increases the risk? Aging is the biggest risk factor for developing PHN. Most people who get PHN are older than 60. Other risk factors include:  Having very bad pain before your shingles rash starts.  Having a very bad rash.  Having shingles in the nerve that supplies your face and eye (trigeminal nerve).  What are the signs or symptoms? Pain is the main symptom of PHN. The pain is often very bad and may be described as stabbing, burning, or feeling like an electric shock. The pain may come and go or may be there all the time. Pain may be triggered by light touches on the skin or changes in temperature. You may have itching along with the pain. How is this diagnosed? Your health care provider may diagnose PHN based on your symptoms and your history of shingles. Lab studies and other diagnostic tests are usually not  needed. How is this treated? There is no cure for PHN. Treatment for PHN will focus on pain relief. Over-the-counter pain relievers do not usually relieve PHN pain. You may need to work with a pain specialist. Treatment may include:  Antidepressant medicines to help with pain and improve sleep.  Antiseizure medicines to relieve nerve pain.  Strong pain relievers (opioids).  A numbing patch worn on the skin (lidocaine patch).  Follow these instructions at home: It may take a long time to recover from PHN. Work closely with your health care provider, and have a good support system at home.  Take all medicines as directed by your health care provider.  Wear loose, comfortable clothing.  Cover sensitive areas with a dressing to reduce friction from clothing rubbing on the area.  If cold does not make your pain worse, try applying a cool compress or cooling gel pack to the area.  Talk to your health care provider if you feel depressed or desperate. Living with long-term pain can be depressing.  Contact a health care provider if:  Your medicine is not helping.  You are struggling to manage your pain at home. This information is not intended to replace advice given to you by your health care provider. Make sure you discuss any questions you have with your health care provider. Document Released: 03/14/2002 Document Revised: 05/30/2015 Document Reviewed: 12/13/2012 Elsevier Interactive Patient Education  [employer]2018 [employer]

## 2017-06-28 NOTE — Assessment & Plan Note (Signed)
Current regimen of gabapentin 100mg  tid and hydrocortisone not effectively managing discomfort - symptoms worse at night time. Will change gabapentin to 100mg  at noon, 200mg  at bedtime, if able may increase night time dose to 300mg . Will Rx capsaicin cream PRN - discussed possible erythema, stinging/burning side effect with application. Discussed possible tramadol Rx - she would like to try above first.

## 2017-07-12 ENCOUNTER — Ambulatory Visit (INDEPENDENT_AMBULATORY_CARE_PROVIDER_SITE_OTHER): Payer: Medicare HMO | Admitting: Family Medicine

## 2017-07-12 ENCOUNTER — Encounter: Payer: Self-pay | Admitting: Family Medicine

## 2017-07-12 DIAGNOSIS — B029 Zoster without complications: Secondary | ICD-10-CM

## 2017-07-12 MED ORDER — GABAPENTIN 100 MG PO CAPS: 100.0000 mg | ORAL_CAPSULE | Freq: Three times a day (TID) | ORAL | 3 refills | Status: DC | PRN

## 2017-07-12 NOTE — Progress Notes (Signed)
Her rash is better but the R ear is still really sensitive.  She still has some R neck and head pain, dermatomal distribution.  She has used gabapentin but she was worried about possible sedation with that.  It may have helped some with pain.  She did not have sedation with her previous dosing, she was just aware that it was a possible side effect.  She had removed some wax from the ear canal previously.  Meds, vitals, and allergies reviewed.   ROS: Per HPI unless specifically indicated in ROS section   nad ncat Normal ext ear inspection but still with hypersensitive dermatomal sensation on the R side of ear and neck.  B pinna wnl, canals and TM wnl o/w.

## 2017-07-12 NOTE — Patient Instructions (Signed)
Use the higher dose of gabapentin if needed and update me as needed.  Take care.  Glad to see you.

## 2017-07-13 NOTE — Assessment & Plan Note (Signed)
Still with significant pain.  Normal ear inspection now otherwise.  She had previously removed some wax from the ear canal but this was likely incidental. Advised to try the higher dose of gabapentin if needed and update me as needed. She agrees.

## 2017-07-21 ENCOUNTER — Ambulatory Visit (INDEPENDENT_AMBULATORY_CARE_PROVIDER_SITE_OTHER): Payer: Medicare HMO | Admitting: *Deleted

## 2017-07-21 DIAGNOSIS — I441 Atrioventricular block, second degree: Secondary | ICD-10-CM | POA: Diagnosis not present

## 2017-07-21 NOTE — Progress Notes (Signed)
Remote pacemaker transmission.   

## 2017-07-22 ENCOUNTER — Encounter: Payer: Self-pay | Admitting: Cardiology

## 2017-07-23 ENCOUNTER — Other Ambulatory Visit: Payer: Self-pay | Admitting: Family Medicine

## 2017-07-29 LAB — CUP PACEART REMOTE DEVICE CHECK
Battery Voltage: 2.78 V
Brady Statistic AP VS Percent: 0 %
Brady Statistic AS VP Percent: 65 %
Brady Statistic AS VS Percent: 0 %
Date Time Interrogation Session: 20190717123735
Implantable Lead Implant Date: 20131106
Implantable Lead Location: 753860
Implantable Lead Model: 5092
Implantable Pulse Generator Implant Date: 20131106
Lead Channel Impedance Value: 580 Ohm
Lead Channel Pacing Threshold Amplitude: 0.5 V
Lead Channel Pacing Threshold Pulse Width: 0.4 ms
Lead Channel Pacing Threshold Pulse Width: 0.4 ms
Lead Channel Setting Pacing Pulse Width: 0.4 ms
MDC IDC LEAD IMPLANT DT: 20131106
MDC IDC LEAD LOCATION: 753859
MDC IDC MSMT BATTERY IMPEDANCE: 376 Ohm
MDC IDC MSMT BATTERY REMAINING LONGEVITY: 93 mo
MDC IDC MSMT LEADCHNL RA IMPEDANCE VALUE: 423 Ohm
MDC IDC MSMT LEADCHNL RV PACING THRESHOLD AMPLITUDE: 1.125 V
MDC IDC SET LEADCHNL RA PACING AMPLITUDE: 2 V
MDC IDC SET LEADCHNL RV PACING AMPLITUDE: 2.5 V
MDC IDC SET LEADCHNL RV SENSING SENSITIVITY: 2 mV
MDC IDC STAT BRADY AP VP PERCENT: 35 %

## 2017-08-02 ENCOUNTER — Encounter: Payer: Self-pay | Admitting: Family Medicine

## 2017-08-02 ENCOUNTER — Ambulatory Visit (INDEPENDENT_AMBULATORY_CARE_PROVIDER_SITE_OTHER): Payer: Medicare HMO | Admitting: Family Medicine

## 2017-08-02 VITALS — BP 132/82 | HR 71 | Temp 98.0°F | Ht 59.5 in | Wt 182.8 lb

## 2017-08-02 DIAGNOSIS — B0229 Other postherpetic nervous system involvement: Secondary | ICD-10-CM

## 2017-08-02 DIAGNOSIS — R300 Dysuria: Secondary | ICD-10-CM | POA: Diagnosis not present

## 2017-08-02 DIAGNOSIS — B001 Herpesviral vesicular dermatitis: Secondary | ICD-10-CM

## 2017-08-02 LAB — POC URINALSYSI DIPSTICK (AUTOMATED)
BILIRUBIN UA: NEGATIVE
Blood, UA: NEGATIVE
Glucose, UA: NEGATIVE
KETONES UA: NEGATIVE
Nitrite, UA: NEGATIVE
PH UA: 6 (ref 5.0–8.0)
PROTEIN UA: NEGATIVE
SPEC GRAV UA: 1.02 (ref 1.010–1.025)
Urobilinogen, UA: 0.2 E.U./dL

## 2017-08-02 MED ORDER — CEPHALEXIN 500 MG PO CAPS: 500.0000 mg | ORAL_CAPSULE | Freq: Two times a day (BID) | ORAL | 0 refills | Status: DC

## 2017-08-02 MED ORDER — VALACYCLOVIR HCL 1 G PO TABS: 2000.0000 mg | ORAL_TABLET | Freq: Two times a day (BID) | ORAL | 0 refills | Status: DC

## 2017-08-02 NOTE — Assessment & Plan Note (Signed)
Pt endorses h/o IC - but UA with 3+ LE -UCx sent. Will go ahead and start keflex course to treat possible UTI. Will follow culture results. Pt agrees with plan.

## 2017-08-02 NOTE — Patient Instructions (Addendum)
Urine culture sent - we will be in touch with results.  I do think you had fever blister - next time you have this, treat with valacyclovir (prescription provided )  Cold Sore A cold sore, also called a fever blister, is a skin infection that causes small, fluid-filled sores to form inside of the mouth or on the lips, gums, nose, chin, or cheeks. Cold sores can spread to other parts of the body, such as the eyes or fingers. In some people with other medical conditions, cold sores can spread to multiple other body sites, including the genitals. Cold sores can be spread or passed from person to person (contagious) until the sores crust over completely. What are the causes? Cold sores are caused by the herpes simplex virus (HSV-1). HSV-1 is closely related to the virus that causes genital herpes (HSV-2), but these viruses are not the same. Once a person is infected with HSV-1, the virus remains permanently in the body. HSV-1 is spread from person to person through close contact, such as through kissing, touching the affected area, or sharing personal items such as lip balm, razors, or eating utensils. What increases the risk? A cold sore outbreak is more likely to develop in people who:  Are tired, stressed, or sick.  Are menstruating.  Are pregnant.  Take certain medicines.  Are exposed to cold weather or too much sun.  What are the signs or symptoms? Symptoms of a cold sore outbreak often go through different stages. Here is how a cold sore develops:  Tingling, itching, or burning is felt 1-2 days before the outbreak.  Fluid-filled blisters appear on the lips, inside the mouth, on the nose, or on the cheeks.  The blisters start to ooze clear fluid.  The blisters dry up and a yellow crust appears in its place.  The crust falls off.  Other symptoms include:  Fever.  Sore throat.  Headache.  Muscle aches.  Swollen neck glands.  You also may not have any symptoms. How is  this diagnosed? This condition is often diagnosed based on your medical history and a physical exam. Your health care provider may swab your sore and then examine it in the lab. Rarely, blood tests may be done to check for HSV-1. How is this treated? There is no cure for cold sores or HSV-1. There also is no vaccine for HSV-1. Most cold sores go away on their own without treatment within two weeks. Medicines cannot make the infection go away, but medicines can:  Help relieve some of the pain associated with the sores.  Work to stop the virus from multiplying.  Shorten healing time.  Medicines may be in the form of creams, gels, pills, or a shot. Follow these instructions at home: Medicines  Take or apply over-the-counter and prescription medicines only as told by your health care provider.  Use a cotton-tip swab to apply creams or gels to your sores. Sore Care  Do not touch the sores or pick the scabs.  Wash your hands often. Do not touch your eyes without washing your hands first.  Keep the sores clean and dry.  If directed, apply ice to the sores: ? Put ice in a plastic bag. ? Place a towel between your skin and the bag. ? Leave the ice on for 20 minutes, 2-3 times per day. Lifestyle  Do not kiss, have oral sex, or share personal items until your sores heal.  Eat a soft, bland diet. Avoid eating hot, cold,  or salty foods. These can hurt your mouth.  Use a straw if it hurts to drink out of a glass.  Avoid the sun and limit your stress if these things trigger outbreaks. If sun causes cold sores, apply sunscreen on your lips before being out in the sun. Contact a health care provider if:  You have symptoms for more than two weeks.  You have pus coming from the sores.  You have redness that is spreading.  You have pain or irritation in your eye.  You get sores on your genitals.  Your sores do not heal within two weeks.  You have frequent cold sore outbreaks. Get  help right away if:  You have a fever and your symptoms suddenly get worse.  You have a headache and confusion. This information is not intended to replace advice given to you by your health care provider. Make sure you discuss any questions you have with your health care provider. Document Released: 12/20/1999 Document Revised: 08/16/2015 Document Reviewed: 10/12/2014 Elsevier Interactive Patient Education  [employer]2018 [employer]

## 2017-08-02 NOTE — Assessment & Plan Note (Signed)
Story/exam most consistent with resolving herpes labialis. Encouraged supportive care. Rx provided for valtrex 2gm BID x1 day if recurrent fever blister.

## 2017-08-02 NOTE — Assessment & Plan Note (Signed)
Continued slow recovery.  Continue gabapentin. Capsaicin cream was too strong.

## 2017-08-02 NOTE — Progress Notes (Signed)
BP 132/82 (BP Location: Left Arm, Patient Position: Sitting, Cuff Size: Large)   Pulse 71   Temp 98 F (36.7 C) (Oral)   Ht 4' 11.5" (1.511 m)   Wt 182 lb 12 oz (82.9 kg)   SpO2 95%   BMI 36.29 kg/m    CC: several issues Subjective:    Patient ID: Hal Hopeonnie R Rubiano, female    DOB: [birth year], 77 y.o.   MRN: [id number]  HPI: Hal HopeConnie R Kliewer is a 77 y.o. female presenting on 08/02/2017 for Rash (Dx with shingles 05/2017. Still has rash around mouth, improving. ) and Dysuria (C/o painful urination. Started 07/26/17. Tried AZO. )   See recent notes for details.  Recent shingles outbreak to R face, neck, ear with residual post herpetic neuralgia. Capsaicin cream was too strong. Treating with gabapentin.   New rash started >1 wk ago to angle of R lips. Looked similar to prior shingles rash but isolated to just around lips. This rash was not painful or itchy. She has been treating new rash with clotrimazole cream which was somewhat helpful. It looks much better now. H/o fever blisters. She also tried camphophonate cold sores and abreva without much benefit.   H/o interstitial cystitis. ~1wk h/o pain with urination. Azo has helped some. She felt feverish.  No fevers/chills, nausea, flank pain, abd pain,   Relevant past medical, surgical, family and social history reviewed and updated as indicated. Interim medical history since our last visit reviewed. Allergies and medications reviewed and updated. Outpatient Medications Prior to Visit  Medication Sig Dispense Refill  . acetaminophen (TYLENOL) 500 MG tablet Take 1,000 mg by mouth every 6 (six) hours as needed for moderate pain.     Marland Kitchen. aspirin EC 81 MG tablet Take 1 tablet (81 mg total) by mouth daily. 90 tablet 3  . Cholecalciferol (VITAMIN D) 2000 units CAPS Take 1 capsule (2,000 Units total) by mouth daily.    . clopidogrel (PLAVIX) 75 MG tablet TAKE 1 TABLET (75 MG TOTAL) BY MOUTH DAILY. 90 tablet 2  . gabapentin (NEURONTIN) 100 MG capsule  Take 1-3 capsules (100-300 mg total) by mouth 3 (three) times daily as needed. 100 capsule 3  . lansoprazole (PREVACID) 15 MG capsule Take 15 mg by mouth daily as needed (reflux).     Marland Kitchen. levothyroxine (SYNTHROID, LEVOTHROID) 100 MCG tablet TAKE 1 TABLET (100 MCG TOTAL) BY MOUTH DAILY. 90 tablet 1  . loratadine (CLARITIN) 10 MG tablet Take 10 mg by mouth daily. As needed    . losartan-hydrochlorothiazide (HYZAAR) 100-25 MG tablet TAKE 1 TABLET BY MOUTH DAILY. 90 tablet 1  . nitroGLYCERIN (NITROSTAT) 0.4 MG SL tablet Place 0.4 mg under the tongue every 5 (five) minutes as needed for chest pain (MAX 3 TABLETS).     . Red Yeast Rice 600 MG CAPS Take 600 mg by mouth See admin instructions. Takes 2 times daily, except mon and fri-takes 1 time daily only    . amLODipine (NORVASC) 5 MG tablet Take 1 tablet (5 mg total) by mouth 2 (two) times daily. 180 tablet 3   No facility-administered medications prior to visit.      Per HPI unless specifically indicated in ROS section below Review of Systems     Objective:    BP 132/82 (BP Location: Left Arm, Patient Position: Sitting, Cuff Size: Large)   Pulse 71   Temp 98 F (36.7 C) (Oral)   Ht 4' 11.5" (1.511 m)   Wt 182 lb 12 oz (  82.9 kg)   SpO2 95%   BMI 36.29 kg/m   Wt Readings from Last 3 Encounters:  08/02/17 182 lb 12 oz (82.9 kg)  07/12/17 184 lb 12 oz (83.8 kg)  06/28/17 184 lb 12 oz (83.8 kg)    Physical Exam  Constitutional: She appears well-developed and well-nourished. No distress.  Abdominal: Soft. Bowel sounds are normal. She exhibits no distension and no mass. There is no tenderness. There is no rebound, no guarding and no CVA tenderness. No hernia.  Skin: Skin is warm. Rash noted.  Dried erythematous scaling blistering rash isolated to R angle of lip (3 blisters) No oral lesions Residual excoriations at site of prior herpes zoster at R neck and R upper back  Nursing note and vitals reviewed.  Results for orders placed or  performed in visit on 08/02/17  POCT Urinalysis Dipstick (Automated)  Result Value Ref Range   Color, UA yellow    Clarity, UA clear    Glucose, UA Negative Negative   Bilirubin, UA negative    Ketones, UA negative    Spec Grav, UA 1.020 1.010 - 1.025   Blood, UA negative    pH, UA 6.0 5.0 - 8.0   Protein, UA Negative Negative   Urobilinogen, UA 0.2 0.2 or 1.0 E.U./dL   Nitrite, UA negative    Leukocytes, UA Large (3+) (A) Negative   Lab Results  Component Value Date   CREATININE 0.90 02/16/2017   BUN 17 08/19/2016   NA 141 08/19/2016   K 3.8 08/19/2016   CL 102 08/19/2016   CO2 33 (H) 08/19/2016       Assessment & Plan:   Problem List Items Addressed This Visit    Post herpetic neuralgia    Continued slow recovery.  Continue gabapentin. Capsaicin cream was too strong.       Herpes labialis - Primary    Story/exam most consistent with resolving herpes labialis. Encouraged supportive care. Rx provided for valtrex 2gm BID x1 day if recurrent fever blister.       Relevant Medications   valACYclovir (VALTREX) 1000 MG tablet   cephALEXin (KEFLEX) 500 MG capsule   Dysuria    Pt endorses h/o IC - but UA with 3+ LE -UCx sent. Will go ahead and start keflex course to treat possible UTI. Will follow culture results. Pt agrees with plan.       Relevant Orders   POCT Urinalysis Dipstick (Automated) (Completed)   Urine Culture       Meds ordered this encounter  Medications  . valACYclovir (VALTREX) 1000 MG tablet    Sig: Take 2 tablets (2,000 mg total) by mouth 2 (two) times daily.    Dispense:  4 tablet    Refill:  0  . cephALEXin (KEFLEX) 500 MG capsule    Sig: Take 1 capsule (500 mg total) by mouth 2 (two) times daily.    Dispense:  14 capsule    Refill:  0   Orders Placed This Encounter  Procedures  . Urine Culture  . POCT Urinalysis Dipstick (Automated)    Follow up plan: Return if symptoms worsen or fail to improve.  Eustaquio Boyden, MD

## 2017-08-03 LAB — URINE CULTURE
MICRO NUMBER:: 90893359
SPECIMEN QUALITY: ADEQUATE

## 2017-08-10 ENCOUNTER — Telehealth: Payer: Self-pay

## 2017-08-10 NOTE — Telephone Encounter (Signed)
I spoke with pt; pt finished Keflex on 08/09/17; pt is still hurting when urinates, pt has sharp pain when urinates; no fever, abdominal pain and no urgency. Pt does have some pain in lower back and some frequency of urine. pt is better than when seen on 08/02/17 but still having discomfort. Pt wonders if needs more abx or a different [facility] Pt request cb when Dr Reece AgarG reviews.

## 2017-08-10 NOTE — Telephone Encounter (Signed)
Copied from CRM [id number]. Topic: General - Other >> Aug 10, 2017 10:19 AM Percival SpanishKennedy, Cheryl W wrote:  Pt said she was told to call back if she was still and she said she is  would like a call back  Pt said cal lher cell   [phone]   >> Aug 10, 2017 11:05 AM Patience MuscaIsley, Rena M, LPN wrote: I skyped Elnita Maxwellheryl and asked pt what Sorry!! if she is still hurting she has a uti and said they told her she  had some bacteria  was still?? Cheryl responed by skype;

## 2017-08-10 NOTE — Telephone Encounter (Signed)
I reviewed culture again - UCx may not have been true infection but colonization. Don't recommend further abx at this time. Does she tolerate NSAIDs? Would suggest, if tolerated, that she take aleve one tablet twice daily with meals for 3 days and update us with effect.  Encourage good hydration status, avoid bladder irritants like spicy foods, caffeine, dark sodas.

## 2017-08-10 NOTE — Telephone Encounter (Signed)
Spoke with pt relaying message per Dr. Reece AgarG.  Says she finished abx yesterday but still has some lower abd cramping when beginning urination.  Says she is better than she was.  Pt states she cannot take NSAIDs.  She can only take Tylenol and has to be careful with that.

## 2017-08-10 NOTE — Telephone Encounter (Signed)
Spoke with Kelsey Dunn informing her to take Tylenol 500 mg BID for next 3 days, per Dr. Reece AgarG then call with update.  Kelsey Dunn verbalizes understanding.

## 2017-08-20 ENCOUNTER — Other Ambulatory Visit: Payer: Self-pay | Admitting: Family Medicine

## 2017-08-20 ENCOUNTER — Ambulatory Visit (INDEPENDENT_AMBULATORY_CARE_PROVIDER_SITE_OTHER): Payer: Medicare HMO

## 2017-08-20 VITALS — BP 128/80 | HR 67 | Temp 97.8°F | Ht 59.5 in | Wt 180.2 lb

## 2017-08-20 DIAGNOSIS — E559 Vitamin D deficiency, unspecified: Secondary | ICD-10-CM | POA: Diagnosis not present

## 2017-08-20 DIAGNOSIS — Z0000 Encounter for general adult medical examination without abnormal findings: Secondary | ICD-10-CM

## 2017-08-20 DIAGNOSIS — R739 Hyperglycemia, unspecified: Secondary | ICD-10-CM

## 2017-08-20 DIAGNOSIS — I10 Essential (primary) hypertension: Secondary | ICD-10-CM

## 2017-08-20 LAB — LIPID PANEL
Cholesterol: 170 mg/dL (ref 0–200)
HDL: 48.8 mg/dL (ref >39.00)
LDL CALC: 86 mg/dL (ref 0–99)
NONHDL: 121.12
Total CHOL/HDL Ratio: 3
Triglycerides: 178 mg/dL — ABNORMAL HIGH (ref 0.0–149.0)
VLDL: 35.6 mg/dL (ref 0.0–40.0)

## 2017-08-20 LAB — COMPREHENSIVE METABOLIC PANEL
ALBUMIN: 4.5 g/dL (ref 3.5–5.2)
ALK PHOS: 58 U/L (ref 39–117)
ALT: 17 U/L (ref 0–35)
AST: 19 U/L (ref 0–37)
BUN: 15 mg/dL (ref 6–23)
CHLORIDE: 103 meq/L (ref 96–112)
CO2: 32 mEq/L (ref 19–32)
Calcium: 10.6 mg/dL — ABNORMAL HIGH (ref 8.4–10.5)
Creatinine, Ser: 0.93 mg/dL (ref 0.40–1.20)
GFR: 62.5 mL/min (ref >60.00)
GLUCOSE: 107 mg/dL — AB (ref 70–99)
POTASSIUM: 4.2 meq/L (ref 3.5–5.1)
SODIUM: 141 meq/L (ref 135–145)
TOTAL PROTEIN: 7.3 g/dL (ref 6.0–8.3)
Total Bilirubin: 0.7 mg/dL (ref 0.2–1.2)

## 2017-08-20 LAB — HEMOGLOBIN A1C: HEMOGLOBIN A1C: 5.4 % (ref 4.6–6.5)

## 2017-08-20 LAB — VITAMIN D 25 HYDROXY (VIT D DEFICIENCY, FRACTURES): VITD: 29.54 ng/mL — ABNORMAL LOW (ref 30.00–100.00)

## 2017-08-20 LAB — TSH: TSH: 1.41 u[IU]/mL (ref 0.35–4.50)

## 2017-08-20 NOTE — Patient Instructions (Signed)
[name] , Thank you for taking time to come for your Medicare Wellness Visit. I appreciate your ongoing commitment to your health goals. Please review the following plan we discussed and let me know if I can assist you in the future.   These are the goals we discussed: Goals    . Increase physical activity     Starting 08/19/2016, I will continue to walk at least 30 min daily.        This is a list of the screening recommended for you and due dates:  Health Maintenance  Topic Date Due  . Tetanus Vaccine  12/04/2017*  . Flu Shot  04/05/2018*  . Mammogram  01/05/2019*  . Colon Cancer Screening  05/27/2025*  . Stool Blood Test  08/25/2017  . DEXA scan (bone density measurement)  Completed  . Pneumonia vaccines  Completed  *Topic was postponed. The date shown is not the original due date.   Preventive Care for Adults  A healthy lifestyle and preventive care can promote health and wellness. Preventive health guidelines for adults include the following key practices.  . A routine yearly physical is a good way to check with your health care provider about your health and preventive screening. It is a chance to share any concerns and updates on your health and to receive a thorough exam.  . Visit your dentist for a routine exam and preventive care every 6 months. Brush your teeth twice a day and floss once a day. Good oral hygiene prevents tooth decay and gum disease.  . The frequency of eye exams is based on your age, health, family medical history, use  of contact lenses, and other factors. Follow your health care provider's recommendations for frequency of eye exams.  . Eat a healthy diet. Foods like vegetables, fruits, whole grains, low-fat dairy products, and lean protein foods contain the nutrients you need without too many calories. Decrease your intake of foods high in solid fats, added sugars, and salt. Eat the right amount of calories for you. Get information about a proper diet  from your health care provider, if necessary.  . Regular physical exercise is one of the most important things you can do for your health. Most adults should get at least 150 minutes of moderate-intensity exercise (any activity that increases your heart rate and causes you to sweat) each week. In addition, most adults need muscle-strengthening exercises on 2 or more days a week.  Silver Sneakers may be a benefit available to you. To determine eligibility, you may visit the website: www.silversneakers.com or contact program at [phone] Mon-Fri between 8AM-8PM.   . Maintain a healthy weight. The body mass index (BMI) is a screening tool to identify possible weight problems. It provides an estimate of body fat based on height and weight. Your health care provider can find your BMI and can help you achieve or maintain a healthy weight.   For adults 20 years and older: ? A BMI below 18.5 is considered underweight. ? A BMI of 18.5 to 24.9 is normal. ? A BMI of 25 to 29.9 is considered overweight. ? A BMI of 30 and above is considered obese.   . Maintain normal blood lipids and cholesterol levels by exercising and minimizing your intake of saturated fat. Eat a balanced diet with plenty of fruit and vegetables. Blood tests for lipids and cholesterol should begin at age 820 and be repeated every 5 years. If your lipid or cholesterol levels are high, you are  over 66, or you are at high risk for heart disease, you may need your cholesterol levels checked more frequently. Ongoing high lipid and cholesterol levels should be treated with medicines if diet and exercise are not working.  . If you smoke, find out from your health care provider how to quit. If you do not use tobacco, please do not start.  . If you choose to drink alcohol, please do not consume more than 2 drinks per day. One drink is considered to be 12 ounces (355 mL) of beer, 5 ounces (148 mL) of wine, or 1.5 ounces (44 mL) of liquor.  .  If you are 22-38 years old, ask your health care provider if you should take aspirin to prevent strokes.  . Use sunscreen. Apply sunscreen liberally and repeatedly throughout the day. You should seek shade when your shadow is shorter than you. Protect yourself by wearing long sleeves, pants, a wide-brimmed hat, and sunglasses year round, whenever you are outdoors.  . Once a month, do a whole body skin exam, using a mirror to look at the skin on your back. Tell your health care provider of new moles, moles that have irregular borders, moles that are larger than a pencil eraser, or moles that have changed in shape or color.

## 2017-08-20 NOTE — Progress Notes (Signed)
Subjective:   Kelsey Dunn is a 77 y.o. female who presents for Medicare Annual (Subsequent) preventive examination.  Review of Systems:  N/A Cardiac Risk Factors include: advanced age (>355men, 81>65 women);obesity (BMI >30kg/m2);hypertension;dyslipidemia     Objective:     Vitals: BP 128/80 (BP Location: Right Arm, Patient Position: Sitting, Cuff Size: Normal)   Pulse 67   Temp 97.8 F (36.6 C) (Oral)   Ht 4' 11.5" (1.511 m) Comment: no shoes  Wt 180 lb 4 oz (81.8 kg)   SpO2 98%   BMI 35.80 kg/m   Body mass index is 35.8 kg/m.  Advanced Directives 08/20/2017 08/19/2016 03/08/2016 08/02/2015 05/28/2015 03/26/2014 03/26/2014  Does Patient Have a Medical Advance Directive? No No No;Yes No No No (No Data)  Type of Advance Directive - - Healthcare Power of Attorney - - - -  Copy of Healthcare Power of Attorney in Chart? - - - - - - -  Would patient like information on creating a medical advance directive? No - Patient declined - - No - patient declined information No - patient declined information No - patient declined information -  Pre-existing out of facility DNR order (yellow form or pink MOST form) - - - - - - -    Tobacco Social History   Tobacco Use  Smoking Status Never Smoker  Smokeless Tobacco Never Used     Counseling given: No   Clinical Intake:  Pre-visit preparation completed: Yes  Pain : No/denies pain Pain Score: 0-No pain     Nutritional Status: BMI > 30  Obese Nutritional Risks: None Diabetes: No  How often do you need to have someone help you when you read instructions, pamphlets, or other written materials from your doctor or pharmacy?: 1 - Never What is the last grade level you completed in school?: 12th grade  Interpreter Needed?: No  Comments: pt lives with spouse Information entered by :: LPinson, LPN  Past Medical History:  Diagnosis Date  . Arthritis    "fingers" (02/27/2013)  . Basilar artery stenosis    90%  . Bladder spasms   .  Blood transfusion 1960's   w/childbirth  . CAD (coronary artery disease)    a. 06/2009 cath: severe stenosis RCA with placement drug eluting stent;  b. 08/2011 cath: normal left main, luminal LAD irregularities, luminal LCx irregularities, luminal RCA irregularities and no ISR to mid-RCA DES, LVEF 60-65%; c. 02/2013 nonischemic CL, EF 79%.  . CVA (cerebral infarction) December 2012  . Family history of anesthesia complication    "first cousin had fever and almost died" (02/27/2013)  . Fibromyalgia   . HLD (hyperlipidemia) 01/1997  . Hypertension   . Hypothyroidism 1990  . IC (interstitial cystitis)   . Labile hypertension 08/1991  . Left-sided carotid artery obstruction   . Malignant hyperthermia    "told to warn everybody before I had surgery that my 1st cousin had malignant hyperthermia; I've had symptoms of it too" (02/27/2013)  . Obesity   . Pacemaker 11/11/2011   dual chamber pacemaker  . Pneumonia    "twice" (02/27/2013)  . Second degree Mobitz II AV block   . Stroke [facility](HCC)    "they tell me I've had some mini strokes; nothing that's bothered me though" (02/27/2013)  . Stroke (HCC) 2017  . TIA (transient ischemic attack)    multiple/notes 08/02/2015  . TIA (transient ischemic attack)    Past Surgical History:  Procedure Laterality Date  . basilar artery angioplasty  09/04/08  sm right brachial hematoma  . Bilateral Vert & Subclavian Angiogram  07/19/08   85-90% stenosis mid basilar art; occluded left vert artery; occluded left comm carotid artery w/ collat flow  . BLADDER SUSPENSION  1974  . CARDIAC CATHETERIZATION  1993   normal  . carotid arteriogram     bilat  . Carotid US  11/93   Occl L comm carotid  . Carotid US  4/98   L comm occl R-ok  . Carotid US  01/30/03   L occl  R less 40%  . CATARACT EXTRACTION W/ INTRAOCULAR LENS IMPLANT Bilateral 2007-2008  . Cath single vess dz  06/13/09   70% stenosis RCA EF 65-70%  . CHOLECYSTECTOMY  1985  . CORONARY ANGIOPLASTY WITH STENT  PLACEMENT  06/19/09  . DEXA  10/15/04   Troch -0.4 o/w pos  . EMG  1/01   LE, RUE normal  . EYE SURGERY    . INSERT / REPLACE / REMOVE PACEMAKER  11/11/2011   MDT Adapta L implanted by Dr Johney Frame. [employer]  . LEFT HEART CATHETERIZATION WITH CORONARY ANGIOGRAM N/A 08/10/2011   Procedure: LEFT HEART CATHETERIZATION WITH CORONARY ANGIOGRAM;  Surgeon: Kathleene Hazel, MD;  Location: [facility] CATH LAB;  Service: Cardiovascular;  Laterality: N/A;  . LEFT HEART CATHETERIZATION WITH CORONARY ANGIOGRAM N/A 07/28/2013   Procedure: LEFT HEART CATHETERIZATION WITH CORONARY ANGIOGRAM;  Surgeon: Kathleene Hazel, MD;  Location: [facility] CATH LAB;  Service: Cardiovascular;  Laterality: N/A;  . PERMANENT PACEMAKER INSERTION N/A 11/11/2011   Procedure: PERMANENT PACEMAKER INSERTION;  Surgeon: Hillis Range, MD;  Location: [facility] CATH LAB;  Service: Cardiovascular;  Laterality: N/A;  . RENAL ANGIOPLASTY  1993   normal  . US RENAL/AORTA  12/93   Normal  . VAGINAL HYSTERECTOMY  1974   dysmennorhea  . Visual evoked response  11/93; 1/01   normal   Family History  Problem Relation Age of Onset  . Lung cancer Father   . Heart attack Mother   . Lymphoma Mother        s/p chemo  . Stroke Mother        x 2  . Diabetes Brother   . Breast cancer Maternal Aunt   . Colon cancer Neg Hx    Social History   Socioeconomic History  . Marital status: Married    Spouse name: Not on file  . Number of children: 1  . Years of education: Not on file  . Highest education level: Not on file  Occupational History  . Occupation: Bookkeeper-part time  . Occupation: retired    Associate Professor: RETIRED  Social Needs  . Financial resource strain: Not on file  . Food insecurity:    Worry: Not on file    Inability: Not on file  . Transportation needs:    Medical: Not on file    Non-medical: Not on file  Tobacco Use  . Smoking status: Never Smoker  . Smokeless tobacco: Never Used  Substance and Sexual Activity  . Alcohol use:  No    Alcohol/week: 0.0 standard drinks  . Drug use: No  . Sexual activity: Not Currently    Birth control/protection: Post-menopausal  Lifestyle  . Physical activity:    Days per week: Not on file    Minutes per session: Not on file  . Stress: Not on file  Relationships  . Social connections:    Talks on phone: Not on file    Gets together: Not on file  Attends religious service: Not on file    Active member of club or organization: Not on file    Attends meetings of clubs or organizations: Not on file    Relationship status: Not on file  Other Topics Concern  . 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 acetaminophen (TYLENOL) 500 MG tablet Take 1,000 mg by mouth every 6 (six) hours as needed for moderate pain.   Marland Kitchen aspirin EC 81 MG tablet Take 1 tablet (81 mg total) by mouth daily.  . Cholecalciferol (VITAMIN D) 2000 units CAPS Take 1 capsule (2,000 Units total) by mouth daily.  . clopidogrel (PLAVIX) 75 MG tablet TAKE 1 TABLET (75 MG TOTAL) BY MOUTH DAILY.  Marland Kitchen gabapentin (NEURONTIN) 100 MG capsule Take 1-3 capsules (100-300 mg total) by mouth 3 (three) times daily as needed.  . lansoprazole (PREVACID) 15 MG capsule Take 15 mg by mouth daily as needed (reflux).   Marland Kitchen levothyroxine (SYNTHROID, LEVOTHROID) 100 MCG tablet TAKE 1 TABLET (100 MCG TOTAL) BY MOUTH DAILY.  Marland Kitchen loratadine (CLARITIN) 10 MG tablet Take 10 mg by mouth daily. As needed  . losartan-hydrochlorothiazide (HYZAAR) 100-25 MG tablet TAKE 1 TABLET BY MOUTH DAILY.  . nitroGLYCERIN (NITROSTAT) 0.4 MG SL tablet Place 0.4 mg under the tongue every 5 (five) minutes as needed for chest pain (MAX 3 TABLETS).   . Red Yeast Rice 600 MG CAPS Take 600 mg by mouth See admin instructions. Takes 2 times daily, except mon and  fri-takes 1 time daily only  . [DISCONTINUED] cephALEXin (KEFLEX) 500 MG capsule Take 1 capsule (500 mg total) by mouth 2 (two) times daily.  Marland Kitchen amLODipine (NORVASC) 5 MG tablet Take 1 tablet (5 mg total) by mouth 2 (two) times daily.  . valACYclovir (VALTREX) 1000 MG tablet Take 2 tablets (2,000 mg total) by mouth 2 (two) times daily. (Patient not taking: Reported on 08/20/2017)   No facility-administered encounter medications on file as of 08/20/2017.     Activities of Daily Living In your present state of health, do you have any difficulty performing the following activities: 08/20/2017  Hearing? N  Vision? N  Difficulty concentrating or making decisions? N  Walking or climbing stairs? N  Dressing or bathing? N  Doing errands, shopping? N  Preparing Food and eating ? N  Using the Toilet? N  In the past six months, have you accidently leaked urine? N  Do you have problems with loss of bowel control? N  Managing your Medications? N  Managing your Finances? N  Housekeeping or managing your Housekeeping? N  Some recent data might be hidden    Patient Care Team: Joaquim Nam, MD as PCP - General (Family Medicine) Kathleene Hazel, MD as PCP - Cardiology (Cardiology) Kathleene Hazel, MD as Consulting Physician (Cardiology) Hillis Range, MD as Consulting Physician (Cardiology) Galen Manila, MD as Referring Physician (Ophthalmology)    Assessment:   This is a routine wellness examination for [place].  Exercise Activities and Dietary recommendations Current Exercise Habits: Home exercise routine, Type of exercise: walking, Time (Minutes): 30, Frequency (Times/Week): 7, Weekly Exercise (Minutes/Week): 210, Intensity: Mild, Exercise limited by: None identified  Goals    . Increase physical activity     Starting 08/19/2016, I will continue  to walk at least 30 min daily.        Fall Risk Fall Risk  08/20/2017 08/19/2016 05/28/2015 02/20/2013  Falls in the past  year? No No No No   Depression Screen PHQ 2/9 Scores 08/20/2017 08/19/2016 05/28/2015 02/20/2013  PHQ - 2 Score 0 1 0 0  PHQ- 9 Score 0 6 - -     Cognitive Function MMSE - Mini Mental State Exam 08/20/2017 08/19/2016 05/28/2015  Orientation to time 5 5 5   Orientation to Place 5 5 5   Registration 3 3 3   Attention/ Calculation 0 0 0  Recall 3 3 3   Language- name 2 objects 0 0 0  Language- repeat 1 1 1   Language- follow 3 step command 3 3 3   Language- read & follow direction 0 0 0  Write a sentence 0 0 0  Copy design 0 0 0  Total score 20 20 20        PLEASE NOTE: A Mini-Cog screen was completed. Maximum score is 20. A value of 0 denotes this part of Folstein MMSE was not completed or the patient failed this part of the Mini-Cog screening.   Mini-Cog Screening Orientation to Time - Max 5 pts Orientation to Place - Max 5 pts Registration - Max 3 pts Recall - Max 3 pts Language Repeat - Max 1 pts Language Follow 3 Step Command - Max 3 pts  Immunization History  Administered Date(s) Administered  . Influenza Split 11/05/2010  . Influenza,inj,Quad PF,6+ Mos 10/13/2012, 10/12/2013, 10/12/2014, 10/10/2015, 10/22/2016  . Pneumococcal Conjugate-13 05/28/2015  . Pneumococcal Polysaccharide-23 09/05/2008  . Td 12/05/1997    Screening Tests Health Maintenance  Topic Date Due  . TETANUS/TDAP  12/04/2017 (Originally 12/06/2007)  . INFLUENZA VACCINE  04/05/2018 (Originally 08/05/2017)  . MAMMOGRAM  01/05/2019 (Originally 04/11/2015)  . COLONOSCOPY  05/27/2025 (Originally 11/02/1992)  . COLON CANCER SCREENING ANNUAL FOBT  08/25/2017  . DEXA SCAN  Completed  . PNA vac Low Risk Adult  Completed       Plan:   I have personally reviewed, addressed, and noted the following in the patient's chart:  A. Medical and social history B. Use of alcohol, tobacco or illicit drugs  C. Current medications and supplements D. Functional ability and status E.  Nutritional status F.  Physical  activity G. Advance directives H. List of other physicians I.  Hospitalizations, surgeries, and ER visits in previous 12 months J.  Vitals K. Screenings to include hearing, vision, cognitive, depression L. Referrals and appointments - none  In addition, I have reviewed and discussed with patient certain preventive protocols, quality metrics, and best practice recommendations. A written personalized care plan for preventive services as well as general preventive health recommendations were provided to patient.  See attached scanned questionnaire for additional information.   Signed,   Randa EvensLesia Myca Perno, MHA, BS, LPN Health Coach

## 2017-08-20 NOTE — Progress Notes (Signed)
PCP notes:   Health maintenance:  Flu vaccine - addressed Colon cancer screening - gave FOBT kit to patient  Abnormal screenings:   None  Patient concerns:   Patient verbalized concerns with spouse's health status.   Nurse concerns:  None  Next PCP appt:   08/26/17 @ 1045  I reviewed health advisor's note, was available for consultation on the day of service listed in this note, and agree with documentation and plan. Elsie Stain, MD.

## 2017-08-26 ENCOUNTER — Encounter: Payer: Self-pay | Admitting: Family Medicine

## 2017-08-26 ENCOUNTER — Ambulatory Visit (INDEPENDENT_AMBULATORY_CARE_PROVIDER_SITE_OTHER): Payer: Medicare HMO | Admitting: Family Medicine

## 2017-08-26 VITALS — BP 122/72 | HR 65 | Temp 97.8°F | Ht 59.5 in | Wt 180.2 lb

## 2017-08-26 DIAGNOSIS — I10 Essential (primary) hypertension: Secondary | ICD-10-CM

## 2017-08-26 DIAGNOSIS — M8580 Other specified disorders of bone density and structure, unspecified site: Secondary | ICD-10-CM

## 2017-08-26 DIAGNOSIS — B029 Zoster without complications: Secondary | ICD-10-CM | POA: Diagnosis not present

## 2017-08-26 DIAGNOSIS — I639 Cerebral infarction, unspecified: Secondary | ICD-10-CM

## 2017-08-26 DIAGNOSIS — E039 Hypothyroidism, unspecified: Secondary | ICD-10-CM

## 2017-08-26 DIAGNOSIS — R12 Heartburn: Secondary | ICD-10-CM | POA: Diagnosis not present

## 2017-08-26 DIAGNOSIS — E7800 Pure hypercholesterolemia, unspecified: Secondary | ICD-10-CM

## 2017-08-26 DIAGNOSIS — Z0000 Encounter for general adult medical examination without abnormal findings: Secondary | ICD-10-CM

## 2017-08-26 NOTE — Progress Notes (Signed)
Shingles rash resolved. Minimal R sided pain but sensation is still altered on the R neck locally.  She is clearly better.  Off gabapentin.   Statin intolerant.  D/w pt about labs, diet and exercise.    Hypertension:    Using medication without problems or lightheadedness: yes Chest pain with exertion:no Edema:no Short of breath:no Minimal Ca++ elevation likely unremarkable, d/w pt.    CAD per cards w/o CP. Labs d/w pt.   H/o TIA with ASA/plavix use currently.  Still on RYR.  No ADE on med.  No bleeding.  No ADE on meds.  No new sx.    Hypothyroidism.  TSH wnl.  No ade on med.  Compliant.  D/w pt.    GERD controlled with rare use of PPI.  No ADE on med.  She is working on diet and that is helping.  Intentional weight loss.   Osteopenia.  Discussed with patient about vitamin D.  Vit D minimally low at 29.5.  D/w pt.  She was off vit D recently.    She dropped off IFOB today, results pending.   Flu vaccine- d/w pt.   Tetanus- d/w pt.   Shingles shot still out of stock. D/w pt.   Mammogram- dw pt, encouraged.  She declined at this point.  She does manual checks.   DXA d/w pt.  She'll consider.  Defer for now per patient request.   Advance directive- husband or son equally designated if patient were incapacitated. D/w pt.    She is caring for her husband at baseline, d/w pt.  He is increasingly hypoxic at home from lung disease and he has had more variation in his sugars.  He has f/u pending in the near future.  She thought he wasn't ready for hospice; we discussed.    Meds, vitals, and allergies reviewed.   PMH and SH reviewed  ROS: Per HPI unless specifically indicated in ROS section   GEN: nad, alert and oriented HEENT: mucous membranes moist NECK: supple w/o LA CV: rrr. PULM: ctab, no inc wob ABD: soft, +bs EXT: no edema SKIN: no acute rash Right side of neck and face with change in sensation noted as expected with previous shingles.

## 2017-08-26 NOTE — Patient Instructions (Addendum)
Priced check a tetanus shot at the pharmacy.   I would get a flu shot each fall.   Restart vitamin D.  OTC.   Update me as needed.  Take care.  Glad to see you.

## 2017-08-27 ENCOUNTER — Other Ambulatory Visit: Payer: Self-pay | Admitting: Family Medicine

## 2017-08-27 ENCOUNTER — Other Ambulatory Visit (INDEPENDENT_AMBULATORY_CARE_PROVIDER_SITE_OTHER): Payer: Medicare HMO

## 2017-08-27 DIAGNOSIS — Z1211 Encounter for screening for malignant neoplasm of colon: Secondary | ICD-10-CM | POA: Diagnosis not present

## 2017-08-27 LAB — FECAL OCCULT BLOOD, IMMUNOCHEMICAL: Fecal Occult Bld: NEGATIVE

## 2017-08-29 NOTE — Assessment & Plan Note (Signed)
No change in meds.  Continue work on diet and exercise.  She agrees. 

## 2017-08-29 NOTE — Assessment & Plan Note (Signed)
Statin intolerant.  Labs discussed with patient.  Continue work on diet and exercise.  She agrees. 

## 2017-08-29 NOTE — Assessment & Plan Note (Signed)
Restart vitamin D.  Update me as needed.  She agrees.

## 2017-08-29 NOTE — Assessment & Plan Note (Signed)
Continue aspirin and Plavix.  Continue red yeast rice.  No new symptoms.  She agrees. >25 minutes spent in face to face time with patient, >50% spent in counselling or coordination of care.

## 2017-08-29 NOTE — Assessment & Plan Note (Signed)
TSH normal.  No adverse effect on med.  Compliant.  Labs discussed with patient.  Continue as is.  She agrees.

## 2017-08-29 NOTE — Assessment & Plan Note (Signed)
She dropped off IFOB today, results pending.   Flu vaccine- d/w pt.   Tetanus- d/w pt.   Shingles shot still out of stock. D/w pt.   Mammogram- dw pt, encouraged.  She declined at this point.  She does manual checks.   DXA d/w pt.  She'll consider.  Defer for now per patient request.   Advance directive- husband or son equally designated if patient were incapacitated. D/w pt.    Discussed with her about her caregiver strain.  See above.

## 2017-08-29 NOTE — Assessment & Plan Note (Signed)
Fortunately symptoms improved.  Update me as needed.  She agrees.

## 2017-08-29 NOTE — Assessment & Plan Note (Signed)
Controlled with PPI.  No adverse effect on med.  Continue work on diet.  Intentional weight loss noted.

## 2017-09-23 ENCOUNTER — Ambulatory Visit (INDEPENDENT_AMBULATORY_CARE_PROVIDER_SITE_OTHER): Payer: Medicare HMO

## 2017-09-23 DIAGNOSIS — Z23 Encounter for immunization: Secondary | ICD-10-CM

## 2017-10-20 ENCOUNTER — Ambulatory Visit (INDEPENDENT_AMBULATORY_CARE_PROVIDER_SITE_OTHER): Payer: Medicare HMO | Admitting: *Deleted

## 2017-10-20 DIAGNOSIS — I441 Atrioventricular block, second degree: Secondary | ICD-10-CM | POA: Diagnosis not present

## 2017-10-20 DIAGNOSIS — I5032 Chronic diastolic (congestive) heart failure: Secondary | ICD-10-CM

## 2017-10-20 NOTE — Progress Notes (Signed)
Remote pacemaker transmission.   

## 2017-10-22 ENCOUNTER — Encounter: Payer: Self-pay | Admitting: Cardiology

## 2017-11-13 LAB — CUP PACEART REMOTE DEVICE CHECK
Battery Remaining Longevity: 89 mo
Battery Voltage: 2.79 V
Brady Statistic AS VP Percent: 65 %
Date Time Interrogation Session: 20191016124100
Implantable Lead Implant Date: 20131106
Implantable Lead Location: 753860
Implantable Lead Model: 5092
Implantable Pulse Generator Implant Date: 20131106
Lead Channel Pacing Threshold Amplitude: 0.5 V
Lead Channel Pacing Threshold Amplitude: 1 V
Lead Channel Pacing Threshold Pulse Width: 0.4 ms
Lead Channel Setting Pacing Amplitude: 2 V
Lead Channel Setting Pacing Amplitude: 2.5 V
Lead Channel Setting Pacing Pulse Width: 0.4 ms
Lead Channel Setting Sensing Sensitivity: 2 mV
MDC IDC LEAD IMPLANT DT: 20131106
MDC IDC LEAD LOCATION: 753859
MDC IDC MSMT BATTERY IMPEDANCE: 425 Ohm
MDC IDC MSMT LEADCHNL RA IMPEDANCE VALUE: 429 Ohm
MDC IDC MSMT LEADCHNL RA PACING THRESHOLD PULSEWIDTH: 0.4 ms
MDC IDC MSMT LEADCHNL RV IMPEDANCE VALUE: 605 Ohm
MDC IDC STAT BRADY AP VP PERCENT: 35 %
MDC IDC STAT BRADY AP VS PERCENT: 0 %
MDC IDC STAT BRADY AS VS PERCENT: 0 %

## 2018-01-08 ENCOUNTER — Other Ambulatory Visit: Payer: Self-pay | Admitting: Family Medicine

## 2018-01-19 ENCOUNTER — Ambulatory Visit (INDEPENDENT_AMBULATORY_CARE_PROVIDER_SITE_OTHER): Payer: Medicare HMO

## 2018-01-19 DIAGNOSIS — I441 Atrioventricular block, second degree: Secondary | ICD-10-CM | POA: Diagnosis not present

## 2018-01-20 NOTE — Progress Notes (Signed)
Remote pacemaker transmission.   

## 2018-01-21 LAB — CUP PACEART REMOTE DEVICE CHECK
Battery Impedance: 451 Ohm
Battery Voltage: 2.78 V
Brady Statistic AP VP Percent: 35 %
Brady Statistic AS VS Percent: 0 %
Implantable Lead Implant Date: 20131106
Implantable Lead Implant Date: 20131106
Implantable Lead Location: 753859
Implantable Lead Model: 5076
Lead Channel Impedance Value: 418 Ohm
Lead Channel Impedance Value: 560 Ohm
Lead Channel Pacing Threshold Pulse Width: 0.4 ms
Lead Channel Setting Pacing Amplitude: 2 V
Lead Channel Setting Pacing Amplitude: 2.5 V
Lead Channel Setting Pacing Pulse Width: 0.4 ms
MDC IDC LEAD LOCATION: 753860
MDC IDC MSMT BATTERY REMAINING LONGEVITY: 86 mo
MDC IDC MSMT LEADCHNL RA PACING THRESHOLD AMPLITUDE: 0.5 V
MDC IDC MSMT LEADCHNL RV PACING THRESHOLD AMPLITUDE: 1 V
MDC IDC MSMT LEADCHNL RV PACING THRESHOLD PULSEWIDTH: 0.4 ms
MDC IDC PG IMPLANT DT: 20131106
MDC IDC SESS DTM: 20200115130754
MDC IDC SET LEADCHNL RV SENSING SENSITIVITY: 2 mV
MDC IDC STAT BRADY AP VS PERCENT: 0 %
MDC IDC STAT BRADY AS VP PERCENT: 65 %

## 2018-01-23 ENCOUNTER — Other Ambulatory Visit: Payer: Self-pay | Admitting: Family Medicine

## 2018-02-01 ENCOUNTER — Other Ambulatory Visit: Payer: Self-pay | Admitting: Family Medicine

## 2018-02-17 ENCOUNTER — Other Ambulatory Visit (HOSPITAL_COMMUNITY): Payer: Self-pay | Admitting: Interventional Radiology

## 2018-02-17 DIAGNOSIS — I771 Stricture of artery: Secondary | ICD-10-CM

## 2018-03-02 ENCOUNTER — Encounter (HOSPITAL_COMMUNITY): Payer: Self-pay

## 2018-03-02 ENCOUNTER — Ambulatory Visit (HOSPITAL_COMMUNITY)
Admission: RE | Admit: 2018-03-02 | Discharge: 2018-03-02 | Disposition: A | Discharge status: Home / Self Care | Payer: Medicare HMO | Source: Ambulatory Visit | Attending: Interventional Radiology | Admitting: Interventional Radiology

## 2018-03-02 ENCOUNTER — Ambulatory Visit (HOSPITAL_COMMUNITY): Payer: Medicare HMO

## 2018-03-02 DIAGNOSIS — I6523 Occlusion and stenosis of bilateral carotid arteries: Secondary | ICD-10-CM | POA: Diagnosis not present

## 2018-03-02 DIAGNOSIS — I771 Stricture of artery: Secondary | ICD-10-CM | POA: Insufficient documentation

## 2018-03-02 DIAGNOSIS — G459 Transient cerebral ischemic attack, unspecified: Secondary | ICD-10-CM | POA: Diagnosis not present

## 2018-03-02 LAB — POCT I-STAT CREATININE: CREATININE: 0.9 mg/dL (ref 0.44–1.00)

## 2018-03-02 MED ORDER — IOPAMIDOL (ISOVUE-370) INJECTION 76%
75.0000 mL | Freq: Once | INTRAVENOUS | Status: AC | PRN
  Administered 2018-03-02: 75 mL via INTRAVENOUS

## 2018-03-07 ENCOUNTER — Telehealth (HOSPITAL_COMMUNITY): Payer: Self-pay

## 2018-03-07 NOTE — Telephone Encounter (Signed)
Pt agreed to f/u in 1 year with cta head/neck. AW  

## 2018-03-14 ENCOUNTER — Other Ambulatory Visit: Payer: Self-pay | Admitting: Cardiovascular Disease

## 2018-03-15 ENCOUNTER — Telehealth (HOSPITAL_COMMUNITY): Payer: Self-pay | Admitting: Cardiovascular Disease

## 2018-03-15 NOTE — Telephone Encounter (Signed)
Pt called today. She has been trying to schedule a follow-up appt for a follow-up since January. Unfortunately, her husband is now on [facility], and she is worried about missing time with her husband. She is not having any issues now, and should be set on her medication for a while.  She asked to speak to you directly, because you would be familiar with her and her husband's history.

## 2018-03-16 NOTE — Telephone Encounter (Signed)
I spoke with pt. She is due for yearly follow up at the end of March.  She is doing fine and would like to wait until April. I scheduled her to see Dr. Clifton James on April 23,2020 at 10:40

## 2018-03-30 ENCOUNTER — Telehealth (INDEPENDENT_AMBULATORY_CARE_PROVIDER_SITE_OTHER): Payer: Medicare HMO | Admitting: Family Medicine

## 2018-03-30 ENCOUNTER — Other Ambulatory Visit (INDEPENDENT_AMBULATORY_CARE_PROVIDER_SITE_OTHER): Payer: Medicare HMO

## 2018-03-30 ENCOUNTER — Other Ambulatory Visit: Payer: Self-pay

## 2018-03-30 DIAGNOSIS — R300 Dysuria: Secondary | ICD-10-CM

## 2018-03-30 LAB — POCT URINALYSIS DIPSTICK
Bilirubin, UA: NEGATIVE
GLUCOSE UA: NEGATIVE
Ketones, UA: NEGATIVE
Nitrite, UA: NEGATIVE
Protein, UA: NEGATIVE
Spec Grav, UA: >=1.03 — AB (ref 1.010–1.025)
Urobilinogen, UA: 0.2 E.U./dL
pH, UA: 5.5 (ref 5.0–8.0)

## 2018-03-30 MED ORDER — CEPHALEXIN 500 MG PO CAPS: 500.0000 mg | ORAL_CAPSULE | Freq: Two times a day (BID) | ORAL | 0 refills | Status: DC

## 2018-03-30 NOTE — Addendum Note (Signed)
Addended by: Eustaquio Boyden on: 03/30/2018 02:44 PM   Modules accepted: Orders

## 2018-03-30 NOTE — Progress Notes (Signed)
   MCKINZLEY SONNENTAG - 78 y.o. female  MRN [id number]  Date of Birth: [birth year]  PCP: Joaquim Nam, MD  This service was provided via telemedicine. Phone Visit performed on 03/30/2018    Rationale for phone visit reviewed. Patient consented to telephone encounter.    Location of patient: home Location of provider: office,  @ [facility] Name of referring provider: N/A   Names of persons and role in encounter: Provider: Eustaquio Boyden, MD  Patient: Kelsey Dunn  Other: N/A   Time on call: 12:21pm - 12:29pm   Subjective: CC: dysuria, itching HPI:  3d h/o dysuria, urgency, frequency, vaginal itching. Tmax 99.7.  No fevers/chills, vaginal discharge, hematuria, abd pain, flank pain, n/v.   She has collected urine specimen at home and will bting it in today.   Avoids caffeine. Feels she is staying well hydrated with water.   States unable to come into office as husband in hospice actively dying. Staying at home, increased stress as caregiver.   Last treated for possible UTI 07/2017. UCx at that time grew 50-100k strep viridans.     Objective/Observations:   No physical exam or vital signs collected unless specifically identified.  Respiratory status: speaks in complete sentences without evident shortness of breath.   Assessment/Plan:  Dysuria Symptoms suspicious for lower urinary tract infection. She will drop off urine for UA/micro and possible culture and then determine treatment based on that.    I discussed the assessment and treatment plan with the patient. The patient was provided an opportunity to ask questions and all were answered. The patient agreed with the plan and demonstrated an understanding of the instructions.  Lab Orders  No laboratory test(s) ordered today    No orders of the defined types were placed in this encounter.   The patient was advised to call back or seek an in-person evaluation if the symptoms worsen or if the condition  fails to improve as anticipated.  Eustaquio Boyden, MD

## 2018-03-30 NOTE — Assessment & Plan Note (Signed)
Symptoms suspicious for lower urinary tract infection. She will drop off urine for UA/micro and possible culture and then determine treatment based on that.

## 2018-04-01 LAB — URINE CULTURE
MICRO NUMBER:: 352562
SPECIMEN QUALITY:: ADEQUATE

## 2018-04-20 ENCOUNTER — Other Ambulatory Visit: Payer: Self-pay

## 2018-04-20 ENCOUNTER — Ambulatory Visit (INDEPENDENT_AMBULATORY_CARE_PROVIDER_SITE_OTHER): Payer: Medicare HMO | Admitting: *Deleted

## 2018-04-20 DIAGNOSIS — I441 Atrioventricular block, second degree: Secondary | ICD-10-CM

## 2018-04-20 DIAGNOSIS — I5032 Chronic diastolic (congestive) heart failure: Secondary | ICD-10-CM

## 2018-04-20 LAB — CUP PACEART REMOTE DEVICE CHECK
Battery Impedance: 525 Ohm
Battery Remaining Longevity: 80 mo
Battery Voltage: 2.78 V
Brady Statistic AP VP Percent: 36 %
Brady Statistic AP VS Percent: 0 %
Brady Statistic AS VP Percent: 64 %
Brady Statistic AS VS Percent: 0 %
Date Time Interrogation Session: 20200415120724
Implantable Lead Implant Date: 20131106
Implantable Lead Implant Date: 20131106
Implantable Lead Location: 753859
Implantable Lead Location: 753860
Implantable Lead Model: 5076
Implantable Lead Model: 5092
Implantable Pulse Generator Implant Date: 20131106
Lead Channel Impedance Value: 418 Ohm
Lead Channel Impedance Value: 558 Ohm
Lead Channel Pacing Threshold Amplitude: 0.5 V
Lead Channel Pacing Threshold Amplitude: 0.875 V
Lead Channel Pacing Threshold Pulse Width: 0.4 ms
Lead Channel Pacing Threshold Pulse Width: 0.4 ms
Lead Channel Setting Pacing Amplitude: 2 V
Lead Channel Setting Pacing Amplitude: 2.5 V
Lead Channel Setting Pacing Pulse Width: 0.4 ms
Lead Channel Setting Sensing Sensitivity: 2 mV

## 2018-04-21 ENCOUNTER — Telehealth: Payer: Self-pay | Admitting: Cardiovascular Disease

## 2018-04-21 NOTE — Telephone Encounter (Signed)
OK. Thanks.

## 2018-04-21 NOTE — Telephone Encounter (Signed)
Spoke with the pt and she wanted to reschedule her 04/28/18 appt with Dr. Clifton James... I offered a tele visit but pt declined.. she has an old [employer] phone and says she wants no part of it... she rescheduled for 06/20/18 but I talked with her about the increased potential that we still may have to take COVID precautions re: bringing her into the office... Pt has been taking care of her husband.. he is home on Hospice and not doing well at all.[facility] is calling her 2-3 times a week to avoid face to face contact.   I spent some time talking with her... she is having someone deliver groceries... she is very confident in her relationship with our office and Dr. Clifton James and will call if she needs anything at all. I stressed to her that we are here anytime she needs [place] to please call...   She says she will deal with the June appt as it gets closer... and determine at that time how to proceed with her visit.

## 2018-04-21 NOTE — Telephone Encounter (Signed)
New Message   Pt is calling to speak with Dennie Bible about her appt   Please call back

## 2018-04-24 ENCOUNTER — Other Ambulatory Visit: Payer: Self-pay | Admitting: Family Medicine

## 2018-04-27 NOTE — Progress Notes (Signed)
Remote pacemaker transmission.   

## 2018-04-28 ENCOUNTER — Ambulatory Visit: Payer: Medicare HMO | Admitting: Cardiovascular Disease

## 2018-06-07 ENCOUNTER — Telehealth: Payer: Self-pay | Admitting: *Deleted

## 2018-06-07 ENCOUNTER — Other Ambulatory Visit: Payer: Self-pay | Admitting: Cardiovascular Disease

## 2018-06-07 NOTE — Telephone Encounter (Signed)
I spoke with pt about her upcoming appointment on June 15.  She reports she is doing fine and does not want a video or phone visit at this time.  Is caring for her husband and does not want an in office visit.  She would like to wait and see Dr. Clifton James later this year.  Visit rescheduled to September 16,2020 at 10:40

## 2018-06-20 ENCOUNTER — Ambulatory Visit: Payer: Medicare HMO | Admitting: Cardiovascular Disease

## 2018-07-18 ENCOUNTER — Telehealth: Payer: Self-pay | Admitting: Family Medicine

## 2018-07-20 ENCOUNTER — Ambulatory Visit (INDEPENDENT_AMBULATORY_CARE_PROVIDER_SITE_OTHER): Payer: Medicare HMO | Admitting: *Deleted

## 2018-07-20 DIAGNOSIS — I441 Atrioventricular block, second degree: Secondary | ICD-10-CM

## 2018-07-20 LAB — CUP PACEART REMOTE DEVICE CHECK
Battery Impedance: 575 Ohm
Battery Remaining Longevity: 77 mo
Battery Voltage: 2.78 V
Brady Statistic AP VP Percent: 36 %
Brady Statistic AP VS Percent: 0 %
Brady Statistic AS VP Percent: 64 %
Brady Statistic AS VS Percent: 0 %
Date Time Interrogation Session: 20200715120937
Implantable Lead Implant Date: 20131106
Implantable Lead Implant Date: 20131106
Implantable Lead Location: 753859
Implantable Lead Location: 753860
Implantable Lead Model: 5076
Implantable Lead Model: 5092
Implantable Pulse Generator Implant Date: 20131106
Lead Channel Impedance Value: 412 Ohm
Lead Channel Impedance Value: 567 Ohm
Lead Channel Pacing Threshold Amplitude: 0.5 V
Lead Channel Pacing Threshold Amplitude: 1 V
Lead Channel Pacing Threshold Pulse Width: 0.4 ms
Lead Channel Pacing Threshold Pulse Width: 0.4 ms
Lead Channel Setting Pacing Amplitude: 2 V
Lead Channel Setting Pacing Amplitude: 2.5 V
Lead Channel Setting Pacing Pulse Width: 0.4 ms
Lead Channel Setting Sensing Sensitivity: 2 mV

## 2018-07-26 IMAGING — CT CT HEAD CODE STROKE
4 series · 17 of 47 positions shown, 19 images · non-contrast
Comparison: CT of the head 03/26/2014

CLINICAL DATA: Right-sided facial numbness and drawing.

EXAM:
CT HEAD WITHOUT CONTRAST
TECHNIQUE: Contiguous axial images were obtained from the base of the skull
through the vertex without intravenous contrast.

[Series 2: head 2.0 bone · axial · 0.41mm/px · z∈[-37,+19]mm · 4 of 80 slices shown]
[im 8/80  bone]
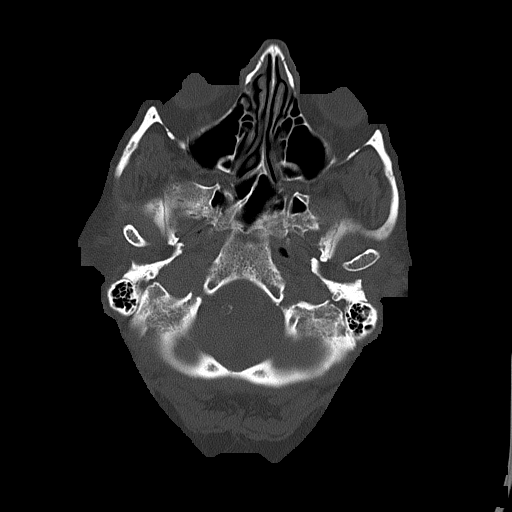
[im 16/80  bone]
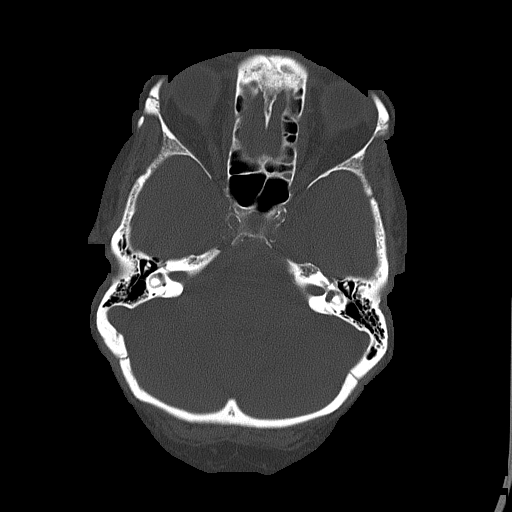
[im 24/80  bone]
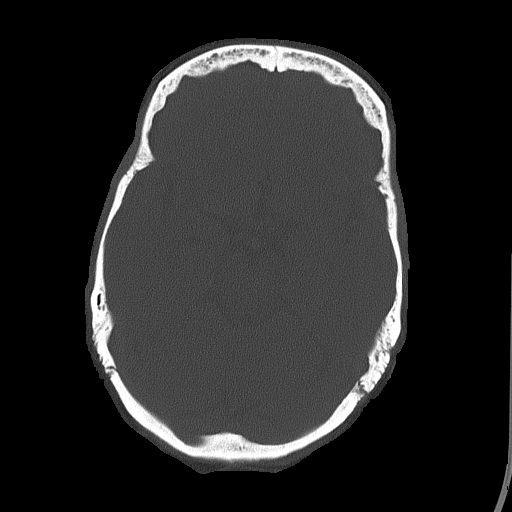
[im 36/80  bone]
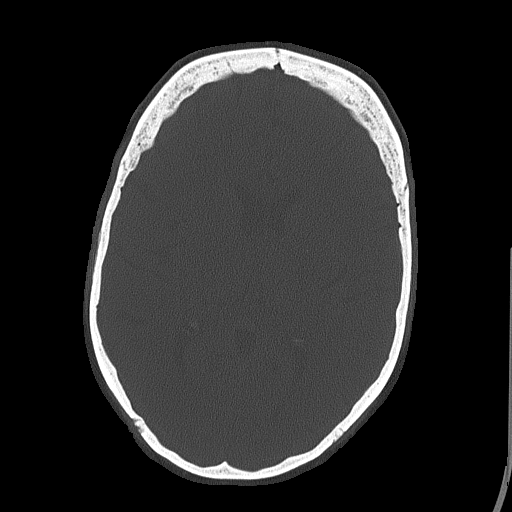

[Series 3: head 5.0 st · axial · 0.41mm/px · z∈[-36,+84]mm · 7 of 32 slices shown, 9 images]
[im 4/32  brain]
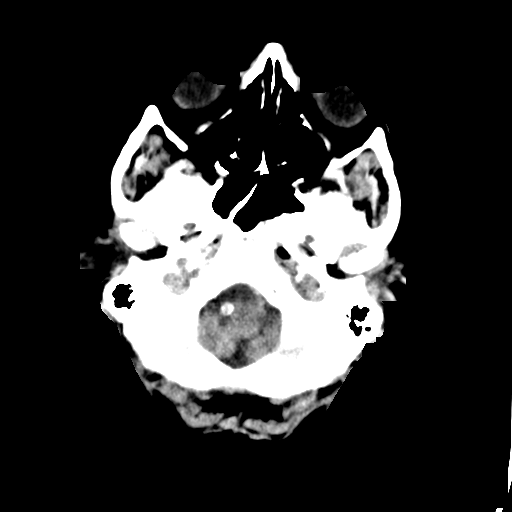
[im 4/32  bone]
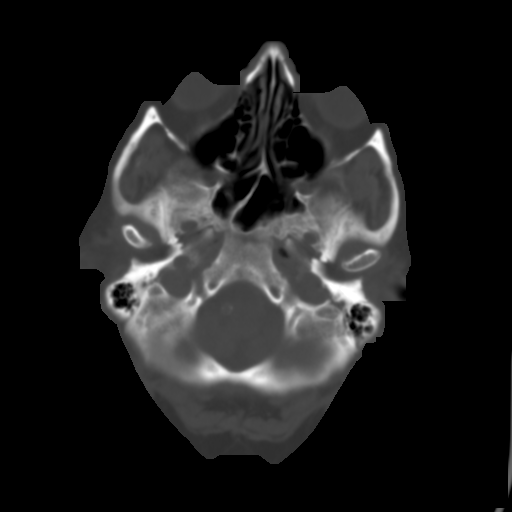
[im 8/32  brain]
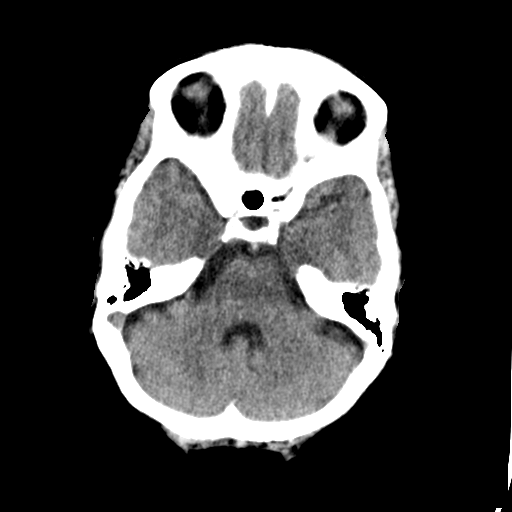
[im 12/32  brain]
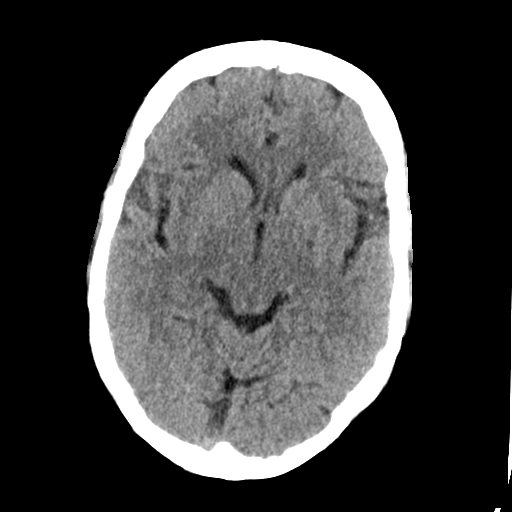
[im 16/32  brain]
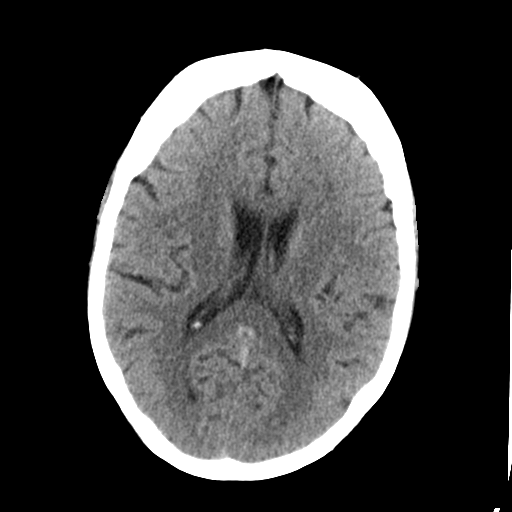
[im 20/32  brain]
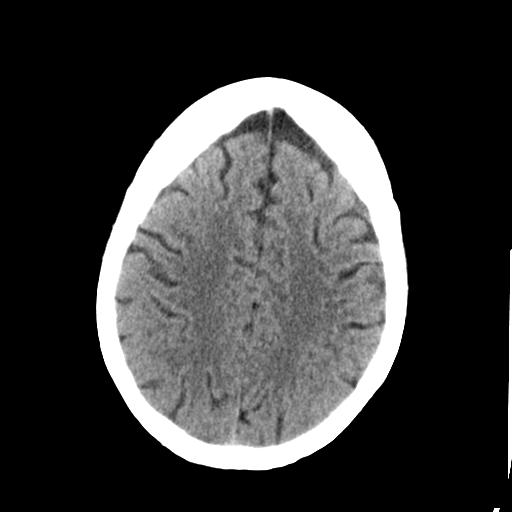
[im 20/32  bone]
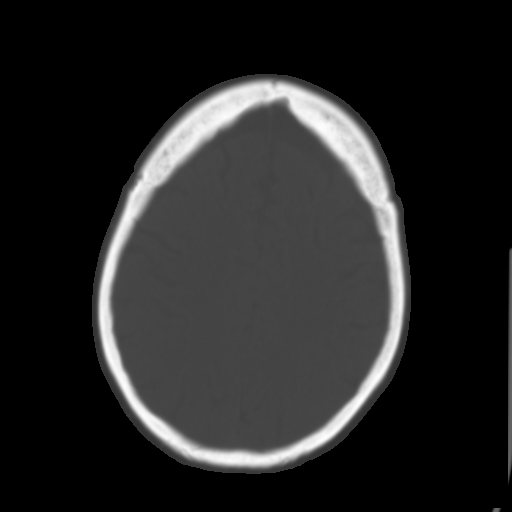
[im 24/32  brain]
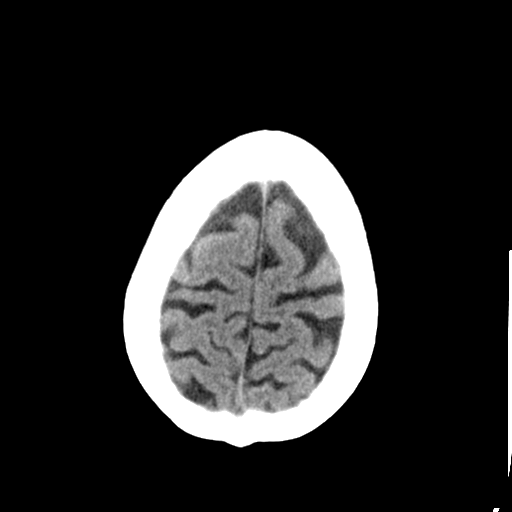
[im 28/32  brain]
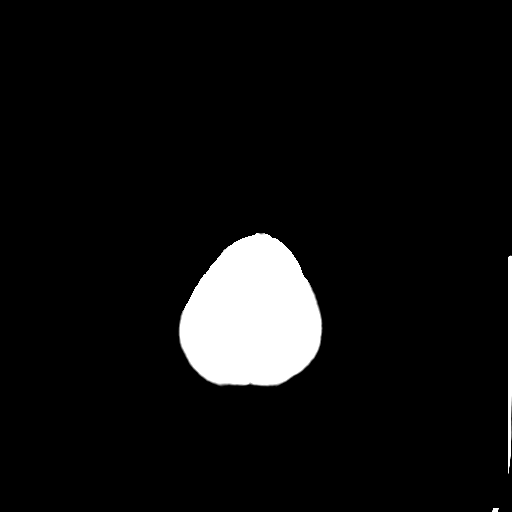

[Series 4: head 3.0 cor st · coronal · 0.29mm/px · 3 of 67 slices shown]
[im 23/67  brain]
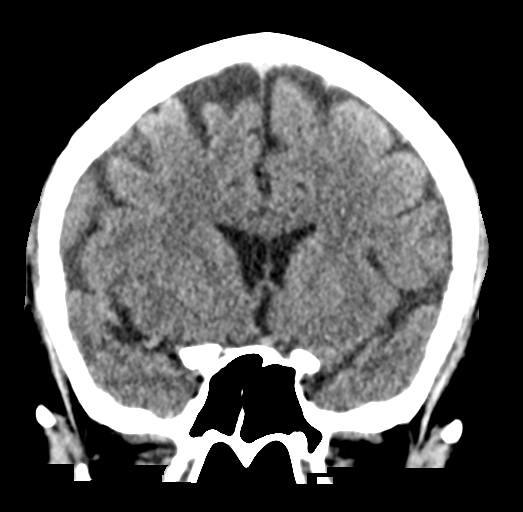
[im 30/67  brain]
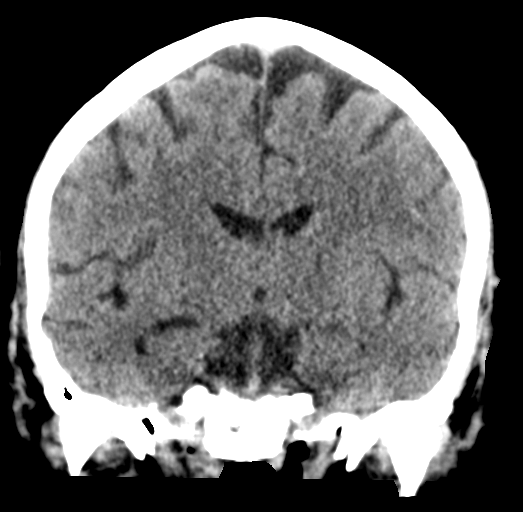
[im 37/67  brain]
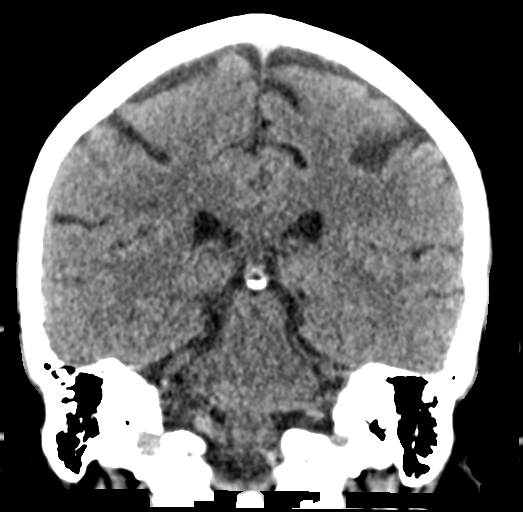

[Series 5: head 3.0 sag st · sagittal · 0.31mm/px · 3 of 67 slices shown]
[im 23/67  brain]
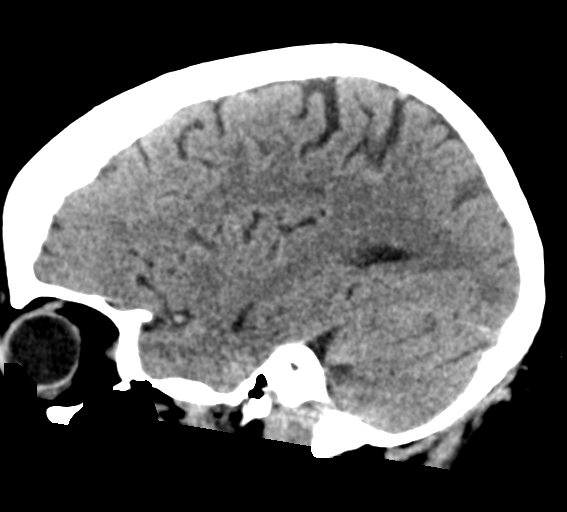
[im 34/67  brain]
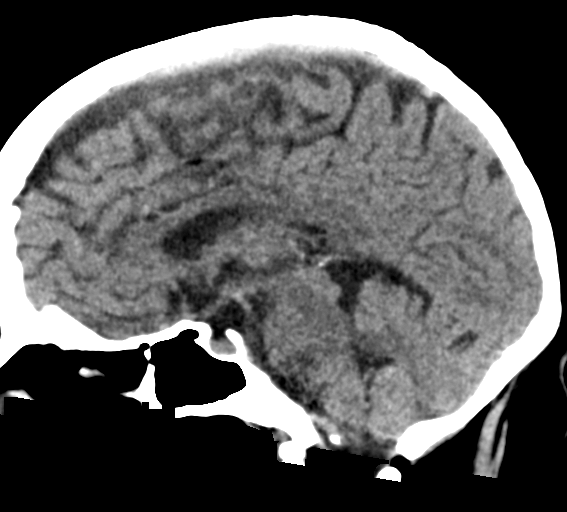
[im 45/67  brain]
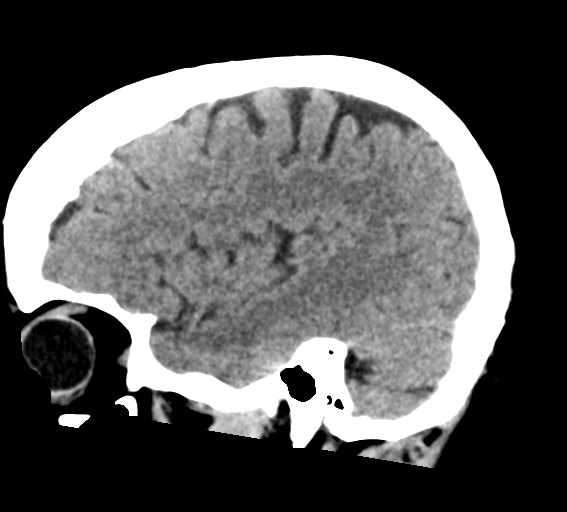

[17 of 47 positions shown; findings below may reference images not displayed]

FINDINGS: No mass effect or midline shift. No evidence of acute intracranial
hemorrhage, or infarction. Sub cm focus of hypoattenuation in the
subcortical left frontal lobe is seen with uncertain significance.
No abnormal extra-axial fluid collections. Gray-white matter
differentiation is otherwise normal. Basal cisterns are preserved.
Atherosclerotic vascular calcifications are noted at the skullbase.

No depressed skull fractures. Visualized paranasal sinuses and
mastoid air cells are not opacified.
IMPRESSION: No acute intracranial abnormality.

Small area of hypoattenuation in the subcortical left frontal lobe
which may represent dilated perivascular space versus a small
subacute lacunar infarct.

## 2018-07-26 NOTE — Telephone Encounter (Signed)
Best number  [phone]  Pt called stating she talked to her pharmacy And they told her they were having a hard time getting losartan.  She has enough this week and next week.    She wanted to know if there was something else she can take  [facility] [address]

## 2018-07-27 MED ORDER — OLMESARTAN MEDOXOMIL 40 MG PO TABS: 40.0000 mg | ORAL_TABLET | Freq: Every day | ORAL | 1 refills | Status: DC

## 2018-07-27 NOTE — Addendum Note (Signed)
Addended by: Tonia Ghent on: 07/27/2018 04:12 PM   Modules accepted: Orders

## 2018-07-27 NOTE — Telephone Encounter (Signed)
Prescription sent for Benicar.  Whenever she needs to make the transition she can take the losartan one day and then change directly to the Benicar the next day.  Please verify that she still taking amlodipine and hydrochlorothiazide separately.  It is likely reasonable for her to check her blood pressure a few times after she makes the change from losartan to Benicar.  Update me as needed about her blood pressure.    Please also check on this patient to see how she is doing in general with everything going on regarding the pandemic and also her husband who is chronically ill.  Please give her my regards and tell her I am thinking about both of them.  Thanks.

## 2018-07-28 ENCOUNTER — Other Ambulatory Visit: Payer: Self-pay | Admitting: *Deleted

## 2018-07-28 MED ORDER — LEVOTHYROXINE SODIUM 100 MCG PO TABS: 100.0000 ug | ORAL_TABLET | Freq: Every day | ORAL | 1 refills | Status: DC

## 2018-07-28 NOTE — Telephone Encounter (Signed)
Patient advised.

## 2018-07-30 ENCOUNTER — Encounter: Payer: Self-pay | Admitting: Cardiology

## 2018-07-30 NOTE — Progress Notes (Signed)
Remote pacemaker transmission.   

## 2018-08-30 ENCOUNTER — Ambulatory Visit: Payer: Medicare HMO

## 2018-08-31 ENCOUNTER — Other Ambulatory Visit: Payer: Self-pay | Admitting: Cardiovascular Disease

## 2018-09-06 ENCOUNTER — Encounter: Payer: Medicare HMO | Admitting: Family Medicine

## 2018-09-15 ENCOUNTER — Telehealth: Payer: Self-pay | Admitting: Family Medicine

## 2018-09-15 NOTE — Telephone Encounter (Signed)
Patient is requesting a call back,she stated she has a couple questions she would like to discuss with you    C/B # [phone]

## 2018-09-15 NOTE — Telephone Encounter (Signed)
error 

## 2018-09-15 NOTE — Telephone Encounter (Signed)
1) Patient asks if she (and her husband) needs a flu shot?

## 2018-09-16 NOTE — Telephone Encounter (Signed)
Yes to both.  Please let her know I am thinking about her and let me know if there is anything I can do in the meantime.  Thanks.

## 2018-09-16 NOTE — Telephone Encounter (Signed)
Patient advised and will make arrangements.

## 2018-09-20 NOTE — Progress Notes (Signed)
Chief Complaint  Patient presents with  . Follow-up    CAD   History of Present Illness: 78 yo female with h/o CAD, HTN, hyperlipidemia, hypothyroidism, AV block s/p PPM, CVA and basilar artery stenosis, mitral regurgitation and aortic stenosis here today for follow up. Left heart cath on 06/13/09 and she was found to have a severe stenosis in the RCA. There was minor disease in the LAD and Circumflex. A drug eluting stent was placed in the RCA. Repeat cardiac cath in August 2013 She was admitted to [facility] 08/10/11 with dizziness, chest pain and repeat cath showed stable disease. She was observed to have second degree AV block demonstrated with RBBB. Her coreg was stopped. She was discharged and wore an event monitor. This did not document arrhythmias or advanced AV block. There were no episodes of AV block documented. GXT was performed in our office 09/01/11. She was observed during the study to have a LBBB with exertion. She developed exertional symptoms of fatigue, SOB, and CP which corresponded to her LBBB. She was seen by Dr. Rayann Heman and continued to have episodes of palpitations and dizziness with moderate activity. A permanent pacemaker was placed on 11/11/11 by Dr. Rayann Heman. She has been undergoing cerebral angiograms for basilar artery stenosis and has had angiplasty of the basilar artery in August 2010. She is known to have an occluded left common carotid artery. She had a CVA in December 2012. She was Admitted to [facility] July 2015 with chest pain. Cardiac cath 07/27/13 with stable CAD.Echo July 2017 with LVEF=60-65%. Grade 2 diastolic dysfunction. Mild aortic valve stenosis. Mild to moderate MR.   She is here today for follow up. The patient denies any chest pain, dyspnea, palpitations, lower extremity edema, orthopnea, PND, dizziness, near syncope or syncope. She is feeling well overall. Mr. Suh is on Hospice and doing poorly. She has been under tremendous stress at home.   Primary Care  Physician: Tonia Ghent, MD  Past Medical History:  Diagnosis Date  . Arthritis    "fingers" (Mar 07, 2013)  . Basilar artery stenosis    90%  . Bladder spasms   . Blood transfusion 1960's   w/childbirth  . CAD (coronary artery disease)    a. 06/2009 cath: severe stenosis RCA with placement drug eluting stent;  b. 08/2011 cath: normal left main, luminal LAD irregularities, luminal LCx irregularities, luminal RCA irregularities and no ISR to mid-RCA DES, LVEF 60-65%; c. 02/2013 nonischemic CL, EF 79%.  . Family history of anesthesia complication    "first cousin had fever and almost died" (March 07, 2013)  . Fibromyalgia   . HLD (hyperlipidemia) 01/1997  . Hypertension   . Hypothyroidism 1990  . IC (interstitial cystitis)   . Labile hypertension 08/1991  . Left-sided carotid artery obstruction   . Malignant hyperthermia    "told to warn everybody before I had surgery that my 1st cousin had malignant hyperthermia; I've had symptoms of it too" (07-Mar-2013)  . Obesity   . Pacemaker 11/11/2011   dual chamber pacemaker  . Pneumonia    "twice" (03/07/13)  . Second degree Mobitz II AV block   . TIA (transient ischemic attack)    multiple/notes 08/02/2015    Past Surgical History:  Procedure Laterality Date  . basilar artery angioplasty  09/04/08   sm right brachial hematoma  . Bilateral Vert & Subclavian Angiogram  07/19/08   85-90% stenosis mid basilar art; occluded left vert artery; occluded left comm carotid artery w/ collat flow  .  BLADDER SUSPENSION  1974  . CARDIAC CATHETERIZATION  1993   normal  . carotid arteriogram     bilat  . Carotid US  11/93   Occl L comm carotid  . Carotid US  4/98   L comm occl R-ok  . Carotid US  01/30/03   L occl  R less 40%  . CATARACT EXTRACTION W/ INTRAOCULAR LENS IMPLANT Bilateral 2007-2008  . Cath single vess dz  06/13/09   70% stenosis RCA EF 65-70%  . CHOLECYSTECTOMY  1985  . CORONARY ANGIOPLASTY WITH STENT PLACEMENT  06/19/09  . DEXA  10/15/04    Troch -0.4 o/w pos  . EMG  1/01   LE, RUE normal  . EYE SURGERY    . INSERT / REPLACE / REMOVE PACEMAKER  11/11/2011   MDT Adapta L implanted by Dr Johney FrameAllred. [employer]  . LEFT HEART CATHETERIZATION WITH CORONARY ANGIOGRAM N/A 08/10/2011   Procedure: LEFT HEART CATHETERIZATION WITH CORONARY ANGIOGRAM;  Surgeon: Kathleene Hazelhristopher D Kisha Messman, MD;  [facility];  Service: Cardiovascular;  Laterality: N/A;  . LEFT HEART CATHETERIZATION WITH CORONARY ANGIOGRAM N/A 07/28/2013   Procedure: LEFT HEART CATHETERIZATION WITH CORONARY ANGIOGRAM;  Surgeon: Kathleene Hazelhristopher D Griffin Dewilde, MD;  [facility];  Service: Cardiovascular;  Laterality: N/A;  . PERMANENT PACEMAKER INSERTION N/A 11/11/2011   Procedure: PERMANENT PACEMAKER INSERTION;  Surgeon: Hillis RangeJames Allred, MD;  [facility];  Service: Cardiovascular;  Laterality: N/A;  . RENAL ANGIOPLASTY  1993   normal  . US RENAL/AORTA  12/93   Normal  . VAGINAL HYSTERECTOMY  1974   dysmennorhea  . Visual evoked response  11/93; 1/01   normal    Current Outpatient Medications  Medication Sig Dispense Refill  . acetaminophen (TYLENOL) 500 MG tablet Take 1,000 mg by mouth every 6 (six) hours as needed for moderate pain.     Marland Kitchen. amLODipine (NORVASC) 5 MG tablet Take 1 tablet (5 mg total) by mouth 2 (two) times daily. Please keep upcoming appt in September with Dr. Clifton JamesMcalhany for future refills. Thank you 180 tablet 0  . aspirin EC 81 MG tablet Take 1 tablet (81 mg total) by mouth daily. 90 tablet 3  . Cholecalciferol (VITAMIN D) 2000 units CAPS Take 1 capsule (2,000 Units total) by mouth daily.    . clopidogrel (PLAVIX) 75 MG tablet TAKE 1 TABLET (75 MG TOTAL) BY MOUTH DAILY. 90 tablet 2  . hydrochlorothiazide (HYDRODIURIL) 25 MG tablet TAKE 1 TABLET BY MOUTH EVERY DAY WITH LOSARTAN 90 tablet 0  . lansoprazole (PREVACID) 15 MG capsule Take 15 mg by mouth daily as needed (reflux).     Marland Kitchen. levothyroxine (SYNTHROID) 100 MCG tablet Take 1 tablet (100 mcg total)  by mouth daily. 90 tablet 1  . loratadine (CLARITIN) 10 MG tablet Take 10 mg by mouth daily. As needed    . nitroGLYCERIN (NITROSTAT) 0.4 MG SL tablet Place 0.4 mg under the tongue every 5 (five) minutes as needed for chest pain (MAX 3 TABLETS).     Marland Kitchen. olmesartan (BENICAR) 40 MG tablet Take 1 tablet (40 mg total) by mouth daily. 90 tablet 1  . Red Yeast Rice 600 MG CAPS Take 600 mg by mouth See admin instructions. Takes 2 times daily, except mon and fri-takes 1 time daily only    . valACYclovir (VALTREX) 1000 MG tablet Take 2 tablets (2,000 mg total) by mouth 2 (two) times daily. 4 tablet 0   No current facility-administered medications for this visit.  Allergies  Allergen Reactions  . Anesthetics, Amide Other (See Comments)    REACTION:FEVER, SYNCOPE  . Codeine Nausea And Vomiting and Other (See Comments)    Hallucinations  . Nitrofurantoin Nausea Only and Other (See Comments)    Hallucinations  . Orphenadrine Citrate Nausea And Vomiting and Other (See Comments)     Hallucination  . Pravastatin Other (See Comments)    Myalgias  . Simvastatin Other (See Comments)    myalgias  . Ibuprofen     Would avoid if possible  . Pentosan Polysulfate Sodium Rash and Other (See Comments)     rash/ severe headache    Social History   Socioeconomic History  . Marital status: Married    Spouse name: Not on file  . Number of children: 1  . Years of education: Not on file  . Highest education level: Not on file  Occupational History  . Occupation: Bookkeeper-part time  . Occupation: retired    Associate Professor: RETIRED  Social Needs  . Financial resource strain: Not on file  . Food insecurity    Worry: Not on file    Inability: Not on file  . Transportation needs    Medical: Not on file    Non-medical: Not on file  Tobacco Use  . Smoking status: Never Smoker  . Smokeless tobacco: Never Used  Substance and Sexual Activity  . Alcohol use: No    Alcohol/week: 0.0 standard drinks  . Drug  use: No  . Sexual activity: Not Currently    Birth control/protection: Post-menopausal  Lifestyle  . Physical activity    Days per week: Not on file    Minutes per session: Not on file  . Stress: Not on file  Relationships  . Social Musician on phone: Not on file    Gets together: Not on file    Attends religious service: Not on file    Active member of club or organization: Not on file    Attends meetings of clubs or organizations: Not on file    Relationship status: Not on file  . Intimate partner violence    Fear of current or ex partner: Not on file    Emotionally abused: Not on file    Physically abused: Not on file    Forced sexual activity: Not on file  Other Topics Concern  . 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   Married 1962; 1 child   Drinks coffee and coke daily     Family History  Problem Relation Age of Onset  . Lung cancer Father   . Heart attack Mother   . Lymphoma Mother        s/p chemo  . Stroke Mother        x 2  . Diabetes Brother   . Breast cancer Maternal Aunt   . Colon cancer Neg Hx     Review of Systems:  As stated in the HPI and otherwise negative.   BP (!) 160/62   Pulse (!) 59   Ht 4' 11.5" (1.511 m)   Wt 169 lb (76.7 kg)   SpO2 99%   BMI 33.56 kg/m   Physical Examination:  General: Well developed, well nourished, NAD  HEENT: OP clear, mucus membranes moist  SKIN: warm, dry. No rashes. Neuro: No focal deficits  Musculoskeletal: Muscle strength 5/5 all ext  Psychiatric: Mood and affect normal  Neck: No JVD,  no carotid bruits, no thyromegaly, no lymphadenopathy.  Lungs:Clear bilaterally, no wheezes, rhonci, crackles Cardiovascular: Regular rate and rhythm. No murmurs, gallops or rubs. Abdomen:Soft. Bowel sounds present. Non-tender.  Extremities: No lower extremity edema. Pulses are 2 + in the bilateral DP/PT.  Cardiac cath 07/27/13: Left main:  No obstructive disease.  Left Anterior Descending Artery: Large caliber vessel that courses to the apex. The mid vessel has diffuse 20% stenosis. There is a very small caliber diagonal branch with proximal 40% stenosis. There are no flow limiting lesions noted.  Circumflex Artery: Moderate caliber vessel with moderate caliber obtuse marginal branch. No obstructive disease.  Right Coronary Artery: Large caliber vessel with 20% mid stenosis, patent distal stent without restenosis.  Left Ventricular Angiogram: LVEF=65-70%.  Impression:  1. Single vessel CAD with patent stent RCA  2. Normal LV function  3. Non-cardiac chest pain   Echo July 2017: - Left ventricle: The cavity size was normal. Wall thickness was   normal. Systolic function was normal. The estimated ejection   fraction was in the range of 60% to 65%. Wall motion was normal;   there were no regional wall motion abnormalities. Features are   consistent with a pseudonormal left ventricular filling pattern,   with concomitant abnormal relaxation and increased filling   pressure (grade 2 diastolic dysfunction). - Ventricular septum: Septal motion showed paradox. These changes   are consistent with right ventricular pacing. - Aortic valve: There was mild stenosis. Valve area (VTI): 2.05   cm^2. Valve area (Vmax): 1.87 cm^2. Valve area (Vmean): 1.78   cm^2. - Mitral valve: There was mild to moderate regurgitation directed   centrally. - Left atrium: The atrium was mildly dilated.  ------------------------------------------------------------------- Labs, prior tests, procedures, and surgery: Permanent pacemaker system implantation.  EKG:  EKG is not ordered today. The ekg ordered today demonstrates .   Recent Labs: 03/02/2018: Creatinine, Ser 0.90   Lipid Panel    Component Value Date/Time   CHOL 170 08/20/2017 1047   TRIG 178.0 (H) 08/20/2017 1047   HDL 48.80 08/20/2017 1047   CHOLHDL 3 08/20/2017 1047   VLDL 35.6  08/20/2017 1047   LDLCALC 86 08/20/2017 1047   LDLDIRECT 145.0 10/01/2014 1336     Wt Readings from Last 3 Encounters:  09/21/18 169 lb (76.7 kg)  08/26/17 180 lb 4 oz (81.8 kg)  08/20/17 180 lb 4 oz (81.8 kg)     Other studies Reviewed: Additional studies/ records that were reviewed today include: . Review of the above records demonstrates:    Assessment and Plan:   1. CAD without angina: She has no chest pain. Last cath in July 2015 with stable CAD. Continue ASA and Plavix given prior CVA and history of coronary stent.   She does not tolerate statins.   2. High grade symptomatic AV block: s/p PPM 11/10/11 per Dr. Johney FrameAllred. She is followed in the pacer clinic.   3. HTN: BP is well controlled at home. Recent changes in primary care to Benicar.   4. Chronic diastolic CHF: Weight is stable. No evidence of volume overload. She uses Lasix as needed.   5. Aortic valve stenosis: Mild by echo in July 2017. Will repeat echo in January 2021.   6. Mitral valve regurgitation: Mild by echo in July 2017. Will repeat January 2021.   Current medicines are reviewed at length with the patient today.  The patient does not have concerns regarding medicines.  The following changes have been made:  no change  Labs/ tests ordered today include:   Orders Placed This Encounter  Procedures  . EKG 12-Lead  . ECHOCARDIOGRAM COMPLETE    Disposition:   FU with me in 12  months  Signed, Verne Carrow, MD 09/21/2018 12:27 PM    [facility] [address] [place], [place], [place]  [address] Phone: [phone]; Fax: [phone]

## 2018-09-21 ENCOUNTER — Encounter (INDEPENDENT_AMBULATORY_CARE_PROVIDER_SITE_OTHER): Payer: Self-pay

## 2018-09-21 ENCOUNTER — Ambulatory Visit (INDEPENDENT_AMBULATORY_CARE_PROVIDER_SITE_OTHER): Payer: Medicare HMO | Admitting: Cardiovascular Disease

## 2018-09-21 ENCOUNTER — Other Ambulatory Visit: Payer: Self-pay

## 2018-09-21 ENCOUNTER — Encounter: Payer: Self-pay | Admitting: Cardiovascular Disease

## 2018-09-21 VITALS — BP 160/62 | HR 59 | Ht 59.5 in | Wt 169.0 lb

## 2018-09-21 DIAGNOSIS — I350 Nonrheumatic aortic (valve) stenosis: Secondary | ICD-10-CM

## 2018-09-21 DIAGNOSIS — I10 Essential (primary) hypertension: Secondary | ICD-10-CM

## 2018-09-21 DIAGNOSIS — I441 Atrioventricular block, second degree: Secondary | ICD-10-CM | POA: Diagnosis not present

## 2018-09-21 DIAGNOSIS — I340 Nonrheumatic mitral (valve) insufficiency: Secondary | ICD-10-CM | POA: Diagnosis not present

## 2018-09-21 DIAGNOSIS — I5032 Chronic diastolic (congestive) heart failure: Secondary | ICD-10-CM | POA: Diagnosis not present

## 2018-09-21 DIAGNOSIS — I2510 Atherosclerotic heart disease of native coronary artery without angina pectoris: Secondary | ICD-10-CM

## 2018-09-21 NOTE — Patient Instructions (Signed)
Medication Instructions:  Your physician recommends that you continue on your current medications as directed. Please refer to the Current Medication list given to you today.  If you need a refill on your cardiac medications before your next appointment, please call your pharmacy.   Lab work: None If you have labs (blood work) drawn today and your tests are completely normal, you will receive your results only by: Marland Kitchen MyChart Message (if you have MyChart) OR . A paper copy in the mail If you have any lab test that is abnormal or we need to change your treatment, we will call you to review the results.  Testing/Procedures: Your physician has requested that you have an echocardiogram in January or February. Echocardiography is a painless test that uses sound waves to create images of your heart. It provides your doctor with information about the size and shape of your heart and how well your heart's chambers and valves are working. This procedure takes approximately one hour. There are no restrictions for this procedure.    Follow-Up: At [facility], you and your health needs are our priority.  As part of our continuing mission to provide you with exceptional heart care, we have created designated Provider Care Teams.  These Care Teams include your primary Cardiologist (physician) and Advanced Practice Providers (APPs -  Physician Assistants and Nurse Practitioners) who all work together to provide you with the care you need, when you need it. You will need a follow up appointment in 12 months.  Please call our office 2 months in advance to schedule this appointment.  You may see Lauree Chandler, MD or one of the following Advanced Practice Providers on your designated Care Team:   [place], PA-C Melina Copa, PA-C . Ermalinda Barrios, PA-C  Any Other Special Instructions Will Be Listed Below (If Applicable).

## 2018-10-11 ENCOUNTER — Ambulatory Visit (INDEPENDENT_AMBULATORY_CARE_PROVIDER_SITE_OTHER): Payer: Medicare HMO

## 2018-10-11 DIAGNOSIS — Z23 Encounter for immunization: Secondary | ICD-10-CM | POA: Diagnosis not present

## 2018-10-14 ENCOUNTER — Other Ambulatory Visit: Payer: Self-pay | Admitting: Family Medicine

## 2018-10-19 ENCOUNTER — Ambulatory Visit (INDEPENDENT_AMBULATORY_CARE_PROVIDER_SITE_OTHER): Payer: Medicare HMO | Admitting: *Deleted

## 2018-10-19 DIAGNOSIS — I441 Atrioventricular block, second degree: Secondary | ICD-10-CM | POA: Diagnosis not present

## 2018-10-19 DIAGNOSIS — G459 Transient cerebral ischemic attack, unspecified: Secondary | ICD-10-CM

## 2018-10-22 LAB — CUP PACEART REMOTE DEVICE CHECK
Battery Impedance: 650 Ohm
Battery Remaining Longevity: 73 mo
Battery Voltage: 2.78 V
Brady Statistic AP VP Percent: 36 %
Brady Statistic AP VS Percent: 0 %
Brady Statistic AS VP Percent: 64 %
Brady Statistic AS VS Percent: 0 %
Date Time Interrogation Session: 20201016190853
Implantable Lead Implant Date: 20131106
Implantable Lead Implant Date: 20131106
Implantable Lead Location: 753859
Implantable Lead Location: 753860
Implantable Lead Model: 5076
Implantable Lead Model: 5092
Implantable Pulse Generator Implant Date: 20131106
Lead Channel Impedance Value: 424 Ohm
Lead Channel Impedance Value: 578 Ohm
Lead Channel Pacing Threshold Amplitude: 0.5 V
Lead Channel Pacing Threshold Amplitude: 1.125 V
Lead Channel Pacing Threshold Pulse Width: 0.4 ms
Lead Channel Pacing Threshold Pulse Width: 0.4 ms
Lead Channel Setting Pacing Amplitude: 2 V
Lead Channel Setting Pacing Amplitude: 2.5 V
Lead Channel Setting Pacing Pulse Width: 0.4 ms
Lead Channel Setting Sensing Sensitivity: 2 mV

## 2018-11-02 ENCOUNTER — Other Ambulatory Visit: Payer: Self-pay | Admitting: Family Medicine

## 2018-11-02 NOTE — Progress Notes (Signed)
Remote pacemaker transmission.   

## 2018-11-18 ENCOUNTER — Ambulatory Visit (INDEPENDENT_AMBULATORY_CARE_PROVIDER_SITE_OTHER): Payer: Medicare HMO

## 2018-11-18 ENCOUNTER — Other Ambulatory Visit: Payer: Self-pay

## 2018-11-18 VITALS — BP 128/72

## 2018-11-18 DIAGNOSIS — Z0000 Encounter for general adult medical examination without abnormal findings: Secondary | ICD-10-CM

## 2018-11-18 NOTE — Progress Notes (Signed)
PCP notes:  Health Maintenance: Declined colonoscopy and mammogram Declined Tdap and shingrix  Abnormal Screenings: none   Patient concerns: none   Nurse concerns: none   Next PCP appt.: 11/25/2018 @ 2:15 pm

## 2018-11-18 NOTE — Progress Notes (Signed)
Subjective:   Kelsey Dunn is a 78 y.o. female who presents for Medicare Annual (Subsequent) preventive examination.  Review of Systems: N/A   This visit is being conducted through telemedicine via telephone at the nurse health advisor's home address due to the COVID-19 pandemic. This patient has given me verbal consent via doximity to conduct this visit, patient states they are participating from their home address. Patient and myself are on the telephone call. There is no referral for this visit. Some vital signs may be absent or patient reported.    Patient identification: identified by name, DOB, and current address   Cardiac Risk Factors include: advanced age (>29men, >71 women);hypertension;Other (see comment), Risk factor comments: hypercholesterolemia     Objective:     Vitals: BP 128/72   There is no height or weight on file to calculate BMI.  Advanced Directives 11/18/2018 08/20/2017 08/19/2016 03/08/2016 08/02/2015 05/28/2015 03/26/2014  Does Patient Have a Medical Advance Directive? No No No No;Yes No No No  Type of Advance Directive - - - Healthcare Power of Attorney - - -  Copy of Healthcare Power of Attorney in Chart? - - - - - - -  Would patient like information on creating a medical advance directive? No - Patient declined No - Patient declined - - No - patient declined information No - patient declined information No - patient declined information  Pre-existing out of facility DNR order (yellow form or pink MOST form) - - - - - - -    Tobacco Social History   Tobacco Use  Smoking Status Never Smoker  Smokeless Tobacco Never Used     Counseling given: Not Answered   Clinical Intake:  Pre-visit preparation completed: Yes  Pain : No/denies pain     Nutritional Risks: None Diabetes: No  How often do you need to have someone help you when you read instructions, pamphlets, or other written materials from your doctor or pharmacy?: 1 - Never What is the last  grade level you completed in school?: 12th  Interpreter Needed?: No  Information entered by :: CJohnson, LPN  Past Medical History:  Diagnosis Date  . Arthritis    "fingers" (2013-03-15)  . Basilar artery stenosis    90%  . Bladder spasms   . Blood transfusion 1960's   w/childbirth  . CAD (coronary artery disease)    a. 06/2009 cath: severe stenosis RCA with placement drug eluting stent;  b. 08/2011 cath: normal left main, luminal LAD irregularities, luminal LCx irregularities, luminal RCA irregularities and no ISR to mid-RCA DES, LVEF 60-65%; c. 02/2013 nonischemic CL, EF 79%.  . Family history of anesthesia complication    "first cousin had fever and almost died" (Mar 15, 2013)  . Fibromyalgia   . HLD (hyperlipidemia) 01/1997  . Hypertension   . Hypothyroidism 1990  . IC (interstitial cystitis)   . Labile hypertension 08/1991  . Left-sided carotid artery obstruction   . Malignant hyperthermia    "told to warn everybody before I had surgery that my 1st cousin had malignant hyperthermia; I've had symptoms of it too" (March 15, 2013)  . Obesity   . Pacemaker 11/11/2011   dual chamber pacemaker  . Pneumonia    "twice" (03/15/13)  . Second degree Mobitz II AV block   . TIA (transient ischemic attack)    multiple/notes 08/02/2015   Past Surgical History:  Procedure Laterality Date  . basilar artery angioplasty  09/04/08   sm right brachial hematoma  . Bilateral Vert & Subclavian  Angiogram  07/19/08   85-90% stenosis mid basilar art; occluded left vert artery; occluded left comm carotid artery w/ collat flow  . BLADDER SUSPENSION  1974  . CARDIAC CATHETERIZATION  1993   normal  . carotid arteriogram     bilat  . Carotid US  11/93   Occl L comm carotid  . Carotid US  4/98   L comm occl R-ok  . Carotid US  01/30/03   L occl  R less 40%  . CATARACT EXTRACTION W/ INTRAOCULAR LENS IMPLANT Bilateral 2007-2008  . Cath single vess dz  06/13/09   70% stenosis RCA EF 65-70%  . CHOLECYSTECTOMY   1985  . CORONARY ANGIOPLASTY WITH STENT PLACEMENT  06/19/09  . DEXA  10/15/04   Troch -0.4 o/w pos  . EMG  1/01   LE, RUE normal  . EYE SURGERY    . INSERT / REPLACE / REMOVE PACEMAKER  11/11/2011   MDT Adapta L implanted by Dr Johney Frame. [employer]  . LEFT HEART CATHETERIZATION WITH CORONARY ANGIOGRAM N/A 08/10/2011   Procedure: LEFT HEART CATHETERIZATION WITH CORONARY ANGIOGRAM;  Surgeon: Kathleene Hazel, MD;  Location[facility] C[facility] LAB;  Service: Cardiovascular;  Laterality: N/A;  . LEFT HEART CATHETERIZATION WITH CORONARY ANGIOGRAM N/A 07/28/2013   Procedure: LEFT HEART CATHETERIZATION WITH CORONARY ANGIOGRAM;  Surgeon: Kathleene Hazel, MD;  Location: [facility] CATH LAB;  Service: Cardiovascular;  Laterality: N/A;  . PERMANENT PACEMAKER INSERTION N/A 11/11/2011   Procedure: PERMANENT PACEMAKER INSERTION;  Surgeon: Hillis Range, MD;  Location: [facility] CATH LAB;  Service: Cardiovascular;  Laterality: N/A;  . RENAL ANGIOPLASTY  1993   normal  . US RENAL/AORTA  12/93   Normal  . VAGINAL HYSTERECTOMY  1974   dysmennorhea  . Visual evoked response  11/93; 1/01   normal   Family History  Problem Relation Age of Onset  . Lung cancer Father   . Heart attack Mother   . Lymphoma Mother        s/p chemo  . Stroke Mother        x 2  . Diabetes Brother   . Breast cancer Maternal Aunt   . Colon cancer Neg Hx    Social History   Socioeconomic History  . Marital status: Married    Spouse name: Not on file  . Number of children: 1  . Years of education: Not on file  . Highest education level: Not on file  Occupational History  . Occupation: Bookkeeper-part time  . Occupation: retired    Associate Professor: RETIRED  Social Needs  . Financial resource strain: Not hard at all  . Food insecurity    Worry: Never true    Inability: Never true  . Transportation needs    Medical: No    Non-medical: No  Tobacco Use  . Smoking status: Never Smoker  . Smokeless tobacco: Never Used  Substance and Sexual  Activity  . Alcohol use: No    Alcohol/week: 0.0 standard drinks  . Drug use: No  . Sexual activity: Not Currently    Birth control/protection: Post-menopausal  Lifestyle  . Physical activity    Days per week: 0 days    Minutes per session: 0 min  . Stress: Not at all  Relationships  . Social Musician on phone: Not on file    Gets together: Not on file    Attends religious service: Not on file    Active member of club or organization: Not on  file    Attends meetings of clubs or organizations: Not on file    Relationship status: Not on file  Other Topics Concern  . 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   Married 1962; 1 child   Drinks coffee and coke daily     Outpatient Encounter Medications as of 11/18/2018  Medication Sig  . acetaminophen (TYLENOL) 500 MG tablet Take 1,000 mg by mouth every 6 (six) hours as needed for moderate pain.   Marland Kitchen amLODipine (NORVASC) 5 MG tablet Take 1 tablet (5 mg total) by mouth 2 (two) times daily. Please keep upcoming appt in September with Dr. Angelena Form for future refills. Thank you  . aspirin EC 81 MG tablet Take 1 tablet (81 mg total) by mouth daily.  . Cholecalciferol (VITAMIN D) 2000 units CAPS Take 1 capsule (2,000 Units total) by mouth daily.  . clopidogrel (PLAVIX) 75 MG tablet TAKE 1 TABLET BY MOUTH EVERY DAY  . hydrochlorothiazide (HYDRODIURIL) 25 MG tablet TAKE 1 TABLET BY MOUTH EVERY DAY WITH LOSARTAN  . lansoprazole (PREVACID) 15 MG capsule Take 15 mg by mouth daily as needed (reflux).   Marland Kitchen levothyroxine (SYNTHROID) 100 MCG tablet Take 1 tablet (100 mcg total) by mouth daily.  Marland Kitchen loratadine (CLARITIN) 10 MG tablet Take 10 mg by mouth daily. As needed  . nitroGLYCERIN (NITROSTAT) 0.4 MG SL tablet Place 0.4 mg under the tongue every 5 (five) minutes as needed for chest pain (MAX 3 TABLETS).   Marland Kitchen olmesartan (BENICAR) 40 MG tablet Take 1 tablet (40 mg total) by  mouth daily.  . Red Yeast Rice 600 MG CAPS Take 600 mg by mouth See admin instructions. Takes 2 times daily, except mon and fri-takes 1 time daily only  . valACYclovir (VALTREX) 1000 MG tablet Take 2 tablets (2,000 mg total) by mouth 2 (two) times daily.   No facility-administered encounter medications on file as of 11/18/2018.     Activities of Daily Living In your present state of health, do you have any difficulty performing the following activities: 11/18/2018  Hearing? N  Vision? N  Difficulty concentrating or making decisions? N  Walking or climbing stairs? N  Dressing or bathing? N  Doing errands, shopping? N  Preparing Food and eating ? N  Using the Toilet? N  In the past six months, have you accidently leaked urine? N  Do you have problems with loss of bowel control? N  Managing your Medications? N  Managing your Finances? N  Housekeeping or managing your Housekeeping? N  Some recent data might be hidden    Patient Care Team: Tonia Ghent, MD as PCP - General (Family Medicine) Burnell Blanks, MD as PCP - Cardiology (Cardiology) Burnell Blanks, MD as Consulting Physician (Cardiology) Thompson Grayer, MD as Consulting Physician (Cardiology) Birder Robson, MD as Referring Physician (Ophthalmology)    Assessment:   This is a routine wellness examination for Kelsey Dunn.  Exercise Activities and Dietary recommendations Current Exercise Habits: Home exercise routine, Type of exercise: walking, Time (Minutes): 30, Frequency (Times/Week): 7, Weekly Exercise (Minutes/Week): 210, Intensity: Mild, Exercise limited by: None identified  Goals    . Increase physical activity     Starting 08/19/2016, I will continue to walk at least 30 min daily.     . Patient Stated     11/18/2018, I will maintain and continue medications as prescribed.        Fall Risk Fall  Risk  11/18/2018 08/20/2017 08/19/2016 05/28/2015 02/20/2013  Falls in the past year? 0 No No No No   Number falls in past yr: 0 - - - -  Injury with Fall? 0 - - - -  Risk for fall due to : Medication side effect - - - -  Follow up Falls evaluation completed;Falls prevention discussed - - - -   Is the patient's home free of loose throw rugs in walkways, pet beds, electrical cords, etc?   yes      Grab bars in the bathroom? yes      Handrails on the stairs?   yes      Adequate lighting?   yes  Timed Get Up and Go performed: N/A  Depression Screen PHQ 2/9 Scores 11/18/2018 08/20/2017 08/19/2016 05/28/2015  PHQ - 2 Score 0 0 1 0  PHQ- 9 Score 0 0 6 -     Cognitive Function MMSE - Mini Mental State Exam 11/18/2018 08/20/2017 08/19/2016 05/28/2015  Orientation to time 5 5 5 5   Orientation to Place 5 5 5 5   Registration 3 3 3 3   Attention/ Calculation 5 0 0 0  Recall 3 3 3 3   Language- name 2 objects - 0 0 0  Language- repeat 1 1 1 1   Language- follow 3 step command - 3 3 3   Language- read & follow direction - 0 0 0  Write a sentence - 0 0 0  Copy design - 0 0 0  Total score - 20 20 20   Mini Cog  Mini-Cog screen was completed. Maximum score is 22. A value of 0 denotes this part of the MMSE was not completed or the patient failed this part of the Mini-Cog screening.       Immunization History  Administered Date(s) Administered  . Fluad Quad(high Dose 65+) 10/11/2018  . Influenza Split 11/05/2010  . Influenza,inj,Quad PF,6+ Mos 10/13/2012, 10/12/2013, 10/12/2014, 10/10/2015, 10/22/2016, 09/23/2017  . Pneumococcal Conjugate-13 05/28/2015  . Pneumococcal Polysaccharide-23 09/05/2008  . Td 12/05/1997    Qualifies for Shingles Vaccine? Yes   Screening Tests Health Maintenance  Topic Date Due  . TETANUS/TDAP  11/18/2019 (Originally 12/06/2007)  . INFLUENZA VACCINE  Completed  . DEXA SCAN  Completed  . PNA vac Low Risk Adult  Completed    Cancer Screenings: Lung: Low Dose CT Chest recommended if Age 67-80 years, 30 pack-year currently smoking OR have quit w/in 15years.  Patient does not qualify. Breast:  Up to date on Mammogram? No, declined   Up to date of Bone Density/Dexa? Yes, completed 03/09/2013 Colorectal: declined  Additional Screenings:  Hepatitis C Screening: N/A     Plan:    Patient will maintain and continue medications as prescribed.    I have personally reviewed and noted the following in the patient's chart:   . Medical and social history . Use of alcohol, tobacco or illicit drugs  . Current medications and supplements . Functional ability and status . Nutritional status . Physical activity . Advanced directives . List of other physicians . Hospitalizations, surgeries, and ER visits in previous 12 months . Vitals . Screenings to include cognitive, depression, and falls . Referrals and appointments  In addition, I have reviewed and discussed with patient certain preventive protocols, quality metrics, and best practice recommendations. A written personalized care plan for preventive services as well as general preventive health recommendations were provided to patient.     Janalyn ShyJohnson, Yariana Hoaglund, LPN  [id number]/13/2020

## 2018-11-18 NOTE — Patient Instructions (Signed)
Kelsey Dunn , Thank you for taking time to come for your Medicare Wellness Visit. I appreciate your ongoing commitment to your health goals. Please review the following plan we discussed and let me know if I can assist you in the future.   Screening recommendations/referrals: Colonoscopy: decline Mammogram: decline Bone Density: Up to date, completed 03/09/2013 Recommended yearly ophthalmology/optometry visit for glaucoma screening and checkup Recommended yearly dental visit for hygiene and checkup  Vaccinations: Influenza vaccine: Up to date, completed 10/11/2018 Pneumococcal vaccine: Completed series Tdap vaccine: decline Shingles vaccine: decline    Advanced directives: Advance directive discussed with you today. Even though you declined this today please call our office should you change your mind and we can give you the proper paperwork for you to fill out.  Conditions/risks identified: hypertension, hypercholesterolemia  Next appointment: 11/25/2018 @ 2:15 pm    Preventive Care 65 Years and Older, Female Preventive care refers to lifestyle choices and visits with your health care provider that can promote health and wellness. What does preventive care include?  A yearly physical exam. This is also called an annual well check.  Dental exams once or twice a year.  Routine eye exams. Ask your health care provider how often you should have your eyes checked.  Personal lifestyle choices, including:  Daily care of your teeth and gums.  Regular physical activity.  Eating a healthy diet.  Avoiding tobacco and drug use.  Limiting alcohol use.  Practicing safe sex.  Taking low-dose aspirin every day.  Taking vitamin and mineral supplements as recommended by your health care provider. What happens during an annual well check? The services and screenings done by your health care provider during your annual well check will depend on your age, overall health, lifestyle risk  factors, and family history of disease. Counseling  Your health care provider may ask you questions about your:  Alcohol use.  Tobacco use.  Drug use.  Emotional well-being.  Home and relationship well-being.  Sexual activity.  Eating habits.  History of falls.  Memory and ability to understand (cognition).  Work and work Statistician.  Reproductive health. Screening  You may have the following tests or measurements:  Height, weight, and BMI.  Blood pressure.  Lipid and cholesterol levels. These may be checked every 5 years, or more frequently if you are over 46 years old.  Skin check.  Lung cancer screening. You may have this screening every year starting at age 25 if you have a 30-pack-year history of smoking and currently smoke or have quit within the past 15 years.  Fecal occult blood test (FOBT) of the stool. You may have this test every year starting at age 47.  Flexible sigmoidoscopy or colonoscopy. You may have a sigmoidoscopy every 5 years or a colonoscopy every 10 years starting at age 37.  Hepatitis C blood test.  Hepatitis B blood test.  Sexually transmitted disease (STD) testing.  Diabetes screening. This is done by checking your blood sugar (glucose) after you have not eaten for a while (fasting). You may have this done every 1-3 years.  Bone density scan. This is done to screen for osteoporosis. You may have this done starting at age 51.  Mammogram. This may be done every 1-2 years. Talk to your health care provider about how often you should have regular mammograms. Talk with your health care provider about your test results, treatment options, and if necessary, the need for more tests. Vaccines  Your health care provider may recommend  certain vaccines, such as:  Influenza vaccine. This is recommended every year.  Tetanus, diphtheria, and acellular pertussis (Tdap, Td) vaccine. You may need a Td booster every 10 years.  Zoster vaccine. You  may need this after age 19.  Pneumococcal 13-valent conjugate (PCV13) vaccine. One dose is recommended after age 54.  Pneumococcal polysaccharide (PPSV23) vaccine. One dose is recommended after age 9. Talk to your health care provider about which screenings and vaccines you need and how often you need them. This information is not intended to replace advice given to you by your health care provider. Make sure you discuss any questions you have with your health care provider. Document Released: 01/18/2015 Document Revised: 09/11/2015 Document Reviewed: 10/23/2014 Elsevier Interactive Patient Education  2017 [place] Prevention in the Home Falls can cause injuries. They can happen to people of all ages. There are many things you can do to make your home safe and to help prevent falls. What can I do on the outside of my home?  Regularly fix the edges of walkways and driveways and fix any cracks.  Remove anything that might make you trip as you walk through a door, such as a raised step or threshold.  Trim any bushes or trees on the path to your home.  Use bright outdoor lighting.  Clear any walking paths of anything that might make someone trip, such as rocks or tools.  Regularly check to see if handrails are loose or broken. Make sure that both sides of any steps have handrails.  Any raised decks and porches should have guardrails on the edges.  Have any leaves, snow, or ice cleared regularly.  Use sand or salt on walking paths during winter.  Clean up any spills in your garage right away. This includes oil or grease spills. What can I do in the bathroom?  Use night lights.  Install grab bars by the toilet and in the tub and shower. Do not use towel bars as grab bars.  Use non-skid mats or decals in the tub or shower.  If you need to sit down in the shower, use a plastic, non-slip stool.  Keep the floor dry. Clean up any water that spills on the floor as soon as  it happens.  Remove soap buildup in the tub or shower regularly.  Attach bath mats securely with double-sided non-slip rug tape.  Do not have throw rugs and other things on the floor that can make you trip. What can I do in the bedroom?  Use night lights.  Make sure that you have a light by your bed that is easy to reach.  Do not use any sheets or blankets that are too big for your bed. They should not hang down onto the floor.  Have a firm chair that has side arms. You can use this for support while you get dressed.  Do not have throw rugs and other things on the floor that can make you trip. What can I do in the kitchen?  Clean up any spills right away.  Avoid walking on wet floors.  Keep items that you use a lot in easy-to-reach places.  If you need to reach something above you, use a strong step stool that has a grab bar.  Keep electrical cords out of the way.  Do not use floor polish or wax that makes floors slippery. If you must use wax, use non-skid floor wax.  Do not have throw rugs and other  things on the floor that can make you trip. What can I do with my stairs?  Do not leave any items on the stairs.  Make sure that there are handrails on both sides of the stairs and use them. Fix handrails that are broken or loose. Make sure that handrails are as long as the stairways.  Check any carpeting to make sure that it is firmly attached to the stairs. Fix any carpet that is loose or worn.  Avoid having throw rugs at the top or bottom of the stairs. If you do have throw rugs, attach them to the floor with carpet tape.  Make sure that you have a light switch at the top of the stairs and the bottom of the stairs. If you do not have them, ask someone to add them for you. What else can I do to help prevent falls?  Wear shoes that:  Do not have high heels.  Have rubber bottoms.  Are comfortable and fit you well.  Are closed at the toe. Do not wear sandals.  If  you use a stepladder:  Make sure that it is fully opened. Do not climb a closed stepladder.  Make sure that both sides of the stepladder are locked into place.  Ask someone to hold it for you, if possible.  Clearly mark and make sure that you can see:  Any grab bars or handrails.  First and last steps.  Where the edge of each step is.  Use tools that help you move around (mobility aids) if they are needed. These include:  Canes.  Walkers.  Scooters.  Crutches.  Turn on the lights when you go into a dark area. Replace any light bulbs as soon as they burn out.  Set up your furniture so you have a clear path. Avoid moving your furniture around.  If any of your floors are uneven, fix them.  If there are any pets around you, be aware of where they are.  Review your medicines with your doctor. Some medicines can make you feel dizzy. This can increase your chance of falling. Ask your doctor what other things that you can do to help prevent falls. This information is not intended to replace advice given to you by your health care provider. Make sure you discuss any questions you have with your health care provider. Document Released: 10/18/2008 Document Revised: 05/30/2015 Document Reviewed: 01/26/2014 Elsevier Interactive Patient Education  2017 [employer].

## 2018-11-25 ENCOUNTER — Ambulatory Visit (INDEPENDENT_AMBULATORY_CARE_PROVIDER_SITE_OTHER): Payer: Medicare HMO | Admitting: Family Medicine

## 2018-11-25 DIAGNOSIS — I10 Essential (primary) hypertension: Secondary | ICD-10-CM

## 2018-11-25 DIAGNOSIS — E785 Hyperlipidemia, unspecified: Secondary | ICD-10-CM | POA: Diagnosis not present

## 2018-11-25 DIAGNOSIS — E039 Hypothyroidism, unspecified: Secondary | ICD-10-CM

## 2018-11-25 DIAGNOSIS — R12 Heartburn: Secondary | ICD-10-CM | POA: Diagnosis not present

## 2018-11-25 DIAGNOSIS — Z7189 Other specified counseling: Secondary | ICD-10-CM

## 2018-11-25 DIAGNOSIS — R739 Hyperglycemia, unspecified: Secondary | ICD-10-CM

## 2018-11-25 DIAGNOSIS — M8580 Other specified disorders of bone density and structure, unspecified site: Secondary | ICD-10-CM

## 2018-11-25 DIAGNOSIS — Z0000 Encounter for general adult medical examination without abnormal findings: Secondary | ICD-10-CM

## 2018-11-25 MED ORDER — LEVOTHYROXINE SODIUM 100 MCG PO TABS: 100.0000 ug | ORAL_TABLET | Freq: Every day | ORAL | 3 refills | Status: DC

## 2018-11-25 MED ORDER — OLMESARTAN MEDOXOMIL 40 MG PO TABS: 40.0000 mg | ORAL_TABLET | Freq: Every day | ORAL | 3 refills | Status: DC

## 2018-11-25 MED ORDER — HYDROCHLOROTHIAZIDE 25 MG PO TABS: ORAL_TABLET | ORAL | 3 refills | Status: DC

## 2018-11-25 MED ORDER — AMLODIPINE BESYLATE 5 MG PO TABS: 5.0000 mg | ORAL_TABLET | Freq: Two times a day (BID) | ORAL | 3 refills | Status: DC

## 2018-11-25 MED ORDER — CLOPIDOGREL BISULFATE 75 MG PO TABS: 75.0000 mg | ORAL_TABLET | Freq: Every day | ORAL | 3 refills | Status: DC

## 2018-11-25 NOTE — Progress Notes (Signed)
Interactive audio and video telecommunications were attempted between this provider and patient, however failed, due to patient having technical difficulties OR patient did not have access to video capability.  We continued and completed visit with audio only.   Virtual Visit via Telephone Note  I connected with patient on 11/25/18  at 2:19 PM  by telephone and verified that I am speaking with the correct person using two identifiers.  Location of patient: home  Location of MD: [place] [facility] Name of referring provider (if blank then none associated): Names per persons and role in encounter:  MD: Ferd Hibbs, Patient: name listed above.    I discussed the limitations, risks, security and privacy concerns of performing an evaluation and management service by telephone and the availability of in person appointments. I also discussed with the patient that there may be a patient responsible charge related to this service. The patient expressed understanding and agreed to proceed.  CC f/u.   History of Present Illness:   We talked about covid considerations.  Discussed with her about her caregiver strain.  Her husband is on hospice due to lung disease and he is progressively deconditioned.  She wanted to defer some of the items discussed today due to his care needs.  Flu vaccine- 2020 Tetanus- d/w pt.   Shingrix d/w pt.   PNA up to date.  Declined colonoscopy in 2020.  D/w pt.   Mammogram- dw pt, encouraged. She declined at this point. She does manual checks.  DXA d/w pt.  She'll consider.  Defer for now per patient request.   Advance directive- husband or son equally designated ifpatient wereincapacitated. D/w pt.  Hypertension:    Using medication without problems or lightheadedness: yes Chest pain with exertion: no Edema:no Short of breath:no Weight 158 on her home scales, down from prev. Intentional weight loss, esp considering caring for her husband.  Discussed if  lightheaded, then we may need to taper her meds.  She'll update me as needed.    Hypothyroidism.   Still a day.  Compliant.  Due for f/u labs when possible, d/w pt.    Elevated Cholesterol: She is off RYR in the meantime.   Muscle aches: no Diet compliance: yes Exercise:yes, as allowed with caring for her husband Statin intolerant.   GERD controlled with rare use of PPI. "I don't remember the last time I took prevacid."   Observations/Objective: nad Speech wnl  Assessment and Plan:  We talked about covid considerations.  Discussed with her about her caregiver strain.  Her husband is on hospice due to lung disease and he is progressively deconditioned.  She wanted to defer some of the items discussed today due to his care needs.  Support offered.  She deserves a lot of credit for looking after her husband.  I thanked her for her effort.  Flu vaccine- 2020 Tetanus- d/w pt.   Shingrix d/w pt.   PNA up to date.  Declined colonoscopy in 2020.  D/w pt.   Mammogram- dw pt, encouraged. She declined at this point. She does manual checks.  DXA d/w pt.  She'll consider.  Defer for now per patient request.   Advance directive- husband or son equally designated ifpatient wereincapacitated. D/w pt.  Hypertension:    No change in meds at this point. Weight 158 on her home scales, down from prev. Intentional weight loss, esp considering caring for her husband.  Discussed if lightheaded, then we may need to taper her meds.  She'll update me as needed.    Hypothyroidism.   Still 128mcg a day.  Compliant.  Due for f/u labs when possible, d/w pt. continue as is.  Elevated Cholesterol: Continue as is. Statin intolerant.  We will set follow-up labs when possible.   GERD controlled with rare use of PPI. "I don't remember the last time I took prevacid."   Follow Up Instructions:see above.    I discussed the assessment and treatment plan with the patient. The patient was  provided an opportunity to ask questions and all were answered. The patient agreed with the plan and demonstrated an understanding of the instructions.   The patient was advised to call back or seek an in-person evaluation if the symptoms worsen or if the condition fails to improve as anticipated.  I provided 31 minutes of non-face-to-face time during this encounter.  Elsie Stain, MD

## 2018-11-28 NOTE — Assessment & Plan Note (Signed)
Still 125mcg a day.  Compliant.  Due for f/u labs when possible, d/w pt. continue as is.

## 2018-11-28 NOTE — Assessment & Plan Note (Signed)
Continue as is. Statin intolerant.  We will set follow-up labs when possible.

## 2018-11-28 NOTE — Assessment & Plan Note (Signed)
Advance directive- husband or son equally designated ifpatient wereincapacitated. D/w pt.

## 2018-11-28 NOTE — Assessment & Plan Note (Signed)
We talked about covid considerations.  Discussed with her about her caregiver strain.  Her husband is on hospice due to lung disease and he is progressively deconditioned.  She wanted to defer some of the items discussed today due to his care needs.  Flu vaccine- 2020 Tetanus- d/w pt.   Shingrix d/w pt.   PNA up to date.  Declined colonoscopy in 2020.  D/w pt.   Mammogram- dw pt, encouraged. She declined at this point. She does manual checks.  DXA d/w pt.  She'll consider.  Defer for now per patient request.   Advance directive- husband or son equally designated ifpatient wereincapacitated. D/w pt.

## 2018-11-28 NOTE — Assessment & Plan Note (Signed)
No change in meds at this point. Weight 158 on her home scales, down from prev. Intentional weight loss, esp considering caring for her husband.  Discussed if lightheaded, then we may need to taper her meds.  She'll update me as needed.

## 2018-11-28 NOTE — Assessment & Plan Note (Signed)
GERD controlled with rare use of PPI. "I don't remember the last time I took prevacid."

## 2018-12-08 ENCOUNTER — Other Ambulatory Visit: Payer: Medicare HMO

## 2018-12-09 ENCOUNTER — Other Ambulatory Visit: Payer: Medicare HMO

## 2018-12-15 ENCOUNTER — Other Ambulatory Visit (INDEPENDENT_AMBULATORY_CARE_PROVIDER_SITE_OTHER): Payer: Medicare HMO

## 2018-12-15 ENCOUNTER — Other Ambulatory Visit: Payer: Self-pay

## 2018-12-15 DIAGNOSIS — E039 Hypothyroidism, unspecified: Secondary | ICD-10-CM

## 2018-12-15 DIAGNOSIS — M8580 Other specified disorders of bone density and structure, unspecified site: Secondary | ICD-10-CM | POA: Diagnosis not present

## 2018-12-15 DIAGNOSIS — R739 Hyperglycemia, unspecified: Secondary | ICD-10-CM

## 2018-12-15 DIAGNOSIS — I10 Essential (primary) hypertension: Secondary | ICD-10-CM | POA: Diagnosis not present

## 2018-12-15 LAB — COMPREHENSIVE METABOLIC PANEL
ALT: 13 U/L (ref 0–35)
AST: 17 U/L (ref 0–37)
Albumin: 4.2 g/dL (ref 3.5–5.2)
Alkaline Phosphatase: 53 U/L (ref 39–117)
BUN: 19 mg/dL (ref 6–23)
CO2: 28 mEq/L (ref 19–32)
Calcium: 10.1 mg/dL (ref 8.4–10.5)
Chloride: 101 mEq/L (ref 96–112)
Creatinine, Ser: 0.96 mg/dL (ref 0.40–1.20)
GFR: 56.49 mL/min — ABNORMAL LOW (ref >60.00)
Glucose, Bld: 104 mg/dL — ABNORMAL HIGH (ref 70–99)
Potassium: 3.7 mEq/L (ref 3.5–5.1)
Sodium: 138 mEq/L (ref 135–145)
Total Bilirubin: 0.7 mg/dL (ref 0.2–1.2)
Total Protein: 7 g/dL (ref 6.0–8.3)

## 2018-12-15 LAB — LIPID PANEL
Cholesterol: 192 mg/dL (ref 0–200)
HDL: 44.4 mg/dL (ref >39.00)
LDL Cholesterol: 108 mg/dL — ABNORMAL HIGH (ref 0–99)
NonHDL: 147.24
Total CHOL/HDL Ratio: 4
Triglycerides: 194 mg/dL — ABNORMAL HIGH (ref 0.0–149.0)
VLDL: 38.8 mg/dL (ref 0.0–40.0)

## 2018-12-15 LAB — VITAMIN D 25 HYDROXY (VIT D DEFICIENCY, FRACTURES): VITD: 27.16 ng/mL — ABNORMAL LOW (ref 30.00–100.00)

## 2018-12-15 LAB — HEMOGLOBIN A1C: Hgb A1c MFr Bld: 5.3 % (ref 4.6–6.5)

## 2018-12-15 LAB — TSH: TSH: 0.58 u[IU]/mL (ref 0.35–4.50)

## 2019-01-18 ENCOUNTER — Ambulatory Visit (INDEPENDENT_AMBULATORY_CARE_PROVIDER_SITE_OTHER): Payer: Medicare HMO | Admitting: *Deleted

## 2019-01-18 DIAGNOSIS — I441 Atrioventricular block, second degree: Secondary | ICD-10-CM

## 2019-01-18 LAB — CUP PACEART REMOTE DEVICE CHECK
Battery Impedance: 727 Ohm
Battery Remaining Longevity: 69 mo
Battery Voltage: 2.78 V
Brady Statistic AP VP Percent: 37 %
Brady Statistic AP VS Percent: 0 %
Brady Statistic AS VP Percent: 63 %
Brady Statistic AS VS Percent: 0 %
Date Time Interrogation Session: 20210113085751
Implantable Lead Implant Date: 20131106
Implantable Lead Implant Date: 20131106
Implantable Lead Location: 753859
Implantable Lead Location: 753860
Implantable Lead Model: 5076
Implantable Lead Model: 5092
Implantable Pulse Generator Implant Date: 20131106
Lead Channel Impedance Value: 429 Ohm
Lead Channel Impedance Value: 580 Ohm
Lead Channel Pacing Threshold Amplitude: 0.5 V
Lead Channel Pacing Threshold Amplitude: 0.875 V
Lead Channel Pacing Threshold Pulse Width: 0.4 ms
Lead Channel Pacing Threshold Pulse Width: 0.4 ms
Lead Channel Setting Pacing Amplitude: 2 V
Lead Channel Setting Pacing Amplitude: 2.5 V
Lead Channel Setting Pacing Pulse Width: 0.4 ms
Lead Channel Setting Sensing Sensitivity: 2 mV

## 2019-01-31 ENCOUNTER — Other Ambulatory Visit (HOSPITAL_COMMUNITY): Payer: Medicare HMO

## 2019-02-21 ENCOUNTER — Other Ambulatory Visit (HOSPITAL_COMMUNITY): Payer: Self-pay | Admitting: Interventional Radiology

## 2019-02-21 DIAGNOSIS — I771 Stricture of artery: Secondary | ICD-10-CM

## 2019-03-07 ENCOUNTER — Other Ambulatory Visit: Payer: Self-pay

## 2019-03-07 ENCOUNTER — Ambulatory Visit (HOSPITAL_COMMUNITY)
Admission: RE | Admit: 2019-03-07 | Discharge: 2019-03-07 | Disposition: A | Discharge status: Home / Self Care | Payer: Medicare HMO | Source: Ambulatory Visit | Attending: Interventional Radiology | Admitting: Interventional Radiology

## 2019-03-07 DIAGNOSIS — I6523 Occlusion and stenosis of bilateral carotid arteries: Secondary | ICD-10-CM | POA: Diagnosis not present

## 2019-03-07 DIAGNOSIS — I6621 Occlusion and stenosis of right posterior cerebral artery: Secondary | ICD-10-CM | POA: Diagnosis not present

## 2019-03-07 DIAGNOSIS — I771 Stricture of artery: Secondary | ICD-10-CM | POA: Insufficient documentation

## 2019-03-07 LAB — POCT I-STAT CREATININE: Creatinine, Ser: 1.2 mg/dL — ABNORMAL HIGH (ref 0.44–1.00)

## 2019-03-07 MED ORDER — IOHEXOL 350 MG/ML SOLN
80.0000 mL | Freq: Once | INTRAVENOUS | Status: AC | PRN
  Administered 2019-03-07: 80 mL via INTRAVENOUS

## 2019-03-10 ENCOUNTER — Telehealth (HOSPITAL_COMMUNITY): Payer: Self-pay | Admitting: Radiology

## 2019-03-10 NOTE — Telephone Encounter (Signed)
Called pt, told her per Dr. Corliss Skains that she would be due for another CTA head/neck in 1 year's time. Pt agrees with this plan of care. JM

## 2019-04-17 ENCOUNTER — Encounter: Payer: Self-pay | Admitting: Family Medicine

## 2019-04-19 ENCOUNTER — Ambulatory Visit (INDEPENDENT_AMBULATORY_CARE_PROVIDER_SITE_OTHER): Payer: Medicare HMO | Admitting: *Deleted

## 2019-04-19 DIAGNOSIS — I441 Atrioventricular block, second degree: Secondary | ICD-10-CM

## 2019-04-19 LAB — CUP PACEART REMOTE DEVICE CHECK
Battery Impedance: 803 Ohm
Battery Remaining Longevity: 64 mo
Battery Voltage: 2.77 V
Brady Statistic AP VP Percent: 37 %
Brady Statistic AP VS Percent: 0 %
Brady Statistic AS VP Percent: 63 %
Brady Statistic AS VS Percent: 0 %
Date Time Interrogation Session: 20210414082446
Implantable Lead Implant Date: 20131106
Implantable Lead Implant Date: 20131106
Implantable Lead Location: 753859
Implantable Lead Location: 753860
Implantable Lead Model: 5076
Implantable Lead Model: 5092
Implantable Pulse Generator Implant Date: 20131106
Lead Channel Impedance Value: 412 Ohm
Lead Channel Impedance Value: 520 Ohm
Lead Channel Pacing Threshold Amplitude: 0.5 V
Lead Channel Pacing Threshold Amplitude: 1 V
Lead Channel Pacing Threshold Pulse Width: 0.4 ms
Lead Channel Pacing Threshold Pulse Width: 0.4 ms
Lead Channel Setting Pacing Amplitude: 2 V
Lead Channel Setting Pacing Amplitude: 2.5 V
Lead Channel Setting Pacing Pulse Width: 0.4 ms
Lead Channel Setting Sensing Sensitivity: 2 mV

## 2019-04-19 NOTE — Progress Notes (Signed)
PPM Remote  

## 2019-04-20 ENCOUNTER — Other Ambulatory Visit: Payer: Self-pay

## 2019-04-20 ENCOUNTER — Ambulatory Visit (INDEPENDENT_AMBULATORY_CARE_PROVIDER_SITE_OTHER): Payer: Medicare HMO | Admitting: Family Medicine

## 2019-04-20 ENCOUNTER — Encounter: Payer: Self-pay | Admitting: Family Medicine

## 2019-04-20 VITALS — BP 146/70 | HR 62 | Temp 97.3°F | Ht 59.5 in | Wt 160.1 lb

## 2019-04-20 DIAGNOSIS — R55 Syncope and collapse: Secondary | ICD-10-CM | POA: Diagnosis not present

## 2019-04-20 LAB — COMPREHENSIVE METABOLIC PANEL
ALT: 15 U/L (ref 0–35)
AST: 18 U/L (ref 0–37)
Albumin: 4.1 g/dL (ref 3.5–5.2)
Alkaline Phosphatase: 55 U/L (ref 39–117)
BUN: 23 mg/dL (ref 6–23)
CO2: 32 mEq/L (ref 19–32)
Calcium: 10 mg/dL (ref 8.4–10.5)
Chloride: 103 mEq/L (ref 96–112)
Creatinine, Ser: 1.15 mg/dL (ref 0.40–1.20)
GFR: 45.82 mL/min — ABNORMAL LOW (ref >60.00)
Glucose, Bld: 100 mg/dL — ABNORMAL HIGH (ref 70–99)
Potassium: 3.9 mEq/L (ref 3.5–5.1)
Sodium: 139 mEq/L (ref 135–145)
Total Bilirubin: 0.3 mg/dL (ref 0.2–1.2)
Total Protein: 6.7 g/dL (ref 6.0–8.3)

## 2019-04-20 LAB — CBC WITH DIFFERENTIAL/PLATELET
Basophils Absolute: 0.1 10*3/uL (ref 0.0–0.1)
Basophils Relative: 0.7 % (ref 0.0–3.0)
Eosinophils Absolute: 0.3 10*3/uL (ref 0.0–0.7)
Eosinophils Relative: 2.8 % (ref 0.0–5.0)
HCT: 43.3 % (ref 36.0–46.0)
Hemoglobin: 14.5 g/dL (ref 12.0–15.0)
Lymphocytes Relative: 25.6 % (ref 12.0–46.0)
Lymphs Abs: 2.3 10*3/uL (ref 0.7–4.0)
MCHC: 33.4 g/dL (ref 30.0–36.0)
MCV: 90 fl (ref 78.0–100.0)
Monocytes Absolute: 0.7 10*3/uL (ref 0.1–1.0)
Monocytes Relative: 8 % (ref 3.0–12.0)
Neutro Abs: 5.6 10*3/uL (ref 1.4–7.7)
Neutrophils Relative %: 62.9 % (ref 43.0–77.0)
Platelets: 239 10*3/uL (ref 150.0–400.0)
RBC: 4.81 Mil/uL (ref 3.87–5.11)
RDW: 12.8 % (ref 11.5–15.5)
WBC: 9 10*3/uL (ref 4.0–10.5)

## 2019-04-20 LAB — TSH: TSH: 0.48 u[IU]/mL (ref 0.35–4.50)

## 2019-04-20 NOTE — Patient Instructions (Addendum)
We'll update you about the labs.  I'll update cardiology.   If lightheaded at all, then skip amlodipine.  Take care.  Glad to see you.

## 2019-04-20 NOTE — Progress Notes (Signed)
This visit occurred during the SARS-CoV-2 public health emergency.  Safety protocols were in place, including screening questions prior to the visit, additional usage of staff PPE, and extensive cleaning of exam room while observing appropriate contact time as indicated for disinfecting solutions.  She fell last night.  Diffusely sore.  She thought she nearly passed out and went down.  "Everybody was fussing out me about not eating.  I've been waiting on Dean day and night."  Witnessed event.  No SZ activity.  Patient had turned around to close the fridge door, then she felt prodrome of sx and "I knew I was going to faint."  This was after prolonged fasting.  Her diet has been off, given her other concerns.  She isn't lightheaded.  No CP, SOB.  No FCNVD.    She heard others talking and regained her composure.  She declined ER eval.  She bumped her chin but then fell on a soft rubber mat on the kitchen floor.  She has mild diffuse soreness, "no one place hurts more than anywhere else."  Dec aches with tylenol.   She has felt like her ears were congested with fluid behind the TMs, but sx seems to be resolved today.    Condolences offered about her husband's death.  He was on hospice and she has been caring for him around the clock for an extended period of time.  We discussed the strain on her as a prolonged caregiver.  She deserves a lot of credit for her effort.  She had pacer remote done yesterday, prior to event.  No notification in the meantime re: device.    She felt well enough to do laundry and take out the trash today.  Meds, vitals, and allergies reviewed.   ROS: Per HPI unless specifically indicated in ROS section   GEN: nad, alert and oriented HEENT: ncat except for small amount of bruising on the right side of the jaw, near the midline.  No oral lesions noted.  No TMJ pain. NECK: supple w/o LA, not tender. CV: rrr.  PULM: ctab, no inc wob ABD: soft, +bs EXT: no edema SKIN: no  acute rash Spine nontender. Moving extremities x4.  Gait normal.  Speech normal.

## 2019-04-23 DIAGNOSIS — R55 Syncope and collapse: Secondary | ICD-10-CM | POA: Insufficient documentation

## 2019-04-23 NOTE — Assessment & Plan Note (Signed)
She had prodrome of symptoms after prolonged fasting.  It is not clear based on history that she completely passed out.  EKG done today with pacer spikes without any other abnormality noted.  She does not have any interval report from cardiology of alarming findings based on her device record.  It is noted about her prolonged chronic stressor with caring for her husband and his recent death, combined with relatively prolonged fasting prior to the event.  I think she likely was either transiently hypotensive and/or hypoglycemic and that caused the event.  She does not have any typical seizure activity and at this point is still okay for outpatient follow-up.  Discussed with patient about options.  Reasonable to check routine labs today.  See notes on labs.  She is not lightheaded now but if she is lightheaded at all then I want her to skip her amlodipine.  She agrees to plan. At least 30 minutes were devoted to patient care in this encounter (this can potentially include time spent reviewing the patient's file/history, interviewing and examining the patient, counseling/reviewing plan with patient, ordering referrals, ordering tests, reviewing relevant laboratory or x-ray data, and documenting the encounter).

## 2019-05-16 ENCOUNTER — Ambulatory Visit (HOSPITAL_COMMUNITY): Payer: Medicare HMO | Attending: Cardiovascular Disease

## 2019-05-16 ENCOUNTER — Other Ambulatory Visit: Payer: Self-pay

## 2019-05-16 DIAGNOSIS — I350 Nonrheumatic aortic (valve) stenosis: Secondary | ICD-10-CM | POA: Insufficient documentation

## 2019-05-17 ENCOUNTER — Other Ambulatory Visit: Payer: Self-pay | Admitting: *Deleted

## 2019-05-17 DIAGNOSIS — H43813 Vitreous degeneration, bilateral: Secondary | ICD-10-CM | POA: Diagnosis not present

## 2019-05-17 DIAGNOSIS — I350 Nonrheumatic aortic (valve) stenosis: Secondary | ICD-10-CM

## 2019-05-17 DIAGNOSIS — I340 Nonrheumatic mitral (valve) insufficiency: Secondary | ICD-10-CM

## 2019-06-15 ENCOUNTER — Encounter: Payer: Self-pay | Admitting: Family Medicine

## 2019-06-15 ENCOUNTER — Other Ambulatory Visit: Payer: Self-pay

## 2019-06-15 ENCOUNTER — Ambulatory Visit (INDEPENDENT_AMBULATORY_CARE_PROVIDER_SITE_OTHER): Payer: Medicare HMO | Admitting: Family Medicine

## 2019-06-15 DIAGNOSIS — F4321 Adjustment disorder with depressed mood: Secondary | ICD-10-CM | POA: Diagnosis not present

## 2019-06-15 DIAGNOSIS — I10 Essential (primary) hypertension: Secondary | ICD-10-CM | POA: Diagnosis not present

## 2019-06-15 DIAGNOSIS — H6980 Other specified disorders of Eustachian tube, unspecified ear: Secondary | ICD-10-CM | POA: Diagnosis not present

## 2019-06-15 DIAGNOSIS — M79643 Pain in unspecified hand: Secondary | ICD-10-CM

## 2019-06-15 DIAGNOSIS — H6990 Unspecified Eustachian tube disorder, unspecified ear: Secondary | ICD-10-CM

## 2019-06-15 NOTE — Progress Notes (Signed)
This visit occurred during the SARS-CoV-2 public health emergency.  Safety protocols were in place, including screening questions prior to the visit, additional usage of staff PPE, and extensive cleaning of exam room while observing appropriate contact time as indicated for disinfecting solutions.  R ear pressure.  Noted more at night.  No L ear sx.  Feels like water in the R ear canal.  Hearing affected on the R side episodically.    She has had variable BP readings.  No fevers.  BP was 111/63 this AM.  She didn't feel well at the time, she felt lightheaded.  She felt better after sitting for a little while this AM.  Higher BP today at OV.  This isn't the first time this happened with transiently lower BP.  She had echo done recently, with aortic stenosis slightly worse but still only in a medium/moderate range. Mild MR.  No CP.  No rooming spinning.  No syncope.    She has noted more trouble writing with joint pain in the PIP and DIPs on the R hand.    She isn't sleeping well and she is trying to adjust to the changes after her husband died, d/w pt.  Her meals are irregular.    Meds, vitals, and allergies reviewed.   ROS: Per HPI unless specifically indicated in ROS section   nad ncat Normal R ET function on exam today.   TM wnl B, no erythema Neck supple, no LA Soft murmur noted, RRR.   ctab abd soft, not ttp.  She has chronic PIP and DIP changes on the hand bilaterally without erythema.

## 2019-06-15 NOTE — Patient Instructions (Addendum)
If you have ear trouble, gently try to pop your ears and use flonase 2 sprays per nostril per day if needed.    Keep drinking enough water to keep your urine clear and try to schedule your meals or snacks.    If you have more episodes of lightheadedness, then cut back to 1 amlodipine a day and update me as needed.    Take care.  Glad to see you.

## 2019-06-18 DIAGNOSIS — F4321 Adjustment disorder with depressed mood: Secondary | ICD-10-CM | POA: Insufficient documentation

## 2019-06-18 DIAGNOSIS — M79643 Pain in unspecified hand: Secondary | ICD-10-CM | POA: Insufficient documentation

## 2019-06-18 NOTE — Assessment & Plan Note (Signed)
Discussed options, she can try diclofenac gel if needed for PIP and DIP pain.   She can update me as needed.

## 2019-06-18 NOTE — Assessment & Plan Note (Signed)
Normal tympanic membrane movement at time of exam but she still could have intermittent symptoms.  Discussed gently performing Valsalva and using Flonase as needed.  Update me as needed.

## 2019-06-18 NOTE — Assessment & Plan Note (Signed)
Potentially overtreated, routine cautions given to patient.  I want her to make sure she is eating and drinking regularly.  If she has recurrent troubles then I would cut her amlodipine back to 5 mg once a day and she can update me.  She agrees with plan.

## 2019-06-18 NOTE — Assessment & Plan Note (Signed)
She is trying to adjust all the changes in her life and she will update me as needed.  Still okay for outpatient follow-up.  Support offered.

## 2019-07-19 ENCOUNTER — Ambulatory Visit (INDEPENDENT_AMBULATORY_CARE_PROVIDER_SITE_OTHER): Payer: Medicare HMO | Admitting: *Deleted

## 2019-07-19 DIAGNOSIS — I441 Atrioventricular block, second degree: Secondary | ICD-10-CM

## 2019-07-19 LAB — CUP PACEART REMOTE DEVICE CHECK
Battery Impedance: 933 Ohm
Battery Remaining Longevity: 59 mo
Battery Voltage: 2.77 V
Brady Statistic AP VP Percent: 38 %
Brady Statistic AP VS Percent: 0 %
Brady Statistic AS VP Percent: 62 %
Brady Statistic AS VS Percent: 0 %
Date Time Interrogation Session: 20210714075533
Implantable Lead Implant Date: 20131106
Implantable Lead Implant Date: 20131106
Implantable Lead Location: 753859
Implantable Lead Location: 753860
Implantable Lead Model: 5076
Implantable Lead Model: 5092
Implantable Pulse Generator Implant Date: 20131106
Lead Channel Impedance Value: 418 Ohm
Lead Channel Impedance Value: 538 Ohm
Lead Channel Pacing Threshold Amplitude: 0.5 V
Lead Channel Pacing Threshold Amplitude: 1.25 V
Lead Channel Pacing Threshold Pulse Width: 0.4 ms
Lead Channel Pacing Threshold Pulse Width: 0.4 ms
Lead Channel Setting Pacing Amplitude: 2 V
Lead Channel Setting Pacing Amplitude: 2.5 V
Lead Channel Setting Pacing Pulse Width: 0.4 ms
Lead Channel Setting Sensing Sensitivity: 2 mV

## 2019-07-20 NOTE — Progress Notes (Signed)
Remote pacemaker transmission.   

## 2019-07-28 ENCOUNTER — Telehealth: Payer: Self-pay | Admitting: Cardiovascular Disease

## 2019-07-28 NOTE — Telephone Encounter (Signed)
Kelsey Dunn is calling requesting to speak with Dr. Clifton James in regards to if she should get the Covid Vaccine as well as if she can get a sooner appointment to be seen by him. Please advise.

## 2019-07-28 NOTE — Telephone Encounter (Signed)
I spoke with Kelsey Dunn 20 min on the phone. She wants Dr. Clifton James to know that she is torn about whether to get Covid Vaccine.  She is probably going to get it.  She also wanted to know if her appointment should be moved up with him.  She was last seen in Sept 2020.  She is feeling ok with no new problems or concerns.  Her husband, also Dr. Gibson Ramp patient died in 2022-05-12 after a long illness.   She is going to check with her other doctors as to what they recommended.  She was worried about her aortic valve and the vaccine. She is aware [facility] is recommending everyone receive the vaccine and will think a little more about it and probably get it.

## 2019-09-22 ENCOUNTER — Other Ambulatory Visit: Payer: Self-pay

## 2019-09-22 ENCOUNTER — Ambulatory Visit: Payer: Medicare HMO | Admitting: Cardiovascular Disease

## 2019-09-22 ENCOUNTER — Encounter: Payer: Self-pay | Admitting: Cardiovascular Disease

## 2019-09-22 VITALS — BP 122/70 | HR 67 | Ht 59.5 in | Wt 148.0 lb

## 2019-09-22 DIAGNOSIS — I10 Essential (primary) hypertension: Secondary | ICD-10-CM

## 2019-09-22 DIAGNOSIS — I2510 Atherosclerotic heart disease of native coronary artery without angina pectoris: Secondary | ICD-10-CM

## 2019-09-22 DIAGNOSIS — I350 Nonrheumatic aortic (valve) stenosis: Secondary | ICD-10-CM | POA: Diagnosis not present

## 2019-09-22 DIAGNOSIS — I5032 Chronic diastolic (congestive) heart failure: Secondary | ICD-10-CM | POA: Diagnosis not present

## 2019-09-22 DIAGNOSIS — I441 Atrioventricular block, second degree: Secondary | ICD-10-CM

## 2019-09-22 DIAGNOSIS — I340 Nonrheumatic mitral (valve) insufficiency: Secondary | ICD-10-CM | POA: Diagnosis not present

## 2019-09-22 NOTE — Progress Notes (Signed)
Chief Complaint  Patient presents with  . Follow-up    CAD   History of Present Illness: 79 yo female with h/o CAD, HTN, hyperlipidemia, hypothyroidism, AV block s/p PPM, CVA and basilar artery stenosis, mitral regurgitation and aortic stenosis here today for follow up. Left heart cath on 06/13/09 and she was found to have a severe stenosis in the RCA. There was minor disease in the LAD and Circumflex. A drug eluting stent was placed in the RCA. Repeat cardiac cath in August 2013 She was admitted to [facility] 08/10/11 with dizziness, chest pain and repeat cath showed stable disease. She was observed to have second degree AV block demonstrated with RBBB. Her coreg was stopped. She was discharged and wore an event monitor. This did not document arrhythmias or advanced AV block. There were no episodes of AV block documented. GXT was performed in our office 09/01/11. She was observed during the study to have a LBBB with exertion. She developed exertional symptoms of fatigue, SOB, and CP which corresponded to her LBBB. She was seen by Dr. Johney Frame and continued to have episodes of palpitations and dizziness with moderate activity. A permanent pacemaker was placed on 11/11/11 by Dr. Johney Frame. She has been undergoing cerebral angiograms for basilar artery stenosis and has had angiplasty of the basilar artery in August 2010. She is known to have an occluded left common carotid artery. She had a CVA in December 2012. She was Admitted to [facility] July 2015 with chest pain. Cardiac cath 07/27/13 with stable CAD. Echo May 2021 with LVEF=60-65%. Grade 1 diastolic dysfunction. Moderate aortic valve stenosis. Mild MR.   She is here today for follow up. The patient denies any chest pain, dyspnea, palpitations, lower extremity edema, orthopnea, PND, dizziness, near syncope or syncope.    Primary Care Physician: Joaquim Nam, MD  Past Medical History:  Diagnosis Date  . Arthritis    "fingers" (2013/03/13)  . Basilar  artery stenosis    90%  . Bladder spasms   . Blood transfusion 1960's   w/childbirth  . CAD (coronary artery disease)    a. 06/2009 cath: severe stenosis RCA with placement drug eluting stent;  b. 08/2011 cath: normal left main, luminal LAD irregularities, luminal LCx irregularities, luminal RCA irregularities and no ISR to mid-RCA DES, LVEF 60-65%; c. 02/2013 nonischemic CL, EF 79%.  . Family history of anesthesia complication    "first cousin had fever and almost died" (2013-03-13)  . Fibromyalgia   . HLD (hyperlipidemia) 01/1997  . Hypertension   . Hypothyroidism 1990  . IC (interstitial cystitis)   . Labile hypertension 08/1991  . Left-sided carotid artery obstruction   . Malignant hyperthermia    "told to warn everybody before I had surgery that my 1st cousin had malignant hyperthermia; I've had symptoms of it too" (2013/03/13)  . Obesity   . Pacemaker 11/11/2011   dual chamber pacemaker  . Pneumonia    "twice" (03-13-2013)  . Second degree Mobitz II AV block   . TIA (transient ischemic attack)    multiple/notes 08/02/2015    Past Surgical History:  Procedure Laterality Date  . basilar artery angioplasty  09/04/08   sm right brachial hematoma  . Bilateral Vert & Subclavian Angiogram  07/19/08   85-90% stenosis mid basilar art; occluded left vert artery; occluded left comm carotid artery w/ collat flow  . BLADDER SUSPENSION  1974  . CARDIAC CATHETERIZATION  1993   normal  . carotid arteriogram  bilat  . Carotid US  11/93   Occl L comm carotid  . Carotid US  4/98   L comm occl R-ok  . Carotid US  01/30/03   L occl  R less 40%  . CATARACT EXTRACTION W/ INTRAOCULAR LENS IMPLANT Bilateral 2007-2008  . Cath single vess dz  06/13/09   70% stenosis RCA EF 65-70%  . CHOLECYSTECTOMY  1985  . CORONARY ANGIOPLASTY WITH STENT PLACEMENT  06/19/09  . DEXA  10/15/04   Troch -0.4 o/w pos  . EMG  1/01   LE, RUE normal  . EYE SURGERY    . INSERT / REPLACE / REMOVE PACEMAKER  11/11/2011    MDT Adapta L implanted by Dr Johney Frame. [employer]  . LEFT HEART CATHETERIZATION WITH CORONARY ANGIOGRAM N/A 08/10/2011   Procedure: LEFT HEART CATHETERIZATION WITH CORONARY ANGIOGRAM;  Surgeon: Kathleene Hazel, MD;  Location: [facility] CATH LAB;  Service: Cardiovascular;  Laterality: N/A;  . LEFT HEART CATHETERIZATION WITH CORONARY ANGIOGRAM N/A 07/28/2013   Procedure: LEFT HEART CATHETERIZATION WITH CORONARY ANGIOGRAM;  Surgeon: Kathleene Hazel, MD;  Location: [facility] CATH LAB;  Service: Cardiovascular;  Laterality: N/A;  . PERMANENT PACEMAKER INSERTION N/A 11/11/2011   Procedure: PERMANENT PACEMAKER INSERTION;  Surgeon: Hillis Range, MD;  Location: [facility] CATH LAB;  Service: Cardiovascular;  Laterality: N/A;  . RENAL ANGIOPLASTY  1993   normal  . US RENAL/AORTA  12/93   Normal  . VAGINAL HYSTERECTOMY  1974   dysmennorhea  . Visual evoked response  11/93; 1/01   normal    Current Outpatient Medications  Medication Sig Dispense Refill  . acetaminophen (TYLENOL) 500 MG tablet Take 1,000 mg by mouth every 6 (six) hours as needed for moderate pain.     Marland Kitchen amLODipine (NORVASC) 5 MG tablet Take 1 tablet (5 mg total) by mouth 2 (two) times daily. 180 tablet 3  . aspirin EC 81 MG tablet Take 1 tablet (81 mg total) by mouth daily. 90 tablet 3  . clopidogrel (PLAVIX) 75 MG tablet Take 1 tablet (75 mg total) by mouth daily. 90 tablet 3  . hydrochlorothiazide (HYDRODIURIL) 25 MG tablet Take 25 mg by mouth daily.    Marland Kitchen levothyroxine (SYNTHROID) 100 MCG tablet Take 1 tablet (100 mcg total) by mouth daily. 90 tablet 3  . nitroGLYCERIN (NITROSTAT) 0.4 MG SL tablet Place 0.4 mg under the tongue every 5 (five) minutes as needed for chest pain (MAX 3 TABLETS).     Marland Kitchen olmesartan (BENICAR) 40 MG tablet Take 1 tablet (40 mg total) by mouth daily. 90 tablet 3   No current facility-administered medications for this visit.    Allergies  Allergen Reactions  . Anesthetics, Amide Other (See Comments)    REACTION:FEVER,  SYNCOPE  . Codeine Nausea And Vomiting and Other (See Comments)    Hallucinations  . Nitrofurantoin Nausea Only and Other (See Comments)    Hallucinations  . Orphenadrine Citrate Nausea And Vomiting and Other (See Comments)     Hallucination  . Pravastatin Other (See Comments)    Myalgias  . Simvastatin Other (See Comments)    myalgias  . Ibuprofen     Would avoid if possible  . Pentosan Polysulfate Sodium Rash and Other (See Comments)     rash/ severe headache    Social History   Socioeconomic History  . Marital status: Married    Spouse name: Not on file  . Number of children: 1  . Years of education: Not on file  .  Highest education level: Not on file  Occupational History  . Occupation: Bookkeeper-part time  . Occupation: retired    Associate Professormployer: RETIRED  Tobacco Use  . Smoking status: Never Smoker  . Smokeless tobacco: Never Used  Vaping Use  . Vaping Use: Never used  Substance and Sexual Activity  . Alcohol use: No    Alcohol/week: 0.0 standard drinks  . Drug use: No  . Sexual activity: Not Currently    Birth control/protection: Post-menopausal  Other Topics Concern  . 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   Widowed 2021.  Was married 1962; 1 child   Drinks coffee and coke daily    Social Determinants of Corporate investment bankerHealth   Financial Resource Strain: Low Risk   . Difficulty of Paying Living Expenses: Not hard at all  Food Insecurity: No Food Insecurity  . Worried About Programme researcher, broadcasting/film/videounning Out of Food in the Last Year: Never true  . Ran Out of Food in the Last Year: Never true  Transportation Needs: No Transportation Needs  . Lack of Transportation (Medical): No  . Lack of Transportation (Non-Medical): No  Physical Activity: Inactive  . Days of Exercise per Week: 0 days  . Minutes of Exercise per Session: 0 min  Stress: No Stress Concern Present  . Feeling of Stress : Not at all  Social Connections:   .  Frequency of Communication with Friends and Family: Not on file  . Frequency of Social Gatherings with Friends and Family: Not on file  . Attends Religious Services: Not on file  . Active Member of Clubs or Organizations: Not on file  . Attends BankerClub or Organization Meetings: Not on file  . Marital Status: Not on file  Intimate Partner Violence: Not At Risk  . Fear of Current or Ex-Partner: No  . Emotionally Abused: No  . Physically Abused: No  . Sexually Abused: No    Family History  Problem Relation Age of Onset  . Lung cancer Father   . Heart attack Mother   . Lymphoma Mother        s/p chemo  . Stroke Mother        x 2  . Diabetes Brother   . Breast cancer Maternal Aunt   . Colon cancer Neg Hx     Review of Systems:  As stated in the HPI and otherwise negative.   BP 122/70   Pulse 67   Ht 4' 11.5" (1.511 m)   Wt 148 lb (67.1 kg)   SpO2 96%   BMI 29.39 kg/m   Physical Examination:  General: Well developed, well nourished, NAD  HEENT: OP clear, mucus membranes moist  SKIN: warm, dry. No rashes. Neuro: No focal deficits  Musculoskeletal: Muscle strength 5/5 all ext  Psychiatric: Mood and affect normal  Neck: No JVD, no carotid bruits, no thyromegaly, no lymphadenopathy.  Lungs:Clear bilaterally, no wheezes, rhonci, crackles Cardiovascular: Regular rate and rhythm. No murmurs, gallops or rubs. Abdomen:Soft. Bowel sounds present. Non-tender.  Extremities: No lower extremity edema. Pulses are 2 + in the bilateral DP/PT.  Cardiac cath 07/27/13: Left main: No obstructive disease.  Left Anterior Descending Artery: Large caliber vessel that courses to the apex. The mid vessel has diffuse 20% stenosis. There is a very small caliber diagonal branch with proximal 40% stenosis. There are no flow limiting lesions noted.  Circumflex Artery: Moderate caliber vessel with moderate caliber obtuse marginal branch. No obstructive disease.  Right  Coronary Artery: Large caliber vessel  with 20% mid stenosis, patent distal stent without restenosis.  Left Ventricular Angiogram: LVEF=65-70%.  Impression:  1. Single vessel CAD with patent stent RCA  2. Normal LV function  3. Non-cardiac chest pain   Echo May 2021:  1. Left ventricular ejection fraction, by estimation, is 60 to 65%. The  left ventricle has normal function. The left ventricle has no regional  wall motion abnormalities. There is moderate asymmetric left ventricular  hypertrophy of the basal-septal  segment. Left ventricular diastolic parameters are consistent with Grade I  diastolic dysfunction (impaired relaxation).  2. Right ventricular systolic function is normal. The right ventricular  size is normal. There is normal pulmonary artery systolic pressure. The  estimated right ventricular systolic pressure is 30.9 mmHg.  3. The mitral valve is grossly normal. Mild mitral valve regurgitation.  No evidence of mitral stenosis.  4. The aortic valve is tricuspid. Aortic valve regurgitation is not  visualized. Moderate aortic valve stenosis. Aortic valve area, by VTI  measures 1.23 cm. Aortic valve mean gradient measures 22.0 mmHg. Aortic  valve Vmax measures 3.21 m/s.  5. The inferior vena cava is normal in size with greater than 50%  respiratory variability, suggesting right atrial pressure of 3 mmHg.   EKG:  EKG is not ordered today. The ekg ordered today demonstrates .   Recent Labs: 04/20/2019: ALT 15; BUN 23; Creatinine, Ser 1.15; Hemoglobin 14.5; Platelets 239.0; Potassium 3.9; Sodium 139; TSH 0.48   Lipid Panel    Component Value Date/Time   CHOL 192 12/15/2018 0911   TRIG 194.0 (H) 12/15/2018 0911   HDL 44.40 12/15/2018 0911   CHOLHDL 4 12/15/2018 0911   VLDL 38.8 12/15/2018 0911   LDLCALC 108 (H) 12/15/2018 0911   LDLDIRECT 145.0 10/01/2014 1336     Wt Readings from Last 3 Encounters:  09/22/19 148 lb (67.1 kg)  06/15/19 156 lb 1 oz (70.8 kg)  04/20/19 160 lb 2 oz (72.6 kg)      Other studies Reviewed: Additional studies/ records that were reviewed today include: . Review of the above records demonstrates:    Assessment and Plan:   1. CAD without angina: No chest pain. Last cath in July 2015 with stable CAD. Will continue ASA and Plavix given prior CVA and history of coronary stent.  She does not tolerate statins.   2. High grade symptomatic AV block: s/p PPM 11/10/11 per Dr. Johney Frame. She is followed in the pacer clinic.   3. HTN: BP is controlled  4. Chronic diastolic CHF: No volume overload. Continue Lasix prn  5. Aortic valve stenosis: Moderate by echo May 2021.    6. Mitral valve regurgitation: Mild by echo in May 2021  Current medicines are reviewed at length with the patient today.  The patient does not have concerns regarding medicines.  The following changes have been made:  no change  Labs/ tests ordered today include:   No orders of the defined types were placed in this encounter.   Disposition:   F/U with me in 12  months  Signed, Verne Carrow, MD 09/22/2019 10:36 AM    [facility] [address] [place], [place], [place]  [address] Phone: [phone]; Fax: [phone]

## 2019-09-22 NOTE — Patient Instructions (Signed)
Medication Instructions:  No changes *If you need a refill on your cardiac medications before your next appointment, please call your pharmacy*   Lab Work: none If you have labs (blood work) drawn today and your tests are completely normal, you will receive your results only by:  MyChart Message (if you have MyChart) OR  A paper copy in the mail If you have any lab test that is abnormal or we need to change your treatment, we will call you to review the results.   Testing/Procedures:    THIS IS DUE IN MAY 2022 Your physician has requested that you have an echocardiogram. Echocardiography is a painless test that uses sound waves to create images of your heart. It provides your doctor with information about the size and shape of your heart and how well your hearts chambers and valves are working. This procedure takes approximately one hour. There are no restrictions for this procedure.    Follow-Up: At [facility], you and your health needs are our priority.  As part of our continuing mission to provide you with exceptional heart care, we have created designated Provider Care Teams.  These Care Teams include your primary Cardiologist (physician) and Advanced Practice Providers (APPs -  Physician Assistants and Nurse Practitioners) who all work together to provide you with the care you need, when you need it.  We recommend signing up for the patient portal called "MyChart".  Sign up information is provided on this After Visit Summary.  MyChart is used to connect with patients for Virtual Visits (Telemedicine).  Patients are able to view lab/test results, encounter notes, upcoming appointments, etc.  Non-urgent messages can be sent to your provider as well.   To learn more about what you can do with MyChart, go to [email or web address].    Your next appointment:   12 month(s)  The format for your next appointment:   In Person  Provider:   You may see Verne Carrow, MD or one  of the following Advanced Practice Providers on your designated Care Team:    Ronie Spies, PA-C  Jacolyn Reedy, PA-C    Other Instructions

## 2019-09-23 ENCOUNTER — Ambulatory Visit: Payer: Medicare HMO

## 2019-10-18 ENCOUNTER — Ambulatory Visit (INDEPENDENT_AMBULATORY_CARE_PROVIDER_SITE_OTHER): Payer: Medicare HMO

## 2019-10-18 DIAGNOSIS — I441 Atrioventricular block, second degree: Secondary | ICD-10-CM

## 2019-10-20 LAB — CUP PACEART REMOTE DEVICE CHECK
Battery Impedance: 1037 Ohm
Battery Remaining Longevity: 56 mo
Battery Voltage: 2.77 V
Brady Statistic AP VP Percent: 38 %
Brady Statistic AP VS Percent: 0 %
Brady Statistic AS VP Percent: 62 %
Brady Statistic AS VS Percent: 0 %
Date Time Interrogation Session: 20211013080735
Implantable Lead Implant Date: 20131106
Implantable Lead Implant Date: 20131106
Implantable Lead Location: 753859
Implantable Lead Location: 753860
Implantable Lead Model: 5076
Implantable Lead Model: 5092
Implantable Pulse Generator Implant Date: 20131106
Lead Channel Impedance Value: 418 Ohm
Lead Channel Impedance Value: 563 Ohm
Lead Channel Pacing Threshold Amplitude: 0.375 V
Lead Channel Pacing Threshold Amplitude: 1.25 V
Lead Channel Pacing Threshold Pulse Width: 0.4 ms
Lead Channel Pacing Threshold Pulse Width: 0.4 ms
Lead Channel Setting Pacing Amplitude: 2 V
Lead Channel Setting Pacing Amplitude: 2.5 V
Lead Channel Setting Pacing Pulse Width: 0.4 ms
Lead Channel Setting Sensing Sensitivity: 2 mV

## 2019-10-21 ENCOUNTER — Other Ambulatory Visit: Payer: Self-pay

## 2019-10-21 ENCOUNTER — Ambulatory Visit (INDEPENDENT_AMBULATORY_CARE_PROVIDER_SITE_OTHER): Payer: Medicare HMO

## 2019-10-21 DIAGNOSIS — Z23 Encounter for immunization: Secondary | ICD-10-CM | POA: Diagnosis not present

## 2019-10-24 NOTE — Progress Notes (Signed)
Remote pacemaker transmission.   

## 2019-11-12 ENCOUNTER — Other Ambulatory Visit: Payer: Self-pay | Admitting: Family Medicine

## 2019-11-12 DIAGNOSIS — I10 Essential (primary) hypertension: Secondary | ICD-10-CM

## 2019-11-12 DIAGNOSIS — E559 Vitamin D deficiency, unspecified: Secondary | ICD-10-CM

## 2019-11-21 ENCOUNTER — Other Ambulatory Visit: Payer: Self-pay

## 2019-11-21 ENCOUNTER — Other Ambulatory Visit (INDEPENDENT_AMBULATORY_CARE_PROVIDER_SITE_OTHER): Payer: Medicare HMO

## 2019-11-21 DIAGNOSIS — E559 Vitamin D deficiency, unspecified: Secondary | ICD-10-CM

## 2019-11-21 DIAGNOSIS — I10 Essential (primary) hypertension: Secondary | ICD-10-CM

## 2019-11-21 LAB — LIPID PANEL
Cholesterol: 194 mg/dL (ref 0–200)
HDL: 43.4 mg/dL (ref >39.00)
LDL Cholesterol: 116 mg/dL — ABNORMAL HIGH (ref 0–99)
NonHDL: 150.53
Total CHOL/HDL Ratio: 4
Triglycerides: 171 mg/dL — ABNORMAL HIGH (ref 0.0–149.0)
VLDL: 34.2 mg/dL (ref 0.0–40.0)

## 2019-11-21 LAB — COMPREHENSIVE METABOLIC PANEL
ALT: 13 U/L (ref 0–35)
AST: 17 U/L (ref 0–37)
Albumin: 4.3 g/dL (ref 3.5–5.2)
Alkaline Phosphatase: 57 U/L (ref 39–117)
BUN: 15 mg/dL (ref 6–23)
CO2: 30 mEq/L (ref 19–32)
Calcium: 10.2 mg/dL (ref 8.4–10.5)
Chloride: 102 mEq/L (ref 96–112)
Creatinine, Ser: 0.94 mg/dL (ref 0.40–1.20)
GFR: 58.72 mL/min — ABNORMAL LOW (ref >60.00)
Glucose, Bld: 101 mg/dL — ABNORMAL HIGH (ref 70–99)
Potassium: 4.1 mEq/L (ref 3.5–5.1)
Sodium: 140 mEq/L (ref 135–145)
Total Bilirubin: 0.7 mg/dL (ref 0.2–1.2)
Total Protein: 6.8 g/dL (ref 6.0–8.3)

## 2019-11-21 LAB — TSH: TSH: 0.31 u[IU]/mL — ABNORMAL LOW (ref 0.35–4.50)

## 2019-11-21 LAB — VITAMIN D 25 HYDROXY (VIT D DEFICIENCY, FRACTURES): VITD: 25.7 ng/mL — ABNORMAL LOW (ref 30.00–100.00)

## 2019-11-27 ENCOUNTER — Ambulatory Visit: Payer: Medicare HMO

## 2019-11-28 ENCOUNTER — Ambulatory Visit (INDEPENDENT_AMBULATORY_CARE_PROVIDER_SITE_OTHER): Payer: Medicare HMO | Admitting: Family Medicine

## 2019-11-28 ENCOUNTER — Other Ambulatory Visit: Payer: Self-pay

## 2019-11-28 ENCOUNTER — Encounter: Payer: Self-pay | Admitting: Family Medicine

## 2019-11-28 VITALS — BP 140/62 | HR 70 | Temp 98.2°F | Ht 59.0 in | Wt 148.8 lb

## 2019-11-28 DIAGNOSIS — Z0000 Encounter for general adult medical examination without abnormal findings: Secondary | ICD-10-CM

## 2019-11-28 DIAGNOSIS — E039 Hypothyroidism, unspecified: Secondary | ICD-10-CM

## 2019-11-28 DIAGNOSIS — Z7189 Other specified counseling: Secondary | ICD-10-CM

## 2019-11-28 DIAGNOSIS — E559 Vitamin D deficiency, unspecified: Secondary | ICD-10-CM

## 2019-11-28 DIAGNOSIS — I10 Essential (primary) hypertension: Secondary | ICD-10-CM

## 2019-11-28 DIAGNOSIS — Z66 Do not resuscitate: Secondary | ICD-10-CM

## 2019-11-28 MED ORDER — CLOPIDOGREL BISULFATE 75 MG PO TABS: 75.0000 mg | ORAL_TABLET | Freq: Every day | ORAL | 3 refills | Status: DC

## 2019-11-28 MED ORDER — AMLODIPINE BESYLATE 5 MG PO TABS: 5.0000 mg | ORAL_TABLET | Freq: Two times a day (BID) | ORAL | 3 refills | Status: DC

## 2019-11-28 MED ORDER — LEVOTHYROXINE SODIUM 100 MCG PO TABS: 100.0000 ug | ORAL_TABLET | Freq: Every day | ORAL | 3 refills | Status: DC

## 2019-11-28 MED ORDER — OLMESARTAN MEDOXOMIL 40 MG PO TABS: 40.0000 mg | ORAL_TABLET | Freq: Every day | ORAL | 3 refills | Status: DC

## 2019-11-28 MED ORDER — HYDROCHLOROTHIAZIDE 25 MG PO TABS: 25.0000 mg | ORAL_TABLET | Freq: Every day | ORAL | 3 refills | Status: DC

## 2019-11-28 MED ORDER — VITAMIN D (CHOLECALCIFEROL) 25 MCG (1000 UT) PO TABS: 1000.0000 [IU] | ORAL_TABLET | Freq: Every day | ORAL | Status: DC

## 2019-11-28 NOTE — Patient Instructions (Signed)
Plan on recheck in about 3 months at a nonfasting lab visit.  Take care.  Glad to see you.

## 2019-11-28 NOTE — Progress Notes (Signed)
This visit occurred during the SARS-CoV-2 public health emergency.  Safety protocols were in place, including screening questions prior to the visit, additional usage of staff PPE, and extensive cleaning of exam room while observing appropriate contact time as indicated for disinfecting solutions.  Her husband passed.  Condolences offered.  We talked about plans for the holidays, she is going to cook and have family come by briefly.  "I don't want to act like things are normal when it's not."  D/w pt.  She'll update me as needed.    covid cautions d/w pt.    Hypertension:    Using medication without problems or lightheadedness: yes Chest pain with exertion:no Edema:no Short of breath:no Labs d/w pt.   She has f/u echo pending for 2022.   Statin intolerant.    Hypothyroidism.  Compliant.  No ADE on med.  No neck mass. No dysphagia.  TSH minimally low.    Flu shot 2021.   covid vaccine encouraged. D/w pt. she has not yet gotten vaccinated.  She views herself as being a low risk because she has limited social contact.  Discussed.  Still encouraged vaccination. PNA up to date Tetanus 1999. Declined colonoscopy in 2021.  D/w pt.   Mammogram- dw pt, encouraged. She declined at this point. She does manual checks.  DXA d/w pt. She'll consider. Defer for now per patient request.  Advance directive- Son Kelsey Dunn, brother Kelsey Dunn, and granddaughter Kelsey Dunn all equally designated ifpatient wereincapacitated.  She explicitly wanted a DNR form filled out, d/w pt.  Done at OV.    Mildly low vit D discussed with patient.  Discussed rechecking a level after restarting vitamin D in the meantime.  Meds, vitals, and allergies reviewed.   ROS: Per HPI unless specifically indicated in ROS section   GEN: nad, alert and oriented HEENT: ncat NECK: supple w/o LA CV: rrr soft systolic murmur noted. PULM: ctab, no inc wob ABD: soft, +bs EXT: no edema SKIN: no acute rash  but benign-appearing seborrheic keratosis noted on the upper chest wall.

## 2019-11-29 DIAGNOSIS — Z66 Do not resuscitate: Secondary | ICD-10-CM | POA: Insufficient documentation

## 2019-11-29 NOTE — Assessment & Plan Note (Signed)
Mildly low vit D discussed with patient.  Discussed rechecking a level after restarting vitamin D in the meantime.  See after visit summary.

## 2019-11-29 NOTE — Assessment & Plan Note (Signed)
Advance directive- Son Kenneth Mcgurk, brother Jerry Kenneth Merritt, and granddaughter Kolbe Ray Frisch all equally designated ifpatient wereincapacitated.  She explicitly wanted a DNR form filled out, d/w pt.  Done at OV. 

## 2019-11-29 NOTE — Assessment & Plan Note (Signed)
Flu shot 2021.   covid vaccine encouraged. D/w pt. she has not yet gotten vaccinated.  She views herself as being a low risk because she has limited social contact.  Discussed.  Still encouraged vaccination. PNA up to date Tetanus 1999. Declined colonoscopy in 2021.  D/w pt.   Mammogram- dw pt, encouraged. She declined at this point. She does manual checks.  DXA d/w pt. She'll consider. Defer for now per patient request.  Advance directive- Son Kelsey Dunn, brother Kelsey Dunn, and granddaughter Kelsey Dunn all equally designated ifpatient wereincapacitated.  She explicitly wanted a DNR form filled out, d/w pt.  Done at OV.

## 2019-11-29 NOTE — Assessment & Plan Note (Signed)
Advance directive- Son Emiya Loomer, brother Jeananne Rama, and granddaughter Tariyah Pendry all equally designated ifpatient wereincapacitated.  She explicitly wanted a DNR form filled out, d/w pt.  Done at OV.

## 2019-11-30 NOTE — Assessment & Plan Note (Signed)
TSH minimally low.   Would not change her dose at this point but recheck a TSH when we recheck her vitamin D level.  If persistently abnormal then we can address it then.  She agrees.

## 2019-11-30 NOTE — Assessment & Plan Note (Signed)
Labs d/w pt.   She has f/u echo pending for 2022.   Statin intolerant.  Blood pressure controlled.  Continue amlodipine hydrochlorothiazide olmesartan

## 2019-12-19 ENCOUNTER — Ambulatory Visit (INDEPENDENT_AMBULATORY_CARE_PROVIDER_SITE_OTHER): Payer: Medicare HMO

## 2019-12-19 ENCOUNTER — Other Ambulatory Visit: Payer: Self-pay

## 2019-12-19 DIAGNOSIS — Z0000 Encounter for general adult medical examination without abnormal findings: Secondary | ICD-10-CM

## 2019-12-19 NOTE — Patient Instructions (Signed)
Kelsey Dunn , Thank you for taking time to come for your Medicare Wellness Visit. I appreciate your ongoing commitment to your health goals. Please review the following plan we discussed and let me know if I can assist you in the future.   Screening recommendations/referrals: Colonoscopy: declined Mammogram: declined Bone Density: declined  Recommended yearly ophthalmology/optometry visit for glaucoma screening and checkup Recommended yearly dental visit for hygiene and checkup  Vaccinations: Influenza vaccine: Up to date, completed 10/21/2019, due 08/2020 Pneumococcal vaccine: Completed series Tdap vaccine: decline-insurance Shingles vaccine: due, check with your insurance regarding coverage/cost if interested    Covid-19:declined  Advanced directives: Please bring a copy of your POA (Power of Attorney) and/or Living Will to your next appointment.   Conditions/risks identified: hyperlipidemia  Next appointment: Follow up in one year for your annual wellness visit    Preventive Care 65 Years and Older, Female Preventive care refers to lifestyle choices and visits with your health care provider that can promote health and wellness. What does preventive care include?  A yearly physical exam. This is also called an annual well check.  Dental exams once or twice a year.  Routine eye exams. Ask your health care provider how often you should have your eyes checked.  Personal lifestyle choices, including:  Daily care of your teeth and gums.  Regular physical activity.  Eating a healthy diet.  Avoiding tobacco and drug use.  Limiting alcohol use.  Practicing safe sex.  Taking low-dose aspirin every day.  Taking vitamin and mineral supplements as recommended by your health care provider. What happens during an annual well check? The services and screenings done by your health care provider during your annual well check will depend on your age, overall health, lifestyle risk  factors, and family history of disease. Counseling  Your health care provider may ask you questions about your:  Alcohol use.  Tobacco use.  Drug use.  Emotional well-being.  Home and relationship well-being.  Sexual activity.  Eating habits.  History of falls.  Memory and ability to understand (cognition).  Work and work Astronomer.  Reproductive health. Screening  You may have the following tests or measurements:  Height, weight, and BMI.  Blood pressure.  Lipid and cholesterol levels. These may be checked every 5 years, or more frequently if you are over 58 years old.  Skin check.  Lung cancer screening. You may have this screening every year starting at age 50 if you have a 30-pack-year history of smoking and currently smoke or have quit within the past 15 years.  Fecal occult blood test (FOBT) of the stool. You may have this test every year starting at age 84.  Flexible sigmoidoscopy or colonoscopy. You may have a sigmoidoscopy every 5 years or a colonoscopy every 10 years starting at age 72.  Hepatitis C blood test.  Hepatitis B blood test.  Sexually transmitted disease (STD) testing.  Diabetes screening. This is done by checking your blood sugar (glucose) after you have not eaten for a while (fasting). You may have this done every 1-3 years.  Bone density scan. This is done to screen for osteoporosis. You may have this done starting at age 96.  Mammogram. This may be done every 1-2 years. Talk to your health care provider about how often you should have regular mammograms. Talk with your health care provider about your test results, treatment options, and if necessary, the need for more tests. Vaccines  Your health care provider may recommend certain vaccines,  such as:  Influenza vaccine. This is recommended every year.  Tetanus, diphtheria, and acellular pertussis (Tdap, Td) vaccine. You may need a Td booster every 10 years.  Zoster vaccine. You  may need this after age 61.  Pneumococcal 13-valent conjugate (PCV13) vaccine. One dose is recommended after age 14.  Pneumococcal polysaccharide (PPSV23) vaccine. One dose is recommended after age 19. Talk to your health care provider about which screenings and vaccines you need and how often you need them. This information is not intended to replace advice given to you by your health care provider. Make sure you discuss any questions you have with your health care provider. Document Released: 01/18/2015 Document Revised: 09/11/2015 Document Reviewed: 10/23/2014 Elsevier Interactive Patient Education  2017 [employer] Prevention in the Home Falls can cause injuries. They can happen to people of all ages. There are many things you can do to make your home safe and to help prevent falls. What can I do on the outside of my home?  Regularly fix the edges of walkways and driveways and fix any cracks.  Remove anything that might make you trip as you walk through a door, such as a raised step or threshold.  Trim any bushes or trees on the path to your home.  Use bright outdoor lighting.  Clear any walking paths of anything that might make someone trip, such as rocks or tools.  Regularly check to see if handrails are loose or broken. Make sure that both sides of any steps have handrails.  Any raised decks and porches should have guardrails on the edges.  Have any leaves, snow, or ice cleared regularly.  Use sand or salt on walking paths during winter.  Clean up any spills in your garage right away. This includes oil or grease spills. What can I do in the bathroom?  Use night lights.  Install grab bars by the toilet and in the tub and shower. Do not use towel bars as grab bars.  Use non-skid mats or decals in the tub or shower.  If you need to sit down in the shower, use a plastic, non-slip stool.  Keep the floor dry. Clean up any water that spills on the floor as soon as  it happens.  Remove soap buildup in the tub or shower regularly.  Attach bath mats securely with double-sided non-slip rug tape.  Do not have throw rugs and other things on the floor that can make you trip. What can I do in the bedroom?  Use night lights.  Make sure that you have a light by your bed that is easy to reach.  Do not use any sheets or blankets that are too big for your bed. They should not hang down onto the floor.  Have a firm chair that has side arms. You can use this for support while you get dressed.  Do not have throw rugs and other things on the floor that can make you trip. What can I do in the kitchen?  Clean up any spills right away.  Avoid walking on wet floors.  Keep items that you use a lot in easy-to-reach places.  If you need to reach something above you, use a strong step stool that has a grab bar.  Keep electrical cords out of the way.  Do not use floor polish or wax that makes floors slippery. If you must use wax, use non-skid floor wax.  Do not have throw rugs and other things on  the floor that can make you trip. What can I do with my stairs?  Do not leave any items on the stairs.  Make sure that there are handrails on both sides of the stairs and use them. Fix handrails that are broken or loose. Make sure that handrails are as long as the stairways.  Check any carpeting to make sure that it is firmly attached to the stairs. Fix any carpet that is loose or worn.  Avoid having throw rugs at the top or bottom of the stairs. If you do have throw rugs, attach them to the floor with carpet tape.  Make sure that you have a light switch at the top of the stairs and the bottom of the stairs. If you do not have them, ask someone to add them for you. What else can I do to help prevent falls?  Wear shoes that:  Do not have high heels.  Have rubber bottoms.  Are comfortable and fit you well.  Are closed at the toe. Do not wear sandals.  If  you use a stepladder:  Make sure that it is fully opened. Do not climb a closed stepladder.  Make sure that both sides of the stepladder are locked into place.  Ask someone to hold it for you, if possible.  Clearly mark and make sure that you can see:  Any grab bars or handrails.  First and last steps.  Where the edge of each step is.  Use tools that help you move around (mobility aids) if they are needed. These include:  Canes.  Walkers.  Scooters.  Crutches.  Turn on the lights when you go into a dark area. Replace any light bulbs as soon as they burn out.  Set up your furniture so you have a clear path. Avoid moving your furniture around.  If any of your floors are uneven, fix them.  If there are any pets around you, be aware of where they are.  Review your medicines with your doctor. Some medicines can make you feel dizzy. This can increase your chance of falling. Ask your doctor what other things that you can do to help prevent falls. This information is not intended to replace advice given to you by your health care provider. Make sure you discuss any questions you have with your health care provider. Document Released: 10/18/2008 Document Revised: 05/30/2015 Document Reviewed: 01/26/2014 Elsevier Interactive Patient Education  2017 [employer].

## 2019-12-19 NOTE — Progress Notes (Signed)
PCP notes:  Health Maintenance: Tdap- insurance Covid- declined Colonoscopy- declined Mammogram-declined Dexa- declined    Abnormal Screenings: None   Patient concerns: none   Nurse concerns:  none  Next PCP appt.:

## 2019-12-19 NOTE — Progress Notes (Signed)
Subjective:   Kelsey Dunn is a 79 y.o. female who presents for Medicare Annual (Subsequent) preventive examination.  Review of Systems: N/A      I connected with the patient today by telephone and verified that I am speaking with the correct person using two identifiers. Location patient: home Location nurse: work Persons participating in the telephone visit: patient, nurse.   I discussed the limitations, risks, security and privacy concerns of performing an evaluation and management service by telephone and the availability of in person appointments. I also discussed with the patient that there may be a patient responsible charge related to this service. The patient expressed understanding and verbally consented to this telephonic visit.        Cardiac Risk Factors include: advanced age (>38men, >85 women);Other (see comment), Risk factor comments: hyperlipidemia     Objective:    Today's Vitals   There is no height or weight on file to calculate BMI.  Advanced Directives 12/19/2019 11/18/2018 08/20/2017 08/19/2016 03/08/2016 08/02/2015 05/28/2015  Does Patient Have a Medical Advance Directive? Yes No No No No;Yes No No  Type of Estate agent of [place];Living will - - - Healthcare Power of Attorney - -  Copy of Healthcare Power of Attorney in Chart? No - copy requested - - - - - -  Would patient like information on creating a medical advance directive? - No - Patient declined No - Patient declined - - No - patient declined information No - patient declined information  Pre-existing out of facility DNR order (yellow form or pink MOST form) - - - - - - -    Current Medications (verified) Outpatient Encounter Medications as of 12/19/2019  Medication Sig  . acetaminophen (TYLENOL) 500 MG tablet Take 1,000 mg by mouth every 6 (six) hours as needed for moderate pain.   Marland Kitchen amLODipine (NORVASC) 5 MG tablet Take 1 tablet (5 mg total) by mouth 2 (two) times daily.   Marland Kitchen aspirin EC 81 MG tablet Take 1 tablet (81 mg total) by mouth daily.  . clopidogrel (PLAVIX) 75 MG tablet Take 1 tablet (75 mg total) by mouth daily.  . hydrochlorothiazide (HYDRODIURIL) 25 MG tablet Take 1 tablet (25 mg total) by mouth daily.  Marland Kitchen levothyroxine (SYNTHROID) 100 MCG tablet Take 1 tablet (100 mcg total) by mouth daily.  . nitroGLYCERIN (NITROSTAT) 0.4 MG SL tablet Place 0.4 mg under the tongue every 5 (five) minutes as needed for chest pain (MAX 3 TABLETS).   Marland Kitchen olmesartan (BENICAR) 40 MG tablet Take 1 tablet (40 mg total) by mouth daily.  . Vitamin D, Cholecalciferol, 25 MCG (1000 UT) TABS Take 1,000 Units by mouth daily.   No facility-administered encounter medications on file as of 12/19/2019.    Allergies (verified) Anesthetics, amide; Codeine; Nitrofurantoin; Orphenadrine citrate; Pravastatin; Simvastatin; Ibuprofen; and Pentosan polysulfate sodium   History: Past Medical History:  Diagnosis Date  . Arthritis    "fingers" (March 05, 2013)  . Basilar artery stenosis    90%  . Bladder spasms   . Blood transfusion 1960's   w/childbirth  . CAD (coronary artery disease)    a. 06/2009 cath: severe stenosis RCA with placement drug eluting stent;  b. 08/2011 cath: normal left main, luminal LAD irregularities, luminal LCx irregularities, luminal RCA irregularities and no ISR to mid-RCA DES, LVEF 60-65%; c. 02/2013 nonischemic CL, EF 79%.  . Family history of anesthesia complication    "first cousin had fever and almost died" (03-05-2013)  . Fibromyalgia   .  HLD (hyperlipidemia) 01/1997  . Hypertension   . Hypothyroidism 1990  . IC (interstitial cystitis)   . Labile hypertension 08/1991  . Left-sided carotid artery obstruction   . Malignant hyperthermia    "told to warn everybody before I had surgery that my 1st cousin had malignant hyperthermia; I've had symptoms of it too" (02/27/2013)  . Obesity   . Pacemaker 11/11/2011   dual chamber pacemaker  . Pneumonia    "twice"  (02/27/2013)  . Second degree Mobitz II AV block   . TIA (transient ischemic attack)    multiple/notes 08/02/2015   Past Surgical History:  Procedure Laterality Date  . basilar artery angioplasty  09/04/08   sm right brachial hematoma  . Bilateral Vert & Subclavian Angiogram  07/19/08   85-90% stenosis mid basilar art; occluded left vert artery; occluded left comm carotid artery w/ collat flow  . BLADDER SUSPENSION  1974  . CARDIAC CATHETERIZATION  1993   normal  . carotid arteriogram     bilat  . Carotid US  11/93   Occl L comm carotid  . Carotid US  4/98   L comm occl R-ok  . Carotid US  01/30/03   L occl  R less 40%  . CATARACT EXTRACTION W/ INTRAOCULAR LENS IMPLANT Bilateral 2007-2008  . Cath single vess dz  06/13/09   70% stenosis RCA EF 65-70%  . CHOLECYSTECTOMY  1985  . CORONARY ANGIOPLASTY WITH STENT PLACEMENT  06/19/09  . DEXA  10/15/04   Troch -0.4 o/w pos  . EMG  1/01   LE, RUE normal  . EYE SURGERY    . INSERT / REPLACE / REMOVE PACEMAKER  11/11/2011   MDT Adapta L implanted by Dr Johney Frame. [employer]  . LEFT HEART CATHETERIZATION WITH CORONARY ANGIOGRAM N/A 08/10/2011   Procedure: LEFT HEART CATHETERIZATION WITH CORONARY ANGIOGRAM;  Surgeon: Kathleene Hazel, MD;  Location[facility] CATH LAB;  Service: Cardiovascular;  Laterality: N/A;  . LEFT HEART CATHETERIZATION WITH CORONARY ANGIOGRAM N/A 07/28/2013   Procedure: LEFT HEART CATHETERIZATION WITH CORONARY ANGIOGRAM;  Surgeon: Kathleene Hazel, MD;  Location: [facility] CATH LAB;  Service: Cardiovascular;  Laterality: N/A;  . PERMANENT PACEMAKER INSERTION N/A 11/11/2011   Procedure: PERMANENT PACEMAKER INSERTION;  Surgeon: Hillis Range, MD;  Location[facility] CATH LAB;  Service: Cardiovascular;  Laterality: N/A;  . RENAL ANGIOPLASTY  1993   normal  . US RENAL/AORTA  12/93   Normal  . VAGINAL HYSTERECTOMY  1974   dysmennorhea  . Visual evoked response  11/93; 1/01   normal   Family History  Problem Relation Age of Onset  .  Lung cancer Father   . Heart attack Mother   . Lymphoma Mother        s/p chemo  . Stroke Mother        x 2  . Diabetes Brother   . Breast cancer Maternal Aunt   . Colon cancer Neg Hx    Social History   Socioeconomic History  . Marital status: Married    Spouse name: Not on file  . Number of children: 1  . Years of education: Not on file  . Highest education level: Not on file  Occupational History  . Occupation: Bookkeeper-part time  . Occupation: retired    Associate Professor: RETIRED  Tobacco Use  . Smoking status: Never Smoker  . Smokeless tobacco: Never Used  Vaping Use  . Vaping Use: Never used  Substance and Sexual Activity  . Alcohol use: No  Alcohol/week: 0.0 standard drinks  . Drug use: No  . Sexual activity: Not Currently    Birth control/protection: Post-menopausal  Other Topics Concern  . 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   Widowed 2021.  Was married 1962; 1 child   Drinks coffee and coke daily    Social Determinants of Corporate investment banker Strain: Low Risk   . Difficulty of Paying Living Expenses: Not hard at all  Food Insecurity: No Food Insecurity  . Worried About Programme researcher, broadcasting/film/video in the Last Year: Never true  . Ran Out of Food in the Last Year: Never true  Transportation Needs: No Transportation Needs  . Lack of Transportation (Medical): No  . Lack of Transportation (Non-Medical): No  Physical Activity: Sufficiently Active  . Days of Exercise per Week: 7 days  . Minutes of Exercise per Session: 30 min  Stress: No Stress Concern Present  . Feeling of Stress : Not at all  Social Connections: Not on file    Tobacco Counseling Counseling given: Not Answered   Clinical Intake:  Pre-visit preparation completed: Yes  Pain : No/denies pain     Nutritional Risks: None Diabetes: No  How often do you need to have someone help you when you read instructions,  pamphlets, or other written materials from your doctor or pharmacy?: 1 - Never What is the last grade level you completed in school?: 12th  Diabetic: No Nutrition Risk Assessment:  Has the patient had any N/V/D within the last 2 months?  No  Does the patient have any non-healing wounds?  No  Has the patient had any unintentional weight loss or weight gain?  No   Diabetes:  Is the patient diabetic?  No  If diabetic, was a CBG obtained today?  N/A Did the patient bring in their glucometer from home?  N/A How often do you monitor your CBG's? N/A.   Financial Strains and Diabetes Management:  Are you having any financial strains with the device, your supplies or your medication? N/A.  Does the patient want to be seen by Chronic Care Management for management of their diabetes?  N/A Would the patient like to be referred to a Nutritionist or for Diabetic Management?  N/A    Interpreter Needed?: No  Information entered by :: CJohnson, LPN   Activities of Daily Living In your present state of health, do you have any difficulty performing the following activities: 12/19/2019  Hearing? N  Vision? N  Difficulty concentrating or making decisions? N  Walking or climbing stairs? N  Dressing or bathing? N  Doing errands, shopping? N  Preparing Food and eating ? N  Using the Toilet? N  In the past six months, have you accidently leaked urine? N  Do you have problems with loss of bowel control? N  Managing your Medications? N  Managing your Finances? N  Housekeeping or managing your Housekeeping? N  Some recent data might be hidden    Patient Care Team: Joaquim Nam, MD as PCP - General (Family Medicine) Kathleene Hazel, MD as PCP - Cardiology (Cardiology) Kathleene Hazel, MD as Consulting Physician (Cardiology) Hillis Range, MD as Consulting Physician (Cardiology) Galen Manila, MD as Referring Physician (Ophthalmology)  Indicate any recent Medical  Services you may have received from other than [facility] providers in the past year (date may be approximate).     Assessment:   This is  a routine wellness examination for [place].  Hearing/Vision screen  Hearing Screening             Right ear:           Left ear:           Vision Screening Comments: Patient gets annual eye exams   Dietary issues and exercise activities discussed: Current Exercise Habits: Home exercise routine, Type of exercise: walking, Time (Minutes): 30, Frequency (Times/Week): 7, Weekly Exercise (Minutes/Week): 210, Intensity: Moderate, Exercise limited by: None identified  Goals    . Increase physical activity     Starting 08/19/2016, I will continue to walk at least 30 min daily.     . Patient Stated     11/18/2018, I will maintain and continue medications as prescribed.     . Patient Stated     12/19/2019, I will continue to walk daily for about 30 minutes.      Depression Screen PHQ 2/9 Scores 12/19/2019 11/28/2019 11/18/2018 08/20/2017 08/19/2016 05/28/2015 02/20/2013  PHQ - 2 Score 0 0 0 0 1 0 0  PHQ- 9 Score 0 - 0 0 6 - -    Fall Risk Fall Risk  12/19/2019 11/28/2019 11/18/2018 08/20/2017 08/19/2016  Falls in the past year? 0 0 0 No No  Number falls in past yr: 0 0 0 - -  Injury with Fall? 0 0 0 - -  Risk for fall due to : Medication side effect - Medication side effect - -  Follow up Falls evaluation completed;Falls prevention discussed Falls evaluation completed Falls evaluation completed;Falls prevention discussed - -    FALL RISK PREVENTION PERTAINING TO THE HOME:  Any stairs in or around the home? Yes  If so, are there any without handrails? No  Home free of loose throw rugs in walkways, pet beds, electrical cords, etc? Yes  Adequate lighting in your home to reduce risk of falls? Yes   ASSISTIVE DEVICES UTILIZED TO PREVENT FALLS:  Life alert? No  Use of a cane, walker or w/c? No  Grab bars in  the bathroom? No  Shower chair or bench in shower? No  Elevated toilet seat or a handicapped toilet? No   TIMED UP AND GO:  Was the test performed? N/A, telephone visit.    Cognitive Function: MMSE - Mini Mental State Exam 12/19/2019 11/18/2018 08/20/2017 08/19/2016 05/28/2015  Not completed: Refused - - - -  Orientation to time - Orientation to Place - Registration - Attention/ Calculation - 5 0 0 0  Recall - Language- name 2 objects - - 0 0 0  Language- repeat - Language- follow 3 step command - - Language- read & follow direction - - 0 0 0  Write a sentence - - 0 0 0  Copy design - - 0 0 0  Total score - - Normal cognitive status assessed by direct observation by this Nurse Health Advisor. No abnormalities found.    Mini Cog  Mini-Cog screen was not completed. Patient does not feel the need to complete this. She has no memory issues. Maximum score is 22. A value of 0 denotes this part of the MMSE was not completed or the patient failed this part of the Mini-Cog screening.       Immunizations Immunization History  Administered Date(s)  Administered  . Fluad Quad(high Dose 65+) 10/11/2018, 10/21/2019  . Influenza Split 11/05/2010  . Influenza,inj,Quad PF,6+ Mos 10/13/2012, 10/12/2013, 10/12/2014, 10/10/2015, 10/22/2016, 09/23/2017  . Pneumococcal Conjugate-13 05/28/2015  . Pneumococcal Polysaccharide-23 09/05/2008  . Td 12/05/1997    TDAP status: Due, Education has been provided regarding the importance of this vaccine. Advised may receive this vaccine at local pharmacy or Health Dept. Aware to provide a copy of the vaccination record if obtained from local pharmacy or Health Dept. Verbalized acceptance and understanding.  Flu Vaccine status: Up to date  Pneumococcal vaccine status: Up to date  Covid-19 vaccine status: Declined, Education has been provided regarding the importance of this vaccine but patient  still declined. Advised may receive this vaccine at local pharmacy or Health Dept.or vaccine clinic. Aware to provide a copy of the vaccination record if obtained from local pharmacy or Health Dept. Verbalized acceptance and understanding.  Qualifies for Shingles Vaccine? Yes   Zostavax completed No   Shingrix Completed?: No.    Education has been provided regarding the importance of this vaccine. Patient has been advised to call insurance company to determine out of pocket expense if they have not yet received this vaccine. Advised may also receive vaccine at local pharmacy or Health Dept. Verbalized acceptance and understanding.  Screening Tests Health Maintenance  Topic Date Due  . Hepatitis C Screening  Never done  . COVID-19 Vaccine (1) 01/04/2020 (Originally 11/03/1952)  . TETANUS/TDAP  12/18/2024 (Originally 12/06/2007)  . INFLUENZA VACCINE  Completed  . DEXA SCAN  Completed  . PNA vac Low Risk Adult  Completed    Health Maintenance  Health Maintenance Due  Topic Date Due  . Hepatitis C Screening  Never done    Colorectal cancer screening: declined  Mammogram status: declined  Bone Density status: declined  Lung Cancer Screening: (Low Dose CT Chest recommended if Age 52-80 years, 30 pack-year currently smoking OR have quit w/in 15 years.) does not qualify.  Additional Screening:  Hepatitis C Screening: does qualify; Completed due  Vision Screening: Recommended annual ophthalmology exams for early detection of glaucoma and other disorders of the eye. Is the patient up to date with their annual eye exam?  Yes  Who is the provider or what is the name of the office in which the patient attends annual eye exams? Dr. Alinda Money If pt is not established with a provider, would they like to be referred to a provider to establish care? No .   Dental Screening: Recommended annual dental exams for proper oral hygiene  Community Resource Referral / Chronic Care Management: CRR  required this visit?  No   CCM required this visit?  No      Plan:     I have personally reviewed and noted the following in the patient's chart:   . Medical and social history . Use of alcohol, tobacco or illicit drugs  . Current medications and supplements . Functional ability and status . Nutritional status . Physical activity . Advanced directives . List of other physicians . Hospitalizations, surgeries, and ER visits in previous 12 months . Vitals . Screenings to include cognitive, depression, and falls . Referrals and appointments  In addition, I have reviewed and discussed with patient certain preventive protocols, quality metrics, and best practice recommendations. A written personalized care plan for preventive services as well as general preventive health recommendations were provided to patient.   Due to this being a telephonic visit, the after visit summary with patients personalized plan  was offered to patient via office or my-chart.  Patient preferred to pick up at office at next visit or via mychart.   Janalyn ShyJohnson, Vincenzo Stave, LPN   16/10/960412/14/2021

## 2020-01-17 ENCOUNTER — Ambulatory Visit (INDEPENDENT_AMBULATORY_CARE_PROVIDER_SITE_OTHER): Payer: Medicare HMO

## 2020-01-17 DIAGNOSIS — I441 Atrioventricular block, second degree: Secondary | ICD-10-CM | POA: Diagnosis not present

## 2020-01-19 LAB — CUP PACEART REMOTE DEVICE CHECK
Battery Impedance: 1118 Ohm
Battery Remaining Longevity: 54 mo
Battery Voltage: 2.77 V
Brady Statistic AP VP Percent: 39 %
Brady Statistic AP VS Percent: 0 %
Brady Statistic AS VP Percent: 61 %
Brady Statistic AS VS Percent: 0 %
Date Time Interrogation Session: 20220112080604
Implantable Lead Implant Date: 20131106
Implantable Lead Implant Date: 20131106
Implantable Lead Location: 753859
Implantable Lead Location: 753860
Implantable Lead Model: 5076
Implantable Lead Model: 5092
Implantable Pulse Generator Implant Date: 20131106
Lead Channel Impedance Value: 453 Ohm
Lead Channel Impedance Value: 586 Ohm
Lead Channel Pacing Threshold Amplitude: 0.5 V
Lead Channel Pacing Threshold Amplitude: 1.125 V
Lead Channel Pacing Threshold Pulse Width: 0.4 ms
Lead Channel Pacing Threshold Pulse Width: 0.4 ms
Lead Channel Setting Pacing Amplitude: 2 V
Lead Channel Setting Pacing Amplitude: 2.5 V
Lead Channel Setting Pacing Pulse Width: 0.4 ms
Lead Channel Setting Sensing Sensitivity: 2 mV

## 2020-01-31 NOTE — Progress Notes (Signed)
Remote pacemaker transmission.   

## 2020-02-28 ENCOUNTER — Other Ambulatory Visit (INDEPENDENT_AMBULATORY_CARE_PROVIDER_SITE_OTHER): Payer: Medicare HMO

## 2020-02-28 ENCOUNTER — Other Ambulatory Visit: Payer: Self-pay

## 2020-02-28 DIAGNOSIS — E039 Hypothyroidism, unspecified: Secondary | ICD-10-CM | POA: Diagnosis not present

## 2020-02-28 DIAGNOSIS — E559 Vitamin D deficiency, unspecified: Secondary | ICD-10-CM | POA: Diagnosis not present

## 2020-02-28 LAB — VITAMIN D 25 HYDROXY (VIT D DEFICIENCY, FRACTURES): VITD: 31.33 ng/mL (ref 30.00–100.00)

## 2020-02-28 LAB — TSH: TSH: 1.28 u[IU]/mL (ref 0.35–4.50)

## 2020-03-11 ENCOUNTER — Other Ambulatory Visit (HOSPITAL_COMMUNITY): Payer: Self-pay | Admitting: Interventional Radiology

## 2020-03-11 DIAGNOSIS — I771 Stricture of artery: Secondary | ICD-10-CM

## 2020-03-14 ENCOUNTER — Ambulatory Visit
Admission: RE | Admit: 2020-03-14 | Discharge: 2020-03-14 | Disposition: A | Discharge status: Home / Self Care | Payer: Medicare HMO | Source: Ambulatory Visit | Attending: Interventional Radiology | Admitting: Interventional Radiology

## 2020-03-14 ENCOUNTER — Other Ambulatory Visit (HOSPITAL_COMMUNITY): Payer: Self-pay | Admitting: Interventional Radiology

## 2020-03-14 ENCOUNTER — Other Ambulatory Visit: Payer: Self-pay

## 2020-03-14 DIAGNOSIS — I771 Stricture of artery: Secondary | ICD-10-CM

## 2020-03-14 DIAGNOSIS — I6623 Occlusion and stenosis of bilateral posterior cerebral arteries: Secondary | ICD-10-CM | POA: Diagnosis not present

## 2020-03-14 DIAGNOSIS — I6523 Occlusion and stenosis of bilateral carotid arteries: Secondary | ICD-10-CM | POA: Diagnosis not present

## 2020-03-14 DIAGNOSIS — R42 Dizziness and giddiness: Secondary | ICD-10-CM | POA: Diagnosis not present

## 2020-03-14 DIAGNOSIS — I672 Cerebral atherosclerosis: Secondary | ICD-10-CM | POA: Diagnosis not present

## 2020-03-14 LAB — POCT I-STAT CREATININE: Creatinine, Ser: 0.8 mg/dL (ref 0.44–1.00)

## 2020-03-14 MED ORDER — IOHEXOL 350 MG/ML SOLN
75.0000 mL | Freq: Once | INTRAVENOUS | Status: AC | PRN
  Administered 2020-03-14: 75 mL via INTRAVENOUS

## 2020-03-18 ENCOUNTER — Telehealth (HOSPITAL_COMMUNITY): Payer: Self-pay

## 2020-03-18 NOTE — Telephone Encounter (Signed)
Pt agreed to f/u in 2 years with cta head/neck. AW

## 2020-03-21 ENCOUNTER — Other Ambulatory Visit: Payer: Self-pay

## 2020-03-21 ENCOUNTER — Encounter: Payer: Self-pay | Admitting: Primary Care

## 2020-03-21 ENCOUNTER — Ambulatory Visit (INDEPENDENT_AMBULATORY_CARE_PROVIDER_SITE_OTHER): Payer: Medicare HMO | Admitting: Primary Care

## 2020-03-21 DIAGNOSIS — M25561 Pain in right knee: Secondary | ICD-10-CM

## 2020-03-21 DIAGNOSIS — M25569 Pain in unspecified knee: Secondary | ICD-10-CM | POA: Insufficient documentation

## 2020-03-21 NOTE — Assessment & Plan Note (Signed)
Suspect strain as there is no evidence of ligamental tear or instability. She ambulates well overall.   Discussed knee sleeve for compression and stability. Encouraged walking, ice or diclofenac if needed. She will update if her symptoms do not continue to improve.

## 2020-03-21 NOTE — Patient Instructions (Signed)
Try looking for a stretchy knee sleeve for your knee, this will aid in stability.  You can apply diclofenac (Voltaren) gel to the knee for pain if needed.  Be sure to remain active everyday.  It was a pleasure meeting you!

## 2020-03-21 NOTE — Progress Notes (Signed)
Subjective:    Patient ID: Kelsey Dunn, female    DOB: 18-Jul-1940, 81 y.o.   MRN: [id number]  HPI  Kelsey Dunn is a very pleasant 80 y.o. female patient of Dr. Para March with a history of stroke, fibromyalgia, osteopenia, vascular disease, back pain of who presents today with a chief complaint of knee pain.  Her pain is located to the right lateral knee (not patella) which began about two weeks ago after nearly falling. She was walking up three steps into her home, was holding onto the rail with her right arm, lost balance as she was carrying groceries, fell over and onto the rail of the left side of the stairs,  bruised her left anterior upper arm. She never fell to the ground, was able to catch herself from falling.   She believes that she twisted her knee as she attempted to stop her fall. She's noticed that her knee will "give way" at times when walking, turning the knee in certain ways when walking. Today she's noticed improvement, almost cancelled her appointment but would like some advice on a knee brace.    She denies swelling, falls. She is able to complete ADL's at home.    Review of Systems  Musculoskeletal: Positive for arthralgias.  Skin: Positive for color change.         Past Medical History:  Diagnosis Date  . Arthritis    "fingers" (2013-03-21)  . Basilar artery stenosis    90%  . Bladder spasms   . Blood transfusion 1960's   w/childbirth  . CAD (coronary artery disease)    a. 06/2009 cath: severe stenosis RCA with placement drug eluting stent;  b. 08/2011 cath: normal left main, luminal LAD irregularities, luminal LCx irregularities, luminal RCA irregularities and no ISR to mid-RCA DES, LVEF 60-65%; c. 02/2013 nonischemic CL, EF 79%.  . Family history of anesthesia complication    "first cousin had fever and almost died" (Mar 21, 2013)  . Fibromyalgia   . HLD (hyperlipidemia) 01/1997  . Hypertension   . Hypothyroidism 1990  . IC (interstitial cystitis)   .  Labile hypertension 08/1991  . Left-sided carotid artery obstruction   . Malignant hyperthermia    "told to warn everybody before I had surgery that my 1st cousin had malignant hyperthermia; I've had symptoms of it too" (2013-03-21)  . Obesity   . Pacemaker 11/11/2011   dual chamber pacemaker  . Pneumonia    "twice" (March 21, 2013)  . Second degree Mobitz II AV block   . TIA (transient ischemic attack)    multiple/notes 08/02/2015    Social History   Socioeconomic History  . Marital status: Married    Spouse name: Not on file  . Number of children: 1  . Years of education: Not on file  . Highest education level: Not on file  Occupational History  . Occupation: Bookkeeper-part time  . Occupation: retired    Associate Professor: RETIRED  Tobacco Use  . Smoking status: Never Smoker  . Smokeless tobacco: Never Used  Vaping Use  . Vaping Use: Never used  Substance and Sexual Activity  . Alcohol use: No    Alcohol/week: 0.0 standard drinks  . Drug use: No  . Sexual activity: Not Currently    Birth control/protection: Post-menopausal  Other Topics Concern  . 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   Widowed 2021.  Was married 1962; 1 child   Drinks coffee  and coke daily    Social Determinants of Health   Financial Resource Strain: Low Risk   . Difficulty of Paying Living Expenses: Not hard at all  Food Insecurity: No Food Insecurity  . Worried About Programme researcher, broadcasting/film/video in the Last Year: Never true  . Ran Out of Food in the Last Year: Never true  Transportation Needs: No Transportation Needs  . Lack of Transportation (Medical): No  . Lack of Transportation (Non-Medical): No  Physical Activity: Sufficiently Active  . Days of Exercise per Week: 7 days  . Minutes of Exercise per Session: 30 min  Stress: No Stress Concern Present  . Feeling of Stress : Not at all  Social Connections: Not on file  Intimate Partner  Violence: Not At Risk  . Fear of Current or Ex-Partner: No  . Emotionally Abused: No  . Physically Abused: No  . Sexually Abused: No    Past Surgical History:  Procedure Laterality Date  . basilar artery angioplasty  09/04/08   sm right brachial hematoma  . Bilateral Vert & Subclavian Angiogram  07/19/08   85-90% stenosis mid basilar art; occluded left vert artery; occluded left comm carotid artery w/ collat flow  . BLADDER SUSPENSION  1974  . CARDIAC CATHETERIZATION  1993   normal  . carotid arteriogram     bilat  . Carotid US  11/93   Occl L comm carotid  . Carotid US  4/98   L comm occl R-ok  . Carotid US  01/30/03   L occl  R less 40%  . CATARACT EXTRACTION W/ INTRAOCULAR LENS IMPLANT Bilateral 2007-2008  . Cath single vess dz  06/13/09   70% stenosis RCA EF 65-70%  . CHOLECYSTECTOMY  1985  . CORONARY ANGIOPLASTY WITH STENT PLACEMENT  06/19/09  . DEXA  10/15/04   Troch -0.4 o/w pos  . EMG  1/01   LE, RUE normal  . EYE SURGERY    . INSERT / REPLACE / REMOVE PACEMAKER  11/11/2011   MDT Adapta L implanted by Dr Johney Frame. [employer]  . LEFT HEART CATHETERIZATION WITH CORONARY ANGIOGRAM N/A 08/10/2011   Procedure: LEFT HEART CATHETERIZATION WITH CORONARY ANGIOGRAM;  Surgeon: Kathleene Hazel, MD;  Location: [facility] C[facility] LAB;  Service: Cardiovascular;  Laterality: N/A;  . LEFT HEART CATHETERIZATION WITH CORONARY ANGIOGRAM N/A 07/28/2013   Procedure: LEFT HEART CATHETERIZATION WITH CORONARY ANGIOGRAM;  Surgeon: Kathleene Hazel, MD;  Location: [facility] CATH LAB;  Service: Cardiovascular;  Laterality: N/A;  . PERMANENT PACEMAKER INSERTION N/A 11/11/2011   Procedure: PERMANENT PACEMAKER INSERTION;  Surgeon: Hillis Range, MD;  Location: [facility] CATH LAB;  Service: Cardiovascular;  Laterality: N/A;  . RENAL ANGIOPLASTY  1993   normal  . US RENAL/AORTA  12/93   Normal  . VAGINAL HYSTERECTOMY  1974   dysmennorhea  . Visual evoked response  11/93; 1/01   normal    Family History  Problem  Relation Age of Onset  . Lung cancer Father   . Heart attack Mother   . Lymphoma Mother        s/p chemo  . Stroke Mother        x 2  . Diabetes Brother   . Breast cancer Maternal Aunt   . Colon cancer Neg Hx     Allergies  Allergen Reactions  . Anesthetics, Amide Other (See Comments)    REACTION:FEVER, SYNCOPE  . Codeine Nausea And Vomiting and Other (See Comments)    Hallucinations  . Nitrofurantoin Nausea  Only and Other (See Comments)    Hallucinations  . Orphenadrine Citrate Nausea And Vomiting and Other (See Comments)     Hallucination  . Pravastatin Other (See Comments)    Myalgias  . Simvastatin Other (See Comments)    myalgias  . Ibuprofen     Would avoid if possible  . Pentosan Polysulfate Sodium Rash and Other (See Comments)     rash/ severe headache    Current Outpatient Medications on File Prior to Visit  Medication Sig Dispense Refill  . acetaminophen (TYLENOL) 500 MG tablet Take 1,000 mg by mouth every 6 (six) hours as needed for moderate pain.     Marland Kitchen amLODipine (NORVASC) 5 MG tablet Take 1 tablet (5 mg total) by mouth 2 (two) times daily. 180 tablet 3  . aspirin EC 81 MG tablet Take 1 tablet (81 mg total) by mouth daily. 90 tablet 3  . clopidogrel (PLAVIX) 75 MG tablet Take 1 tablet (75 mg total) by mouth daily. 90 tablet 3  . hydrochlorothiazide (HYDRODIURIL) 25 MG tablet Take 1 tablet (25 mg total) by mouth daily. 90 tablet 3  . levothyroxine (SYNTHROID) 100 MCG tablet Take 1 tablet (100 mcg total) by mouth daily. 90 tablet 3  . nitroGLYCERIN (NITROSTAT) 0.4 MG SL tablet Place 0.4 mg under the tongue every 5 (five) minutes as needed for chest pain (MAX 3 TABLETS).     Marland Kitchen olmesartan (BENICAR) 40 MG tablet Take 1 tablet (40 mg total) by mouth daily. 90 tablet 3  . Vitamin D, Cholecalciferol, 25 MCG (1000 UT) TABS Take 1,000 Units by mouth daily.     No current facility-administered medications on file prior to visit.    BP 136/78   Pulse 67   Temp (!)  95.6 F (35.3 C) (Temporal)   Ht 4\' 11"  (1.499 m)   Wt 150 lb (68 kg)   SpO2 98%   BMI 30.30 kg/m  Objective:   Physical Exam Cardiovascular:     Rate and Rhythm: Normal rate and regular rhythm.  Pulmonary:     Effort: Pulmonary effort is normal.     Breath sounds: Normal breath sounds.  Musculoskeletal:     Right knee: No swelling, erythema or bony tenderness. Normal range of motion. No tenderness.       Legs:     Comments: Ambulatory in office today without assistive device, was able to provoke pain to right medial side of knee, not patella, with twisting movement. Stable. Able to get up to exam tablet without difficulty.   Skin:    General: Skin is warm and dry.     Findings: Bruising present. No erythema.     Comments: Yellow bruising to left medial humeral arm, healing.   Neurological:     Mental Status: She is alert.           Assessment & Plan:      This visit occurred during the SARS-CoV-2 public health emergency.  Safety protocols were in place, including screening questions prior to the visit, additional usage of staff PPE, and extensive cleaning of exam room while observing appropriate contact time as indicated for disinfecting solutions.

## 2020-04-17 ENCOUNTER — Ambulatory Visit (INDEPENDENT_AMBULATORY_CARE_PROVIDER_SITE_OTHER): Payer: Medicare HMO

## 2020-04-17 DIAGNOSIS — I441 Atrioventricular block, second degree: Secondary | ICD-10-CM

## 2020-04-19 LAB — CUP PACEART REMOTE DEVICE CHECK
Battery Impedance: 1363 Ohm
Battery Remaining Longevity: 44 mo
Battery Voltage: 2.76 V
Brady Statistic AP VP Percent: 39 %
Brady Statistic AP VS Percent: 0 %
Brady Statistic AS VP Percent: 61 %
Brady Statistic AS VS Percent: 0 %
Date Time Interrogation Session: 20220413083804
Implantable Lead Implant Date: 20131106
Implantable Lead Implant Date: 20131106
Implantable Lead Location: 753859
Implantable Lead Location: 753860
Implantable Lead Model: 5076
Implantable Lead Model: 5092
Implantable Pulse Generator Implant Date: 20131106
Lead Channel Impedance Value: 402 Ohm
Lead Channel Impedance Value: 566 Ohm
Lead Channel Pacing Threshold Amplitude: 0.5 V
Lead Channel Pacing Threshold Amplitude: 1.375 V
Lead Channel Pacing Threshold Pulse Width: 0.4 ms
Lead Channel Pacing Threshold Pulse Width: 0.4 ms
Lead Channel Setting Pacing Amplitude: 2 V
Lead Channel Setting Pacing Amplitude: 2.75 V
Lead Channel Setting Pacing Pulse Width: 0.4 ms
Lead Channel Setting Sensing Sensitivity: 2 mV

## 2020-04-25 ENCOUNTER — Other Ambulatory Visit: Payer: Self-pay | Admitting: Family Medicine

## 2020-04-25 MED ORDER — NITROGLYCERIN 0.4 MG SL SUBL: 0.4000 mg | SUBLINGUAL_TABLET | SUBLINGUAL | 1 refills | Status: DC | PRN

## 2020-04-25 NOTE — Telephone Encounter (Signed)
Sent. If needed, then needs to seek eval.  Thanks.

## 2020-04-25 NOTE — Telephone Encounter (Signed)
  LAST APPOINTMENT DATE: 03/21/2020   NEXT APPOINTMENT DATE:@Visit  date not found  MEDICATION: nitroglycerin  PHARMACY: [facility] [address]  Let patient know to contact pharmacy at the end of the day to make sure medication is ready.  Please notify patient to allow 48-72 hours to process  Encourage patient to contact the pharmacy for refills or they can request refills through [facility]  CLINICAL FILLS OUT ALL BELOW:   LAST REFILL:  QTY:  REFILL DATE:    OTHER COMMENTS:    Okay for refill?  Please advise

## 2020-04-25 NOTE — Telephone Encounter (Signed)
Patient requesting refill on nitroglycerin; you have never prescribed for patient. Okay to send?

## 2020-04-26 NOTE — Telephone Encounter (Signed)
Spoke with patient and she states everything is okay. Patient states she just needed to have refilled to have at home just in case she needs to take them. Patient is scheduled to have an echocardiogram done on 05/06/20.

## 2020-04-26 NOTE — Telephone Encounter (Signed)
LMTCB

## 2020-05-01 NOTE — Progress Notes (Signed)
Remote pacemaker transmission.   

## 2020-05-09 ENCOUNTER — Other Ambulatory Visit: Payer: Self-pay

## 2020-05-09 ENCOUNTER — Ambulatory Visit (HOSPITAL_COMMUNITY): Payer: Medicare HMO | Attending: Cardiovascular Disease

## 2020-05-09 DIAGNOSIS — I350 Nonrheumatic aortic (valve) stenosis: Secondary | ICD-10-CM | POA: Diagnosis not present

## 2020-05-09 DIAGNOSIS — I340 Nonrheumatic mitral (valve) insufficiency: Secondary | ICD-10-CM | POA: Insufficient documentation

## 2020-05-09 LAB — ECHOCARDIOGRAM COMPLETE
AR max vel: 1.28 cm2
AV Area VTI: 1.42 cm2
AV Area mean vel: 1.18 cm2
AV Mean grad: 20.7 mmHg
AV Peak grad: 37.5 mmHg
Ao pk vel: 3.06 m/s
Area-P 1/2: 5.02 cm2
S' Lateral: 2.7 cm

## 2020-05-20 DIAGNOSIS — H43813 Vitreous degeneration, bilateral: Secondary | ICD-10-CM | POA: Diagnosis not present

## 2020-07-17 ENCOUNTER — Ambulatory Visit (INDEPENDENT_AMBULATORY_CARE_PROVIDER_SITE_OTHER): Payer: Medicare HMO

## 2020-07-17 DIAGNOSIS — I441 Atrioventricular block, second degree: Secondary | ICD-10-CM

## 2020-07-18 LAB — CUP PACEART REMOTE DEVICE CHECK
Battery Impedance: 1390 Ohm
Battery Remaining Longevity: 46 mo
Battery Voltage: 2.76 V
Brady Statistic AP VP Percent: 39 %
Brady Statistic AP VS Percent: 0 %
Brady Statistic AS VP Percent: 61 %
Brady Statistic AS VS Percent: 0 %
Date Time Interrogation Session: 20220713081422
Implantable Lead Implant Date: 20131106
Implantable Lead Implant Date: 20131106
Implantable Lead Location: 753859
Implantable Lead Location: 753860
Implantable Lead Model: 5076
Implantable Lead Model: 5092
Implantable Pulse Generator Implant Date: 20131106
Lead Channel Impedance Value: 408 Ohm
Lead Channel Impedance Value: 578 Ohm
Lead Channel Pacing Threshold Amplitude: 0.5 V
Lead Channel Pacing Threshold Amplitude: 1.125 V
Lead Channel Pacing Threshold Pulse Width: 0.4 ms
Lead Channel Pacing Threshold Pulse Width: 0.4 ms
Lead Channel Setting Pacing Amplitude: 2 V
Lead Channel Setting Pacing Amplitude: 2.5 V
Lead Channel Setting Pacing Pulse Width: 0.4 ms
Lead Channel Setting Sensing Sensitivity: 2 mV

## 2020-08-08 NOTE — Progress Notes (Signed)
Remote pacemaker transmission.   

## 2020-08-30 ENCOUNTER — Telehealth: Payer: Self-pay | Admitting: Cardiovascular Disease

## 2020-08-30 ENCOUNTER — Telehealth: Payer: Self-pay

## 2020-08-30 NOTE — Telephone Encounter (Signed)
I spoke with the patient and let her know that we got her transmission. I told her it was okay for her to bring her old monitor and I will send it back to [employer] for her. The patient verbalized understanding and thanked me for my help.

## 2020-09-05 NOTE — Progress Notes (Signed)
Chief Complaint  Patient presents with   Follow-up    CAD    History of Present Illness: 80 yo female with h/o CAD, HTN, hyperlipidemia, hypothyroidism, AV block s/p PPM, CVA and basilar artery stenosis, mitral regurgitation and aortic stenosis here today for follow up. Left heart cath on 06/13/09 and she was found to have a severe stenosis in the RCA. There was minor disease in the LAD and Circumflex. A drug eluting stent was placed in the RCA. Repeat cardiac cath in August 2013 She was admitted to [facility] 08/10/11 with dizziness, chest pain and repeat cath showed stable disease. She was observed to have second degree AV block demonstrated with RBBB. Her coreg was stopped. She was discharged and wore an event monitor. This did not document arrhythmias or advanced AV block. There were no episodes of AV block documented. GXT was performed in our office 09/01/11. She was observed during the study to have a LBBB with exertion. She developed exertional symptoms of fatigue, SOB, and CP which corresponded to her LBBB. She was seen by Dr. Johney Frame and continued to have episodes of palpitations and dizziness with moderate activity. A permanent pacemaker was placed on 11/11/11 by Dr. Johney Frame. She has been undergoing cerebral angiograms for basilar artery stenosis and has had angiplasty of the basilar artery in August 2010. She is known to have an occluded left common carotid artery. She had a CVA in December 2012. She was Admitted to [facility] July 2015 with chest pain. Cardiac cath in 2015 with stable CAD. Echo May 2022 with LVEF=60-65%. Grade 1 diastolic dysfunction. Moderate aortic valve stenosis (mean gradient 20.7 mmHg). Mild to moderate MR.   She is here today for follow up. The patient denies any chest pain, dyspnea, palpitations, lower extremity edema, orthopnea, PND, dizziness, near syncope or syncope.    Primary Care Physician: Joaquim Nam, MD  Past Medical History:  Diagnosis Date   Arthritis     "fingers" (03/13/13)   Basilar artery stenosis    90%   Bladder spasms    Blood transfusion 1960's   w/childbirth   CAD (coronary artery disease)    a. 06/2009 cath: severe stenosis RCA with placement drug eluting stent;  b. 08/2011 cath: normal left main, luminal LAD irregularities, luminal LCx irregularities, luminal RCA irregularities and no ISR to mid-RCA DES, LVEF 60-65%; c. 02/2013 nonischemic CL, EF 79%.   Family history of anesthesia complication    "first cousin had fever and almost died" (03/13/13)   Fibromyalgia    HLD (hyperlipidemia) 01/1997   Hypertension    Hypothyroidism 1990   IC (interstitial cystitis)    Labile hypertension 08/1991   Left-sided carotid artery obstruction    Malignant hyperthermia    "told to warn everybody before I had surgery that my 1st cousin had malignant hyperthermia; I've had symptoms of it too" (03/13/2013)   Obesity    Pacemaker 11/11/2011   dual chamber pacemaker   Pneumonia    "twice" (03/13/13)   Second degree Mobitz II AV block    TIA (transient ischemic attack)    multiple/notes 08/02/2015    Past Surgical History:  Procedure Laterality Date   basilar artery angioplasty  09/04/08   sm right brachial hematoma   Bilateral Vert & Subclavian Angiogram  07/19/08   85-90% stenosis mid basilar art; occluded left vert artery; occluded left comm carotid artery w/ collat flow   BLADDER SUSPENSION  1974   CARDIAC CATHETERIZATION  1993  normal   carotid arteriogram     bilat   Carotid US  11/93   Occl L comm carotid   Carotid US  4/98   L comm occl R-ok   Carotid US  01/30/03   L occl  R less 40%   CATARACT EXTRACTION W/ INTRAOCULAR LENS IMPLANT Bilateral 2007-2008   Cath single vess dz  06/13/09   70% stenosis RCA EF 65-70%   CHOLECYSTECTOMY  1985   CORONARY ANGIOPLASTY WITH STENT PLACEMENT  06/19/09   DEXA  10/15/04   Troch -0.4 o/w pos   EMG  1/01   LE, RUE normal   EYE SURGERY     INSERT / REPLACE / REMOVE PACEMAKER  11/11/2011    MDT Adapta L implanted by Dr Johney Frame. [employer]   LEFT HEART CATHETERIZATION WITH CORONARY ANGIOGRAM N/A 08/10/2011   Procedure: LEFT HEART CATHETERIZATION WITH CORONARY ANGIOGRAM;  Surgeon: Kathleene Hazel, MD;  Location: [facility] CATH LAB;  Service: Cardiovascular;  Laterality: N/A;   LEFT HEART CATHETERIZATION WITH CORONARY ANGIOGRAM N/A 07/28/2013   Procedure: LEFT HEART CATHETERIZATION WITH CORONARY ANGIOGRAM;  Surgeon: Kathleene Hazel, MD;  Location: [facility] CATH LAB;  Service: Cardiovascular;  Laterality: N/A;   PERMANENT PACEMAKER INSERTION N/A 11/11/2011   Procedure: PERMANENT PACEMAKER INSERTION;  Surgeon: Hillis Range, MD;  Location: [facility] CATH LAB;  Service: Cardiovascular;  Laterality: N/A;   RENAL ANGIOPLASTY  1993   normal   US RENAL/AORTA  12/93   Normal   VAGINAL HYSTERECTOMY  1974   dysmennorhea   Visual evoked response  11/93; 1/01   normal    Current Outpatient Medications  Medication Sig Dispense Refill   acetaminophen (TYLENOL) 500 MG tablet Take 1,000 mg by mouth every 6 (six) hours as needed for moderate pain.      amLODipine (NORVASC) 5 MG tablet Take 1 tablet (5 mg total) by mouth 2 (two) times daily. 180 tablet 3   aspirin EC 81 MG tablet Take 1 tablet (81 mg total) by mouth daily. 90 tablet 3   clopidogrel (PLAVIX) 75 MG tablet Take 1 tablet (75 mg total) by mouth daily. 90 tablet 3   hydrochlorothiazide (HYDRODIURIL) 25 MG tablet Take 1 tablet (25 mg total) by mouth daily. 90 tablet 3   levothyroxine (SYNTHROID) 100 MCG tablet Take 1 tablet (100 mcg total) by mouth daily. 90 tablet 3   nitroGLYCERIN (NITROSTAT) 0.4 MG SL tablet Place 1 tablet (0.4 mg total) under the tongue every 5 (five) minutes as needed for chest pain (MAX 3 TABLETS). 25 tablet 1   olmesartan (BENICAR) 40 MG tablet Take 1 tablet (40 mg total) by mouth daily. 90 tablet 3   Vitamin D, Cholecalciferol, 25 MCG (1000 UT) TABS Take 1,000 Units by mouth daily.     No current facility-administered  medications for this visit.    Allergies  Allergen Reactions   Anesthetics, Amide Other (See Comments)    REACTION:FEVER, SYNCOPE   Codeine Nausea And Vomiting and Other (See Comments)    Hallucinations   Nitrofurantoin Nausea Only and Other (See Comments)    Hallucinations   Orphenadrine Citrate Nausea And Vomiting and Other (See Comments)     Hallucination   Pravastatin Other (See Comments)    Myalgias   Simvastatin Other (See Comments)    myalgias   Ibuprofen     Would avoid if possible   Pentosan Polysulfate Sodium Rash and Other (See Comments)     rash/ severe headache    Social  History   Socioeconomic History   Marital status: Married    Spouse name: Not on file   Number of children: 1   Years of education: Not on file   Highest education level: Not on file  Occupational History   Occupation: Bookkeeper-part time   Occupation: retired    Associate Professor: RETIRED  Tobacco Use   Smoking status: Never   Smokeless tobacco: Never  Vaping Use   Vaping Use: Never used  Substance and Sexual Activity   Alcohol use: No    Alcohol/week: 0.0 standard drinks   Drug use: No   Sexual activity: Not Currently    Birth control/protection: Post-menopausal  Other Topics Concern   Not on file  Social History Narrative   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   Widowed 2021.  Was married 1962; 1 child   Drinks coffee and coke daily    Social Determinants of Corporate investment banker Strain: Low Risk    Difficulty of Paying Living Expenses: Not hard at all  Food Insecurity: No Food Insecurity   Worried About Programme researcher, broadcasting/film/video in the Last Year: Never true   Barista in the Last Year: Never true  Transportation Needs: No Transportation Needs   Lack of Transportation (Medical): No   Lack of Transportation (Non-Medical): No  Physical Activity: Sufficiently Active   Days of Exercise per Week: 7 days   Minutes of Exercise per Session: 30 min   Stress: No Stress Concern Present   Feeling of Stress : Not at all  Social Connections: Not on file  Intimate Partner Violence: Not At Risk   Fear of Current or Ex-Partner: No   Emotionally Abused: No   Physically Abused: No   Sexually Abused: No    Family History  Problem Relation Age of Onset   Lung cancer Father    Heart attack Mother    Lymphoma Mother        s/p chemo   Stroke Mother        x 2   Diabetes Brother    Breast cancer Maternal Aunt    Colon cancer Neg Hx     Review of Systems:  As stated in the HPI and otherwise negative.   BP 132/60   Pulse 71   Ht 4\' 11"  (1.499 m)   Wt 148 lb 12.8 oz (67.5 kg)   SpO2 96%   BMI 30.05 kg/m   Physical Examination: General: Well developed, well nourished, NAD  HEENT: OP clear, mucus membranes moist  SKIN: warm, dry. No rashes. Neuro: No focal deficits  Musculoskeletal: Muscle strength 5/5 all ext  Psychiatric: Mood and affect normal  Neck: No JVD, no carotid bruits, no thyromegaly, no lymphadenopathy.  Lungs:Clear bilaterally, no wheezes, rhonci, crackles Cardiovascular: Regular rate and rhythm. Systolic murmur.  Abdomen:Soft. Bowel sounds present. Non-tender.  Extremities: No lower extremity edema. Pulses are 2 + in the bilateral DP/PT.  Cardiac cath 07/27/13: Left main: No obstructive disease.  Left Anterior Descending Artery: Large caliber vessel that courses to the apex. The mid vessel has diffuse 20% stenosis. There is a very small caliber diagonal branch with proximal 40% stenosis. There are no flow limiting lesions noted.  Circumflex Artery: Moderate caliber vessel with moderate caliber obtuse marginal branch. No obstructive disease.  Right Coronary Artery: Large caliber vessel with 20% mid stenosis, patent distal stent without restenosis.  Left Ventricular Angiogram: LVEF=65-70%.  Impression:  1. Single vessel CAD with  patent stent RCA  2. Normal LV function  3. Non-cardiac chest pain   Echo 05/09/20:   1. Left ventricular ejection fraction, by estimation, is 60 to 65%. Left  ventricular ejection fraction by 3D volume is 63 %. The left ventricle has  normal function. The left ventricle has no regional wall motion  abnormalities. There is mild left  ventricular hypertrophy of the basal-septal segment. Left ventricular  diastolic parameters are consistent with Grade I diastolic dysfunction  (impaired relaxation). The average left ventricular global longitudinal  strain is -20.1 %. The global  longitudinal strain is normal.   2. Right ventricular systolic function is normal. The right ventricular  size is normal. There is normal pulmonary artery systolic pressure.   3. Left atrial size was mildly dilated.   4. The mitral valve is normal in structure. Mild to moderate mitral valve  regurgitation. No evidence of mitral stenosis.   5. The aortic valve is normal in structure. There is moderate  calcification of the aortic valve. There is moderate thickening of the  aortic valve. Aortic valve regurgitation is not visualized. Moderate  aortic valve stenosis. Aortic valve mean gradient  measures 20.7 mmHg. Aortic valve Vmax measures 3.06 m/s.   6. The inferior vena cava is normal in size with greater than 50%  respiratory variability, suggesting right atrial pressure of 3 mmHg.   EKG:  EKG is ordered today. The ekg ordered today demonstrates paced rhythm  Recent Labs: 11/21/2019: ALT 13; BUN 15; Potassium 4.1; Sodium 140 02/28/2020: TSH 1.28 03/14/2020: Creatinine, Ser 0.80   Lipid Panel    Component Value Date/Time   CHOL 194 11/21/2019 0833   TRIG 171.0 (H) 11/21/2019 0833   HDL 43.40 11/21/2019 0833   CHOLHDL 4 11/21/2019 0833   VLDL 34.2 11/21/2019 0833   LDLCALC 116 (H) 11/21/2019 0833   LDLDIRECT 145.0 10/01/2014 1336     Wt Readings from Last 3 Encounters:  09/06/20 148 lb 12.8 oz (67.5 kg)  03/21/20 150 lb (68 kg)  11/28/19 148 lb 12.8 oz (67.5 kg)     Other studies  Reviewed: Additional studies/ records that were reviewed today include: . Review of the above records demonstrates:    Assessment and Plan:   1. CAD without angina: Last cath in July 2015 with stable CAD. Will continue ASA and Plavix given prior CVA and history of coronary stent.  She does not tolerate statins.   2. High grade symptomatic AV block: s/p PPM 11/10/11 per Dr. Johney FrameAllred. She is followed in the pacer clinic.   3. HTN: BP is well controlled. No changes  4. Chronic diastolic CHF: Weight is stable. No volume overload on exam.   5. Aortic valve stenosis: Moderate by echo May 2022. Repeat echo May 2023.   6. Mitral valve regurgitation: Mild to moderate by echo in May 2022  Current medicines are reviewed at length with the patient today.  The patient does not have concerns regarding medicines.  The following changes have been made:  no change  Labs/ tests ordered today include:   Orders Placed This Encounter  Procedures   EKG 12-Lead   ECHOCARDIOGRAM COMPLETE     Disposition:   F/U with me in 12  months  Signed, Verne Carrowhristopher Serenna Deroy, MD 09/06/2020 11:51 AM    [facility] [address], [place], [place]  [address] Phone: [phone]; Fax: [phone]

## 2020-09-06 ENCOUNTER — Encounter: Payer: Self-pay | Admitting: Cardiovascular Disease

## 2020-09-06 ENCOUNTER — Other Ambulatory Visit: Payer: Self-pay

## 2020-09-06 ENCOUNTER — Ambulatory Visit: Payer: Medicare HMO | Admitting: Cardiovascular Disease

## 2020-09-06 VITALS — BP 132/60 | HR 71 | Ht 59.0 in | Wt 148.8 lb

## 2020-09-06 DIAGNOSIS — I10 Essential (primary) hypertension: Secondary | ICD-10-CM | POA: Diagnosis not present

## 2020-09-06 DIAGNOSIS — I2510 Atherosclerotic heart disease of native coronary artery without angina pectoris: Secondary | ICD-10-CM | POA: Diagnosis not present

## 2020-09-06 DIAGNOSIS — I340 Nonrheumatic mitral (valve) insufficiency: Secondary | ICD-10-CM | POA: Diagnosis not present

## 2020-09-06 DIAGNOSIS — I441 Atrioventricular block, second degree: Secondary | ICD-10-CM | POA: Diagnosis not present

## 2020-09-06 DIAGNOSIS — I5032 Chronic diastolic (congestive) heart failure: Secondary | ICD-10-CM | POA: Diagnosis not present

## 2020-09-06 DIAGNOSIS — I350 Nonrheumatic aortic (valve) stenosis: Secondary | ICD-10-CM

## 2020-09-06 NOTE — Patient Instructions (Addendum)
Medication Instructions:  No changes *If you need a refill on your cardiac medications before your next appointment, please call your pharmacy*   Lab Work: none If you have labs (blood work) drawn today and your tests are completely normal, you will receive your results only by: MyChart Message (if you have MyChart) OR A paper copy in the mail If you have any lab test that is abnormal or we need to change your treatment, we will call you to review the results.   Testing/Procedures: Other Instructions ECHO DUE May 2023  Your physician has requested that you have an echocardiogram. Echocardiography is a painless test that uses sound waves to create images of your heart. It provides your doctor with information about the size and shape of your heart and how well your heart's chambers and valves are working. This procedure takes approximately one hour. There are no restrictions for this procedure.   Follow-Up: At [facility], you and your health needs are our priority.  As part of our continuing mission to provide you with exceptional heart care, we have created designated Provider Care Teams.  These Care Teams include your primary Cardiologist (physician) and Advanced Practice Providers (APPs -  Physician Assistants and Nurse Practitioners) who all work together to provide you with the care you need, when you need it.  We recommend signing up for the patient portal called "MyChart".  Sign up information is provided on this After Visit Summary.  MyChart is used to connect with patients for Virtual Visits (Telemedicine).  Patients are able to view lab/test results, encounter notes, upcoming appointments, etc.  Non-urgent messages can be sent to your provider as well.   To learn more about what you can do with MyChart, go to [email or web address].    Your next appointment:   12 month(s)  The format for your next appointment:   In Person  Provider:   You may see Verne Carrow, MD  or one of the following Advanced Practice Providers on your designated Care Team:   Ronie Spies, PA-C Jacolyn Reedy, PA-C

## 2020-10-16 ENCOUNTER — Ambulatory Visit (INDEPENDENT_AMBULATORY_CARE_PROVIDER_SITE_OTHER): Payer: Medicare HMO

## 2020-10-16 DIAGNOSIS — I441 Atrioventricular block, second degree: Secondary | ICD-10-CM

## 2020-10-18 LAB — CUP PACEART REMOTE DEVICE CHECK
Battery Impedance: 1584 Ohm
Battery Remaining Longevity: 42 mo
Battery Voltage: 2.76 V
Brady Statistic AP VP Percent: 39 %
Brady Statistic AP VS Percent: 0 %
Brady Statistic AS VP Percent: 61 %
Brady Statistic AS VS Percent: 0 %
Date Time Interrogation Session: 20221012075902
Implantable Lead Implant Date: 20131106
Implantable Lead Implant Date: 20131106
Implantable Lead Location: 753859
Implantable Lead Location: 753860
Implantable Lead Model: 5076
Implantable Lead Model: 5092
Implantable Pulse Generator Implant Date: 20131106
Lead Channel Impedance Value: 455 Ohm
Lead Channel Impedance Value: 601 Ohm
Lead Channel Pacing Threshold Amplitude: 0.375 V
Lead Channel Pacing Threshold Amplitude: 1.125 V
Lead Channel Pacing Threshold Pulse Width: 0.4 ms
Lead Channel Pacing Threshold Pulse Width: 0.4 ms
Lead Channel Setting Pacing Amplitude: 2 V
Lead Channel Setting Pacing Amplitude: 2.5 V
Lead Channel Setting Pacing Pulse Width: 0.4 ms
Lead Channel Setting Sensing Sensitivity: 2 mV

## 2020-10-24 NOTE — Progress Notes (Signed)
Remote pacemaker transmission.   

## 2020-11-08 ENCOUNTER — Other Ambulatory Visit: Payer: Self-pay

## 2020-11-08 ENCOUNTER — Ambulatory Visit (INDEPENDENT_AMBULATORY_CARE_PROVIDER_SITE_OTHER): Payer: Medicare HMO

## 2020-11-08 DIAGNOSIS — Z23 Encounter for immunization: Secondary | ICD-10-CM

## 2020-11-29 ENCOUNTER — Other Ambulatory Visit: Payer: Self-pay | Admitting: Family Medicine

## 2020-12-18 NOTE — Progress Notes (Signed)
Subjective:   Kelsey Dunn is a 80 y.o. female who presents for Medicare Annual (Subsequent) preventive examination.  I connected with Kelsey Dunn today by telephone and verified that I am speaking with the correct person using two identifiers. Location patient: home Location provider: work Persons participating in the virtual visit: patient, Engineer, civil (consulting).    I discussed the limitations, risks, security and privacy concerns of performing an evaluation and management service by telephone and the availability of in person appointments. I also discussed with the patient that there may be a patient responsible charge related to this service. The patient expressed understanding and verbally consented to this telephonic visit.    Interactive audio and video telecommunications were attempted between this provider and patient, however failed, due to patient having technical difficulties OR patient did not have access to video capability.  We continued and completed visit with audio only.  Some vital signs may be absent or patient reported.   Time Spent with patient on telephone encounter: 25 minutes  Review of Systems     Cardiac Risk Factors include: advanced age (>28men, >32 women);hypertension;dyslipidemia     Objective:    Today's Vitals   12/20/20 1113  Weight: 142 lb (64.4 kg)  Height: 4\' 11"  (1.499 m)   Body mass index is 28.68 kg/m.  Advanced Directives 12/20/2020 12/19/2019 11/18/2018 08/20/2017 08/19/2016 03/08/2016 08/02/2015  Does Patient Have a Medical Advance Directive? Yes Yes No No No No;Yes No  Type of Advance Directive Living will Healthcare Power of [place];Living will - - - Healthcare Power of Attorney -  Does patient want to make changes to medical advance directive? Yes (MAU/Ambulatory/Procedural Areas - Information given) - - - - - -  Copy of Healthcare Power of Attorney in Chart? - No - copy requested - - - - -  Would patient like information on creating a medical  advance directive? Yes (MAU/Ambulatory/Procedural Areas - Information given) - No - Patient declined No - Patient declined - - No - patient declined information  Pre-existing out of facility DNR order (yellow form or pink MOST form) - - - - - - -    Current Medications (verified) Outpatient Encounter Medications as of 12/20/2020  Medication Sig   acetaminophen (TYLENOL) 500 MG tablet Take 1,000 mg by mouth every 6 (six) hours as needed for moderate pain.    amLODipine (NORVASC) 5 MG tablet TAKE 1 TABLET BY MOUTH TWICE A DAY   clopidogrel (PLAVIX) 75 MG tablet TAKE 1 TABLET BY MOUTH EVERY DAY   hydrochlorothiazide (HYDRODIURIL) 25 MG tablet TAKE 1 TABLET BY MOUTH EVERY DAY   levothyroxine (SYNTHROID) 100 MCG tablet TAKE 1 TABLET BY MOUTH EVERY DAY   nitroGLYCERIN (NITROSTAT) 0.4 MG SL tablet Place 1 tablet (0.4 mg total) under the tongue every 5 (five) minutes as needed for chest pain (MAX 3 TABLETS).   olmesartan (BENICAR) 40 MG tablet TAKE 1 TABLET BY MOUTH EVERY DAY   Vitamin D, Cholecalciferol, 25 MCG (1000 UT) TABS Take 1,000 Units by mouth daily.   aspirin EC 81 MG tablet Take 1 tablet (81 mg total) by mouth daily. (Patient not taking: Reported on 12/20/2020)   No facility-administered encounter medications on file as of 12/20/2020.    Allergies (verified) Anesthetics, amide; Codeine; Nitrofurantoin; Orphenadrine citrate; Pravastatin; Simvastatin; Ibuprofen; and Pentosan polysulfate sodium   History: Past Medical History:  Diagnosis Date   Arthritis    "fingers" (02/27/2013)   Basilar artery stenosis    90%   Bladder  spasms    Blood transfusion 1960's   w/childbirth   CAD (coronary artery disease)    a. 06/2009 cath: severe stenosis RCA with placement drug eluting stent;  b. 08/2011 cath: normal left main, luminal LAD irregularities, luminal LCx irregularities, luminal RCA irregularities and no ISR to mid-RCA DES, LVEF 60-65%; c. 02/2013 nonischemic CL, EF 79%.   Family history  of anesthesia complication    "first cousin had fever and almost died" (Feb 28, 2013)   Fibromyalgia    HLD (hyperlipidemia) 01/1997   Hypertension    Hypothyroidism 1990   IC (interstitial cystitis)    Labile hypertension 08/1991   Left-sided carotid artery obstruction    Malignant hyperthermia    "told to warn everybody before I had surgery that my 1st cousin had malignant hyperthermia; I've had symptoms of it too" (02/28/13)   Obesity    Pacemaker 11/11/2011   dual chamber pacemaker   Pneumonia    "twice" (02-28-13)   Second degree Mobitz II AV block    TIA (transient ischemic attack)    multiple/notes 08/02/2015   Past Surgical History:  Procedure Laterality Date   basilar artery angioplasty  09/04/08   sm right brachial hematoma   Bilateral Vert & Subclavian Angiogram  07/19/08   85-90% stenosis mid basilar art; occluded left vert artery; occluded left comm carotid artery w/ collat flow   BLADDER SUSPENSION  1974   CARDIAC CATHETERIZATION  1993   normal   carotid arteriogram     bilat   Carotid US  11/93   Occl L comm carotid   Carotid US  4/98   L comm occl R-ok   Carotid US  01/30/03   L occl  R less 40%   CATARACT EXTRACTION W/ INTRAOCULAR LENS IMPLANT Bilateral 2007-2008   Cath single vess dz  06/13/09   70% stenosis RCA EF 65-70%   CHOLECYSTECTOMY  1985   CORONARY ANGIOPLASTY WITH STENT PLACEMENT  06/19/09   DEXA  10/15/04   Troch -0.4 o/w pos   EMG  1/01   LE, RUE normal   EYE SURGERY     INSERT / REPLACE / REMOVE PACEMAKER  11/11/2011   MDT Adapta L implanted by Dr Johney Frame. [employer]   LEFT HEART CATHETERIZATION WITH CORONARY ANGIOGRAM N/A 08/10/2011   Procedure: LEFT HEART CATHETERIZATION WITH CORONARY ANGIOGRAM;  Surgeon: Kathleene Hazel, MD;  Location[facility] CATH LAB;  Service: Cardiovascular;  Laterality: N/A;   LEFT HEART CATHETERIZATION WITH CORONARY ANGIOGRAM N/A 07/28/2013   Procedure: LEFT HEART CATHETERIZATION WITH CORONARY ANGIOGRAM;  Surgeon:  Kathleene Hazel, MD;  Location: [facility] CATH LAB;  Service: Cardiovascular;  Laterality: N/A;   PERMANENT PACEMAKER INSERTION N/A 11/11/2011   Procedure: PERMANENT PACEMAKER INSERTION;  Surgeon: Hillis Range, MD;  Location: [facility] C[facility] LAB;  Service: Cardiovascular;  Laterality: N/A;   RENAL ANGIOPLASTY  1993   normal   US RENAL/AORTA  12/93   Normal   VAGINAL HYSTERECTOMY  1974   dysmennorhea   Visual evoked response  11/93; 1/01   normal   Family History  Problem Relation Age of Onset   Lung cancer Father    Heart attack Mother    Lymphoma Mother        s/p chemo   Stroke Mother        x 2   Diabetes Brother    Breast cancer Maternal Aunt    Colon cancer Neg Hx    Social History   Socioeconomic History   Marital  status: Widowed    Spouse name: Not on file   Number of children: 1   Years of education: Not on file   Highest education level: Not on file  Occupational History   Occupation: Bookkeeper-part time   Occupation: retired    Associate Professor: RETIRED  Tobacco Use   Smoking status: Never   Smokeless tobacco: Never  Vaping Use   Vaping Use: Never used  Substance and Sexual Activity   Alcohol use: No    Alcohol/week: 0.0 standard drinks   Drug use: No   Sexual activity: Not Currently    Birth control/protection: Post-menopausal  Other Topics Concern   Not on file  Social History Narrative   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   Widowed 2021.  Was married 1962; 1 child   Drinks coffee and coke daily    Social Determinants of Corporate investment banker Strain: Low Risk    Difficulty of Paying Living Expenses: Not hard at all  Food Insecurity: No Food Insecurity   Worried About Programme researcher, broadcasting/film/video in the Last Year: Never true   Barista in the Last Year: Never true  Transportation Needs: Not on file  Physical Activity: Insufficiently Active   Days of Exercise per Week: 7 days   Minutes of Exercise per Session: 20 min   Stress: No Stress Concern Present   Feeling of Stress : Not at all  Social Connections: Moderately Isolated   Frequency of Communication with Friends and Family: More than three times a week   Frequency of Social Gatherings with Friends and Family: Three times a week   Attends Religious Services: More than 4 times per year   Active Member of Clubs or Organizations: No   Attends Banker Meetings: Never   Marital Status: Widowed    Tobacco Counseling Counseling given: Not Answered   Clinical Intake:  Pre-visit preparation completed: Yes  Pain : No/denies pain     BMI - recorded: 28.68 Nutritional Risks: None Diabetes: No  How often do you need to have someone help you when you read instructions, pamphlets, or other written materials from your doctor or pharmacy?: 1 - Never  Diabetic? No  Interpreter Needed?: No  Information entered by :: Sharen Heck LPN   Activities of Daily Living In your present state of health, do you have any difficulty performing the following activities: 12/20/2020  Hearing? N  Vision? N  Difficulty concentrating or making decisions? N  Walking or climbing stairs? N  Dressing or bathing? N  Doing errands, shopping? N  Preparing Food and eating ? N  Using the Toilet? N  In the past six months, have you accidently leaked urine? Y  Comment wears pad  Do you have problems with loss of bowel control? N  Managing your Medications? N  Managing your Finances? N  Housekeeping or managing your Housekeeping? N  Some recent data might be hidden    Patient Care Team: Joaquim Nam, MD as PCP - General (Family Medicine) Kathleene Hazel, MD as PCP - Cardiology (Cardiology) Kathleene Hazel, MD as Consulting Physician (Cardiology) Hillis Range, MD as Consulting Physician (Cardiology) Galen Manila, MD as Referring Physician (Ophthalmology)  Indicate any recent Medical Services you may have received from other  than [facility] providers in the past year (date may be approximate).     Assessment:   This is a routine wellness examination for [place].  Hearing/Vision screen Hearing Screening -  Comments:: No issues Vision Screening - Comments:: Last exam 2022, Dr. Earle Gell, wears glasses  Dietary issues and exercise activities discussed: Current Exercise Habits: Home exercise routine, Type of exercise: walking, Time (Minutes): 15, Frequency (Times/Week): 7, Weekly Exercise (Minutes/Week): 105, Intensity: Mild   Goals Addressed             This Visit's Progress    Patient Stated       Would like to maintain current routine       Depression Screen PHQ 2/9 Scores 12/20/2020 12/19/2019 11/28/2019 11/18/2018 08/20/2017 08/19/2016 05/28/2015  PHQ - 2 Score 0 0 0 0 0 1 0  PHQ- 9 Score - 0 - 0 0 6 -    Fall Risk Fall Risk  12/20/2020 12/19/2019 11/28/2019 11/18/2018 08/20/2017  Falls in the past year? 1 0 0 0 No  Number falls in past yr: 0 0 0 0 -  Injury with Fall? 0 0 0 0 -  Risk for fall due to : Other (Comment) Medication side effect - Medication side effect -  Risk for fall due to: Comment tripped outside - - - -  Follow up Falls prevention discussed Falls evaluation completed;Falls prevention discussed Falls evaluation completed Falls evaluation completed;Falls prevention discussed -    FALL RISK PREVENTION PERTAINING TO THE HOME:  Any stairs in or around the home? Yes  If so, are there any without handrails? No  Home free of loose throw rugs in walkways, pet beds, electrical cords, etc? Yes  Adequate lighting in your home to reduce risk of falls? Yes   ASSISTIVE DEVICES UTILIZED TO PREVENT FALLS:  Life alert? No  Use of a cane, walker or w/c? No  Grab bars in the bathroom? Yes  Shower chair or bench in shower? No  Elevated toilet seat or a handicapped toilet? No   TIMED UP AND GO:  Was the test performed? No , visit completed over the phone.   Cognitive Function: Normal  cognitive status assessed by Nurse Health Advisor. No abnormalities found.   MMSE - Mini Mental State Exam 12/19/2019 11/18/2018 08/20/2017 08/19/2016 05/28/2015  Not completed: Refused - - - -  Orientation to time - 5 5 5 5   Orientation to Place - 5 5 5 5   Registration - 3 3 3 3   Attention/ Calculation - 5 0 0 0  Recall - 3 3 3 3   Language- name 2 objects - - 0 0 0  Language- repeat - 1 1 1 1   Language- follow 3 step command - - 3 3 3   Language- read & follow direction - - 0 0 0  Write a sentence - - 0 0 0  Copy design - - 0 0 0  Total score - - 20 20 20         Immunizations Immunization History  Administered Date(s) Administered   Fluad Quad(high Dose 65+) 10/11/2018, 10/21/2019, 11/08/2020   Influenza Split 11/05/2010   Influenza,inj,Quad PF,6+ Mos 10/13/2012, 10/12/2013, 10/12/2014, 10/10/2015, 10/22/2016, 09/23/2017   Pneumococcal Conjugate-13 05/28/2015   Pneumococcal Polysaccharide-23 09/05/2008   Td 12/05/1997    TDAP status: Up to date  Flu Vaccine status: Up to date  Pneumococcal vaccine status: Up to date  Covid-19 vaccine status: Declined, Education has been provided regarding the importance of this vaccine but patient still declined. Advised may receive this vaccine at local pharmacy or Health Dept.or vaccine clinic. Aware to provide a copy of the vaccination record if obtained from local pharmacy or Health Dept. Verbalized acceptance  and understanding.  Qualifies for Shingles Vaccine? Yes   Zostavax completed No   Shingrix Completed?: No.    Education has been provided regarding the importance of this vaccine. Patient has been advised to call insurance company to determine out of pocket expense if they have not yet received this vaccine. Advised may also receive vaccine at local pharmacy or Health Dept. Verbalized acceptance and understanding.  Screening Tests Health Maintenance  Topic Date Due   Hepatitis C Screening  Never done   Zoster Vaccines- Shingrix  (1 of 2) Never done   TETANUS/TDAP  12/18/2024 (Originally 12/06/2007)   Pneumonia Vaccine 34+ Years old  Completed   INFLUENZA VACCINE  Completed   DEXA SCAN  Completed   HPV VACCINES  Aged Out   COVID-19 Vaccine  Discontinued    Health Maintenance  Health Maintenance Due  Topic Date Due   Hepatitis C Screening  Never done   Zoster Vaccines- Shingrix (1 of 2) Never done    Colorectal cancer screening: No longer required.   Mammogram status: due, patient plans on discussing with PCP.   Bone Density status: due, patient plans on discussing with PCP  Lung Cancer Screening: (Low Dose CT Chest recommended if Age 33-80 years, 30 pack-year currently smoking OR have quit w/in 15years.) does not qualify.     Additional Screening:  Hepatitis C Screening: does not qualify   Vision Screening: Recommended annual ophthalmology exams for early detection of glaucoma and other disorders of the eye. Is the patient up to date with their annual eye exam?  Yes  Who is the provider or what is the name of the office in which the patient attends annual eye exams? Dr. Earle Gell   Dental Screening: Recommended annual dental exams for proper oral hygiene  Community Resource Referral / Chronic Care Management: CRR required this visit?  No   CCM required this visit?  No      Plan:     I have personally reviewed and noted the following in the patients chart:   Medical and social history Use of alcohol, tobacco or illicit drugs  Current medications and supplements including opioid prescriptions.  Functional ability and status Nutritional status Physical activity Advanced directives List of other physicians Hospitalizations, surgeries, and ER visits in previous 12 months Vitals Screenings to include cognitive, depression, and falls Referrals and appointments  In addition, I have reviewed and discussed with patient certain preventive protocols, quality metrics, and best practice  recommendations. A written personalized care plan for preventive services as well as general preventive health recommendations were provided to patient.   Due to this being a telephonic visit, the after visit summary with patients personalized plan was offered to patient via mail or my-chart.  Patient preferred to pick up at office at next visit.    Janne Lab, LPN  16/10/9602   Nurse Health Advisor  Nurse Notes: none

## 2020-12-19 ENCOUNTER — Other Ambulatory Visit: Payer: Medicare HMO

## 2020-12-20 ENCOUNTER — Ambulatory Visit (INDEPENDENT_AMBULATORY_CARE_PROVIDER_SITE_OTHER): Payer: Medicare HMO

## 2020-12-20 VITALS — Ht 59.0 in | Wt 142.0 lb

## 2020-12-20 DIAGNOSIS — Z0000 Encounter for general adult medical examination without abnormal findings: Secondary | ICD-10-CM | POA: Diagnosis not present

## 2020-12-20 NOTE — Patient Instructions (Signed)
Kelsey Dunn , Thank you for taking time to complete your Medicare Wellness Visit. I appreciate your ongoing commitment to your health goals. Please review the following plan we discussed and let me know if I can assist you in the future.   Screening recommendations/referrals: Colonoscopy: no longer required  Mammogram: due, you plan to discuss with PCP  Bone Density: due,last completed 03/09/13,  you plan to discuss with PCP Recommended yearly ophthalmology/optometry visit for glaucoma screening and checkup Recommended yearly dental visit for hygiene and checkup  Vaccinations: Influenza vaccine: up to date Pneumococcal vaccine: up to date Tdap vaccine: due, last completed 12/05/97 Discuss with your local pharmacy Shingles vaccine: Discuss with your local pharmacy  Covid-19: newest booster available at your local pharmacy  Advanced directives: Please bring a copy of Living Will and/or Healthcare Power of Attorney for your chart.   Conditions/risks identified: see problem list  Next appointment: Follow up in one year for your annual wellness visit 12/24/21 @ 11:15am, this will be a telephone visit    Preventive Care 78 Years and Older, Female Preventive care refers to lifestyle choices and visits with your health care provider that can promote health and wellness. What does preventive care include? A yearly physical exam. This is also called an annual well check. Dental exams once or twice a year. Routine eye exams. Ask your health care provider how often you should have your eyes checked. Personal lifestyle choices, including: Daily care of your teeth and gums. Regular physical activity. Eating a healthy diet. Avoiding tobacco and drug use. Limiting alcohol use. Practicing safe sex. Taking low-dose aspirin every day. Taking vitamin and mineral supplements as recommended by your health care provider. What happens during an annual well check? The services and screenings done by your  health care provider during your annual well check will depend on your age, overall health, lifestyle risk factors, and family history of disease. Counseling  Your health care provider may ask you questions about your: Alcohol use. Tobacco use. Drug use. Emotional well-being. Home and relationship well-being. Sexual activity. Eating habits. History of falls. Memory and ability to understand (cognition). Work and work Astronomer. Reproductive health. Screening  You may have the following tests or measurements: Height, weight, and BMI. Blood pressure. Lipid and cholesterol levels. These may be checked every 5 years, or more frequently if you are over 52 years old. Skin check. Lung cancer screening. You may have this screening every year starting at age 37 if you have a 30-pack-year history of smoking and currently smoke or have quit within the past 15 years. Fecal occult blood test (FOBT) of the stool. You may have this test every year starting at age 64. Flexible sigmoidoscopy or colonoscopy. You may have a sigmoidoscopy every 5 years or a colonoscopy every 10 years starting at age 48. Hepatitis C blood test. Hepatitis B blood test. Sexually transmitted disease (STD) testing. Diabetes screening. This is done by checking your blood sugar (glucose) after you have not eaten for a while (fasting). You may have this done every 1-3 years. Bone density scan. This is done to screen for osteoporosis. You may have this done starting at age 40. Mammogram. This may be done every 1-2 years. Talk to your health care provider about how often you should have regular mammograms. Talk with your health care provider about your test results, treatment options, and if necessary, the need for more tests. Vaccines  Your health care provider may recommend certain vaccines, such as: Influenza vaccine.  This is recommended every year. Tetanus, diphtheria, and acellular pertussis (Tdap, Td) vaccine. You may  need a Td booster every 10 years. Zoster vaccine. You may need this after age 7. Pneumococcal 13-valent conjugate (PCV13) vaccine. One dose is recommended after age 54. Pneumococcal polysaccharide (PPSV23) vaccine. One dose is recommended after age 39. Talk to your health care provider about which screenings and vaccines you need and how often you need them. This information is not intended to replace advice given to you by your health care provider. Make sure you discuss any questions you have with your health care provider. Document Released: 01/18/2015 Document Revised: 09/11/2015 Document Reviewed: 10/23/2014 Elsevier Interactive Patient Education  2017 [employer] Prevention in the Home Falls can cause injuries. They can happen to people of all ages. There are many things you can do to make your home safe and to help prevent falls. What can I do on the outside of my home? Regularly fix the edges of walkways and driveways and fix any cracks. Remove anything that might make you trip as you walk through a door, such as a raised step or threshold. Trim any bushes or trees on the path to your home. Use bright outdoor lighting. Clear any walking paths of anything that might make someone trip, such as rocks or tools. Regularly check to see if handrails are loose or broken. Make sure that both sides of any steps have handrails. Any raised decks and porches should have guardrails on the edges. Have any leaves, snow, or ice cleared regularly. Use sand or salt on walking paths during winter. Clean up any spills in your garage right away. This includes oil or grease spills. What can I do in the bathroom? Use night lights. Install grab bars by the toilet and in the tub and shower. Do not use towel bars as grab bars. Use non-skid mats or decals in the tub or shower. If you need to sit down in the shower, use a plastic, non-slip stool. Keep the floor dry. Clean up any water that spills on  the floor as soon as it happens. Remove soap buildup in the tub or shower regularly. Attach bath mats securely with double-sided non-slip rug tape. Do not have throw rugs and other things on the floor that can make you trip. What can I do in the bedroom? Use night lights. Make sure that you have a light by your bed that is easy to reach. Do not use any sheets or blankets that are too big for your bed. They should not hang down onto the floor. Have a firm chair that has side arms. You can use this for support while you get dressed. Do not have throw rugs and other things on the floor that can make you trip. What can I do in the kitchen? Clean up any spills right away. Avoid walking on wet floors. Keep items that you use a lot in easy-to-reach places. If you need to reach something above you, use a strong step stool that has a grab bar. Keep electrical cords out of the way. Do not use floor polish or wax that makes floors slippery. If you must use wax, use non-skid floor wax. Do not have throw rugs and other things on the floor that can make you trip. What can I do with my stairs? Do not leave any items on the stairs. Make sure that there are handrails on both sides of the stairs and use them. Fix handrails  that are broken or loose. Make sure that handrails are as long as the stairways. Check any carpeting to make sure that it is firmly attached to the stairs. Fix any carpet that is loose or worn. Avoid having throw rugs at the top or bottom of the stairs. If you do have throw rugs, attach them to the floor with carpet tape. Make sure that you have a light switch at the top of the stairs and the bottom of the stairs. If you do not have them, ask someone to add them for you. What else can I do to help prevent falls? Wear shoes that: Do not have high heels. Have rubber bottoms. Are comfortable and fit you well. Are closed at the toe. Do not wear sandals. If you use a stepladder: Make sure  that it is fully opened. Do not climb a closed stepladder. Make sure that both sides of the stepladder are locked into place. Ask someone to hold it for you, if possible. Clearly mark and make sure that you can see: Any grab bars or handrails. First and last steps. Where the edge of each step is. Use tools that help you move around (mobility aids) if they are needed. These include: Canes. Walkers. Scooters. Crutches. Turn on the lights when you go into a dark area. Replace any light bulbs as soon as they burn out. Set up your furniture so you have a clear path. Avoid moving your furniture around. If any of your floors are uneven, fix them. If there are any pets around you, be aware of where they are. Review your medicines with your doctor. Some medicines can make you feel dizzy. This can increase your chance of falling. Ask your doctor what other things that you can do to help prevent falls. This information is not intended to replace advice given to you by your health care provider. Make sure you discuss any questions you have with your health care provider. Document Released: 10/18/2008 Document Revised: 05/30/2015 Document Reviewed: 01/26/2014 Elsevier Interactive Patient Education  2017 [employer].

## 2020-12-24 ENCOUNTER — Encounter: Payer: Medicare HMO | Admitting: Family Medicine

## 2021-01-08 ENCOUNTER — Other Ambulatory Visit: Payer: Self-pay | Admitting: Family Medicine

## 2021-01-08 DIAGNOSIS — E559 Vitamin D deficiency, unspecified: Secondary | ICD-10-CM

## 2021-01-08 DIAGNOSIS — I10 Essential (primary) hypertension: Secondary | ICD-10-CM

## 2021-01-08 DIAGNOSIS — R7309 Other abnormal glucose: Secondary | ICD-10-CM

## 2021-01-08 DIAGNOSIS — E039 Hypothyroidism, unspecified: Secondary | ICD-10-CM

## 2021-01-13 ENCOUNTER — Other Ambulatory Visit: Payer: Medicare HMO

## 2021-01-15 ENCOUNTER — Ambulatory Visit (INDEPENDENT_AMBULATORY_CARE_PROVIDER_SITE_OTHER): Payer: Medicare HMO

## 2021-01-15 DIAGNOSIS — I441 Atrioventricular block, second degree: Secondary | ICD-10-CM | POA: Diagnosis not present

## 2021-01-15 LAB — CUP PACEART REMOTE DEVICE CHECK
Battery Impedance: 1668 Ohm
Battery Remaining Longevity: 40 mo
Battery Voltage: 2.76 V
Brady Statistic AP VP Percent: 40 %
Brady Statistic AP VS Percent: 0 %
Brady Statistic AS VP Percent: 60 %
Brady Statistic AS VS Percent: 0 %
Date Time Interrogation Session: 20230111082250
Implantable Lead Implant Date: 20131106
Implantable Lead Implant Date: 20131106
Implantable Lead Location: 753859
Implantable Lead Location: 753860
Implantable Lead Model: 5076
Implantable Lead Model: 5092
Implantable Pulse Generator Implant Date: 20131106
Lead Channel Impedance Value: 449 Ohm
Lead Channel Impedance Value: 613 Ohm
Lead Channel Pacing Threshold Amplitude: 0.375 V
Lead Channel Pacing Threshold Amplitude: 1.125 V
Lead Channel Pacing Threshold Pulse Width: 0.4 ms
Lead Channel Pacing Threshold Pulse Width: 0.4 ms
Lead Channel Setting Pacing Amplitude: 2 V
Lead Channel Setting Pacing Amplitude: 2.5 V
Lead Channel Setting Pacing Pulse Width: 0.4 ms
Lead Channel Setting Sensing Sensitivity: 2 mV

## 2021-01-20 ENCOUNTER — Encounter: Payer: Medicare HMO | Admitting: Family Medicine

## 2021-01-24 NOTE — Progress Notes (Signed)
Remote pacemaker transmission.   

## 2021-02-21 ENCOUNTER — Other Ambulatory Visit (INDEPENDENT_AMBULATORY_CARE_PROVIDER_SITE_OTHER): Payer: Medicare HMO

## 2021-02-21 ENCOUNTER — Other Ambulatory Visit: Payer: Self-pay

## 2021-02-21 DIAGNOSIS — I10 Essential (primary) hypertension: Secondary | ICD-10-CM

## 2021-02-21 DIAGNOSIS — E559 Vitamin D deficiency, unspecified: Secondary | ICD-10-CM

## 2021-02-21 DIAGNOSIS — E039 Hypothyroidism, unspecified: Secondary | ICD-10-CM

## 2021-02-21 DIAGNOSIS — R7309 Other abnormal glucose: Secondary | ICD-10-CM

## 2021-02-21 LAB — COMPREHENSIVE METABOLIC PANEL
ALT: 9 U/L (ref 0–35)
AST: 14 U/L (ref 0–37)
Albumin: 4.5 g/dL (ref 3.5–5.2)
Alkaline Phosphatase: 50 U/L (ref 39–117)
BUN: 26 mg/dL — ABNORMAL HIGH (ref 6–23)
CO2: 34 mEq/L — ABNORMAL HIGH (ref 19–32)
Calcium: 10.1 mg/dL (ref 8.4–10.5)
Chloride: 102 mEq/L (ref 96–112)
Creatinine, Ser: 0.9 mg/dL (ref 0.40–1.20)
GFR: 61.32 mL/min (ref >60.00)
Glucose, Bld: 95 mg/dL (ref 70–99)
Potassium: 4 mEq/L (ref 3.5–5.1)
Sodium: 141 mEq/L (ref 135–145)
Total Bilirubin: 0.7 mg/dL (ref 0.2–1.2)
Total Protein: 6.7 g/dL (ref 6.0–8.3)

## 2021-02-21 LAB — TSH: TSH: 0.66 u[IU]/mL (ref 0.35–5.50)

## 2021-02-21 LAB — LIPID PANEL
Cholesterol: 198 mg/dL (ref 0–200)
HDL: 51.4 mg/dL (ref >39.00)
LDL Cholesterol: 120 mg/dL — ABNORMAL HIGH (ref 0–99)
NonHDL: 147.02
Total CHOL/HDL Ratio: 4
Triglycerides: 137 mg/dL (ref 0.0–149.0)
VLDL: 27.4 mg/dL (ref 0.0–40.0)

## 2021-02-21 LAB — VITAMIN D 25 HYDROXY (VIT D DEFICIENCY, FRACTURES): VITD: 45.45 ng/mL (ref 30.00–100.00)

## 2021-02-21 LAB — HEMOGLOBIN A1C: Hgb A1c MFr Bld: 5.3 % (ref 4.6–6.5)

## 2021-02-27 ENCOUNTER — Ambulatory Visit (INDEPENDENT_AMBULATORY_CARE_PROVIDER_SITE_OTHER): Payer: Medicare HMO | Admitting: Family Medicine

## 2021-02-27 ENCOUNTER — Encounter: Payer: Self-pay | Admitting: Family Medicine

## 2021-02-27 ENCOUNTER — Other Ambulatory Visit: Payer: Self-pay

## 2021-02-27 ENCOUNTER — Other Ambulatory Visit: Payer: Self-pay | Admitting: Family Medicine

## 2021-02-27 VITALS — BP 124/60 | HR 73 | Temp 97.7°F | Ht 59.0 in | Wt 147.0 lb

## 2021-02-27 DIAGNOSIS — Z66 Do not resuscitate: Secondary | ICD-10-CM

## 2021-02-27 DIAGNOSIS — I10 Essential (primary) hypertension: Secondary | ICD-10-CM

## 2021-02-27 DIAGNOSIS — E559 Vitamin D deficiency, unspecified: Secondary | ICD-10-CM | POA: Diagnosis not present

## 2021-02-27 DIAGNOSIS — Z7189 Other specified counseling: Secondary | ICD-10-CM

## 2021-02-27 DIAGNOSIS — E785 Hyperlipidemia, unspecified: Secondary | ICD-10-CM

## 2021-02-27 DIAGNOSIS — Z0000 Encounter for general adult medical examination without abnormal findings: Secondary | ICD-10-CM

## 2021-02-27 DIAGNOSIS — E039 Hypothyroidism, unspecified: Secondary | ICD-10-CM

## 2021-02-27 DIAGNOSIS — Z8673 Personal history of transient ischemic attack (TIA), and cerebral infarction without residual deficits: Secondary | ICD-10-CM

## 2021-02-27 DIAGNOSIS — M79643 Pain in unspecified hand: Secondary | ICD-10-CM | POA: Diagnosis not present

## 2021-02-27 MED ORDER — CLOPIDOGREL BISULFATE 75 MG PO TABS
75.0000 mg | ORAL_TABLET | Freq: Every day | ORAL | 3 refills | Status: DC
Start: 2021-02-27 — End: 2022-03-02

## 2021-02-27 MED ORDER — AMLODIPINE BESYLATE 5 MG PO TABS: 5.0000 mg | ORAL_TABLET | Freq: Two times a day (BID) | ORAL | 3 refills | Status: DC

## 2021-02-27 MED ORDER — LEVOTHYROXINE SODIUM 100 MCG PO TABS: 100.0000 ug | ORAL_TABLET | Freq: Every day | ORAL | 3 refills | Status: DC

## 2021-02-27 MED ORDER — HYDROCHLOROTHIAZIDE 25 MG PO TABS: 25.0000 mg | ORAL_TABLET | Freq: Every day | ORAL | 3 refills | Status: DC

## 2021-02-27 MED ORDER — OLMESARTAN MEDOXOMIL 40 MG PO TABS
40.0000 mg | ORAL_TABLET | Freq: Every day | ORAL | 3 refills | Status: DC
Start: 2021-02-27 — End: 2022-03-02

## 2021-02-27 NOTE — Progress Notes (Signed)
This visit occurred during the SARS-CoV-2 public health emergency.  Safety protocols were in place, including screening questions prior to the visit, additional usage of staff PPE, and extensive cleaning of exam room while observing appropriate contact time as indicated for disinfecting solutions.  Hypertension:    Using medication without problems or lightheadedness: yes Chest pain with exertion:no Edema:no Short of breath:no She is trying to cook for one, since her husband passed.  She is trying to adjust to the changes in her life, d/w pt.   Vit D def hx noted.  She has normal vit D now with taking 1000 units daily.  Labs d/w pt.    H/o CVA.  Statin intolerant.  Labs d/w pt.  She is off aspirin because of bruising (bruising is now better) and still on plavix.  Her bruising improved in the meantime.    Hypothyroidism.   TSH wnl.  Compliant.  Labs d/w pt.  No neck mass.    Balance changes.  Noted with trouble leaning forward but not orthostatic symptoms.  D/w pt about options.    Knee pain and hand pain.  This week sent went out shopping, was able to do that but with discomfort.  R handed, more pain writing.  She has B thumb aching.    Flu shot 2022   covid vaccine d/w PNA up to date Tetanus 1999. Shingles d/w pt.   Colonoscopy declined in 2023 Mammogram- dw pt, encouraged.  She declined at this point.  She does manual checks.   DXA d/w pt.  She'll consider.  Defer for now per patient request.   Advance directive- Son Itzell Bendavid, brother Jeananne Rama, and granddaughter Jacquese Cassarino all equally designated if patient were incapacitated.  She explicitly wanted to be a DNR.    Meds, vitals, and allergies reviewed.   PMH and SH reviewed  ROS: Per HPI unless specifically indicated in ROS section   GEN: nad, alert and oriented HEENT: ncat NECK: supple w/o LA CV: rrr.  SEM noted at baseline.   PULM: ctab, no inc wob ABD: soft, +bs EXT: no edema SKIN: no acute  rash She has B hand contractures, palmar.  She has degenerative changes at the PIPs and DIPs.   R knee is puffy without bruising.  R knee with crepitus on ROM.

## 2021-02-27 NOTE — Patient Instructions (Addendum)
Ask about seeing Dr. Lorelei Pont for your hands.  Try a thumb spica brace and see if that helps.   Let me know if you want to go to PT for balance and knee pain.   Take care.  Glad to see you.

## 2021-03-02 NOTE — Progress Notes (Signed)
[name], MD, [facility] [place], [address]  Phone: [phone]   FAX: [place] - 81 y.o. female   MRN [id number]   Date of Birth: 05/12/1940  Date: 03/03/2021   PCP: Tonia Ghent, MD   Referral: Tonia Ghent, MD  Chief Complaint  Patient presents with   Hand Pain    Bilateral    Foot Pain    Pain in Toes-Loses Balance    This visit occurred during the SARS-CoV-2 public health emergency.  Safety protocols were in place, including screening questions prior to the visit, additional usage of staff PPE, and extensive cleaning of exam room while observing appropriate contact time as indicated for disinfecting solutions.   Subjective:   Kelsey Dunn is a 81 y.o. very pleasant female patient with Body mass index is 29.77 kg/m. who presents with the following:  The patient is seen in consultation for my partner Dr. Damita Dunnings.  The patient has been having pain and pain and pain in the thumb, and she is here for additional recommendations.  He recently did give her last week a thumb spica splint to try.  She really comes in and has complaints with polyarthralgia and some discomfort in all joints of the hand, wrist, she also complains of some pain in the toes bilaterally.  She also has some right knee pain.  Specifically around the hand joints she has had swelling and changes at the joints themselves.  She is having some difficulty with fine motor grasping particularly she has pain in the basal joints bilaterally.  This point she has not done much of anything except for some occasional Tylenol.  She did look at some Voltaren gel, but she got concerned about potential liver implication.  She has never had an injection, worn a splint, or had any form of operative intervention.  No major prior traumas or fractures.    Review of Systems is noted in the HPI, as  appropriate  Objective:   BP 130/62    Pulse 73    Temp 98.1 F (36.7 C) (Temporal)    Ht 4\' 11"  (1.499 m)    Wt 147 lb 6 oz (66.8 kg)    SpO2 99%    BMI 29.77 kg/m   GEN: No acute distress; alert,appropriate. PULM: Breathing comfortably in no respiratory distress PSYCH: Normally interactive.   Bilateral hands and wrists with fairly dramatic basal joint enlargement and pain as well as crepitus with motion.  She has Heberden's nodes diffusely bilateral hands and fingers.  She has both thenar and hypothenar muscle weakness.  Notable with wasting.  Her grip is still only slightly diminished bilaterally.  Does have good extension and flexion at the wrist as well as spreading of her fingers.  Normal full range of motion at the elbow.  Toe joints are also diffusely tender.  Laboratory and Imaging Data:  Assessment and Plan:     ICD-10-CM   1. Arthritis of both hands  M19.041    M19.042     2. Bilateral hand pain  M79.641    M79.642      Total encounter time: 30 minutes. This includes total time spent on the day of encounter.    This is going to be a challenging case in general.  She has fairly advanced degenerative changes throughout the entirety of her hand and wrist including all PIP, DIP joints.  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  Did recommend that she use some Tylenol intermittently.  Also reviewed how to use Voltaren gel, which would be safe in this case and only 5% gets in the bloodstream.  I gave her some small thumb spica splints to limit the motion at the thumb and basal joint only to use as directed.  I would hold off on doing any kind of further intervention at this point.  It would be essentially impossible to inject so many different joints.  If she still having quite a bit of difficulty we could do a diagnostic injection at the [facility] joint see if this provides any kind of significant relief, but would be limited only at this  area.  She becomes more debilitated basal joint arthroplasty could be considered by hand surgery.  She has had multiple medical problems, so I would hold off on doing this unless really needed.  Also gave her some basic hand rehab to work on.  Patient Instructions  Take Tylenol/Acetaminophen ES (500mg ) 2 tabs by mouth three times a day max as needed.  Voltaren 1% gel, over the counter You can apply up to 4 times a day  This can be applied to any joint: knee, wrist, fingers, elbows, shoulders, feet and ankles. Can apply to any tendon: tennis elbow, achilles, tendon, rotator cuff or any other tendon.  Minimal is absorbed in the bloodstream: ok with oral anti-inflammatory or a blood thinner.  Cost is about 9 dollars    You can wear your thumb splints if your thumbs start hurting, but don't wear them all the time.  Wear them for a few days to give them a rest for a few days.   No orders of the defined types were placed in this encounter.  There are no discontinued medications. No orders of the defined types were placed in this encounter.   Follow-up: No follow-ups on file.  [employer] One speech-to-text software was used for transcription in this dictation.  Possible transcriptional errors can occur using Editor, commissioning.   Signed,  Maud Deed. Rubena Roseman, MD   Outpatient Encounter Medications as of 03/03/2021  Medication Sig   acetaminophen (TYLENOL) 500 MG tablet Take 1,000 mg by mouth every 6 (six) hours as needed for moderate pain.    amLODipine (NORVASC) 5 MG tablet Take 1 tablet (5 mg total) by mouth 2 (two) times daily.   clopidogrel (PLAVIX) 75 MG tablet Take 1 tablet (75 mg total) by mouth daily.   hydrochlorothiazide (HYDRODIURIL) 25 MG tablet Take 1 tablet (25 mg total) by mouth daily.   levothyroxine (SYNTHROID) 100 MCG tablet Take 1 tablet (100 mcg total) by mouth daily.   nitroGLYCERIN (NITROSTAT) 0.4 MG SL tablet Place 1 tablet (0.4 mg total) under the tongue  every 5 (five) minutes as needed for chest pain (MAX 3 TABLETS).   olmesartan (BENICAR) 40 MG tablet Take 1 tablet (40 mg total) by mouth daily.   Vitamin D, Cholecalciferol, 25 MCG (1000 UT) TABS Take 1,000 Units by mouth daily.   No facility-administered encounter medications on file as of 03/03/2021.

## 2021-03-03 ENCOUNTER — Other Ambulatory Visit: Payer: Self-pay

## 2021-03-03 ENCOUNTER — Encounter: Payer: Self-pay | Admitting: Family Medicine

## 2021-03-03 ENCOUNTER — Ambulatory Visit (INDEPENDENT_AMBULATORY_CARE_PROVIDER_SITE_OTHER): Payer: Medicare HMO | Admitting: Family Medicine

## 2021-03-03 VITALS — BP 130/62 | HR 73 | Temp 98.1°F | Ht 59.0 in | Wt 147.4 lb

## 2021-03-03 DIAGNOSIS — M19041 Primary osteoarthritis, right hand: Secondary | ICD-10-CM

## 2021-03-03 DIAGNOSIS — M19042 Primary osteoarthritis, left hand: Secondary | ICD-10-CM | POA: Diagnosis not present

## 2021-03-03 DIAGNOSIS — M79642 Pain in left hand: Secondary | ICD-10-CM | POA: Diagnosis not present

## 2021-03-03 DIAGNOSIS — M79641 Pain in right hand: Secondary | ICD-10-CM

## 2021-03-03 NOTE — Patient Instructions (Signed)
Take Tylenol/Acetaminophen ES (500mg ) 2 tabs by mouth three times a day max as needed.  Voltaren 1% gel, over the counter You can apply up to 4 times a day  This can be applied to any joint: knee, wrist, fingers, elbows, shoulders, feet and ankles. Can apply to any tendon: tennis elbow, achilles, tendon, rotator cuff or any other tendon.  Minimal is absorbed in the bloodstream: ok with oral anti-inflammatory or a blood thinner.  Cost is about 9 dollars    You can wear your thumb splints if your thumbs start hurting, but don't wear them all the time.  Wear them for a few days to give them a rest for a few days.

## 2021-03-05 DIAGNOSIS — Z8673 Personal history of transient ischemic attack (TIA), and cerebral infarction without residual deficits: Secondary | ICD-10-CM | POA: Insufficient documentation

## 2021-03-05 NOTE — Assessment & Plan Note (Signed)
?  Flu shot 2022   ?covid vaccine d/w ?PNA up to date ?Tetanus 1999. ?Shingles d/w pt.   ?Colonoscopy declined in 2023 ?Mammogram- dw pt, encouraged.  She declined at this point.  She does manual checks.   ?DXA d/w pt.  She'll consider.  Defer for now per patient request.   ?Advance directive- Son Yaritzi Hammer, brother Leo Grosser, and granddaughter Izna Kneer all equally designated if patient were incapacitated.  ?She explicitly wanted to be a DNR.   ?

## 2021-03-05 NOTE — Assessment & Plan Note (Signed)
Statin intolerant.  Continue work on diet and exercise. 

## 2021-03-05 NOTE — Assessment & Plan Note (Signed)
I asked her to try thumb spica splint in the meantime to see if that would help.  I would also like her to see Dr. Patsy Lager.  See after visit summary. ? ?I also talked with her about going to PT for balance and for knee pain.  She can consider. ?

## 2021-03-05 NOTE — Assessment & Plan Note (Signed)
Advance directive- Son Kenneth Parsley, brother Jerry Kenneth Merritt, and granddaughter Kolbe Ray Maiello all equally designated if patient were incapacitated.  She explicitly wanted to be a DNR.   

## 2021-03-05 NOTE — Assessment & Plan Note (Signed)
Statin intolerant.  Continue Plavix.  She is off aspirin due to history of bruising.  Continue listed blood pressure medications. ?

## 2021-03-05 NOTE — Assessment & Plan Note (Signed)
History of.  Continue replacement. ?

## 2021-03-05 NOTE — Assessment & Plan Note (Signed)
TSH normal ?- Continue levothyroxine ?

## 2021-03-05 NOTE — Assessment & Plan Note (Signed)
Reaffirmed by patient. ?

## 2021-03-05 NOTE — Assessment & Plan Note (Signed)
Continue amlodipine and hydrochlorothiazide, olmesartan. ?

## 2021-04-16 ENCOUNTER — Ambulatory Visit (INDEPENDENT_AMBULATORY_CARE_PROVIDER_SITE_OTHER): Payer: Medicare HMO

## 2021-04-16 DIAGNOSIS — I441 Atrioventricular block, second degree: Secondary | ICD-10-CM

## 2021-04-17 LAB — CUP PACEART REMOTE DEVICE CHECK
Battery Impedance: 1843 Ohm
Battery Remaining Longevity: 36 mo
Battery Voltage: 2.75 V
Brady Statistic AP VP Percent: 40 %
Brady Statistic AP VS Percent: 0 %
Brady Statistic AS VP Percent: 60 %
Brady Statistic AS VS Percent: 0 %
Date Time Interrogation Session: 20230412080545
Implantable Lead Implant Date: 20131106
Implantable Lead Implant Date: 20131106
Implantable Lead Location: 753859
Implantable Lead Location: 753860
Implantable Lead Model: 5076
Implantable Lead Model: 5092
Implantable Pulse Generator Implant Date: 20131106
Lead Channel Impedance Value: 450 Ohm
Lead Channel Impedance Value: 565 Ohm
Lead Channel Pacing Threshold Amplitude: 0.375 V
Lead Channel Pacing Threshold Amplitude: 1.125 V
Lead Channel Pacing Threshold Pulse Width: 0.4 ms
Lead Channel Pacing Threshold Pulse Width: 0.4 ms
Lead Channel Setting Pacing Amplitude: 2 V
Lead Channel Setting Pacing Amplitude: 2.5 V
Lead Channel Setting Pacing Pulse Width: 0.4 ms
Lead Channel Setting Sensing Sensitivity: 2 mV

## 2021-04-25 ENCOUNTER — Telehealth: Payer: Self-pay

## 2021-04-25 ENCOUNTER — Ambulatory Visit (INDEPENDENT_AMBULATORY_CARE_PROVIDER_SITE_OTHER)
Admission: RE | Admit: 2021-04-25 | Discharge: 2021-04-25 | Disposition: A | Discharge status: Home / Self Care | Payer: Medicare HMO | Source: Ambulatory Visit | Attending: Family Medicine | Admitting: Family Medicine

## 2021-04-25 ENCOUNTER — Encounter: Payer: Self-pay | Admitting: Family Medicine

## 2021-04-25 ENCOUNTER — Ambulatory Visit (INDEPENDENT_AMBULATORY_CARE_PROVIDER_SITE_OTHER): Payer: Medicare HMO | Admitting: Family Medicine

## 2021-04-25 VITALS — BP 122/62 | HR 67 | Temp 98.1°F | Ht 61.0 in | Wt 147.5 lb

## 2021-04-25 DIAGNOSIS — M7989 Other specified soft tissue disorders: Secondary | ICD-10-CM | POA: Diagnosis not present

## 2021-04-25 DIAGNOSIS — M25561 Pain in right knee: Secondary | ICD-10-CM

## 2021-04-25 NOTE — Patient Instructions (Addendum)
I  will call a with X-ray results. ? Start Voltaren gel 2-4 times daily. ? Can use tylenol for pain. ?  ?

## 2021-04-25 NOTE — Progress Notes (Signed)
Right leg and knee swollen just out of the blue ?

## 2021-04-25 NOTE — Assessment & Plan Note (Signed)
Acute flare of chronic knee pain. ?Poor control ? ?Most likely secondary to osteoarthritis flare, less likely new injury such as meniscal tear or medial collateral ligament injury. ? ?She has been seen twice for this issue so we will do a plain film to determine the extent of osteoarthritis.  She has been hesitant to use Voltaren gel because she is worried about side effects.  We discussed that this is probably the safest medication to use as it is topical with limited absorption.  She will start Voltaren gel 2-4 times daily over-the-counter.  She can use Tylenol as needed for pain. ? ?Of note, this leg pain is not consistent at all with DVT.  No red flags. ?

## 2021-04-25 NOTE — Telephone Encounter (Signed)
Per appt notes pt already has appt with Dr Ermalene Searing today at 9:20. Sending note to Dr Ermalene Searing and Lupita Leash CMA. ?

## 2021-04-25 NOTE — Telephone Encounter (Signed)
[facility] Day - Client ?TELEPHONE ADVICE RECORD ?AccessNurse? ?Patient ?Name: ?[name] ?ODY ?Gender: Female ?DOB: Dec 17, 1940 ?Age: 81 Y 5 M 22 D ?Return ?Phone ?Number: ?[id number] ?(Primary) ?Address: ?City/ ?State/ ?Zip: [place]  ? [id number] ?Client [facility] - Client ?Client Site [facility] - Day ?Provider Raechel Ache - MD ?Contact Type Call ?Who Is Calling Patient / Member / Family / Caregiver ?Call Type Triage / Clinical ?Relationship To Patient Self ?Return Phone Number [phone] (Primary) ?Chief Complaint Leg Swelling And Edema ?Reason for Call Symptomatic / Request for Health Information ?Initial Comment Caller states has a swollen leg. SHe having a hard ?time walking. ?Translation No ?Nurse Assessment ?Nurse: Noelle Penner, RN, [place] Derry Date/Time Lamount Cohen Time): 04/24/2021 4:35:53 PM ?Confirm and document reason for call. If ?symptomatic, describe symptoms. ?---Caller states she has some swelling in leg. She had ?a previous injury in October and now is having new ?onset swelling. Denies fever ?Does the patient have any new or worsening ?symptoms? ---Yes ?Will a triage be completed? ---Yes ?Related visit to physician within the last 2 weeks? ---No ?Does the PT have any chronic conditions? (i.e. ?diabetes, asthma, this includes High risk factors for ?pregnancy, etc.) ?---Yes ?List chronic conditions. ---Blood thinners, ?Is this a behavioral health or substance abuse call? ---No ?Guidelines ?Guideline Title Affirmed Question Affirmed Notes Nurse Date/Time (Eastern ?Time) ?Leg Swelling and ?Edema ?[1] Thigh, calf, or ?ankle swelling AND ?[2] bilateral AND ?[3] 1 side is more ?swollen ?Noelle Penner, RN, Ethan 04/24/2021 4:36:22 ?PM ?Disp. Time (Eastern ?Time) Disposition Final User ?04/24/2021 4:21:30 PM Attempt made - no message left Noelle Penner, RN, En[place] Derry ?PLEASE NOTE: All timestamps contained within this report are represented as [place] Standard  Time. ?CONFIDENTIALTY NOTICE: This fax transmission is intended only for the addressee. It contains information that is legally privileged, confidential or ?otherwise protected from use or disclosure. If you are not the intended recipient, you are strictly prohibited from reviewing, disclosing, copying using ?or disseminating any of this information or taking any action in reliance on or regarding this information. If you have received this fax in error, please ?notify us immediately by telephone so that we can arrange for its return to [place]. Phone: [phone], Toll-Free: [phone], Fax: [phone] ?Page: 2 of 2 ?Call Id: [id number] ?04/24/2021 4:42:25 PM See HCP within 4 Hours (or ?PCP triage) ?Radonna Ricker, RN, [place] Derry ?Caller Disagree/Comply Comply ?Caller Understands Yes ?PreDisposition InappropriateToAsk ?Care Advice Given Per Guideline ?SEE HCP (OR PCP TRIAGE) WITHIN 4 HOURS: CALL EMS IF: * Chest pain or shortness of breath occurs. CARE ADVICE ?given per Leg Swelling and Edema (Adult) guideline. ?Comments ?User: Marco Collie, RN Date/Time Lamount Cohen Time): 04/24/2021 4:42:45 PM ?Wants to be seen tomorrow, refused UC or ED tonight, ?Referrals ?GO TO FACILITY REFUSE ?

## 2021-04-25 NOTE — Progress Notes (Signed)
? ? Patient ID: Kelsey Dunn, female    DOB: [birth year], 81 y.o.   MRN: [id number] ? ?This visit was conducted in person. ? ?BP 122/62 (BP Location: Right Arm, Patient Position: Sitting, Cuff Size: Normal)   Pulse 67   Temp 98.1 ?F (36.7 ?C) (Oral)   Ht 5\' 1"  (1.549 m)   Wt 147 lb 8 oz (66.9 kg)   SpO2 99%   BMI 27.87 kg/m?   ? ?CC:  ?Chief Complaint  ?Patient presents with  ? Leg Swelling  ? ? ?Subjective:  ? ?HPI: ?Kelsey Dunn is a 81 y.o. female with history of HTN, hypothyroid presenting on 04/25/2021 for Leg Swelling ? ?Chief Complaint  ?Patient presents with  ? Leg Swelling  ? ? She has noted  right knee swelling ongoing for  several weeks, more so than chronic swelling. ? She fell last other accidentally over a bucket.. hit right knee.. since then it has been swelling off and on. ?She was referred to Dr. 04/27/2021.. Dx with OA in knees, feet and hands. ? ? She has  no ankle pain, no calf pain, no redness, no fever. No S/S  of DVT. ? Right knee painful at times, feels weaker than it did. ?   ?She has treated with heat, elevation in recliner. Using tylenol for pain.. helps some. ? ? ? ? ?Relevant past medical, surgical, family and social history reviewed and updated as indicated. Interim medical history since our last visit reviewed. ?Allergies and medications reviewed and updated. ?Outpatient Medications Prior to Visit  ?Medication Sig Dispense Refill  ? acetaminophen (TYLENOL) 500 MG tablet Take 1,000 mg by mouth every 6 (six) hours as needed for moderate pain.     ? amLODipine (NORVASC) 5 MG tablet Take 1 tablet (5 mg total) by mouth 2 (two) times daily. 180 tablet 3  ? clopidogrel (PLAVIX) 75 MG tablet Take 1 tablet (75 mg total) by mouth daily. 90 tablet 3  ? hydrochlorothiazide (HYDRODIURIL) 25 MG tablet Take 1 tablet (25 mg total) by mouth daily. 90 tablet 3  ? levothyroxine (SYNTHROID) 100 MCG tablet Take 1 tablet (100 mcg total) by mouth daily. 90 tablet 3  ? nitroGLYCERIN (NITROSTAT) 0.4 MG SL  tablet Place 1 tablet (0.4 mg total) under the tongue every 5 (five) minutes as needed for chest pain (MAX 3 TABLETS). 25 tablet 1  ? olmesartan (BENICAR) 40 MG tablet Take 1 tablet (40 mg total) by mouth daily. 90 tablet 3  ? Vitamin D, Cholecalciferol, 25 MCG (1000 UT) TABS Take 1,000 Units by mouth daily.    ? ?No facility-administered medications prior to visit.  ?  ? ?Per HPI unless specifically indicated in ROS section below ?Review of Systems  ?Constitutional:  Negative for fatigue and fever.  ?HENT:  Negative for ear pain.   ?Eyes:  Negative for pain.  ?Respiratory:  Negative for chest tightness and shortness of breath.   ?Cardiovascular:  Negative for chest pain, palpitations and leg swelling.  ?Gastrointestinal:  Negative for abdominal pain.  ?Genitourinary:  Negative for dysuria.  ?Objective:  ?BP 122/62 (BP Location: Right Arm, Patient Position: Sitting, Cuff Size: Normal)   Pulse 67   Temp 98.1 ?F (36.7 ?C) (Oral)   Ht 5\' 1"  (1.549 m)   Wt 147 lb 8 oz (66.9 kg)   SpO2 99%   BMI 27.87 kg/m?   ?Wt Readings from Last 3 Encounters:  ?04/25/21 147 lb 8 oz (66.9 kg)  ?03/03/21 147 lb 6  oz (66.8 kg)  ?02/27/21 147 lb (66.7 kg)  ?  ?  ?Physical Exam ?Constitutional:   ?   General: She is not in acute distress. ?   Appearance: Normal appearance. She is well-developed. She is not ill-appearing or toxic-appearing.  ?HENT:  ?   Head: Normocephalic.  ?   Right Ear: Hearing, tympanic membrane, ear canal and external ear normal. Tympanic membrane is not erythematous, retracted or bulging.  ?   Left Ear: Hearing, tympanic membrane, ear canal and external ear normal. Tympanic membrane is not erythematous, retracted or bulging.  ?   Nose: No mucosal edema or rhinorrhea.  ?   Right Sinus: No maxillary sinus tenderness or frontal sinus tenderness.  ?   Left Sinus: No maxillary sinus tenderness or frontal sinus tenderness.  ?   Mouth/Throat:  ?   Pharynx: Uvula midline.  ?Eyes:  ?   General: Lids are normal. Lids are  everted, no foreign bodies appreciated.  ?   Conjunctiva/sclera: Conjunctivae normal.  ?   Pupils: Pupils are equal, round, and reactive to light.  ?Neck:  ?   Thyroid: No thyroid mass or thyromegaly.  ?   Vascular: No carotid bruit.  ?   Trachea: Trachea normal.  ?Cardiovascular:  ?   Rate and Rhythm: Normal rate and regular rhythm.  ?   Pulses: Normal pulses.  ?   Heart sounds: Normal heart sounds, S1 normal and S2 normal. No murmur heard. ?  No friction rub. No gallop.  ?Pulmonary:  ?   Effort: Pulmonary effort is normal. No tachypnea or respiratory distress.  ?   Breath sounds: Normal breath sounds. No decreased breath sounds, wheezing, rhonchi or rales.  ?Abdominal:  ?   General: Bowel sounds are normal.  ?   Palpations: Abdomen is soft.  ?   Tenderness: There is no abdominal tenderness.  ?Musculoskeletal:  ?   Cervical back: Normal range of motion and neck supple.  ?   Right knee: Swelling, effusion, bony tenderness and crepitus present. No erythema. Decreased range of motion. Tenderness present over the medial joint line. No patellar tendon tenderness. No LCL laxity, MCL laxity, ACL laxity or PCL laxity. Normal alignment, normal meniscus and normal patellar mobility. Normal pulse.  ?   Instability Tests: Anterior drawer test negative. Posterior drawer test negative. Medial McMurray test negative and lateral McMurray test negative.  ?Skin: ?   General: Skin is warm and dry.  ?   Findings: No rash.  ?Neurological:  ?   Mental Status: She is alert.  ?Psychiatric:     ?   Mood and Affect: Mood is not anxious or depressed.     ?   Speech: Speech normal.     ?   Behavior: Behavior normal. Behavior is cooperative.     ?   Thought Content: Thought content normal.     ?   Judgment: Judgment normal.  ? ?   ?Results for orders placed or performed in visit on 04/16/21  ?CUP PACEART REMOTE DEVICE CHECK  ?Result Value Ref Range  ? Date Time Interrogation Session [phone]   ? Pulse Oncologist MERM   ?  Pulse Gen Model [id number] Adapta   ? Pulse Gen Serial Number [id number]   ? Clinic Name [facility]   ? Implantable Pulse Generator Type Implantable Pulse Generator   ? Implantable Pulse Generator Implant Date [id number]   ? Implantable Lead Manufacturer MERM   ? Implantable Lead Model 5076 CapSureFix Novus   ?  Implantable Lead Serial Number [id number]   ? Implantable Lead Implant Date [id number]   ? Implantable Lead Location Detail 1 APPENDAGE   ? Implantable Lead Location [id number]   ? Implantable Lead Manufacturer MERM   ? Implantable Lead Model 5092 CapSure SP Novus   ? Implantable Lead Serial Number [id number]   ? Implantable Lead Implant Date [id number]   ? Implantable Lead Location Detail 1 APEX   ? Implantable Lead Location [id number]   ? Lead Channel Setting Sensing Sensitivity 2.00 mV  ? Lead Channel Setting Pacing Amplitude 2.000 V  ? Lead Channel Setting Pacing Pulse Width 0.40 ms  ? Lead Channel Setting Pacing Amplitude 2.500 V  ? Lead Channel Impedance Value 450 ohm  ? Lead Channel Pacing Threshold Amplitude 0.375 V  ? Lead Channel Pacing Threshold Pulse Width 0.40 ms  ? Lead Channel Impedance Value 565 ohm  ? Lead Channel Pacing Threshold Amplitude 1.125 V  ? Lead Channel Pacing Threshold Pulse Width 0.40 ms  ? Battery Status OK   ? Battery Remaining Longevity 36 mo  ? Battery Voltage 2.75 V  ? Battery Impedance 1,843 ohm  ? Brady Statistic AP VP Percent 40 %  ? Brady Statistic AS VP Percent 60 %  ? Brady Statistic AP VS Percent 0 %  ? Brady Statistic AS VS Percent 0 %  ? ? ?This visit occurred during the SARS-CoV-2 public health emergency.  Safety protocols were in place, including screening questions prior to the visit, additional usage of staff PPE, and extensive cleaning of exam room while observing appropriate contact time as indicated for disinfecting solutions.  ? ?COVID 19 screen:  No recent travel or known exposure to COVID19 ?The patient denies respiratory symptoms of COVID 19 at this time. ?The  importance of social distancing was discussed today.  ? ?Assessment and Plan ? ?  ?Problem List Items Addressed This Visit   ? ? Acute knee pain - Primary  ?  Acute flare of chronic knee pain. ?Poor control ? ?Most likely s

## 2021-05-02 NOTE — Progress Notes (Signed)
Remote pacemaker transmission.   

## 2021-05-12 ENCOUNTER — Ambulatory Visit (HOSPITAL_COMMUNITY): Payer: Medicare HMO | Attending: Cardiology

## 2021-05-12 DIAGNOSIS — I441 Atrioventricular block, second degree: Secondary | ICD-10-CM | POA: Diagnosis not present

## 2021-05-12 DIAGNOSIS — I5032 Chronic diastolic (congestive) heart failure: Secondary | ICD-10-CM | POA: Diagnosis not present

## 2021-05-12 DIAGNOSIS — I10 Essential (primary) hypertension: Secondary | ICD-10-CM | POA: Insufficient documentation

## 2021-05-12 DIAGNOSIS — I2510 Atherosclerotic heart disease of native coronary artery without angina pectoris: Secondary | ICD-10-CM | POA: Insufficient documentation

## 2021-05-12 DIAGNOSIS — I340 Nonrheumatic mitral (valve) insufficiency: Secondary | ICD-10-CM | POA: Diagnosis not present

## 2021-05-12 DIAGNOSIS — I350 Nonrheumatic aortic (valve) stenosis: Secondary | ICD-10-CM | POA: Insufficient documentation

## 2021-05-12 LAB — ECHOCARDIOGRAM COMPLETE
AR max vel: 1 cm2
AV Area VTI: 1.04 cm2
AV Area mean vel: 1.12 cm2
AV Mean grad: 21 mmHg
AV Peak grad: 39.9 mmHg
Ao pk vel: 3.16 m/s
Area-P 1/2: 4.19 cm2
S' Lateral: 1.65 cm

## 2021-05-30 DIAGNOSIS — H43813 Vitreous degeneration, bilateral: Secondary | ICD-10-CM | POA: Diagnosis not present

## 2021-05-30 DIAGNOSIS — Z0100 Encounter for examination of eyes and vision without abnormal findings: Secondary | ICD-10-CM | POA: Diagnosis not present

## 2021-06-20 ENCOUNTER — Ambulatory Visit: Payer: Medicare HMO | Admitting: Internal Medicine

## 2021-06-20 ENCOUNTER — Encounter: Payer: Self-pay | Admitting: Internal Medicine

## 2021-06-20 VITALS — BP 114/80 | HR 76 | Ht 61.0 in | Wt 147.6 lb

## 2021-06-20 DIAGNOSIS — Z950 Presence of cardiac pacemaker: Secondary | ICD-10-CM | POA: Diagnosis not present

## 2021-06-20 DIAGNOSIS — I2510 Atherosclerotic heart disease of native coronary artery without angina pectoris: Secondary | ICD-10-CM | POA: Diagnosis not present

## 2021-06-20 DIAGNOSIS — I10 Essential (primary) hypertension: Secondary | ICD-10-CM | POA: Diagnosis not present

## 2021-06-20 DIAGNOSIS — I441 Atrioventricular block, second degree: Secondary | ICD-10-CM | POA: Diagnosis not present

## 2021-06-20 DIAGNOSIS — I442 Atrioventricular block, complete: Secondary | ICD-10-CM | POA: Diagnosis not present

## 2021-06-20 NOTE — Progress Notes (Signed)
PCP: Joaquim Nam, MD Primary Cardiologist: Clifton Mickayla Trouten Primary EP:  Kelsey Dunn is a 81 y.o. female who presents today for routine electrophysiology followup.  Since last being seen in our clinic, the patient reports doing reasonably well.  Her husband died in May 03, 2019.  Today, she denies symptoms of palpitations, chest pain, shortness of breath,  lower extremity edema, dizziness, presyncope, or syncope.  The patient is otherwise without complaint today.   Past Medical History:  Diagnosis Date   Arthritis    "fingers" (2013/03/21)   Basilar artery stenosis    90%   Bladder spasms    Blood transfusion 1960's   w/childbirth   CAD (coronary artery disease)    a. 06/2009 cath: severe stenosis RCA with placement drug eluting stent;  b. 08/2011 cath: normal left main, luminal LAD irregularities, luminal LCx irregularities, luminal RCA irregularities and no ISR to mid-RCA DES, LVEF 60-65%; c. 02/2013 nonischemic CL, EF 79%.   Family history of anesthesia complication    "first cousin had fever and almost died" (March 21, 2013)   Fibromyalgia    HLD (hyperlipidemia) 01/1997   Hypertension    Hypothyroidism 1990   IC (interstitial cystitis)    Labile hypertension 08/1991   Left-sided carotid artery obstruction    Malignant hyperthermia    "told to warn everybody before I had surgery that my 1st cousin had malignant hyperthermia; I've had symptoms of it too" (03-21-13)   Obesity    Pacemaker 11/11/2011   dual chamber pacemaker   Pneumonia    "twice" (03/21/13)   Second degree Mobitz II AV block    TIA (transient ischemic attack)    multiple/notes 08/02/2015   Past Surgical History:  Procedure Laterality Date   basilar artery angioplasty  09/04/08   sm right brachial hematoma   Bilateral Vert & Subclavian Angiogram  07/19/08   85-90% stenosis mid basilar art; occluded left vert artery; occluded left comm carotid artery w/ collat flow   BLADDER SUSPENSION  1974   CARDIAC  CATHETERIZATION  1993   normal   carotid arteriogram     bilat   Carotid US  11/93   Occl L comm carotid   Carotid US  05/02/1996   L comm occl R-ok   Carotid US  01/30/03   L occl  R less 40%   CATARACT EXTRACTION W/ INTRAOCULAR LENS IMPLANT Bilateral 2007-2008   Cath single vess dz  06/13/09   70% stenosis RCA EF 65-70%   CHOLECYSTECTOMY  1985   CORONARY ANGIOPLASTY WITH STENT PLACEMENT  06/19/09   DEXA  10/15/04   Troch -0.4 o/w pos   EMG  1/01   LE, RUE normal   EYE SURGERY     INSERT / REPLACE / REMOVE PACEMAKER  11/11/2011   MDT Adapta L implanted by Kelsey Johney Frame. [employer]   LEFT HEART CATHETERIZATION WITH CORONARY ANGIOGRAM N/A 08/10/2011   Procedure: LEFT HEART CATHETERIZATION WITH CORONARY ANGIOGRAM;  Surgeon: Kathleene Hazel, MD;  Location: [facility] CATH LAB;  Service: Cardiovascular;  Laterality: N/A;   LEFT HEART CATHETERIZATION WITH CORONARY ANGIOGRAM N/A 07/28/2013   Procedure: LEFT HEART CATHETERIZATION WITH CORONARY ANGIOGRAM;  Surgeon: Kathleene Hazel, MD;  Location[facility] C[facility] LAB;  Service: Cardiovascular;  Laterality: N/A;   PERMANENT PACEMAKER INSERTION N/A 11/11/2011   Procedure: PERMANENT PACEMAKER INSERTION;  Surgeon: Hillis Range, MD;  Location: [facility] CATH LAB;  Service: Cardiovascular;  Laterality: N/A;   RENAL ANGIOPLASTY  1993   normal  US RENAL/AORTA  12/93   Normal   VAGINAL HYSTERECTOMY  1974   dysmennorhea   Visual evoked response  11/93; 1/01   normal    ROS- all systems are reviewed and negative except as per HPI above  Current Outpatient Medications  Medication Sig Dispense Refill   acetaminophen (TYLENOL) 500 MG tablet Take 1,000 mg by mouth every 6 (six) hours as needed for moderate pain.      amLODipine (NORVASC) 5 MG tablet Take 1 tablet (5 mg total) by mouth 2 (two) times daily. 180 tablet 3   clopidogrel (PLAVIX) 75 MG tablet Take 1 tablet (75 mg total) by mouth daily. 90 tablet 3   hydrochlorothiazide (HYDRODIURIL) 25 MG tablet Take 1 tablet  (25 mg total) by mouth daily. 90 tablet 3   levothyroxine (SYNTHROID) 100 MCG tablet Take 1 tablet (100 mcg total) by mouth daily. 90 tablet 3   nitroGLYCERIN (NITROSTAT) 0.4 MG SL tablet Place 1 tablet (0.4 mg total) under the tongue every 5 (five) minutes as needed for chest pain (MAX 3 TABLETS). 25 tablet 1   olmesartan (BENICAR) 40 MG tablet Take 1 tablet (40 mg total) by mouth daily. 90 tablet 3   Vitamin D, Cholecalciferol, 25 MCG (1000 UT) TABS Take 1,000 Units by mouth daily.     No current facility-administered medications for this visit.    Physical Exam: Vitals:   06/20/21 1502  BP: 114/80  Pulse: 76  SpO2: 94%  Weight: 147 lb 9.6 oz (67 kg)  Height: 5\' 1"  (1.549 m)    GEN- The patient is well appearing, alert and oriented x 3 today.   Head- normocephalic, atraumatic Eyes-  Sclera clear, conjunctiva pink Ears- hearing intact Oropharynx- clear Lungs- Clear to ausculation bilaterally, normal work of breathing Chest- pacemaker pocket is well healed Heart- Regular rate and rhythm, no murmurs, rubs or gallops, PMI not laterally displaced GI- soft, NT, ND, + BS Extremities- no clubbing, cyanosis, or edema  Pacemaker interrogation- reviewed in detail today,  See PACEART report  ekg tracing ordered today is personally reviewed and shows sinus with V pacing  Echo 05/12/21- EF 55%, mild focal basal septal LV hypertrophy, moderate AS  Assessment and Plan:  1. Symptomatic complete heart block Normal pacemaker function See Pace Art report No changes today she is device dependant today  2. CAD No ischemic symptoms  3. HTN Stable No change required today  4. Chronic diastolic dysfunction Stable No change required today  Return to see EP APP in a year  07/12/21 MD, [facility] 06/20/2021 3:27 PM

## 2021-06-20 NOTE — Patient Instructions (Addendum)
Medication Instructions:  Your physician recommends that you continue on your current medications as directed. Please refer to the Current Medication list given to you today. *If you need a refill on your cardiac medications before your next appointment, please call your pharmacy*  Lab Work: None ordered. If you have labs (blood work) drawn today and your tests are completely normal, you will receive your results only by: MyChart Message (if you have MyChart) OR A paper copy in the mail If you have any lab test that is abnormal or we need to change your treatment, we will call you to review the results.  Testing/Procedures: None ordered.  Follow-Up: At [facility], you and your health needs are our priority.  As part of our continuing mission to provide you with exceptional heart care, we have created designated Provider Care Teams.  These Care Teams include your primary Cardiologist (physician) and Advanced Practice Providers (APPs -  Physician Assistants and Nurse Practitioners) who all work together to provide you with the care you need, when you need it.  Your next appointment:   Your physician wants you to follow-up in: one year Francis Dowse, PA-C  You will receive a reminder letter in the mail two months in advance. If you don't receive a letter, please call our office to schedule the follow-up appointment.  Remote monitoring is used to monitor your Pacemaker of ICD from home. This monitoring reduces the number of office visits required to check your device to one time per year. It allows [place] to keep an eye on the functioning of your device to ensure it is working properly. You are scheduled for a device check from home on 07/16/2021. You may send your transmission at any time that day. If you have a wireless device, the transmission will be sent automatically. After your physician reviews your transmission, you will receive a postcard with your next transmission date.    Important  Information About Sugar

## 2021-07-03 ENCOUNTER — Encounter: Payer: Self-pay | Admitting: Family Medicine

## 2021-07-03 ENCOUNTER — Ambulatory Visit (INDEPENDENT_AMBULATORY_CARE_PROVIDER_SITE_OTHER): Payer: Medicare HMO | Admitting: Family Medicine

## 2021-07-03 DIAGNOSIS — M25569 Pain in unspecified knee: Secondary | ICD-10-CM | POA: Diagnosis not present

## 2021-07-03 MED ORDER — PREDNISONE 10 MG PO TABS: ORAL_TABLET | ORAL | 0 refills | Status: DC

## 2021-07-03 NOTE — Progress Notes (Signed)
R knee pain is progressively worse.  Pain walking.  Not red or puffy but gait is affected from discomfort.  No trauma.  Prev imaging d/w pt about mid DJD at the R knee.  Pain with flex and ext, esp flex at or past 90 deg.  Ice helps.  Heat doesn't help.  She had h/o B hand and foot pain.    Meds, vitals, and allergies reviewed.   ROS: Per HPI unless specifically indicated in ROS section   Nad ncat R knee not bruised but ttp laterally.  Not tender to palpation medially. No locking or clicking on ROM.   Able to bear weight.   B IP joint changes noted in the hands without erythema.

## 2021-07-03 NOTE — Patient Instructions (Signed)
Prednisone with food.  Ice for 5 minutes at a time.  Update me if not better.  Take care.  Glad to see you.

## 2021-07-04 ENCOUNTER — Ambulatory Visit: Payer: Medicare HMO | Admitting: Family Medicine

## 2021-07-06 DIAGNOSIS — M25569 Pain in unspecified knee: Secondary | ICD-10-CM | POA: Insufficient documentation

## 2021-07-06 NOTE — Assessment & Plan Note (Addendum)
Discussed options.  Steroid cautions discussed with patient Prednisone with food.  Ice for 5 minutes at a time.  Update me if not better.  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

## 2021-07-14 ENCOUNTER — Telehealth: Payer: Self-pay

## 2021-07-14 NOTE — Telephone Encounter (Signed)
Patient called back, gave message. Patient verbalized understanding.

## 2021-07-14 NOTE — Telephone Encounter (Signed)
Patient is calling in stating that she seen Dr.[name] on 6/29. She wanted to inform him that she has finished the prednisone, and is doing so much better now. Kelsey Dunn states she is able to walk for about 15-20 minutes at a time. Wanted to know if he still wants to proceed with the MRI.

## 2021-07-14 NOTE — Telephone Encounter (Signed)
lmtcb

## 2021-07-14 NOTE — Telephone Encounter (Signed)
Glad she is better.  Given the improvement, I would hold off extra imaging at this point.  Please update me as needed.  Thanks.

## 2021-07-16 ENCOUNTER — Ambulatory Visit (INDEPENDENT_AMBULATORY_CARE_PROVIDER_SITE_OTHER): Payer: Medicare HMO

## 2021-07-16 DIAGNOSIS — I441 Atrioventricular block, second degree: Secondary | ICD-10-CM | POA: Diagnosis not present

## 2021-07-16 LAB — CUP PACEART REMOTE DEVICE CHECK
Battery Impedance: 1934 Ohm
Battery Remaining Longevity: 34 mo
Battery Voltage: 2.75 V
Brady Statistic AP VP Percent: 46 %
Brady Statistic AP VS Percent: 0 %
Brady Statistic AS VP Percent: 54 %
Brady Statistic AS VS Percent: 0 %
Date Time Interrogation Session: 20230712075745
Implantable Lead Implant Date: 20131106
Implantable Lead Implant Date: 20131106
Implantable Lead Location: 753859
Implantable Lead Location: 753860
Implantable Lead Model: 5076
Implantable Lead Model: 5092
Implantable Pulse Generator Implant Date: 20131106
Lead Channel Impedance Value: 442 Ohm
Lead Channel Impedance Value: 574 Ohm
Lead Channel Pacing Threshold Amplitude: 0.375 V
Lead Channel Pacing Threshold Amplitude: 1.125 V
Lead Channel Pacing Threshold Pulse Width: 0.4 ms
Lead Channel Pacing Threshold Pulse Width: 0.4 ms
Lead Channel Setting Pacing Amplitude: 2 V
Lead Channel Setting Pacing Amplitude: 2.5 V
Lead Channel Setting Pacing Pulse Width: 0.4 ms
Lead Channel Setting Sensing Sensitivity: 2 mV

## 2021-08-06 NOTE — Progress Notes (Signed)
Remote pacemaker transmission.   

## 2021-09-10 ENCOUNTER — Encounter: Payer: Self-pay | Admitting: Cardiovascular Disease

## 2021-09-10 ENCOUNTER — Ambulatory Visit: Payer: Medicare HMO | Attending: Cardiovascular Disease | Admitting: Cardiovascular Disease

## 2021-09-10 VITALS — BP 130/68 | HR 70 | Ht 61.0 in | Wt 148.2 lb

## 2021-09-10 DIAGNOSIS — I2510 Atherosclerotic heart disease of native coronary artery without angina pectoris: Secondary | ICD-10-CM | POA: Diagnosis not present

## 2021-09-10 DIAGNOSIS — I5032 Chronic diastolic (congestive) heart failure: Secondary | ICD-10-CM

## 2021-09-10 DIAGNOSIS — I441 Atrioventricular block, second degree: Secondary | ICD-10-CM

## 2021-09-10 DIAGNOSIS — I340 Nonrheumatic mitral (valve) insufficiency: Secondary | ICD-10-CM | POA: Diagnosis not present

## 2021-09-10 DIAGNOSIS — I350 Nonrheumatic aortic (valve) stenosis: Secondary | ICD-10-CM | POA: Diagnosis not present

## 2021-09-10 DIAGNOSIS — I10 Essential (primary) hypertension: Secondary | ICD-10-CM | POA: Diagnosis not present

## 2021-09-10 NOTE — Progress Notes (Signed)
Chief Complaint  Patient presents with   Follow-up    CAD   History of Present Illness: 81 yo female with h/o CAD, HTN, hyperlipidemia, hypothyroidism, AV block s/p PPM, CVA and basilar artery stenosis, mitral regurgitation and aortic stenosis here today for follow up. Left heart cath on 06/13/09 and she was found to have a severe stenosis in the RCA. There was minor disease in the LAD and Circumflex. A drug eluting stent was placed in the RCA. Repeat cardiac cath in August 2013 She was admitted to [facility] 08/10/11 with dizziness, chest pain and repeat cath showed stable disease. She was observed to have second degree AV block demonstrated with RBBB. Her coreg was stopped. She was discharged and wore an event monitor. This did not document arrhythmias or advanced AV block. There were no episodes of AV block documented. GXT was performed in our office 09/01/11. She was observed during the study to have a LBBB with exertion. She developed exertional symptoms of fatigue, SOB, and CP which corresponded to her LBBB. She was seen by Dr. Rayann Heman and continued to have episodes of palpitations and dizziness with moderate activity. A permanent pacemaker was placed on 11/11/11 by Dr. Rayann Heman. She has been undergoing cerebral angiograms for basilar artery stenosis and has had angiplasty of the basilar artery in August 2010. She is known to have an occluded left common carotid artery. She had a CVA in December 2012. She was Admitted to [facility] July 2015 with chest pain. Cardiac cath in 2015 with stable CAD. Echo May 2023 with LVEF=55-60%. Grade 1 diastolic dysfunction. Moderate aortic valve stenosis (mean gradient 21 mmHg). Mild MR.   She is here today for follow up. The patient denies any chest pain, dyspnea, palpitations, lower extremity edema, orthopnea, PND, dizziness, near syncope or syncope.    Primary Care Physician: Tonia Ghent, MD  Past Medical History:  Diagnosis Date   Arthritis    "fingers"  (2013/03/20)   Basilar artery stenosis    90%   Bladder spasms    Blood transfusion 1960's   w/childbirth   CAD (coronary artery disease)    a. 06/2009 cath: severe stenosis RCA with placement drug eluting stent;  b. 08/2011 cath: normal left main, luminal LAD irregularities, luminal LCx irregularities, luminal RCA irregularities and no ISR to mid-RCA DES, LVEF 60-65%; c. 02/2013 nonischemic CL, EF 79%.   Family history of anesthesia complication    "first cousin had fever and almost died" (03-20-2013)   Fibromyalgia    HLD (hyperlipidemia) 01/1997   Hypertension    Hypothyroidism 1990   IC (interstitial cystitis)    Labile hypertension 08/1991   Left-sided carotid artery obstruction    Malignant hyperthermia    "told to warn everybody before I had surgery that my 1st cousin had malignant hyperthermia; I've had symptoms of it too" (03/20/2013)   Obesity    Pacemaker 11/11/2011   dual chamber pacemaker   Pneumonia    "twice" (2013/03/20)   Second degree Mobitz II AV block    TIA (transient ischemic attack)    multiple/notes 08/02/2015    Past Surgical History:  Procedure Laterality Date   basilar artery angioplasty  09/04/08   sm right brachial hematoma   Bilateral Vert & Subclavian Angiogram  07/19/08   85-90% stenosis mid basilar art; occluded left vert artery; occluded left comm carotid artery w/ collat flow   [place]   normal  carotid arteriogram     bilat   Carotid US  11/93   Occl L comm carotid   Carotid US  4/98   L comm occl R-ok   Carotid US  01/30/03   L occl  R less 40%   CATARACT EXTRACTION W/ INTRAOCULAR LENS IMPLANT Bilateral 2007-2008   Cath single vess dz  06/13/09   70% stenosis RCA EF 65-70%   CHOLECYSTECTOMY  1985   CORONARY ANGIOPLASTY WITH STENT PLACEMENT  06/19/09   DEXA  10/15/04   Troch -0.4 o/w pos   EMG  1/01   LE, RUE normal   EYE SURGERY     INSERT / REPLACE / REMOVE PACEMAKER  11/11/2011   MDT  Adapta L implanted by Dr Rayann Heman. [employer]   LEFT HEART CATHETERIZATION WITH CORONARY ANGIOGRAM N/A 08/10/2011   Procedure: LEFT HEART CATHETERIZATION WITH CORONARY ANGIOGRAM;  Surgeon: Burnell Blanks, MD;  Location: [facility] CATH LAB;  Service: Cardiovascular;  Laterality: N/A;   LEFT HEART CATHETERIZATION WITH CORONARY ANGIOGRAM N/A 07/28/2013   Procedure: LEFT HEART CATHETERIZATION WITH CORONARY ANGIOGRAM;  Surgeon: Burnell Blanks, MD;  Location: [facility] CATH LAB;  Service: Cardiovascular;  Laterality: N/A;   PERMANENT PACEMAKER INSERTION N/A 11/11/2011   Procedure: PERMANENT PACEMAKER INSERTION;  Surgeon: Thompson Grayer, MD;  Location: [facility] CATH LAB;  Service: Cardiovascular;  Laterality: N/A;   RENAL ANGIOPLASTY  1993   normal   US RENAL/AORTA  12/93   Normal   VAGINAL HYSTERECTOMY  1974   dysmennorhea   Visual evoked response  11/93; 1/01   normal    Current Outpatient Medications  Medication Sig Dispense Refill   acetaminophen (TYLENOL) 500 MG tablet Take 1,000 mg by mouth every 6 (six) hours as needed for moderate pain.      amLODipine (NORVASC) 5 MG tablet Take 1 tablet (5 mg total) by mouth 2 (two) times daily. 180 tablet 3   clopidogrel (PLAVIX) 75 MG tablet Take 1 tablet (75 mg total) by mouth daily. 90 tablet 3   hydrochlorothiazide (HYDRODIURIL) 25 MG tablet Take 1 tablet (25 mg total) by mouth daily. 90 tablet 3   levothyroxine (SYNTHROID) 100 MCG tablet Take 1 tablet (100 mcg total) by mouth daily. 90 tablet 3   nitroGLYCERIN (NITROSTAT) 0.4 MG SL tablet Place 1 tablet (0.4 mg total) under the tongue every 5 (five) minutes as needed for chest pain (MAX 3 TABLETS). 25 tablet 1   olmesartan (BENICAR) 40 MG tablet Take 1 tablet (40 mg total) by mouth daily. 90 tablet 3   Vitamin D, Cholecalciferol, 25 MCG (1000 UT) TABS Take 1,000 Units by mouth daily.     No current facility-administered medications for this visit.    Allergies  Allergen Reactions   Anesthetics, Amide  Other (See Comments)    REACTION:FEVER, SYNCOPE   Codeine Nausea And Vomiting and Other (See Comments)    Hallucinations   Nitrofurantoin Nausea Only and Other (See Comments)    Hallucinations   Orphenadrine Citrate Nausea And Vomiting and Other (See Comments)     Hallucination   Pravastatin Other (See Comments)    Myalgias   Simvastatin Other (See Comments)    myalgias   Ibuprofen     Would avoid if possible   Pentosan Polysulfate Sodium Rash and Other (See Comments)     rash/ severe headache    Social History   Socioeconomic History   Marital status: Widowed    Spouse name: Not on file   Number of  children: 1   Years of education: Not on file   Highest education level: Not on file  Occupational History   Occupation: Bookkeeper-part time   Occupation: retired    Associate Professor: RETIRED  Tobacco Use   Smoking status: Never   Smokeless tobacco: Never  Vaping Use   Vaping Use: Never used  Substance and Sexual Activity   Alcohol use: No    Alcohol/week: 0.0 standard drinks of alcohol   Drug use: No   Sexual activity: Not Currently    Birth control/protection: Post-menopausal  Other Topics Concern   Not on file  Social History Narrative   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   Widowed 2021.  Was married 1962; 1 child   Drinks coffee and coke daily    Social Determinants of Health   Financial Resource Strain: Low Risk  (12/20/2020)   Overall Financial Resource Strain (CARDIA)    Difficulty of Paying Living Expenses: Not hard at all  Food Insecurity: No Food Insecurity (12/20/2020)   Hunger Vital Sign    Worried About Running Out of Food in the Last Year: Never true    Ran Out of Food in the Last Year: Never true  Transportation Needs: No Transportation Needs (12/19/2019)   PRAPARE - Administrator, Civil Service (Medical): No    Lack of Transportation (Non-Medical): No  Physical Activity: Insufficiently Active (12/20/2020)    Exercise Vital Sign    Days of Exercise per Week: 7 days    Minutes of Exercise per Session: 20 min  Stress: No Stress Concern Present (12/20/2020)   [employer] of Occupational Health - Occupational Stress Questionnaire    Feeling of Stress : Not at all  Social Connections: Moderately Isolated (12/20/2020)   Social Connection and Isolation Panel [NHANES]    Frequency of Communication with Friends and Family: More than three times a week    Frequency of Social Gatherings with Friends and Family: Three times a week    Attends Religious Services: More than 4 times per year    Active Member of Clubs or Organizations: No    Attends Banker Meetings: Never    Marital Status: Widowed  Intimate Partner Violence: Not At Risk (12/20/2020)   Humiliation, Afraid, Rape, and Kick questionnaire    Fear of Current or Ex-Partner: No    Emotionally Abused: No    Physically Abused: No    Sexually Abused: No    Family History  Problem Relation Age of Onset   Lung cancer Father    Heart attack Mother    Lymphoma Mother        s/p chemo   Stroke Mother        x 2   Diabetes Brother    Breast cancer Maternal Aunt    Colon cancer Neg Hx     Review of Systems:  As stated in the HPI and otherwise negative.   BP 130/68   Pulse 70   Ht 5\' 1"  (1.549 m)   Wt 148 lb 3.2 oz (67.2 kg)   SpO2 97%   BMI 28.00 kg/m   Physical Examination: General: Well developed, well nourished, NAD  HEENT: OP clear, mucus membranes moist  SKIN: warm, dry. No rashes. Neuro: No focal deficits  Musculoskeletal: Muscle strength 5/5 all ext  Psychiatric: Mood and affect normal  Neck: No JVD, no carotid bruits, no thyromegaly, no lymphadenopathy.  Lungs:Clear bilaterally, no wheezes, rhonci, crackles Cardiovascular: Regular rate and  rhythm. Systolic murmur.  Abdomen:Soft. Bowel sounds present. Non-tender.  Extremities: No lower extremity edema. Pulses are 2 + in the bilateral DP/PT.  Cardiac cath  07/27/13: Left main: No obstructive disease.  Left Anterior Descending Artery: Large caliber vessel that courses to the apex. The mid vessel has diffuse 20% stenosis. There is a very small caliber diagonal branch with proximal 40% stenosis. There are no flow limiting lesions noted.  Circumflex Artery: Moderate caliber vessel with moderate caliber obtuse marginal branch. No obstructive disease.  Right Coronary Artery: Large caliber vessel with 20% mid stenosis, patent distal stent without restenosis.  Left Ventricular Angiogram: LVEF=65-70%.  Impression:  1. Single vessel CAD with patent stent RCA  2. Normal LV function  3. Non-cardiac chest pain   Echo May 2023:   1. Left ventricular ejection fraction, by estimation, is 55 to 60%. The  left ventricle has normal function. The left ventricle has no regional  wall motion abnormalities. Mild focal basal septal left ventricular  hypertrophy. Left ventricular diastolic  parameters are consistent with Grade I diastolic dysfunction (impaired  relaxation). The average left ventricular global longitudinal strain is  -21.9 %. The global longitudinal strain is normal.   2. Right ventricular systolic function is normal. The right ventricular  size is normal. There is normal pulmonary artery systolic pressure. The  estimated right ventricular systolic pressure is [id number] mmHg.   3. Left atrial size was mildly dilated.   4. The mitral valve is normal in structure. Mild mitral valve  regurgitation. No evidence of mitral stenosis.   5. The aortic valve is tricuspid. There is moderate calcification of the  aortic valve. Aortic valve regurgitation is not visualized. Moderate  aortic valve stenosis. Aortic valve area, by VTI measures 1.04 cm. Aortic  valve mean gradient measures 21.0  mmHg.   6. The inferior vena cava is normal in size with greater than 50%  respiratory variability, suggesting right atrial pressure of 3 mmHg.   EKG:  EKG is not ordered  today. The ekg ordered today demonstrates   Recent Labs: 02/21/2021: ALT 9; BUN 26; Creatinine, Ser 0.90; Potassium 4.0; Sodium 141; TSH 0.66   Lipid Panel    Component Value Date/Time   CHOL 198 02/21/2021 0806   TRIG 137.0 02/21/2021 0806   HDL 51.40 02/21/2021 0806   CHOLHDL 4 02/21/2021 0806   VLDL 27.4 02/21/2021 0806   LDLCALC 120 (H) 02/21/2021 0806   LDLDIRECT 145.0 10/01/2014 1336     Wt Readings from Last 3 Encounters:  09/10/21 148 lb 3.2 oz (67.2 kg)  07/03/21 147 lb (66.7 kg)  06/20/21 147 lb 9.6 oz (67 kg)     Other studies Reviewed: Additional studies/ records that were reviewed today include: . Review of the above records demonstrates:    Assessment and Plan:   1. CAD without angina: Last cath in July 2015 with stable CAD. NO chest pain. Continue Plavix given prior CVA and history of coronary stent.  She does not tolerate statins.   2. High grade symptomatic AV block: s/p PPM 11/10/11 per Dr. Rayann Heman. She is followed in the pacer clinic.   3. HTN: BP is controlled. No changes  4. Chronic diastolic CHF: Weight is stable. No volume overload on exam.   5. Aortic valve stenosis: Moderate by echo May 2022. Repeat echo May 2024  6. Mitral valve regurgitation: Mild by echo in May 2023.    Labs/ tests ordered today include:   Orders Placed This Encounter  Procedures  ECHOCARDIOGRAM COMPLETE    Disposition:   F/U with me in 12  months  Signed, Verne Carrow, MD 09/10/2021 10:26 AM    [facility] [address] [place], [place], [place]  [address] Phone: [phone]; Fax: [phone]

## 2021-09-10 NOTE — Patient Instructions (Signed)
Medication Instructions:  Your physician recommends that you continue on your current medications as directed. Please refer to the Current Medication list given to you today.  *If you need a refill on your cardiac medications before your next appointment, please call your pharmacy*   Lab Work: none If you have labs (blood work) drawn today and your tests are completely normal, you will receive your results only by: MyChart Message (if you have MyChart) OR A paper copy in the mail If you have any lab test that is abnormal or we need to change your treatment, we will call you to review the results.   Testing/Procedures: Your physician has requested that you have an echocardiogram. Echocardiography is a painless test that uses sound waves to create images of your heart. It provides your doctor with information about the size and shape of your heart and how well your heart's chambers and valves are working. This procedure takes approximately one hour. There are no restrictions for this procedure. To be done in May 2024   Follow-Up: At [facility], you and your health needs are our priority.  As part of our continuing mission to provide you with exceptional heart care, we have created designated Provider Care Teams.  These Care Teams include your primary Cardiologist (physician) and Advanced Practice Providers (APPs -  Physician Assistants and Nurse Practitioners) who all work together to provide you with the care you need, when you need it.  We recommend signing up for the patient portal called "MyChart".  Sign up information is provided on this After Visit Summary.  MyChart is used to connect with patients for Virtual Visits (Telemedicine).  Patients are able to view lab/test results, encounter notes, upcoming appointments, etc.  Non-urgent messages can be sent to your provider as well.   To learn more about what you can do with MyChart, go to [email or web address].    Your next  appointment:   12 month(s)  The format for your next appointment:   In Person  Provider:   Verne Carrow, MD     Other Instructions    Important Information About Sugar

## 2021-10-06 ENCOUNTER — Other Ambulatory Visit: Payer: Self-pay | Admitting: Family Medicine

## 2021-10-15 ENCOUNTER — Ambulatory Visit (INDEPENDENT_AMBULATORY_CARE_PROVIDER_SITE_OTHER): Payer: Medicare HMO

## 2021-10-15 DIAGNOSIS — I441 Atrioventricular block, second degree: Secondary | ICD-10-CM | POA: Diagnosis not present

## 2021-10-15 LAB — CUP PACEART REMOTE DEVICE CHECK
Battery Impedance: 2030 Ohm
Battery Remaining Longevity: 33 mo
Battery Voltage: 2.75 V
Brady Statistic AP VP Percent: 43 %
Brady Statistic AP VS Percent: 0 %
Brady Statistic AS VP Percent: 57 %
Brady Statistic AS VS Percent: 0 %
Date Time Interrogation Session: 20231011090452
Implantable Lead Implant Date: 20131106
Implantable Lead Implant Date: 20131106
Implantable Lead Location: 753859
Implantable Lead Location: 753860
Implantable Lead Model: 5076
Implantable Lead Model: 5092
Implantable Pulse Generator Implant Date: 20131106
Lead Channel Impedance Value: 428 Ohm
Lead Channel Impedance Value: 614 Ohm
Lead Channel Pacing Threshold Amplitude: 0.375 V
Lead Channel Pacing Threshold Amplitude: 1.25 V
Lead Channel Pacing Threshold Pulse Width: 0.4 ms
Lead Channel Pacing Threshold Pulse Width: 0.4 ms
Lead Channel Setting Pacing Amplitude: 2 V
Lead Channel Setting Pacing Amplitude: 2.5 V
Lead Channel Setting Pacing Pulse Width: 0.4 ms
Lead Channel Setting Sensing Sensitivity: 2 mV

## 2021-10-28 ENCOUNTER — Ambulatory Visit (INDEPENDENT_AMBULATORY_CARE_PROVIDER_SITE_OTHER): Payer: Medicare HMO

## 2021-10-28 DIAGNOSIS — Z23 Encounter for immunization: Secondary | ICD-10-CM

## 2021-10-29 NOTE — Progress Notes (Signed)
Remote pacemaker transmission.   

## 2021-12-24 ENCOUNTER — Ambulatory Visit (INDEPENDENT_AMBULATORY_CARE_PROVIDER_SITE_OTHER): Payer: Medicare HMO

## 2021-12-24 VITALS — BP 123/82 | HR 67 | Temp 97.9°F | Ht 59.0 in | Wt 141.2 lb

## 2021-12-24 DIAGNOSIS — Z0000 Encounter for general adult medical examination without abnormal findings: Secondary | ICD-10-CM | POA: Diagnosis not present

## 2021-12-24 NOTE — Patient Instructions (Addendum)
Kelsey Dunn , Thank you for taking time to come for your Medicare Wellness Visit. I appreciate your ongoing commitment to your health goals. Please review the following plan we discussed and let me know if I can assist you in the future.   These are the goals we discussed:  Goals Addressed             This Visit's Progress    Follow up with Primary Care Provider       As needed.  Maintain healthy lifestyle; stay active, stay hydrated, healthy diet.     Patient Stated   On track    Would like to maintain current routine         This is a list of the screening recommended for you and due dates:  Health Maintenance  Topic Date Due   DTaP/Tdap/Td vaccine (2 - Tdap) 12/06/2007   Zoster (Shingles) Vaccine (1 of 2) 07/28/2022*   Hepatitis C Screening: USPSTF Recommendation to screen - Ages 18-79 yo.  12/25/2022*   Medicare Annual Wellness Visit  12/25/2022   Pneumonia Vaccine  Completed   Flu Shot  Completed   DEXA scan (bone density measurement)  Completed   HPV Vaccine  Aged Out   COVID-19 Vaccine  Discontinued  *Topic was postponed. The date shown is not the original due date.    Advanced directives: End of life planning; Advanced aging; Advanced directives discussed.  No HCPOA/Living Will.  Additional information declined at this time.  Conditions/risks identified: none new.  Next appointment: Follow up in one year for your annual wellness visit.   Preventive Care 23 Years and Older, Female Preventive care refers to lifestyle choices and visits with your health care provider that can promote health and wellness. What does preventive care include? A yearly physical exam. This is also called an annual well check. Dental exams once or twice a year. Routine eye exams. Ask your health care provider how often you should have your eyes checked. Personal lifestyle choices, including: Daily care of your teeth and gums. Regular physical activity. Eating a healthy diet. Avoiding  tobacco and drug use. Limiting alcohol use. Practicing safe sex. Taking low-dose aspirin every day. Taking vitamin and mineral supplements as recommended by your health care provider. What happens during an annual well check? The services and screenings done by your health care provider during your annual well check will depend on your age, overall health, lifestyle risk factors, and family history of disease. Counseling  Your health care provider may ask you questions about your: Alcohol use. Tobacco use. Drug use. Emotional well-being. Home and relationship well-being. Sexual activity. Eating habits. History of falls. Memory and ability to understand (cognition). Work and work Astronomer. Reproductive health. Screening  You may have the following tests or measurements: Height, weight, and BMI. Blood pressure. Lipid and cholesterol levels. These may be checked every 5 years, or more frequently if you are over 65 years old. Skin check. Lung cancer screening. You may have this screening every year starting at age 29 if you have a 30-pack-year history of smoking and currently smoke or have quit within the past 15 years. Fecal occult blood test (FOBT) of the stool. You may have this test every year starting at age 22. Flexible sigmoidoscopy or colonoscopy. You may have a sigmoidoscopy every 5 years or a colonoscopy every 10 years starting at age 24. Hepatitis C blood test. Hepatitis B blood test. Sexually transmitted disease (STD) testing. Diabetes screening. This is done by  checking your blood sugar (glucose) after you have not eaten for a while (fasting). You may have this done every 1-3 years. Bone density scan. This is done to screen for osteoporosis. You may have this done starting at age 30. Mammogram. This may be done every 1-2 years. Talk to your health care provider about how often you should have regular mammograms. Talk with your health care provider about your test  results, treatment options, and if necessary, the need for more tests. Vaccines  Your health care provider may recommend certain vaccines, such as: Influenza vaccine. This is recommended every year. Tetanus, diphtheria, and acellular pertussis (Tdap, Td) vaccine. You may need a Td booster every 10 years. Zoster vaccine. You may need this after age 17. Pneumococcal 13-valent conjugate (PCV13) vaccine. One dose is recommended after age 85. Pneumococcal polysaccharide (PPSV23) vaccine. One dose is recommended after age 72. Talk to your health care provider about which screenings and vaccines you need and how often you need them. This information is not intended to replace advice given to you by your health care provider. Make sure you discuss any questions you have with your health care provider. Document Released: 01/18/2015 Document Revised: 09/11/2015 Document Reviewed: 10/23/2014 Elsevier Interactive Patient Education  2017 [place] Prevention in the Home Falls can cause injuries. They can happen to people of all ages. There are many things you can do to make your home safe and to help prevent falls. What can I do on the outside of my home? Regularly fix the edges of walkways and driveways and fix any cracks. Remove anything that might make you trip as you walk through a door, such as a raised step or threshold. Trim any bushes or trees on the path to your home. Use bright outdoor lighting. Clear any walking paths of anything that might make someone trip, such as rocks or tools. Regularly check to see if handrails are loose or broken. Make sure that both sides of any steps have handrails. Any raised decks and porches should have guardrails on the edges. Have any leaves, snow, or ice cleared regularly. Use sand or salt on walking paths during winter. Clean up any spills in your garage right away. This includes oil or grease spills. What can I do in the bathroom? Use night  lights. Install grab bars by the toilet and in the tub and shower. Do not use towel bars as grab bars. Use non-skid mats or decals in the tub or shower. If you need to sit down in the shower, use a plastic, non-slip stool. Keep the floor dry. Clean up any water that spills on the floor as soon as it happens. Remove soap buildup in the tub or shower regularly. Attach bath mats securely with double-sided non-slip rug tape. Do not have throw rugs and other things on the floor that can make you trip. What can I do in the bedroom? Use night lights. Make sure that you have a light by your bed that is easy to reach. Do not use any sheets or blankets that are too big for your bed. They should not hang down onto the floor. Have a firm chair that has side arms. You can use this for support while you get dressed. Do not have throw rugs and other things on the floor that can make you trip. What can I do in the kitchen? Clean up any spills right away. Avoid walking on wet floors. Keep items that you use a  lot in easy-to-reach places. If you need to reach something above you, use a strong step stool that has a grab bar. Keep electrical cords out of the way. Do not use floor polish or wax that makes floors slippery. If you must use wax, use non-skid floor wax. Do not have throw rugs and other things on the floor that can make you trip. What can I do with my stairs? Do not leave any items on the stairs. Make sure that there are handrails on both sides of the stairs and use them. Fix handrails that are broken or loose. Make sure that handrails are as long as the stairways. Check any carpeting to make sure that it is firmly attached to the stairs. Fix any carpet that is loose or worn. Avoid having throw rugs at the top or bottom of the stairs. If you do have throw rugs, attach them to the floor with carpet tape. Make sure that you have a light switch at the top of the stairs and the bottom of the stairs. If  you do not have them, ask someone to add them for you. What else can I do to help prevent falls? Wear shoes that: Do not have high heels. Have rubber bottoms. Are comfortable and fit you well. Are closed at the toe. Do not wear sandals. If you use a stepladder: Make sure that it is fully opened. Do not climb a closed stepladder. Make sure that both sides of the stepladder are locked into place. Ask someone to hold it for you, if possible. Clearly mark and make sure that you can see: Any grab bars or handrails. First and last steps. Where the edge of each step is. Use tools that help you move around (mobility aids) if they are needed. These include: Canes. Walkers. Scooters. Crutches. Turn on the lights when you go into a dark area. Replace any light bulbs as soon as they burn out. Set up your furniture so you have a clear path. Avoid moving your furniture around. If any of your floors are uneven, fix them. If there are any pets around you, be aware of where they are. Review your medicines with your doctor. Some medicines can make you feel dizzy. This can increase your chance of falling. Ask your doctor what other things that you can do to help prevent falls. This information is not intended to replace advice given to you by your health care provider. Make sure you discuss any questions you have with your health care provider. Document Released: 10/18/2008 Document Revised: 05/30/2015 Document Reviewed: 01/26/2014 Elsevier Interactive Patient Education  2017 [employer].

## 2021-12-24 NOTE — Progress Notes (Signed)
Subjective:   Kelsey Dunn is a 81 y.o. female who presents for Medicare Annual (Subsequent) preventive examination.  Review of Systems    No ROS.  Medicare Wellness Virtual Visit.  Visual/audio telehealth visit, UTA vital signs.   See social history for additional risk factors.   Cardiac Risk Factors include: advanced age (>80men, >28 women)     Objective:    Today's Vitals   12/24/21 1119  BP: 123/82  Pulse: 67  Temp: 97.9 F (36.6 C)  SpO2: 98%  Weight: 141 lb 3.2 oz (64 kg)  Height: 4\' 11"  (1.499 m)   Body mass index is 28.52 kg/m.     12/24/2021   11:34 AM 12/20/2020   11:20 AM 12/19/2019   11:15 AM 11/18/2018    9:56 AM 08/20/2017   10:58 AM 08/19/2016   10:52 AM 03/08/2016    3:06 PM  Advanced Directives  Does Patient Have a Medical Advance Directive? No Yes Yes No No No No;Yes  Type of Advance Directive  Living will Healthcare Power of [place];Living will    Healthcare Power of Attorney   Does patient want to make changes to medical advance directive?  Yes (MAU/Ambulatory/Procedural Areas - Information given)       Copy of Healthcare Power of Attorney in Chart?   No - copy requested      Would patient like information on creating a medical advance directive? No - Patient declined Yes (MAU/Ambulatory/Procedural Areas - Information given)  No - Patient declined No - Patient declined       Significant value    Current Medications (verified) Outpatient Encounter Medications as of 12/24/2021  Medication Sig   acetaminophen (TYLENOL) 500 MG tablet Take 1,000 mg by mouth every 6 (six) hours as needed for moderate pain.    amLODipine (NORVASC) 5 MG tablet Take 1 tablet (5 mg total) by mouth 2 (two) times daily.   clopidogrel (PLAVIX) 75 MG tablet Take 1 tablet (75 mg total) by mouth daily.   hydrochlorothiazide (HYDRODIURIL) 25 MG tablet Take 1 tablet (25 mg total) by mouth daily.   levothyroxine (SYNTHROID) 100 MCG tablet Take 1 tablet (100 mcg total) by mouth  daily.   nitroGLYCERIN (NITROSTAT) 0.4 MG SL tablet PLACE 1 TABLET UNDER THE TONGUE EVERY 5 (FIVE) MINUTES AS NEEDED FOR CHEST PAIN (MAX 3 TABLETS)   olmesartan (BENICAR) 40 MG tablet Take 1 tablet (40 mg total) by mouth daily.   Vitamin D, Cholecalciferol, 25 MCG (1000 UT) TABS Take 1,000 Units by mouth daily.   No facility-administered encounter medications on file as of 12/24/2021.    Allergies (verified) Anesthetics, amide; Codeine; Nitrofurantoin; Orphenadrine citrate; Pravastatin; Simvastatin; Ibuprofen; and Pentosan polysulfate sodium   History: Past Medical History:  Diagnosis Date   Arthritis    "fingers" (2013-03-19)   Basilar artery stenosis    90%   Bladder spasms    Blood transfusion 1960's   w/childbirth   CAD (coronary artery disease)    a. 06/2009 cath: severe stenosis RCA with placement drug eluting stent;  b. 08/2011 cath: normal left main, luminal LAD irregularities, luminal LCx irregularities, luminal RCA irregularities and no ISR to mid-RCA DES, LVEF 60-65%; c. 02/2013 nonischemic CL, EF 79%.   Family history of anesthesia complication    "first cousin had fever and almost died" (03/19/13)   Fibromyalgia    HLD (hyperlipidemia) 01/1997   Hypertension    Hypothyroidism 1990   IC (interstitial cystitis)    Labile hypertension 08/1991  Left-sided carotid artery obstruction    Malignant hyperthermia    "told to warn everybody before I had surgery that my 1st cousin had malignant hyperthermia; I've had symptoms of it too" (02/27/2013)   Obesity    Pacemaker 11/11/2011   dual chamber pacemaker   Pneumonia    "twice" (02/27/2013)   Second degree Mobitz II AV block    TIA (transient ischemic attack)    multiple/notes 08/02/2015   Past Surgical History:  Procedure Laterality Date   basilar artery angioplasty  09/04/08   sm right brachial hematoma   Bilateral Vert & Subclavian Angiogram  07/19/08   85-90% stenosis mid basilar art; occluded left vert artery; occluded  left comm carotid artery w/ collat flow   BLADDER SUSPENSION  1974   CARDIAC CATHETERIZATION  1993   normal   carotid arteriogram     bilat   Carotid US  11/93   Occl L comm carotid   Carotid US  4/98   L comm occl R-ok   Carotid US  01/30/03   L occl  R less 40%   CATARACT EXTRACTION W/ INTRAOCULAR LENS IMPLANT Bilateral 2007-2008   Cath single vess dz  06/13/09   70% stenosis RCA EF 65-70%   CHOLECYSTECTOMY  1985   CORONARY ANGIOPLASTY WITH STENT PLACEMENT  06/19/09   DEXA  10/15/04   Troch -0.4 o/w pos   EMG  1/01   LE, RUE normal   EYE SURGERY     INSERT / REPLACE / REMOVE PACEMAKER  11/11/2011   MDT Adapta L implanted by Dr Johney Frame. [employer]   LEFT HEART CATHETERIZATION WITH CORONARY ANGIOGRAM N/A 08/10/2011   Procedure: LEFT HEART CATHETERIZATION WITH CORONARY ANGIOGRAM;  Surgeon: Kathleene Hazel, MD;  Location[facility] CATH LAB;  Service: Cardiovascular;  Laterality: N/A;   LEFT HEART CATHETERIZATION WITH CORONARY ANGIOGRAM N/A 07/28/2013   Procedure: LEFT HEART CATHETERIZATION WITH CORONARY ANGIOGRAM;  Surgeon: Kathleene Hazel, MD;  Location: [facility] CATH LAB;  Service: Cardiovascular;  Laterality: N/A;   PERMANENT PACEMAKER INSERTION N/A 11/11/2011   Procedure: PERMANENT PACEMAKER INSERTION;  Surgeon: Hillis Range, MD;  Location: [facility] CATH LAB;  Service: Cardiovascular;  Laterality: N/A;   RENAL ANGIOPLASTY  1993   normal   US RENAL/AORTA  12/93   Normal   VAGINAL HYSTERECTOMY  1974   dysmennorhea   Visual evoked response  11/93; 1/01   normal   Family History  Problem Relation Age of Onset   Lung cancer Father    Heart attack Mother    Lymphoma Mother        s/p chemo   Stroke Mother        x 2   Diabetes Brother    Breast cancer Maternal Aunt    Colon cancer Neg Hx    Social History   Socioeconomic History   Marital status: Widowed    Spouse name: Not on file   Number of children: 1   Years of education: Not on file   Highest education level: Not on file   Occupational History   Occupation: Bookkeeper-part time   Occupation: retired    Associate Professor: RETIRED  Tobacco Use   Smoking status: Never   Smokeless tobacco: Never  Vaping Use   Vaping Use: Never used  Substance and Sexual Activity   Alcohol use: No    Alcohol/week: 0.0 standard drinks of alcohol   Drug use: No   Sexual activity: Not Currently    Birth control/protection: Post-menopausal  Other Topics Concern   Not on file  Social History Narrative   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   Widowed 2021.  Was married 1962; 1 child   Drinks coffee and coke daily    Social Determinants of Health   Financial Resource Strain: Low Risk  (12/24/2021)   Overall Financial Resource Strain (CARDIA)    Difficulty of Paying Living Expenses: Not hard at all  Food Insecurity: No Food Insecurity (12/24/2021)   Hunger Vital Sign    Worried About Running Out of Food in the Last Year: Never true    Ran Out of Food in the Last Year: Never true  Transportation Needs: No Transportation Needs (12/24/2021)   PRAPARE - Hydrologist (Medical): No    Lack of Transportation (Non-Medical): No  Physical Activity: Insufficiently Active (12/24/2021)   Exercise Vital Sign    Days of Exercise per Week: 7 days    Minutes of Exercise per Session: 20 min  Stress: No Stress Concern Present (12/24/2021)   [place]    Feeling of Stress : Not at all  Social Connections: Moderately Isolated (12/24/2021)   Social Connection and Isolation Panel [NHANES]    Frequency of Communication with Friends and Family: More than three times a week    Frequency of Social Gatherings with Friends and Family: Three times a week    Attends Religious Services: More than 4 times per year    Active Member of Clubs or Organizations: No    Attends Archivist Meetings: Never    Marital Status:  Widowed    Tobacco Counseling Counseling given: Not Answered   Clinical Intake:  Pre-visit preparation completed: Yes        Diabetes: No  How often do you need to have someone help you when you read instructions, pamphlets, or other written materials from your doctor or pharmacy?: 1 - Never    Interpreter Needed?: No      Activities of Daily Living    12/24/2021   11:35 AM  In your present state of health, do you have any difficulty performing the following activities:  Hearing? 0  Vision? 0  Difficulty concentrating or making decisions? 0  Walking or climbing stairs? 1  Comment Paces self when walking  Dressing or bathing? 0  Doing errands, shopping? 0  Preparing Food and eating ? N  Using the Toilet? N  In the past six months, have you accidently leaked urine? N  Do you have problems with loss of bowel control? N  Managing your Medications? N  Managing your Finances? N  Housekeeping or managing your Housekeeping? N    Patient Care Team: Tonia Ghent, MD as PCP - General (Family Medicine) Burnell Blanks, MD as PCP - Cardiology (Cardiology) Burnell Blanks, MD as Consulting Physician (Cardiology) Thompson Grayer, MD as Consulting Physician (Cardiology) Birder Robson, MD as Referring Physician (Ophthalmology)  Indicate any recent Medical Services you may have received from other than [facility] providers in the past year (date may be approximate).     Assessment:   This is a routine wellness examination for [place].  I connected with  Asencion Noble on 12/24/21 by a audio enabled telemedicine application and verified that I am speaking with the correct person using two identifiers.  Patient Location: Home  Provider Location: Office/Clinic  I discussed the limitations of evaluation and management by telemedicine. The  patient expressed understanding and agreed to proceed.   Hearing/Vision screen Hearing Screening - Comments:: Patient  is able to hear conversational tones without difficulty.  No issues reported.   Vision Screening - Comments:: Last exam 2023, Dr. Georgette Dover, wears glasses  Dietary issues and exercise activities discussed: Current Exercise Habits: Home exercise routine, Type of exercise: walking, Intensity: Mild Healthy diet    Goals Addressed             This Visit's Progress    Follow up with Primary Care Provider       As needed.  Maintain healthy lifestyle; stay active, stay hydrated, healthy diet.     Patient Stated   On track    Would like to maintain current routine       Depression Screen    12/24/2021   11:27 AM 04/25/2021    9:35 AM 12/20/2020   11:25 AM 12/19/2019   11:17 AM 11/28/2019   10:01 AM 11/18/2018    9:56 AM 08/20/2017   10:57 AM  PHQ 2/9 Scores  PHQ - 2 Score 0 0 0 0 0 0 0  PHQ- 9 Score  0  0  0 0    Fall Risk    12/24/2021   11:29 AM 04/25/2021    9:21 AM 12/20/2020   11:22 AM 12/19/2019   11:17 AM 11/28/2019   10:01 AM  Fall Risk   Falls in the past year? 0 0 1 0 0  Number falls in past yr: 0 0 0 0 0  Injury with Fall? 0 0 0 0 0  Risk for fall due to :  No Fall Risks Other (Comment) Medication side effect   Risk for fall due to: Comment   tripped outside    Follow up Falls evaluation completed;Falls prevention discussed Falls evaluation completed Falls prevention discussed Falls evaluation completed;Falls prevention discussed Falls evaluation completed    FALL RISK PREVENTION PERTAINING TO THE HOME: Home free of loose throw rugs in walkways, pet beds, electrical cords, etc? Yes  Adequate lighting in your home to reduce risk of falls? Yes   ASSISTIVE DEVICES UTILIZED TO PREVENT FALLS: Life alert? No  Cell phone on person at all times? Yes Use of a cane, walker or w/c? No  Grab bars in the bathroom? No  Shower chair or bench in shower? No  Elevated toilet seat or a handicapped toilet? No   TIMED UP AND GO: Was the test performed? No .    Cognitive Function:    12/19/2019   11:21 AM 11/18/2018    9:58 AM 08/20/2017   10:58 AM 08/19/2016   10:28 AM 05/28/2015   10:12 AM  MMSE - Mini Mental State Exam  Not completed: Refused      Orientation to time  5 5 5 5   Orientation to Place  5 5 5 5   Registration  3 3 3 3   Attention/ Calculation  5 0 0 0  Recall  3 3 3 3   Language- name 2 objects   0 0 0  Language- repeat  1 1 1 1   Language- follow 3 step command   3 3 3   Language- read & follow direction   0 0 0  Write a sentence   0 0 0  Copy design   0 0 0  Total score   20 20 20         12/24/2021   11:40 AM  6CIT Screen  What Year? 0 points  What month? 0 points  What time? 0 points  Count back from 20 0 points  Repeat phrase 0 points    Immunizations Immunization History  Administered Date(s) Administered   Fluad Quad(high Dose 65+) 10/11/2018, 10/21/2019, 11/08/2020, 10/28/2021   Influenza Split 11/05/2010   Influenza,inj,Quad PF,6+ Mos 10/13/2012, 10/12/2013, 10/12/2014, 10/10/2015, 10/22/2016, 09/23/2017   Pneumococcal Conjugate-13 05/28/2015   Pneumococcal Polysaccharide-23 09/05/2008   Td 12/05/1997   TDAP status: Due, Education has been provided regarding the importance of this vaccine. Advised may receive this vaccine at local pharmacy or Health Dept. Aware to provide a copy of the vaccination record if obtained from local pharmacy or Health Dept. Verbalized acceptance and understanding.Deferred.   Screening Tests Health Maintenance  Topic Date Due   DTaP/Tdap/Td (2 - Tdap) 12/06/2007   Zoster Vaccines- Shingrix (1 of 2) 07/28/2022 (Originally 11/02/1992)   Hepatitis C Screening  12/25/2022 (Originally 11/02/1958)   Medicare Annual Wellness (AWV)  12/25/2022   Pneumonia Vaccine 82+ Years old  Completed   INFLUENZA VACCINE  Completed   DEXA SCAN  Completed   HPV VACCINES  Aged Out   COVID-19 Vaccine  Discontinued   Health Maintenance Health Maintenance Due  Topic Date Due   DTaP/Tdap/Td (2  - Tdap) 12/06/2007   Lung Cancer Screening: (Low Dose CT Chest recommended if Age 30-80 years, 30 pack-year currently smoking OR have quit w/in 15years.) does not qualify.   Hepatitis C Screening: does not qualify.  Vision Screening: Recommended annual ophthalmology exams for early detection of glaucoma and other disorders of the eye.  Dental Screening: Recommended annual dental exams for proper oral hygiene  Community Resource Referral / Chronic Care Management: CRR required this visit?  No   CCM required this visit?  No      Plan:     I have personally reviewed and noted the following in the patient's chart:   Medical and social history Use of alcohol, tobacco or illicit drugs  Current medications and supplements including opioid prescriptions. Patient is not currently taking opioid prescriptions. Functional ability and status Nutritional status Physical activity Advanced directives List of other physicians Hospitalizations, surgeries, and ER visits in previous 12 months Vitals Screenings to include cognitive, depression, and falls Referrals and appointments  In addition, I have reviewed and discussed with patient certain preventive protocols, quality metrics, and best practice recommendations. A written personalized care plan for preventive services as well as general preventive health recommendations were provided to patient.     Leta Jungling, LPN   [id number]

## 2022-01-14 ENCOUNTER — Ambulatory Visit (INDEPENDENT_AMBULATORY_CARE_PROVIDER_SITE_OTHER): Payer: Medicare HMO

## 2022-01-14 DIAGNOSIS — I441 Atrioventricular block, second degree: Secondary | ICD-10-CM

## 2022-01-14 LAB — CUP PACEART REMOTE DEVICE CHECK
Battery Impedance: 2127 Ohm
Battery Remaining Longevity: 31 mo
Battery Voltage: 2.74 V
Brady Statistic AP VP Percent: 43 %
Brady Statistic AP VS Percent: 0 %
Brady Statistic AS VP Percent: 57 %
Brady Statistic AS VS Percent: 0 %
Date Time Interrogation Session: 20240110083724
Implantable Lead Connection Status: 753985
Implantable Lead Connection Status: 753985
Implantable Lead Implant Date: 20131106
Implantable Lead Implant Date: 20131106
Implantable Lead Location: 753859
Implantable Lead Location: 753860
Implantable Lead Model: 5076
Implantable Lead Model: 5092
Implantable Pulse Generator Implant Date: 20131106
Lead Channel Impedance Value: 471 Ohm
Lead Channel Impedance Value: 628 Ohm
Lead Channel Pacing Threshold Amplitude: 0.375 V
Lead Channel Pacing Threshold Amplitude: 1 V
Lead Channel Pacing Threshold Pulse Width: 0.4 ms
Lead Channel Pacing Threshold Pulse Width: 0.4 ms
Lead Channel Setting Pacing Amplitude: 2 V
Lead Channel Setting Pacing Amplitude: 2.5 V
Lead Channel Setting Pacing Pulse Width: 0.4 ms
Lead Channel Setting Sensing Sensitivity: 2 mV
Zone Setting Status: 755011
Zone Setting Status: 755011

## 2022-02-05 NOTE — Progress Notes (Signed)
Remote pacemaker transmission.   

## 2022-02-22 ENCOUNTER — Other Ambulatory Visit: Payer: Self-pay | Admitting: Family Medicine

## 2022-02-22 DIAGNOSIS — E039 Hypothyroidism, unspecified: Secondary | ICD-10-CM

## 2022-02-22 DIAGNOSIS — E559 Vitamin D deficiency, unspecified: Secondary | ICD-10-CM

## 2022-02-22 DIAGNOSIS — E785 Hyperlipidemia, unspecified: Secondary | ICD-10-CM

## 2022-02-22 DIAGNOSIS — R7309 Other abnormal glucose: Secondary | ICD-10-CM

## 2022-02-22 DIAGNOSIS — I10 Essential (primary) hypertension: Secondary | ICD-10-CM

## 2022-02-23 ENCOUNTER — Other Ambulatory Visit (INDEPENDENT_AMBULATORY_CARE_PROVIDER_SITE_OTHER): Payer: Medicare HMO

## 2022-02-23 DIAGNOSIS — E039 Hypothyroidism, unspecified: Secondary | ICD-10-CM

## 2022-02-23 DIAGNOSIS — I10 Essential (primary) hypertension: Secondary | ICD-10-CM

## 2022-02-23 DIAGNOSIS — R7309 Other abnormal glucose: Secondary | ICD-10-CM | POA: Diagnosis not present

## 2022-02-23 DIAGNOSIS — E559 Vitamin D deficiency, unspecified: Secondary | ICD-10-CM

## 2022-02-23 DIAGNOSIS — E785 Hyperlipidemia, unspecified: Secondary | ICD-10-CM | POA: Diagnosis not present

## 2022-02-23 LAB — COMPREHENSIVE METABOLIC PANEL
ALT: 12 U/L (ref 0–35)
AST: 15 U/L (ref 0–37)
Albumin: 4.4 g/dL (ref 3.5–5.2)
Alkaline Phosphatase: 51 U/L (ref 39–117)
BUN: 19 mg/dL (ref 6–23)
CO2: 30 mEq/L (ref 19–32)
Calcium: 10.5 mg/dL (ref 8.4–10.5)
Chloride: 102 mEq/L (ref 96–112)
Creatinine, Ser: 0.97 mg/dL (ref 0.40–1.20)
GFR: 55.66 mL/min — ABNORMAL LOW (ref >60.00)
Glucose, Bld: 103 mg/dL — ABNORMAL HIGH (ref 70–99)
Potassium: 4.4 mEq/L (ref 3.5–5.1)
Sodium: 140 mEq/L (ref 135–145)
Total Bilirubin: 0.7 mg/dL (ref 0.2–1.2)
Total Protein: 6.8 g/dL (ref 6.0–8.3)

## 2022-02-23 LAB — LIPID PANEL
Cholesterol: 198 mg/dL (ref 0–200)
HDL: 48.6 mg/dL (ref >39.00)
LDL Cholesterol: 114 mg/dL — ABNORMAL HIGH (ref 0–99)
NonHDL: 149.28
Total CHOL/HDL Ratio: 4
Triglycerides: 177 mg/dL — ABNORMAL HIGH (ref 0.0–149.0)
VLDL: 35.4 mg/dL (ref 0.0–40.0)

## 2022-02-23 LAB — VITAMIN D 25 HYDROXY (VIT D DEFICIENCY, FRACTURES): VITD: 26.9 ng/mL — ABNORMAL LOW (ref 30.00–100.00)

## 2022-02-23 LAB — HEMOGLOBIN A1C: Hgb A1c MFr Bld: 5.3 % (ref 4.6–6.5)

## 2022-02-23 LAB — TSH: TSH: 0.42 u[IU]/mL (ref 0.35–5.50)

## 2022-03-02 ENCOUNTER — Encounter: Payer: Self-pay | Admitting: Family Medicine

## 2022-03-02 ENCOUNTER — Ambulatory Visit (INDEPENDENT_AMBULATORY_CARE_PROVIDER_SITE_OTHER): Payer: Medicare HMO | Admitting: Family Medicine

## 2022-03-02 VITALS — BP 128/62 | HR 84 | Temp 98.1°F | Ht 59.0 in | Wt 146.0 lb

## 2022-03-02 DIAGNOSIS — E039 Hypothyroidism, unspecified: Secondary | ICD-10-CM

## 2022-03-02 DIAGNOSIS — E559 Vitamin D deficiency, unspecified: Secondary | ICD-10-CM

## 2022-03-02 DIAGNOSIS — Z7189 Other specified counseling: Secondary | ICD-10-CM

## 2022-03-02 DIAGNOSIS — Z0000 Encounter for general adult medical examination without abnormal findings: Secondary | ICD-10-CM

## 2022-03-02 DIAGNOSIS — M79643 Pain in unspecified hand: Secondary | ICD-10-CM | POA: Diagnosis not present

## 2022-03-02 DIAGNOSIS — I10 Essential (primary) hypertension: Secondary | ICD-10-CM

## 2022-03-02 MED ORDER — LEVOTHYROXINE SODIUM 100 MCG PO TABS: 100.0000 ug | ORAL_TABLET | Freq: Every day | ORAL | 3 refills | Status: DC

## 2022-03-02 MED ORDER — HYDROCHLOROTHIAZIDE 25 MG PO TABS: 25.0000 mg | ORAL_TABLET | Freq: Every day | ORAL | 3 refills | Status: DC

## 2022-03-02 MED ORDER — CLOPIDOGREL BISULFATE 75 MG PO TABS: 75.0000 mg | ORAL_TABLET | Freq: Every day | ORAL | 3 refills | Status: DC

## 2022-03-02 MED ORDER — AMLODIPINE BESYLATE 5 MG PO TABS: 5.0000 mg | ORAL_TABLET | Freq: Two times a day (BID) | ORAL | 3 refills | Status: DC

## 2022-03-02 MED ORDER — VITAMIN D (CHOLECALCIFEROL) 25 MCG (1000 UT) PO TABS: 2000.0000 [IU] | ORAL_TABLET | Freq: Every day | ORAL | Status: AC

## 2022-03-02 MED ORDER — OLMESARTAN MEDOXOMIL 40 MG PO TABS: 40.0000 mg | ORAL_TABLET | Freq: Every day | ORAL | 3 refills | Status: DC

## 2022-03-02 MED ORDER — NITROGLYCERIN 0.4 MG SL SUBL: SUBLINGUAL_TABLET | SUBLINGUAL | 5 refills | Status: DC

## 2022-03-02 NOTE — Progress Notes (Unsigned)
Hypertension:    Using medication without problems or lightheadedness:  yes Chest pain with exertion:no Edema:no Short of breath: at baseline with echo pending.  No acute changes.   Statin intolerant.    She is going to have f/u carotid/neck imaging per outside clinic.  Echo pending per cards.    D/w pt about balance exercises at baseline.  She'll consider PT eval, d/w pt. she will let me know if she wants a referral.  Hypothyroidism. Tsh wnl.  Compliant.  On replacement.  No dysphagia.    Vit D def.  D/w pt about replacement and recheck labs.   Flu shot 2023  covid vaccine d/w PNA up to date Tetanus 1999. Shingles d/w pt.  RSV d/w pt.    Colonoscopy declined in 2024 Mammogram- dw pt, encouraged.  She declined at this point.  She still does manual checks.   DXA d/w pt.  She'll consider.  Defer for now per patient request.   Advance directive- Son Paralee Semmler, brother Leo Grosser, and granddaughter Rebecah Matura all equally designated if patient were incapacitated.  She explicitly wanted to be a DNR.    Today was her deceased husband's birthday, condolences offered.  D/w pt.    She is dealing with hand arthritis at baseline.  Prednisone prev helped temporarily.  D/w pt about taking tylenol mult times a day.  See med list.    Meds, vitals, and allergies reviewed.   PMH and SH reviewed  ROS: Per HPI unless specifically indicated in ROS section   GEN: nad, alert and oriented HEENT: mucous membranes moist NECK: supple w/o LA CV: rrr. SEM noted.  PULM: ctab, no inc wob ABD: soft, +bs EXT: no edema SKIN: no acute rash  30 minutes were devoted to patient care in this encounter (this includes time spent reviewing the patient's file/history, interviewing and examining the patient, counseling/reviewing plan with patient).

## 2022-03-02 NOTE — Patient Instructions (Addendum)
Increase to 2000 units of vit D and recheck lab in about 3 months.  Take care.  Glad to see you. Update me as needed.

## 2022-03-04 NOTE — Assessment & Plan Note (Signed)
She is going to have f/u carotid/neck imaging per outside clinic.  Echo pending per cards.   Statin intolerant.  Continue amlodipine Plavix hydrochlorothiazide olmesartan.  Previous labs discussed with patient.

## 2022-03-04 NOTE — Assessment & Plan Note (Signed)
Tsh wnl.  Compliant.  On replacement.  No dysphagia.   Continue levothyroxine.

## 2022-03-04 NOTE — Assessment & Plan Note (Signed)
Flu shot 2023  covid vaccine d/w PNA up to date Tetanus 1999. Shingles d/w pt.  RSV d/w pt.    Colonoscopy declined in 2024 Mammogram- dw pt, encouraged.  She declined at this point.  She still does manual checks.   DXA d/w pt.  She'll consider.  Defer for now per patient request.   Advance directive- Son Everly Burdick, brother Leo Grosser, and granddaughter Gladyse Duree all equally designated if patient were incapacitated.  She explicitly wanted to be a DNR.

## 2022-03-04 NOTE — Assessment & Plan Note (Signed)
Vit D def.  D/w pt about replacement and recheck labs.

## 2022-03-04 NOTE — Assessment & Plan Note (Signed)
Advance directive- Son Anggie Brandin, brother Leo Grosser, and granddaughter Laasia Carmosino all equally designated if patient were incapacitated.  She explicitly wanted to be a DNR.

## 2022-03-04 NOTE — Assessment & Plan Note (Signed)
She is dealing with hand arthritis at baseline.  Prednisone prev helped temporarily.  D/w pt about taking tylenol mult times a day.  See med list.

## 2022-03-17 ENCOUNTER — Other Ambulatory Visit (HOSPITAL_COMMUNITY): Payer: Self-pay | Admitting: Interventional Radiology

## 2022-03-17 DIAGNOSIS — I771 Stricture of artery: Secondary | ICD-10-CM

## 2022-03-25 ENCOUNTER — Telehealth: Payer: Self-pay | Admitting: Family Medicine

## 2022-03-25 NOTE — Telephone Encounter (Signed)
Patient dropped off document Handicap Placard, to be filled out by provider. Patient requested to send it via Call Patient to pick up within ASAP. Document is located in providers tray at front office.Please advise at number patient left inside envelope, if not in there advise at [phone].

## 2022-03-25 NOTE — Telephone Encounter (Signed)
Form placed in Dr. Josefine Class inbox to sign upon return to office

## 2022-03-26 NOTE — Telephone Encounter (Signed)
Form signed and patient notified. Form has been placed up front in folder for pickup

## 2022-04-01 ENCOUNTER — Ambulatory Visit (HOSPITAL_COMMUNITY)
Admission: RE | Admit: 2022-04-01 | Discharge: 2022-04-01 | Disposition: A | Discharge status: Home / Self Care | Payer: Medicare HMO | Source: Ambulatory Visit | Attending: Interventional Radiology | Admitting: Interventional Radiology

## 2022-04-01 DIAGNOSIS — I6623 Occlusion and stenosis of bilateral posterior cerebral arteries: Secondary | ICD-10-CM | POA: Diagnosis not present

## 2022-04-01 DIAGNOSIS — I771 Stricture of artery: Secondary | ICD-10-CM | POA: Diagnosis not present

## 2022-04-01 DIAGNOSIS — I6523 Occlusion and stenosis of bilateral carotid arteries: Secondary | ICD-10-CM | POA: Diagnosis not present

## 2022-04-01 MED ORDER — IOHEXOL 350 MG/ML SOLN
75.0000 mL | Freq: Once | INTRAVENOUS | Status: AC | PRN
  Administered 2022-04-01: 65 mL via INTRAVENOUS

## 2022-04-01 MED ORDER — SODIUM CHLORIDE (PF) 0.9 % IJ SOLN
INTRAMUSCULAR | Status: AC
  Filled 2022-04-01: qty 50

## 2022-04-01 MED ORDER — IOHEXOL 300 MG/ML  SOLN: 75.0000 mL | Freq: Once | INTRAMUSCULAR | Status: DC | PRN

## 2022-04-13 ENCOUNTER — Telehealth (HOSPITAL_COMMUNITY): Payer: Self-pay

## 2022-04-13 NOTE — Telephone Encounter (Signed)
Pt agreed to f/u in 2 years with a cta head/neck. AB

## 2022-04-15 ENCOUNTER — Ambulatory Visit (INDEPENDENT_AMBULATORY_CARE_PROVIDER_SITE_OTHER): Payer: Medicare HMO

## 2022-04-15 DIAGNOSIS — I441 Atrioventricular block, second degree: Secondary | ICD-10-CM

## 2022-04-15 LAB — CUP PACEART REMOTE DEVICE CHECK
Battery Impedance: 2142 Ohm
Battery Remaining Longevity: 31 mo
Battery Voltage: 2.74 V
Brady Statistic AP VP Percent: 43 %
Brady Statistic AP VS Percent: 0 %
Brady Statistic AS VP Percent: 57 %
Brady Statistic AS VS Percent: 0 %
Date Time Interrogation Session: 20240410082121
Implantable Lead Connection Status: 753985
Implantable Lead Connection Status: 753985
Implantable Lead Implant Date: 20131106
Implantable Lead Implant Date: 20131106
Implantable Lead Location: 753859
Implantable Lead Location: 753860
Implantable Lead Model: 5076
Implantable Lead Model: 5092
Implantable Pulse Generator Implant Date: 20131106
Lead Channel Impedance Value: 469 Ohm
Lead Channel Impedance Value: 624 Ohm
Lead Channel Pacing Threshold Amplitude: 0.375 V
Lead Channel Pacing Threshold Amplitude: 1.125 V
Lead Channel Pacing Threshold Pulse Width: 0.4 ms
Lead Channel Pacing Threshold Pulse Width: 0.4 ms
Lead Channel Setting Pacing Amplitude: 2 V
Lead Channel Setting Pacing Amplitude: 2.5 V
Lead Channel Setting Pacing Pulse Width: 0.4 ms
Lead Channel Setting Sensing Sensitivity: 2 mV
Zone Setting Status: 755011
Zone Setting Status: 755011

## 2022-05-11 ENCOUNTER — Ambulatory Visit (HOSPITAL_COMMUNITY): Payer: Medicare HMO | Attending: Cardiovascular Disease

## 2022-05-11 DIAGNOSIS — I2510 Atherosclerotic heart disease of native coronary artery without angina pectoris: Secondary | ICD-10-CM | POA: Insufficient documentation

## 2022-05-11 DIAGNOSIS — I340 Nonrheumatic mitral (valve) insufficiency: Secondary | ICD-10-CM | POA: Diagnosis not present

## 2022-05-11 DIAGNOSIS — I350 Nonrheumatic aortic (valve) stenosis: Secondary | ICD-10-CM | POA: Diagnosis not present

## 2022-05-11 LAB — ECHOCARDIOGRAM COMPLETE
AR max vel: 1.09 cm2
AV Area VTI: 1.05 cm2
AV Area mean vel: 1.06 cm2
AV Mean grad: 22 mmHg
AV Peak grad: 36.5 mmHg
Ao pk vel: 3.02 m/s
Area-P 1/2: 4.93 cm2
S' Lateral: 2.2 cm

## 2022-05-22 NOTE — Progress Notes (Signed)
Remote pacemaker transmission.   

## 2022-06-02 ENCOUNTER — Other Ambulatory Visit (INDEPENDENT_AMBULATORY_CARE_PROVIDER_SITE_OTHER): Payer: Medicare HMO

## 2022-06-02 DIAGNOSIS — E559 Vitamin D deficiency, unspecified: Secondary | ICD-10-CM | POA: Diagnosis not present

## 2022-06-02 LAB — VITAMIN D 25 HYDROXY (VIT D DEFICIENCY, FRACTURES): VITD: 51 ng/mL (ref 30.00–100.00)

## 2022-06-04 DIAGNOSIS — H04221 Epiphora due to insufficient drainage, right lacrimal gland: Secondary | ICD-10-CM | POA: Diagnosis not present

## 2022-06-04 DIAGNOSIS — H43813 Vitreous degeneration, bilateral: Secondary | ICD-10-CM | POA: Diagnosis not present

## 2022-06-04 DIAGNOSIS — Z961 Presence of intraocular lens: Secondary | ICD-10-CM | POA: Diagnosis not present

## 2022-06-24 NOTE — Progress Notes (Unsigned)
Cardiology Office Note Date:  06/24/2022  Patient ID:  Kelsey Dunn, Kelsey Dunn [birth year], MRN [id number] PCP:  Joaquim Nam, MD  Cardiologist:  Dr. Sanjuana Kava Electrophysiologist: Dr. Johney Frame   Chief Complaint: *** annual visit  History of Present Illness: Kelsey Dunn is a 82 y.o. female with history of CAD (PCI to RCA 2011), hypothyroidism, HTN, HLD, CVA, known vascular disease basilar artery angioplasty in 2010, and known occluded L ICA,  RBBB >< LBBB > mobitz II heart block w/PPM, chronic diastolic CHF  She saw Dr. Johney Frame 06/20/21, doing well, no reported symptoms, device dependent, no changes were made.  She saw Dr. Clifton James 09/10/21, again doing well, no reported symptoms, intolerant of statins, volume stable, no changes were made.  *** symptoms *** labs, lipids...    Device information:  MDT dual chamber PPM, implanted 11/11/11 Dependent  Past Medical History:  Diagnosis Date   Arthritis    "fingers" (03-09-13)   Basilar artery stenosis    90%   Bladder spasms    Blood transfusion 1960's   w/childbirth   CAD (coronary artery disease)    a. 06/2009 cath: severe stenosis RCA with placement drug eluting stent;  b. 08/2011 cath: normal left main, luminal LAD irregularities, luminal LCx irregularities, luminal RCA irregularities and no ISR to mid-RCA DES, LVEF 60-65%; c. 03-09-2013 nonischemic CL, EF 79%.   Family history of anesthesia complication    "first cousin had fever and almost died" (03-09-13)   Fibromyalgia    HLD (hyperlipidemia) 01/1997   Hypertension    Hypothyroidism 1990   IC (interstitial cystitis)    Labile hypertension 08/1991   Left-sided carotid artery obstruction    Malignant hyperthermia    "told to warn everybody before I had surgery that my 1st cousin had malignant hyperthermia; I've had symptoms of it too" (Mar 09, 2013)   Obesity    Pacemaker 11/11/2011   dual chamber pacemaker   Pneumonia    "twice" (09-Mar-2013)   Second degree Mobitz II AV  block    TIA (transient ischemic attack)    multiple/notes 08/02/2015    Past Surgical History:  Procedure Laterality Date   basilar artery angioplasty  09/04/08   sm right brachial hematoma   Bilateral Vert & Subclavian Angiogram  07/19/08   85-90% stenosis mid basilar art; occluded left vert artery; occluded left comm carotid artery w/ collat flow   BLADDER SUSPENSION  1974   CARDIAC CATHETERIZATION  1993   normal   carotid arteriogram     bilat   Carotid US  11/93   Occl L comm carotid   Carotid US  4/98   L comm occl R-ok   Carotid US  01/30/03   L occl  R less 40%   CATARACT EXTRACTION W/ INTRAOCULAR LENS IMPLANT Bilateral 2007-2008   Cath single vess dz  06/13/09   70% stenosis RCA EF 65-70%   CHOLECYSTECTOMY  1985   CORONARY ANGIOPLASTY WITH STENT PLACEMENT  06/19/09   DEXA  10/15/04   Troch -0.4 o/w pos   EMG  1/01   LE, RUE normal   EYE SURGERY     INSERT / REPLACE / REMOVE PACEMAKER  11/11/2011   MDT Adapta L implanted by Dr Johney Frame. [employer]   LEFT HEART CATHETERIZATION WITH CORONARY ANGIOGRAM N/A 08/10/2011   Procedure: LEFT HEART CATHETERIZATION WITH CORONARY ANGIOGRAM;  Surgeon: Kathleene Hazel, MD;  Location[facility] C[facility] LAB;  Service: Cardiovascular;  Laterality: N/A;   LEFT HEART CATHETERIZATION WITH CORONARY  ANGIOGRAM N/A 07/28/2013   Procedure: LEFT HEART CATHETERIZATION WITH CORONARY ANGIOGRAM;  Surgeon: Kathleene Hazel, MD;  Location: [facility] CATH LAB;  Service: Cardiovascular;  Laterality: N/A;   PERMANENT PACEMAKER INSERTION N/A 11/11/2011   Procedure: PERMANENT PACEMAKER INSERTION;  Surgeon: Hillis Range, MD;  Location: [facility] CATH LAB;  Service: Cardiovascular;  Laterality: N/A;   RENAL ANGIOPLASTY  1993   normal   US RENAL/AORTA  12/93   Normal   VAGINAL HYSTERECTOMY  1974   dysmennorhea   Visual evoked response  11/93; 1/01   normal    Current Outpatient Medications  Medication Sig Dispense Refill   acetaminophen (TYLENOL) 500 MG tablet Take 1,000  mg by mouth every 8 (eight) hours as needed for moderate pain.     amLODipine (NORVASC) 5 MG tablet Take 1 tablet (5 mg total) by mouth 2 (two) times daily. 180 tablet 3   clopidogrel (PLAVIX) 75 MG tablet Take 1 tablet (75 mg total) by mouth daily. 90 tablet 3   hydrochlorothiazide (HYDRODIURIL) 25 MG tablet Take 1 tablet (25 mg total) by mouth daily. 90 tablet 3   levothyroxine (SYNTHROID) 100 MCG tablet Take 1 tablet (100 mcg total) by mouth daily. 90 tablet 3   nitroGLYCERIN (NITROSTAT) 0.4 MG SL tablet PLACE 1 TABLET UNDER THE TONGUE EVERY 5 (FIVE) MINUTES AS NEEDED FOR CHEST PAIN (MAX 3 TABLETS) 25 tablet 5   olmesartan (BENICAR) 40 MG tablet Take 1 tablet (40 mg total) by mouth daily. 90 tablet 3   Vitamin D, Cholecalciferol, 25 MCG (1000 UT) TABS Take 2,000 Units by mouth daily.     No current facility-administered medications for this visit.    Allergies:   Anesthetics, amide; Codeine; Nitrofurantoin; Orphenadrine citrate; Pravastatin; Simvastatin; Ibuprofen; and Pentosan polysulfate sodium   Social History:  The patient  reports that she has never smoked. She has never used smokeless tobacco. She reports that she does not drink alcohol and does not use drugs.   Family History:  The patient's family history includes Breast cancer in her maternal aunt; Diabetes in her brother; Heart attack in her mother; Lung cancer in her father; Lymphoma in her mother; Stroke in her mother.  ROS:  Please see the history of present illness.    All other systems are reviewed and otherwise negative.   PHYSICAL EXAM:  VS:  There were no vitals taken for this visit. BMI: There is no height or weight on file to calculate BMI. Well nourished, well developed, in no acute distress  HEENT: normocephalic, atraumatic  Neck: no JVD, carotid bruits or masses Cardiac: *** RRR; no significant murmurs, no rubs, or gallops Lungs: *** CTA b/l, no wheezing, rhonchi or rales  Abd: soft, nontender, central  obesity MS: no deformity, age appropriate atrophy Ext: *** no edema  Skin: warm and dry, no rash Neuro:  No gross deficits appreciated Psych: euthymic mood, full affect  *** PPM site is stable, no tethering or discomfort   EKG:  Done today and reviewed by me ***  PPM interrogation today and reviewed by myself:  ***   05/11/22: TTE 1. Left ventricular ejection fraction, by estimation, is 60 to 65%. The  left ventricle has normal function. The left ventricle has no regional  wall motion abnormalities. There is severe asymmetric left ventricular  hypertrophy of the basal segment. Left  ventricular diastolic parameters are consistent with Grade I diastolic  dysfunction (impaired relaxation). The average left ventricular global  longitudinal strain is -20.3 %. The global  longitudinal strain is normal.   2. Right ventricular systolic function is normal. The right ventricular  size is normal. There is normal pulmonary artery systolic pressure.   3. A small pericardial effusion is present. The pericardial effusion is  anterior to the right ventricle. There is no evidence of cardiac  tamponade.   4. The mitral valve is normal in structure. Mild to moderate mitral valve  regurgitation. No evidence of mitral stenosis.   5. Tricuspid valve regurgitation is mild to moderate.   6. The aortic valve is calcified. Aortic valve regurgitation is not  visualized. Moderate aortic valve stenosis. Aortic valve area, by VTI  measures 1.05 cm. Aortic valve mean gradient measures 22.0 mmHg. Aortic  valve Vmax measures 3.02 m/s.   7. The inferior vena cava is normal in size with greater than 50%  respiratory variability, suggesting right atrial pressure of 3 mmHg.    05/12/21: TTE 1. Left ventricular ejection fraction, by estimation, is 55 to 60%. The  left ventricle has normal function. The left ventricle has no regional  wall motion abnormalities. Mild focal basal septal left ventricular   hypertrophy. Left ventricular diastolic  parameters are consistent with Grade I diastolic dysfunction (impaired  relaxation). The average left ventricular global longitudinal strain is  -21.9 %. The global longitudinal strain is normal.   2. Right ventricular systolic function is normal. The right ventricular  size is normal. There is normal pulmonary artery systolic pressure. The  estimated right ventricular systolic pressure is 29.4 mmHg.   3. Left atrial size was mildly dilated.   4. The mitral valve is normal in structure. Mild mitral valve  regurgitation. No evidence of mitral stenosis.   5. The aortic valve is tricuspid. There is moderate calcification of the  aortic valve. Aortic valve regurgitation is not visualized. Moderate  aortic valve stenosis. Aortic valve area, by VTI measures 1.04 cm. Aortic  valve mean gradient measures 21.0  mmHg.   6. The inferior vena cava is normal in size with greater than 50%  respiratory variability, suggesting right atrial pressure of 3 mmHg.   08/03/15: TTE Study Conclusions - Left ventricle: The cavity size was normal. Wall thickness was   normal. Systolic function was normal. The estimated ejection   fraction was in the range of 60% to 65%. Wall motion was normal;   there were no regional wall motion abnormalities. Features are   consistent with a pseudonormal left ventricular filling pattern,   with concomitant abnormal relaxation and increased filling   pressure (grade 2 diastolic dysfunction). - Ventricular septum: Septal motion showed paradox. These changes   are consistent with right ventricular pacing. - Aortic valve: There was mild stenosis. Valve area (VTI): 2.05   cm^2. Valve area (Vmax): 1.87 cm^2. Valve area (Vmean): 1.78   cm^2. - Mitral valve: There was mild to moderate regurgitation directed   centrally. - Left atrium: The atrium was mildly dilated. 39mm  Recent Labs: 02/23/2022: ALT 12; BUN 19; Creatinine, Ser 0.97;  Potassium 4.4; Sodium 140; TSH 0.42  02/23/2022: Cholesterol 198; HDL 48.60; LDL Cholesterol 114; Total CHOL/HDL Ratio 4; Triglycerides 177.0; VLDL 35.4   CrCl cannot be calculated (Patient's most recent lab result is older than the maximum 21 days allowed.).   Wt Readings from Last 3 Encounters:  03/02/22 146 lb (66.2 kg)  12/24/21 141 lb 3.2 oz (64 kg)  09/10/21 148 lb 3.2 oz (67.2 kg)     Other studies reviewed: Additional studies/records reviewed today include: summarized  above  ASSESSMENT AND PLAN:  1. PPM     *** normal device function     *** no changes made  2. VHD     *** Mild AS, mild-mod MR   3. CAD     *** no symptoms     C/w Dr. Sanjuana Kava  4. CVA, recurrent TIA's     Known vascular (Corotid and basilar artery)     *** Follows with neurology  5. HTN    ***  6. Chronic diastolic CHF ***   Disposition:***  Current medicines are reviewed at length with the patient today.  The patient did not have any concerns regarding medicines.  Judith Blonder, PA-C 06/24/2022 7:57 AM     [facility] [address] [place] [address] [phone] (office)  [phone] (fax)

## 2022-06-25 ENCOUNTER — Ambulatory Visit: Payer: Medicare HMO | Attending: Physician Assistant | Admitting: Physician Assistant

## 2022-06-25 ENCOUNTER — Encounter: Payer: Self-pay | Admitting: Physician Assistant

## 2022-06-25 VITALS — BP 118/68 | HR 62 | Ht 59.0 in | Wt 144.0 lb

## 2022-06-25 DIAGNOSIS — Z950 Presence of cardiac pacemaker: Secondary | ICD-10-CM | POA: Diagnosis not present

## 2022-06-25 DIAGNOSIS — I441 Atrioventricular block, second degree: Secondary | ICD-10-CM

## 2022-06-25 DIAGNOSIS — I2510 Atherosclerotic heart disease of native coronary artery without angina pectoris: Secondary | ICD-10-CM | POA: Diagnosis not present

## 2022-06-25 DIAGNOSIS — I5032 Chronic diastolic (congestive) heart failure: Secondary | ICD-10-CM

## 2022-06-25 LAB — CUP PACEART INCLINIC DEVICE CHECK
Battery Impedance: 2166 Ohm
Battery Remaining Longevity: 30 mo
Battery Voltage: 2.75 V
Brady Statistic AP VP Percent: 42 %
Brady Statistic AP VS Percent: 0 %
Brady Statistic AS VP Percent: 58 %
Brady Statistic AS VS Percent: 0 %
Date Time Interrogation Session: 20240620130445
Implantable Lead Connection Status: 753985
Implantable Lead Connection Status: 753985
Implantable Lead Implant Date: 20131106
Implantable Lead Implant Date: 20131106
Implantable Lead Location: 753859
Implantable Lead Location: 753860
Implantable Lead Model: 5076
Implantable Lead Model: 5092
Implantable Pulse Generator Implant Date: 20131106
Lead Channel Impedance Value: 422 Ohm
Lead Channel Impedance Value: 567 Ohm
Lead Channel Pacing Threshold Amplitude: 0.5 V
Lead Channel Pacing Threshold Amplitude: 0.5 V
Lead Channel Pacing Threshold Amplitude: 0.75 V
Lead Channel Pacing Threshold Amplitude: 1.25 V
Lead Channel Pacing Threshold Pulse Width: 0.4 ms
Lead Channel Pacing Threshold Pulse Width: 0.4 ms
Lead Channel Pacing Threshold Pulse Width: 0.4 ms
Lead Channel Pacing Threshold Pulse Width: 0.4 ms
Lead Channel Sensing Intrinsic Amplitude: 4 mV
Lead Channel Setting Pacing Amplitude: 2 V
Lead Channel Setting Pacing Amplitude: 2.5 V
Lead Channel Setting Pacing Pulse Width: 0.4 ms
Lead Channel Setting Sensing Sensitivity: 2 mV
Zone Setting Status: 755011
Zone Setting Status: 755011

## 2022-06-25 NOTE — Patient Instructions (Signed)
Medication Instructions:   Your physician recommends that you continue on your current medications as directed. Please refer to the Current Medication list given to you today.  *If you need a refill on your cardiac medications before your next appointment, please call your pharmacy*   Lab Work: NONE ORDERED  TODAY   If you have labs (blood work) drawn today and your tests are completely normal, you will receive your results only by: MyChart Message (if you have MyChart) OR A paper copy in the mail If you have any lab test that is abnormal or we need to change your treatment, we will call you to review the results.   Testing/Procedures: NONE ORDERED  TODAY     Follow-Up: At [facility], you and your health needs are our priority.  As part of our continuing mission to provide you with exceptional heart care, we have created designated Provider Care Teams.  These Care Teams include your primary Cardiologist (physician) and Advanced Practice Providers (APPs -  Physician Assistants and Nurse Practitioners) who all work together to provide you with the care you need, when you need it.  We recommend signing up for the patient portal called "MyChart".  Sign up information is provided on this After Visit Summary.  MyChart is used to connect with patients for Virtual Visits (Telemedicine).  Patients are able to view lab/test results, encounter notes, upcoming appointments, etc.  Non-urgent messages can be sent to your provider as well.   To learn more about what you can do with MyChart, go to [email or web address].    Your next appointment:   1 year(s)  Provider:   Augustus Mealor, MD    Other Instructions  

## 2022-07-15 ENCOUNTER — Ambulatory Visit (INDEPENDENT_AMBULATORY_CARE_PROVIDER_SITE_OTHER): Payer: Medicare HMO

## 2022-07-15 DIAGNOSIS — I441 Atrioventricular block, second degree: Secondary | ICD-10-CM | POA: Diagnosis not present

## 2022-07-15 LAB — CUP PACEART REMOTE DEVICE CHECK
Battery Impedance: 2101 Ohm
Battery Remaining Longevity: 31 mo
Battery Voltage: 2.74 V
Brady Statistic AP VP Percent: 38 %
Brady Statistic AP VS Percent: 0 %
Brady Statistic AS VP Percent: 62 %
Brady Statistic AS VS Percent: 0 %
Date Time Interrogation Session: 20240710080156
Implantable Lead Connection Status: 753985
Implantable Lead Connection Status: 753985
Implantable Lead Implant Date: 20131106
Implantable Lead Implant Date: 20131106
Implantable Lead Location: 753859
Implantable Lead Location: 753860
Implantable Lead Model: 5076
Implantable Lead Model: 5092
Implantable Pulse Generator Implant Date: 20131106
Lead Channel Impedance Value: 440 Ohm
Lead Channel Impedance Value: 560 Ohm
Lead Channel Pacing Threshold Amplitude: 0.375 V
Lead Channel Pacing Threshold Amplitude: 1.25 V
Lead Channel Pacing Threshold Pulse Width: 0.4 ms
Lead Channel Pacing Threshold Pulse Width: 0.4 ms
Lead Channel Setting Pacing Amplitude: 2 V
Lead Channel Setting Pacing Amplitude: 2.5 V
Lead Channel Setting Pacing Pulse Width: 0.4 ms
Lead Channel Setting Sensing Sensitivity: 2 mV
Zone Setting Status: 755011
Zone Setting Status: 755011

## 2022-07-27 ENCOUNTER — Encounter: Payer: Self-pay | Admitting: Family Medicine

## 2022-07-27 ENCOUNTER — Ambulatory Visit (INDEPENDENT_AMBULATORY_CARE_PROVIDER_SITE_OTHER): Payer: Medicare HMO | Admitting: Family Medicine

## 2022-07-27 VITALS — BP 110/80 | HR 73 | Temp 97.6°F | Ht 59.0 in | Wt 144.0 lb

## 2022-07-27 DIAGNOSIS — I10 Essential (primary) hypertension: Secondary | ICD-10-CM

## 2022-07-27 MED ORDER — HYDROCHLOROTHIAZIDE 25 MG PO TABS: ORAL_TABLET | ORAL | Status: DC

## 2022-07-27 MED ORDER — CARBOXYMETHYLCELLULOSE SODIUM 1 % OP SOLN: 1.0000 [drp] | Freq: Three times a day (TID) | OPHTHALMIC | Status: AC

## 2022-07-27 NOTE — Progress Notes (Signed)
Dizziness.  No BLE edema.  Can get lightheaded.  Still on hydrochlorothiazide amlodipine and benicar.  H/o weight loss, was 150 lbs about 2 years ago.  144 lbs today.  Lightheadedness going on for the last year or so.    She has pacer at baseline.  She had noted occ L hand tremor with writing but not a resting tremor.  She is right handed but using her L hand more because of arthritis.  She noted dec in tremor with hand exercises.    Meds, vitals, and allergies reviewed.   ROS: Per HPI unless specifically indicated in ROS section   GEN: nad, alert and oriented HEENT: ncat NECK: supple w/o LA CV: rrr.  SEM noted.   PULM: ctab, no inc wob ABD: soft, +bs EXT: no edema SKIN: well perfused.   No resting tremor.

## 2022-07-27 NOTE — Assessment & Plan Note (Signed)
Likely over treated.  Stop hydrochlorothiazide for now.  If not better next week, then stop amlodipine.  Update [place] about status, about BP, and swelling Recheck in 2 weeks at office visit.  Had pacer eval per cards.   No CP.  Okay for patient f/u.

## 2022-07-27 NOTE — Patient Instructions (Addendum)
Stop the hydrochlorothiazide for now.  If not better next week, then stop amlodipine.  Update [place] about how you feel, about your BP, and if you have any swelling Take care.  Glad to see you. Recheck in 2 weeks at office visit.

## 2022-08-03 ENCOUNTER — Telehealth: Payer: Self-pay | Admitting: Family Medicine

## 2022-08-03 NOTE — Telephone Encounter (Signed)
Pt called to let Dr. Para March know that since the stop of hydrochlorothiazide (HYDRODIURIL) 25 MG tablet [[id number]] , she's not feeling dizzy & is doing a lot better. Call back # [phone]

## 2022-08-04 NOTE — Addendum Note (Signed)
Addended by: Joaquim Nam on: 08/04/2022 09:31 AM   Modules accepted: Orders

## 2022-08-04 NOTE — Telephone Encounter (Signed)
Pt called back returning [name]'s call. Told pt [name]'s response. Pt had no questions/concerns. Call back # [phone]

## 2022-08-04 NOTE — Telephone Encounter (Signed)
LMTCB

## 2022-08-04 NOTE — Telephone Encounter (Signed)
Duly noted.  Glad to hear.  Med list updated.  Would continue as is.  Thanks.

## 2022-08-05 NOTE — Progress Notes (Signed)
Remote pacemaker transmission.   

## 2022-08-10 ENCOUNTER — Ambulatory Visit (INDEPENDENT_AMBULATORY_CARE_PROVIDER_SITE_OTHER): Payer: Medicare HMO | Admitting: Family Medicine

## 2022-08-10 ENCOUNTER — Encounter: Payer: Self-pay | Admitting: Family Medicine

## 2022-08-10 VITALS — BP 140/92 | HR 73 | Temp 97.7°F | Ht 59.0 in | Wt 140.0 lb

## 2022-08-10 DIAGNOSIS — I10 Essential (primary) hypertension: Secondary | ICD-10-CM | POA: Diagnosis not present

## 2022-08-10 MED ORDER — AMLODIPINE BESYLATE 5 MG PO TABS: 5.0000 mg | ORAL_TABLET | Freq: Every evening | ORAL | Status: DC

## 2022-08-10 NOTE — Patient Instructions (Addendum)
I would keep taking olmesartan 40mg  daily.   I would taking 5mg  amlodipine at night, around 5 or 6 PM.  If your BP is persistently above 140/90 after 1 week then you could add back 2.5mg  in the early AM.    Take care.  Glad to see you.

## 2022-08-10 NOTE — Assessment & Plan Note (Signed)
Likely still overtreated.  Discussed options.  I would keep taking olmesartan 40mg  daily.   I would take 5mg  amlodipine at night, around 5 or 6 PM.  If your BP is persistently above 140/90 after 1 week then you could add back 2.5mg  in the early AM.   Recheck in about 2 weeks.  Update me as needed.   Likely that her total daily dose is more important than then timing of her meds.

## 2022-08-10 NOTE — Progress Notes (Signed)
She skipped amlodipine this AM.  She is off hydrochlorothiazide.  No BLE edema.  She is some better in terms of lightheadedness, less often and less severe.    Had been taking amlodipine 5mg  BID and olmesartan 40mg  daily.    Meds, vitals, and allergies reviewed.   ROS: Per HPI unless specifically indicated in ROS section   GEN: nad, alert and oriented HEENT: ncat NECK: supple w/o LA CV: rrr.  SEM at baseline.  PULM: ctab, no inc wob ABD: soft, +bs EXT: no edema SKIN: no acute rash  Discussed mult options re: meds.   Amlodipine 10mg  at night.   Amlodipine 5mg  at night.   Amlodipine 2.5mg  twice a day.   Amlodipine 2.5mg  AM and 5mg  PM

## 2022-08-12 ENCOUNTER — Telehealth: Payer: Self-pay | Admitting: Family Medicine

## 2022-08-12 NOTE — Telephone Encounter (Signed)
FYI: This call has been transferred to Access Nurse. Once the result note has been entered staff can address the message at that time.  Patient called in with the following symptoms:  Red Word:elevated blood pressure   Please advise at Mobile [phone] (mobile)  Message is routed to Provider Pool and [facility] Triage    Pt called in stating recently Dr. Alphonsus Sias took her off some of her BP meds. Pt states the top # of her BP has been ranging from 150 to 167 & the bottom # has been in the 90s, now the bottom # is 101. Pt states she has been experiencing headaches because of the high BP. Pt declined triage, pt states she just want Dr. Alphonsus Sias to contact her with advise. Pt states due to incoming weather for tomorrow, she doesn't want to come in for an appt.

## 2022-08-12 NOTE — Telephone Encounter (Signed)
See below and please check on patient tomorrow re: BP meds and dosing.  Thanks.

## 2022-08-12 NOTE — Telephone Encounter (Signed)
I think she meant Dr Para March. Will forward to his pool.

## 2022-08-12 NOTE — Telephone Encounter (Signed)
Spoke with pt  was seen in office 08/10/22, Dr. Para March made changes to amlodipine.  Pt reports that today her BP was 167/92 @ 2:30p and 177/90 @ 4:35p. Pt complains of a dull headache off and on.  She denies any LE swelling and reports good urine output.  Per Dr. Sharen Hones pt to take amlodipine 10mg  tonight.  Will await further direction from Dr. Para March when he is back in the office.  Asked pt to take her BP in the morning and afternoon.

## 2022-08-12 NOTE — Telephone Encounter (Signed)
Please verify that she is taking olmesartan 40mg  daily and 5mg  amlodipine at night.   If so, would add back 2.5mg  amlodipine in the early AM.    If already taking 5+2.5mg  amlodipine, then would increase to 5mg  amlodipine BID.    Thanks.

## 2022-08-13 MED ORDER — AMLODIPINE BESYLATE 5 MG PO TABS: 10.0000 mg | ORAL_TABLET | Freq: Every evening | ORAL | Status: DC

## 2022-08-13 NOTE — Telephone Encounter (Signed)
I would keep taking 10mg  amlodipine for now and update [place] about her BP/status next week by phone, sooner if needed.  Thanks.

## 2022-08-13 NOTE — Addendum Note (Signed)
Addended by: Joaquim Nam on: 08/13/2022 01:46 PM   Modules accepted: Orders

## 2022-08-13 NOTE — Telephone Encounter (Signed)
Spoke with patient and she took 10 mg Amlodipine at 5:30 pm as instructed to do yesterday. BP this morning was 174/79 and pulse 63. Patient has no HA's and is feeling good today. Wants to know if she needs to continue taking the 10 mg or go back to 5?

## 2022-08-13 NOTE — Telephone Encounter (Signed)
Notified patient about medication update and advised to call back next week with update on BP.

## 2022-08-21 ENCOUNTER — Ambulatory Visit: Payer: Medicare HMO | Admitting: Family Medicine

## 2022-08-21 ENCOUNTER — Encounter: Payer: Self-pay | Admitting: Family Medicine

## 2022-08-21 VITALS — BP 140/60 | HR 73 | Temp 97.0°F | Ht 59.0 in | Wt 141.0 lb

## 2022-08-21 DIAGNOSIS — I10 Essential (primary) hypertension: Secondary | ICD-10-CM

## 2022-08-21 MED ORDER — HYDROCHLOROTHIAZIDE 12.5 MG PO TABS: 12.5000 mg | ORAL_TABLET | Freq: Every day | ORAL | 3 refills | Status: DC

## 2022-08-21 NOTE — Progress Notes (Unsigned)
HTN f/u. She drinks liberal amount of fluids at baseline, with urinary frequency today.  Had been on olmesartan 40mg  with 10mg  amlodipine for the last week or so and BP is still up on home checks. 150-160s/80-90s recently.  She is limiting salt. No CP.  No BLE edema.    She has has diffuse hand pain, attributed to arthritis.   Meds, vitals, and allergies reviewed.   ROS: Per HPI unless specifically indicated in ROS section

## 2022-08-21 NOTE — Patient Instructions (Addendum)
Try adding back 12.5mg  hydrochlorothiazide. I sent that rx today.  If lightheaded/low BP, then cut back to 6.25mg  hydrochlorothiazide.  Go to the lab on the way out.   If you have mychart we'll likely use that to update you.    Recheck in 1 week.

## 2022-08-22 LAB — CBC WITH DIFFERENTIAL/PLATELET
Absolute Monocytes: 540 {cells}/uL (ref 200–950)
Basophils Absolute: 53 {cells}/uL (ref 0–200)
Basophils Relative: 0.7 %
Eosinophils Absolute: 143 {cells}/uL (ref 15–500)
Eosinophils Relative: 1.9 %
HCT: 45.7 % — ABNORMAL HIGH (ref 35.0–45.0)
Hemoglobin: 15.2 g/dL (ref 11.7–15.5)
Lymphs Abs: 2408 {cells}/uL (ref 850–3900)
MCH: 31 pg (ref 27.0–33.0)
MCHC: 33.3 g/dL (ref 32.0–36.0)
MCV: 93.1 fL (ref 80.0–100.0)
MPV: 11.4 fL (ref 7.5–12.5)
Monocytes Relative: 7.2 %
Neutro Abs: 4358 {cells}/uL (ref 1500–7800)
Neutrophils Relative %: 58.1 %
Platelets: 279 10*3/uL (ref 140–400)
RBC: 4.91 10*6/uL (ref 3.80–5.10)
RDW: 12.7 % (ref 11.0–15.0)
Total Lymphocyte: 32.1 %
WBC: 7.5 10*3/uL (ref 3.8–10.8)

## 2022-08-22 LAB — COMPREHENSIVE METABOLIC PANEL
AG Ratio: 1.9 (calc) (ref 1.0–2.5)
ALT: 11 U/L (ref 6–29)
AST: 14 U/L (ref 10–35)
Albumin: 4.6 g/dL (ref 3.6–5.1)
Alkaline phosphatase (APISO): 54 U/L (ref 37–153)
BUN: 17 mg/dL (ref 7–25)
CO2: 24 mmol/L (ref 20–32)
Calcium: 10.7 mg/dL — ABNORMAL HIGH (ref 8.6–10.4)
Chloride: 103 mmol/L (ref 98–110)
Creat: 0.84 mg/dL (ref 0.60–1.00)
Globulin: 2.4 g/dL (ref 1.9–3.7)
Glucose, Bld: 88 mg/dL (ref 65–99)
Potassium: 4.3 mmol/L (ref 3.5–5.3)
Sodium: 141 mmol/L (ref 135–146)
Total Bilirubin: 0.4 mg/dL (ref 0.2–1.2)
Total Protein: 7 g/dL (ref 6.1–8.1)

## 2022-08-22 LAB — TSH: TSH: 0.5 m[IU]/L (ref 0.40–4.50)

## 2022-08-23 NOTE — Assessment & Plan Note (Signed)
Try adding back 12.5mg  hydrochlorothiazide. I sent that rx today.  If lightheaded/low BP, then cut back to 6.25mg  hydrochlorothiazide.  See notes on labs. Recheck 1 week.

## 2022-08-28 ENCOUNTER — Ambulatory Visit (INDEPENDENT_AMBULATORY_CARE_PROVIDER_SITE_OTHER): Payer: Medicare HMO | Admitting: Family Medicine

## 2022-08-28 ENCOUNTER — Encounter: Payer: Self-pay | Admitting: Family Medicine

## 2022-08-28 DIAGNOSIS — I10 Essential (primary) hypertension: Secondary | ICD-10-CM

## 2022-08-28 DIAGNOSIS — R202 Paresthesia of skin: Secondary | ICD-10-CM | POA: Diagnosis not present

## 2022-08-28 NOTE — Progress Notes (Unsigned)
Hypertension:   She feels better in the meantime.   Using medication without problems or lightheadedness: yes Chest pain with exertion:no Edema:no Short of breath:no Went to grocery store yesterday and felt well.    PTH pending, for elevated calcium.  Normal vit D.  Rationale for follow-up calcium and PTH level discussed with patient.  R hand numbness that is longstanding.  Dec local sensation with monofilament on R hand but normal on L. Prev bracing didn't help. Grip still intact.   Can still make a fist B. Tinel neg R wrist.    Current Outpatient Medications on File Prior to Visit  Medication Sig Dispense Refill   acetaminophen (TYLENOL) 500 MG tablet Take 1,000 mg by mouth every 8 (eight) hours as needed for moderate pain.     amLODipine (NORVASC) 5 MG tablet Take 2 tablets (10 mg total) by mouth every evening.     carboxymethylcellulose 1 % ophthalmic solution Apply 1 drop to eye 3 (three) times daily.     clopidogrel (PLAVIX) 75 MG tablet Take 1 tablet (75 mg total) by mouth daily. 90 tablet 3   hydrochlorothiazide (HYDRODIURIL) 12.5 MG tablet Take 1 tablet (12.5 mg total) by mouth daily. 90 tablet 3   levothyroxine (SYNTHROID) 100 MCG tablet Take 1 tablet (100 mcg total) by mouth daily. 90 tablet 3   nitroGLYCERIN (NITROSTAT) 0.4 MG SL tablet PLACE 1 TABLET UNDER THE TONGUE EVERY 5 (FIVE) MINUTES AS NEEDED FOR CHEST PAIN (MAX 3 TABLETS) 25 tablet 5   olmesartan (BENICAR) 40 MG tablet Take 1 tablet (40 mg total) by mouth daily. 90 tablet 3   Vitamin D, Cholecalciferol, 25 MCG (1000 UT) TABS Take 2,000 Units by mouth daily.     No current facility-administered medications on file prior to visit.   She slept well last night.  She was up the night before due to worrying about her granddaughter who is a [employer] agent.  Discussed.  Meds, vitals, and allergies reviewed.   PMH and SH reviewed  ROS: Per HPI unless specifically indicated in ROS section   GEN: nad, alert and oriented HEENT:  ncat NECK: supple w/o LA CV: rrr. SEM noted.  PULM: ctab, no inc wob EXT: no edema SKIN: no acute rash Dec local sensation with monofilament on R hand but normal on L. Prev bracing didn't help. Grip still intact.   Can still make a fist B. Tinel neg R wrist.

## 2022-08-28 NOTE — Patient Instructions (Addendum)
If you are lightheaded, then skip the hydrochlorothiazide for a day if needed.    Go to the lab on the way out.   If you have mychart we'll likely use that to update you.     Take care.  Glad to see you.  Let me know if you want to go to the hand clinic in [place].

## 2022-08-29 LAB — PTH, INTACT AND CALCIUM
Calcium: 10.6 mg/dL — ABNORMAL HIGH (ref 8.6–10.4)
PTH: 71 pg/mL (ref 16–77)

## 2022-08-30 DIAGNOSIS — R202 Paresthesia of skin: Secondary | ICD-10-CM | POA: Insufficient documentation

## 2022-08-30 NOTE — Assessment & Plan Note (Signed)
  PTH pending, for elevated calcium.  Normal vit D.  Rationale for follow-up calcium and PTH level discussed with patient.  See notes on labs.

## 2022-08-30 NOTE — Assessment & Plan Note (Signed)
Not an acute issue.  Grip is still intact.  Discussed hand clinic evaluation.  She can let me know if she wants to follow through with that.  Update me as needed.

## 2022-08-30 NOTE — Assessment & Plan Note (Signed)
Continue amlodipine and hydrochlorothiazide and olmesartan as is. If are lightheaded, then skip the hydrochlorothiazide for a day if needed.

## 2022-08-31 ENCOUNTER — Telehealth: Payer: Self-pay | Admitting: Family Medicine

## 2022-08-31 DIAGNOSIS — E213 Hyperparathyroidism, unspecified: Secondary | ICD-10-CM

## 2022-08-31 NOTE — Addendum Note (Signed)
Addended by: Joaquim Nam on: 08/31/2022 05:15 PM   Modules accepted: Orders

## 2022-08-31 NOTE — Telephone Encounter (Signed)
Patient called in stating that Dr Sid Falcon office is needing a referral to see her,since she has never been seen thee before.

## 2022-08-31 NOTE — Telephone Encounter (Signed)
Referral done.  Thanks.

## 2022-09-01 NOTE — Telephone Encounter (Signed)
Spoke with PT, advised that referral was placed and may take up to 2 weeks to process.

## 2022-10-04 NOTE — Progress Notes (Unsigned)
No chief complaint on file.  History of Present Illness: 82 yo female with h/o CAD, HTN, hyperlipidemia, hypothyroidism, AV block s/p PPM, CVA and basilar artery stenosis, mitral regurgitation and aortic stenosis here today for follow up. Left heart cath on 06/13/09 and she was found to have a severe stenosis in the RCA. There was minor disease in the LAD and Circumflex. A drug eluting stent was placed in the RCA. Last cardiac cath in 2015 with stable CAD. She was noted to have second degree AV block while admitted in 2013. Her coreg was stopped. She was discharged and wore an event monitor. This did not document arrhythmias or advanced AV block. There were no episodes of AV block documented. GXT was performed in our office 09/01/11. She was observed during the study to have a LBBB with exertion. She developed exertional symptoms of fatigue, SOB, and CP which corresponded to her LBBB. She was seen by Dr. Johney Frame and continued to have episodes of palpitations and dizziness with moderate activity. A permanent pacemaker was placed on 11/11/11 by Dr. Johney Frame. She has been undergoing cerebral angiograms for basilar artery stenosis and has had angiplasty of the basilar artery in August 2010. She is known to have an occluded left common carotid artery. She had a CVA in December 2012. Echo May 2024 with LVEF=60-65%. Severe asymmetric LVH. Mild to moderate MR. Moderate aortic stenosis with mean gradient of 22 mmHg.   She is here today for follow up. The patient denies any chest pain, dyspnea, palpitations, lower extremity edema, orthopnea, PND, dizziness, near syncope or syncope.    Primary Care Physician: Joaquim Nam, MD  Past Medical History:  Diagnosis Date   Arthritis    "fingers" (March 29, 2013)   Basilar artery stenosis    90%   Bladder spasms    Blood transfusion 1960's   w/childbirth   CAD (coronary artery disease)    a. 06/2009 cath: severe stenosis RCA with placement drug eluting stent;  b.  08/2011 cath: normal left main, luminal LAD irregularities, luminal LCx irregularities, luminal RCA irregularities and no ISR to mid-RCA DES, LVEF 60-65%; c. 02/2013 nonischemic CL, EF 79%.   Family history of anesthesia complication    "first cousin had fever and almost died" (29-Mar-2013)   Fibromyalgia    HLD (hyperlipidemia) 01/1997   Hypertension    Hypothyroidism 1990   IC (interstitial cystitis)    Labile hypertension 08/1991   Left-sided carotid artery obstruction    Malignant hyperthermia    "told to warn everybody before I had surgery that my 1st cousin had malignant hyperthermia; I've had symptoms of it too" (03-29-13)   Obesity    Pacemaker 11/11/2011   dual chamber pacemaker   Pneumonia    "twice" (2013/03/29)   Second degree Mobitz II AV block    TIA (transient ischemic attack)    multiple/notes 08/02/2015    Past Surgical History:  Procedure Laterality Date   basilar artery angioplasty  09/04/08   sm right brachial hematoma   Bilateral Vert & Subclavian Angiogram  07/19/08   85-90% stenosis mid basilar art; occluded left vert artery; occluded left comm carotid artery w/ collat flow   BLADDER SUSPENSION  1974   CARDIAC CATHETERIZATION  1993   normal   carotid arteriogram     bilat   Carotid US  11/93   Occl L comm carotid   Carotid US  4/98   L comm occl R-ok   Carotid US  01/30/03  L occl  R less 40%   CATARACT EXTRACTION W/ INTRAOCULAR LENS IMPLANT Bilateral 2007-2008   Cath single vess dz  06/13/09   70% stenosis RCA EF 65-70%   CHOLECYSTECTOMY  1985   CORONARY ANGIOPLASTY WITH STENT PLACEMENT  06/19/09   DEXA  10/15/04   Troch -0.4 o/w pos   EMG  1/01   LE, RUE normal   EYE SURGERY     INSERT / REPLACE / REMOVE PACEMAKER  11/11/2011   MDT Adapta L implanted by Dr Johney Frame. [employer]   LEFT HEART CATHETERIZATION WITH CORONARY ANGIOGRAM N/A 08/10/2011   Procedure: LEFT HEART CATHETERIZATION WITH CORONARY ANGIOGRAM;  Surgeon: Kathleene Hazel, MD;  Location: [facility]  CATH LAB;  Service: Cardiovascular;  Laterality: N/A;   LEFT HEART CATHETERIZATION WITH CORONARY ANGIOGRAM N/A 07/28/2013   Procedure: LEFT HEART CATHETERIZATION WITH CORONARY ANGIOGRAM;  Surgeon: Kathleene Hazel, MD;  Location: [facility] CATH LAB;  Service: Cardiovascular;  Laterality: N/A;   PERMANENT PACEMAKER INSERTION N/A 11/11/2011   Procedure: PERMANENT PACEMAKER INSERTION;  Surgeon: Hillis Range, MD;  Location: [facility] CATH LAB;  Service: Cardiovascular;  Laterality: N/A;   RENAL ANGIOPLASTY  1993   normal   US RENAL/AORTA  12/93   Normal   VAGINAL HYSTERECTOMY  1974   dysmennorhea   Visual evoked response  11/93; 1/01   normal    Current Outpatient Medications  Medication Sig Dispense Refill   acetaminophen (TYLENOL) 500 MG tablet Take 1,000 mg by mouth every 8 (eight) hours as needed for moderate pain.     amLODipine (NORVASC) 5 MG tablet Take 2 tablets (10 mg total) by mouth every evening.     carboxymethylcellulose 1 % ophthalmic solution Apply 1 drop to eye 3 (three) times daily.     clopidogrel (PLAVIX) 75 MG tablet Take 1 tablet (75 mg total) by mouth daily. 90 tablet 3   hydrochlorothiazide (HYDRODIURIL) 12.5 MG tablet Take 1 tablet (12.5 mg total) by mouth daily. 90 tablet 3   levothyroxine (SYNTHROID) 100 MCG tablet Take 1 tablet (100 mcg total) by mouth daily. 90 tablet 3   nitroGLYCERIN (NITROSTAT) 0.4 MG SL tablet PLACE 1 TABLET UNDER THE TONGUE EVERY 5 (FIVE) MINUTES AS NEEDED FOR CHEST PAIN (MAX 3 TABLETS) 25 tablet 5   olmesartan (BENICAR) 40 MG tablet Take 1 tablet (40 mg total) by mouth daily. 90 tablet 3   Vitamin D, Cholecalciferol, 25 MCG (1000 UT) TABS Take 2,000 Units by mouth daily.     No current facility-administered medications for this visit.    Allergies  Allergen Reactions   Anesthetics, Amide Other (See Comments)    REACTION:FEVER, SYNCOPE   Codeine Nausea And Vomiting and Other (See Comments)    Hallucinations   Nitrofurantoin Nausea Only and Other  (See Comments)    Hallucinations   Orphenadrine Citrate Nausea And Vomiting and Other (See Comments)     Hallucination   Pravastatin Other (See Comments)    Myalgias   Simvastatin Other (See Comments)    myalgias   Ibuprofen     Would avoid if possible   Pentosan Polysulfate Sodium Rash and Other (See Comments)     rash/ severe headache    Social History   Socioeconomic History   Marital status: Widowed    Spouse name: Not on file   Number of children: 1   Years of education: Not on file   Highest education level: Not on file  Occupational History   Occupation: Bookkeeper-part time  Occupation: retired    Associate Professor: RETIRED  Tobacco Use   Smoking status: Never   Smokeless tobacco: Never  Vaping Use   Vaping status: Never Used  Substance and Sexual Activity   Alcohol use: No    Alcohol/week: 0.0 standard drinks of alcohol   Drug use: No   Sexual activity: Not Currently    Birth control/protection: Post-menopausal  Other Topics Concern   Not on file  Social History Narrative   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   Widowed 2021.  Was married 1962; 1 child   Drinks coffee and coke daily    Social Determinants of Health   Financial Resource Strain: Low Risk  (12/24/2021)   Overall Financial Resource Strain (CARDIA)    Difficulty of Paying Living Expenses: Not hard at all  Food Insecurity: No Food Insecurity (12/24/2021)   Hunger Vital Sign    Worried About Running Out of Food in the Last Year: Never true    Ran Out of Food in the Last Year: Never true  Transportation Needs: No Transportation Needs (12/24/2021)   PRAPARE - Administrator, Civil Service (Medical): No    Lack of Transportation (Non-Medical): No  Physical Activity: Insufficiently Active (12/24/2021)   Exercise Vital Sign    Days of Exercise per Week: 7 days    Minutes of Exercise per Session: 20 min  Stress: No Stress Concern Present (12/24/2021)    [employer] of Occupational Health - Occupational Stress Questionnaire    Feeling of Stress : Not at all  Social Connections: Moderately Isolated (12/24/2021)   Social Connection and Isolation Panel [NHANES]    Frequency of Communication with Friends and Family: More than three times a week    Frequency of Social Gatherings with Friends and Family: Three times a week    Attends Religious Services: More than 4 times per year    Active Member of Clubs or Organizations: No    Attends Banker Meetings: Never    Marital Status: Widowed  Intimate Partner Violence: Not At Risk (12/24/2021)   Humiliation, Afraid, Rape, and Kick questionnaire    Fear of Current or Ex-Partner: No    Emotionally Abused: No    Physically Abused: No    Sexually Abused: No    Family History  Problem Relation Age of Onset   Lung cancer Father    Heart attack Mother    Lymphoma Mother        s/p chemo   Stroke Mother        x 2   Diabetes Brother    Breast cancer Maternal Aunt    Colon cancer Neg Hx     Review of Systems:  As stated in the HPI and otherwise negative.   There were no vitals taken for this visit.  Physical Examination: General: Well developed, well nourished, NAD  HEENT: OP clear, mucus membranes moist  SKIN: warm, dry. No rashes. Neuro: No focal deficits  Musculoskeletal: Muscle strength 5/5 all ext  Psychiatric: Mood and affect normal  Neck: No JVD, no carotid bruits, no thyromegaly, no lymphadenopathy.  Lungs:Clear bilaterally, no wheezes, rhonci, crackles Cardiovascular: Regular rate and rhythm. *** Systolic murmur.  Abdomen:Soft. Bowel sounds present. Non-tender.  Extremities: No lower extremity edema. Pulses are 2 + in the bilateral DP/PT.   EKG:  EKG is *** ordered today. The ekg ordered today demonstrates   Recent Labs: 08/21/2022: ALT 11; BUN 17; Creat 0.84; Hemoglobin 15.2; Platelets  279; Potassium 4.3; Sodium 141; TSH 0.50   Lipid Panel    Component  Value Date/Time   CHOL 198 02/23/2022 0746   TRIG 177.0 (H) 02/23/2022 0746   HDL 48.60 02/23/2022 0746   CHOLHDL 4 02/23/2022 0746   VLDL 35.4 02/23/2022 0746   LDLCALC 114 (H) 02/23/2022 0746   LDLDIRECT 145.0 10/01/2014 1336     Wt Readings from Last 3 Encounters:  08/28/22 64.9 kg  08/21/22 64 kg  08/10/22 63.5 kg    Assessment and Plan:   1. CAD without angina: No chest pain suggestive of angina. Continue Plavix given prior CVA and history of coronary stent.  She does not tolerate statins.   2. High grade symptomatic AV block: s/p PPM 11/10/11 per Dr. Johney Frame. She is followed in the pacer clinic.   3. HTN: BP is well controlled. No changes  4. Chronic diastolic CHF: Weight is stable. No volume overload on exam.   5. Aortic valve stenosis: Moderate by echo May 2024.   6. Mitral valve regurgitation: Mild by echo in May 2024  Labs/ tests ordered today include:  No orders of the defined types were placed in this encounter.  Disposition:   F/U with me in 12  months  Signed, Verne Carrow, MD 10/04/2022 6:08 PM    [facility] [address] [place], [place], [place]  [address] Phone: [phone]; Fax: [phone]

## 2022-10-05 ENCOUNTER — Ambulatory Visit: Payer: Medicare HMO | Attending: Cardiovascular Disease | Admitting: Cardiovascular Disease

## 2022-10-05 ENCOUNTER — Encounter: Payer: Self-pay | Admitting: Cardiovascular Disease

## 2022-10-05 VITALS — BP 142/62 | HR 64 | Ht <= 58 in | Wt 144.0 lb

## 2022-10-05 DIAGNOSIS — I441 Atrioventricular block, second degree: Secondary | ICD-10-CM | POA: Diagnosis not present

## 2022-10-05 DIAGNOSIS — I2510 Atherosclerotic heart disease of native coronary artery without angina pectoris: Secondary | ICD-10-CM | POA: Diagnosis not present

## 2022-10-05 DIAGNOSIS — I350 Nonrheumatic aortic (valve) stenosis: Secondary | ICD-10-CM | POA: Diagnosis not present

## 2022-10-05 DIAGNOSIS — I5032 Chronic diastolic (congestive) heart failure: Secondary | ICD-10-CM | POA: Diagnosis not present

## 2022-10-05 DIAGNOSIS — I10 Essential (primary) hypertension: Secondary | ICD-10-CM | POA: Diagnosis not present

## 2022-10-05 DIAGNOSIS — I340 Nonrheumatic mitral (valve) insufficiency: Secondary | ICD-10-CM

## 2022-10-05 NOTE — Patient Instructions (Addendum)
Medication Instructions:  Stop hctz *If you need a refill on your cardiac medications before your next appointment, please call your pharmacy*   Lab Work: none   Testing/Procedures: (due May/June 2025) Your physician has requested that you have an echocardiogram. Echocardiography is a painless test that uses sound waves to create images of your heart. It provides your doctor with information about the size and shape of your heart and how well your heart's chambers and valves are working. This procedure takes approximately one hour. There are no restrictions for this procedure. Please do NOT wear cologne, perfume, aftershave, or lotions (deodorant is allowed). Please arrive 15 minutes prior to your appointment time.   Follow-Up: At [facility], you and your health needs are our priority.  As part of our continuing mission to provide you with exceptional heart care, we have created designated Provider Care Teams.  These Care Teams include your primary Cardiologist (physician) and Advanced Practice Providers (APPs -  Physician Assistants and Nurse Practitioners) who all work together to provide you with the care you need, when you need it.  We recommend signing up for the patient portal called "MyChart".  Sign up information is provided on this After Visit Summary.  MyChart is used to connect with patients for Virtual Visits (Telemedicine).  Patients are able to view lab/test results, encounter notes, upcoming appointments, etc.  Non-urgent messages can be sent to your provider as well.   To learn more about what you can do with MyChart, go to [email or web address].    Your next appointment:   12 month(s)  Provider:   Verne Carrow, MD

## 2022-10-14 ENCOUNTER — Ambulatory Visit (INDEPENDENT_AMBULATORY_CARE_PROVIDER_SITE_OTHER): Payer: Medicare HMO

## 2022-10-14 DIAGNOSIS — I441 Atrioventricular block, second degree: Secondary | ICD-10-CM

## 2022-10-14 LAB — CUP PACEART REMOTE DEVICE CHECK
Battery Impedance: 2241 Ohm
Battery Remaining Longevity: 29 mo
Battery Voltage: 2.74 V
Brady Statistic AP VP Percent: 40 %
Brady Statistic AP VS Percent: 0 %
Brady Statistic AS VP Percent: 60 %
Brady Statistic AS VS Percent: 0 %
Date Time Interrogation Session: 20241009080321
Implantable Lead Connection Status: 753985
Implantable Lead Connection Status: 753985
Implantable Lead Implant Date: 20131106
Implantable Lead Implant Date: 20131106
Implantable Lead Location: 753859
Implantable Lead Location: 753860
Implantable Lead Model: 5076
Implantable Lead Model: 5092
Implantable Pulse Generator Implant Date: 20131106
Lead Channel Impedance Value: 422 Ohm
Lead Channel Impedance Value: 556 Ohm
Lead Channel Pacing Threshold Amplitude: 0.375 V
Lead Channel Pacing Threshold Amplitude: 1.125 V
Lead Channel Pacing Threshold Pulse Width: 0.4 ms
Lead Channel Pacing Threshold Pulse Width: 0.4 ms
Lead Channel Setting Pacing Amplitude: 2 V
Lead Channel Setting Pacing Amplitude: 2.5 V
Lead Channel Setting Pacing Pulse Width: 0.4 ms
Lead Channel Setting Sensing Sensitivity: 2 mV
Zone Setting Status: 755011
Zone Setting Status: 755011

## 2022-10-20 ENCOUNTER — Other Ambulatory Visit: Payer: Self-pay | Admitting: Surgery

## 2022-10-20 DIAGNOSIS — E210 Primary hyperparathyroidism: Secondary | ICD-10-CM | POA: Diagnosis not present

## 2022-10-22 ENCOUNTER — Ambulatory Visit (INDEPENDENT_AMBULATORY_CARE_PROVIDER_SITE_OTHER): Payer: Medicare HMO

## 2022-10-22 DIAGNOSIS — Z23 Encounter for immunization: Secondary | ICD-10-CM | POA: Diagnosis not present

## 2022-10-26 DIAGNOSIS — E210 Primary hyperparathyroidism: Secondary | ICD-10-CM | POA: Diagnosis not present

## 2022-10-28 ENCOUNTER — Other Ambulatory Visit: Payer: Medicare HMO

## 2022-10-29 ENCOUNTER — Other Ambulatory Visit: Payer: Self-pay | Admitting: Surgery

## 2022-10-29 DIAGNOSIS — D351 Benign neoplasm of parathyroid gland: Secondary | ICD-10-CM

## 2022-10-29 DIAGNOSIS — E210 Primary hyperparathyroidism: Secondary | ICD-10-CM

## 2022-10-29 DIAGNOSIS — E041 Nontoxic single thyroid nodule: Secondary | ICD-10-CM

## 2022-10-30 ENCOUNTER — Ambulatory Visit
Admission: RE | Admit: 2022-10-30 | Discharge: 2022-10-30 | Disposition: A | Discharge status: Home / Self Care | Payer: Medicare HMO | Source: Ambulatory Visit | Attending: Surgery | Admitting: Surgery

## 2022-10-30 ENCOUNTER — Encounter
Admission: RE | Admit: 2022-10-30 | Discharge: 2022-10-30 | Disposition: A | Discharge status: Home / Self Care | Payer: Medicare HMO | Source: Ambulatory Visit | Attending: Surgery | Admitting: Surgery

## 2022-10-30 DIAGNOSIS — D351 Benign neoplasm of parathyroid gland: Secondary | ICD-10-CM | POA: Insufficient documentation

## 2022-10-30 DIAGNOSIS — E041 Nontoxic single thyroid nodule: Secondary | ICD-10-CM | POA: Insufficient documentation

## 2022-10-30 DIAGNOSIS — E210 Primary hyperparathyroidism: Secondary | ICD-10-CM | POA: Insufficient documentation

## 2022-10-30 MED ORDER — TECHNETIUM TC 99M SESTAMIBI GENERIC - CARDIOLITE
25.0000 | Freq: Once | INTRAVENOUS | Status: AC | PRN
  Administered 2022-10-30: 24.14 via INTRAVENOUS

## 2022-11-04 NOTE — Progress Notes (Signed)
Remote pacemaker transmission.   

## 2022-11-09 NOTE — Progress Notes (Signed)
Sestamibi is negative for parathyroid adenoma.  Tresa Endo - please order 4D-CT scan of neck with parathyroid protocol to evaluate for parathyroid adenoma.  Darnell Level, MD [facility] practice Office: [phone]

## 2022-11-12 ENCOUNTER — Other Ambulatory Visit: Payer: Self-pay | Admitting: Surgery

## 2022-11-12 DIAGNOSIS — E213 Hyperparathyroidism, unspecified: Secondary | ICD-10-CM

## 2022-11-16 ENCOUNTER — Ambulatory Visit (INDEPENDENT_AMBULATORY_CARE_PROVIDER_SITE_OTHER): Payer: Medicare HMO | Admitting: Family Medicine

## 2022-11-16 ENCOUNTER — Encounter: Payer: Self-pay | Admitting: Family Medicine

## 2022-11-16 VITALS — BP 132/70 | HR 59 | Temp 98.2°F | Ht <= 58 in | Wt 141.2 lb

## 2022-11-16 DIAGNOSIS — R350 Frequency of micturition: Secondary | ICD-10-CM

## 2022-11-16 LAB — POC URINALSYSI DIPSTICK (AUTOMATED)
Bilirubin, UA: NEGATIVE
Blood, UA: NEGATIVE
Glucose, UA: NEGATIVE
Ketones, UA: NEGATIVE
Leukocytes, UA: NEGATIVE
Nitrite, UA: NEGATIVE
Protein, UA: POSITIVE — AB
Spec Grav, UA: 1.025 (ref 1.010–1.025)
Urobilinogen, UA: 0.2 U/dL
pH, UA: 5 (ref 5.0–8.0)

## 2022-11-16 MED ORDER — CEPHALEXIN 500 MG PO CAPS: 500.0000 mg | ORAL_CAPSULE | Freq: Three times a day (TID) | ORAL | 0 refills | Status: DC

## 2022-11-16 NOTE — Progress Notes (Unsigned)
dysuria: she has h/o urinary frequency at baseline but recently had hot sensation with urination.   duration of symptoms: hot sensation noted for the last few days.   abdominal pain: no fevers:no back pain:at baseline.   U/a d/w pt at OV.  Ucx pending.   She had general surgery f/u re: PTH.  W/u is pending, d/w pt.    Daughter in law had longstanding MS and recently died, condolences offered.    Meds, vitals, and allergies reviewed.  Per HPI unless specifically indicated in ROS section   GEN: nad, alert and oriented HEENT: ncat NECK: supple CV: rrr.  SEM.   PULM: ctab, no inc wob ABD: soft, +bs, suprapubic area not tender EXT: no edema SKIN: well perfused.  BACK: no CVA pain

## 2022-11-16 NOTE — Patient Instructions (Signed)
Go to the lab on the way out.   Drink plenty of water and start the antibiotics today.  We'll contact you with your lab report.  Take care.

## 2022-11-17 LAB — URINE CULTURE
MICRO NUMBER:: 15712873
Result:: NO GROWTH
SPECIMEN QUALITY:: ADEQUATE

## 2022-11-18 DIAGNOSIS — R350 Frequency of micturition: Secondary | ICD-10-CM | POA: Insufficient documentation

## 2022-11-18 NOTE — Assessment & Plan Note (Signed)
She had general surgery f/u re: PTH.  W/u is pending, d/w pt.

## 2022-11-18 NOTE — Assessment & Plan Note (Signed)
U/a d/w pt at OV.  Ucx pending.  D/w pt about options. Start keflex ans see AVS.  Okay for outpatient f/u.   Presumed cystitis.

## 2022-11-19 ENCOUNTER — Ambulatory Visit
Admission: RE | Admit: 2022-11-19 | Discharge: 2022-11-19 | Disposition: A | Discharge status: Home / Self Care | Payer: Medicare HMO | Source: Ambulatory Visit | Attending: Surgery | Admitting: Surgery

## 2022-11-19 DIAGNOSIS — D369 Benign neoplasm, unspecified site: Secondary | ICD-10-CM | POA: Diagnosis not present

## 2022-11-19 DIAGNOSIS — M47812 Spondylosis without myelopathy or radiculopathy, cervical region: Secondary | ICD-10-CM | POA: Diagnosis not present

## 2022-11-19 DIAGNOSIS — E213 Hyperparathyroidism, unspecified: Secondary | ICD-10-CM | POA: Insufficient documentation

## 2022-11-19 DIAGNOSIS — I6523 Occlusion and stenosis of bilateral carotid arteries: Secondary | ICD-10-CM | POA: Diagnosis not present

## 2022-11-19 DIAGNOSIS — J322 Chronic ethmoidal sinusitis: Secondary | ICD-10-CM | POA: Diagnosis not present

## 2022-11-19 MED ORDER — IOHEXOL 350 MG/ML SOLN
75.0000 mL | Freq: Once | INTRAVENOUS | Status: AC | PRN
  Administered 2022-11-19: 75 mL via INTRAVENOUS

## 2022-12-08 ENCOUNTER — Telehealth: Payer: Self-pay | Admitting: Family Medicine

## 2022-12-08 NOTE — Telephone Encounter (Signed)
Should be okay to use.  Pepto can cause dark stools, FYI to patient. If persistent sx, needs eval.  Thanks.

## 2022-12-08 NOTE — Telephone Encounter (Signed)
Patient called in and stated that she has a pacemaker. She stated that every now and then she has diarrhea when she eats certain stuff. She was wanting to know if it would be ok for her to take Pepto Bismol or Imodium when she starts having these bouts. Informed her to call her Cardiologist also, but she stated that she has a hard time getting through to them. Please advise. Thank you!

## 2022-12-10 NOTE — Telephone Encounter (Signed)
Spoke with pt and she is aware of Dr. Lianne Bushy response. Nothing further was needed.

## 2022-12-11 ENCOUNTER — Ambulatory Visit (INDEPENDENT_AMBULATORY_CARE_PROVIDER_SITE_OTHER): Payer: Medicare HMO

## 2022-12-11 VITALS — Ht <= 58 in | Wt 141.0 lb

## 2022-12-11 DIAGNOSIS — Z0000 Encounter for general adult medical examination without abnormal findings: Secondary | ICD-10-CM | POA: Diagnosis not present

## 2022-12-11 NOTE — Patient Instructions (Signed)
Kelsey Dunn , Thank you for taking time to come for your Medicare Wellness Visit. I appreciate your ongoing commitment to your health goals. Please review the following plan we discussed and let me know if I can assist you in the future.   Referrals/Orders/Follow-Ups/Clinician Recommendations: none  This is a list of the screening recommended for you and due dates:  Health Maintenance  Topic Date Due   Zoster (Shingles) Vaccine (1 of 2) 03/11/2023*   DTaP/Tdap/Td vaccine (2 - Tdap) 12/11/2023*   Medicare Annual Wellness Visit  12/11/2023   Pneumonia Vaccine  Completed   Flu Shot  Completed   DEXA scan (bone density measurement)  Completed   HPV Vaccine  Aged Out   COVID-19 Vaccine  Discontinued  *Topic was postponed. The date shown is not the original due date.    Advanced directives: (Copy Requested) Please bring a copy of your health care power of attorney and living will to the office to be added to your chart at your convenience.  Next Medicare Annual Wellness Visit scheduled for next year: Yes 12/14/23 @ 8:10am telephone

## 2022-12-11 NOTE — Progress Notes (Signed)
Subjective:   Kelsey Dunn is a 82 y.o. female who presents for Medicare Annual (Subsequent) preventive examination.  Visit Complete: Virtual I connected with  [name] on 12/11/22 by a audio enabled telemedicine application and verified that I am speaking with the correct person using two identifiers.  Patient Location: Home  Provider Location: Office/Clinic  I discussed the limitations of evaluation and management by telemedicine. The patient expressed understanding and agreed to proceed.  Vital Signs: Because this visit was a virtual/telehealth visit, some criteria may be missing or patient reported. Any vitals not documented were not able to be obtained and vitals that have been documented are patient reported.  Patient Medicare AWV questionnaire was completed by the patient on (not done); I have confirmed that all information answered by patient is correct and no changes since this date. Cardiac Risk Factors include: advanced age (>60men, >90 women);obesity (BMI >30kg/m2);hypertension    Objective:    Today's Vitals   12/11/22 0821  Weight: 141 lb (64 kg)  Height: 4\' 9"  (1.448 m)   Body mass index is 30.51 kg/m.     12/11/2022    8:35 AM 12/24/2021   11:34 AM 12/20/2020   11:20 AM 12/19/2019   11:15 AM 11/18/2018    9:56 AM 08/20/2017   10:58 AM 08/19/2016   10:52 AM  Advanced Directives  Does Patient Have a Medical Advance Directive? Yes No Yes Yes No No No  Type of Advance Directive Living will;Healthcare Power of Attorney  Living will Healthcare Power of [place];Living will     Does patient want to make changes to medical advance directive?   Yes (MAU/Ambulatory/Procedural Areas - Information given)      Copy of Healthcare Power of Attorney in Chart? No - copy requested   No - copy requested     Would patient like information on creating a medical advance directive?  No - Patient declined Yes (MAU/Ambulatory/Procedural Areas - Information given)  No - Patient  declined No - Patient declined     Current Medications (verified) Outpatient Encounter Medications as of 12/11/2022  Medication Sig   acetaminophen (TYLENOL) 500 MG tablet Take 1,000 mg by mouth every 8 (eight) hours as needed for moderate pain.   amLODipine (NORVASC) 5 MG tablet Take 2 tablets (10 mg total) by mouth every evening.   carboxymethylcellulose 1 % ophthalmic solution Apply 1 drop to eye 3 (three) times daily.   clopidogrel (PLAVIX) 75 MG tablet Take 1 tablet (75 mg total) by mouth daily.   levothyroxine (SYNTHROID) 100 MCG tablet Take 1 tablet (100 mcg total) by mouth daily.   nitroGLYCERIN (NITROSTAT) 0.4 MG SL tablet PLACE 1 TABLET UNDER THE TONGUE EVERY 5 (FIVE) MINUTES AS NEEDED FOR CHEST PAIN (MAX 3 TABLETS)   olmesartan (BENICAR) 40 MG tablet Take 1 tablet (40 mg total) by mouth daily.   Vitamin D, Cholecalciferol, 25 MCG (1000 UT) TABS Take 2,000 Units by mouth daily.   cephALEXin (KEFLEX) 500 MG capsule Take 1 capsule (500 mg total) by mouth 3 (three) times daily. (Patient not taking: Reported on 12/11/2022)   No facility-administered encounter medications on file as of 12/11/2022.    Allergies (verified) Anesthetics, amide; Codeine; Nitrofurantoin; Orphenadrine citrate; Pravastatin; Simvastatin; Ibuprofen; and Pentosan polysulfate sodium   History: Past Medical History:  Diagnosis Date   Arthritis    "fingers" (02/27/2013)   Basilar artery stenosis    90%   Bladder spasms    Blood transfusion 1960's   w/childbirth  CAD (coronary artery disease)    a. 06/2009 cath: severe stenosis RCA with placement drug eluting stent;  b. 08/2011 cath: normal left main, luminal LAD irregularities, luminal LCx irregularities, luminal RCA irregularities and no ISR to mid-RCA DES, LVEF 60-65%; c. 02/2013 nonischemic CL, EF 79%.   Family history of anesthesia complication    "first cousin had fever and almost died" (2013-03-17)   Fibromyalgia    HLD (hyperlipidemia) 01/1997    Hypertension    Hypothyroidism 1990   IC (interstitial cystitis)    Labile hypertension 08/1991   Left-sided carotid artery obstruction    Malignant hyperthermia    "told to warn everybody before I had surgery that my 1st cousin had malignant hyperthermia; I've had symptoms of it too" (03/17/13)   Obesity    Pacemaker 11/11/2011   dual chamber pacemaker   Pneumonia    "twice" (2013/03/17)   Second degree Mobitz II AV block    TIA (transient ischemic attack)    multiple/notes 08/02/2015   Past Surgical History:  Procedure Laterality Date   basilar artery angioplasty  09/04/08   sm right brachial hematoma   Bilateral Vert & Subclavian Angiogram  07/19/08   85-90% stenosis mid basilar art; occluded left vert artery; occluded left comm carotid artery w/ collat flow   BLADDER SUSPENSION  1974   CARDIAC CATHETERIZATION  1993   normal   carotid arteriogram     bilat   Carotid US  11/93   Occl L comm carotid   Carotid US  4/98   L comm occl R-ok   Carotid US  01/30/03   L occl  R less 40%   CATARACT EXTRACTION W/ INTRAOCULAR LENS IMPLANT Bilateral 2007-2008   Cath single vess dz  06/13/09   70% stenosis RCA EF 65-70%   CHOLECYSTECTOMY  1985   CORONARY ANGIOPLASTY WITH STENT PLACEMENT  06/19/09   DEXA  10/15/04   Troch -0.4 o/w pos   EMG  1/01   LE, RUE normal   EYE SURGERY     INSERT / REPLACE / REMOVE PACEMAKER  11/11/2011   MDT Adapta L implanted by Dr Johney Frame. [employer]   LEFT HEART CATHETERIZATION WITH CORONARY ANGIOGRAM N/A 08/10/2011   Procedure: LEFT HEART CATHETERIZATION WITH CORONARY ANGIOGRAM;  Surgeon: Kathleene Hazel, MD;  Location: [facility] CATH LAB;  Service: Cardiovascular;  Laterality: N/A;   LEFT HEART CATHETERIZATION WITH CORONARY ANGIOGRAM N/A 07/28/2013   Procedure: LEFT HEART CATHETERIZATION WITH CORONARY ANGIOGRAM;  Surgeon: Kathleene Hazel, MD;  Location: [facility] CATH LAB;  Service: Cardiovascular;  Laterality: N/A;   PERMANENT PACEMAKER INSERTION N/A 11/11/2011    Procedure: PERMANENT PACEMAKER INSERTION;  Surgeon: Hillis Range, MD;  Location: [facility] CATH LAB;  Service: Cardiovascular;  Laterality: N/A;   RENAL ANGIOPLASTY  1993   normal   US RENAL/AORTA  12/93   Normal   VAGINAL HYSTERECTOMY  1974   dysmennorhea   Visual evoked response  11/93; 1/01   normal   Family History  Problem Relation Age of Onset   Lung cancer Father    Heart attack Mother    Lymphoma Mother        s/p chemo   Stroke Mother        x 2   Diabetes Brother    Breast cancer Maternal Aunt    Colon cancer Neg Hx    Social History   Socioeconomic History   Marital status: Widowed    Spouse name: Not on file  Number of children: 1   Years of education: Not on file   Highest education level: Not on file  Occupational History   Occupation: Bookkeeper-part time   Occupation: retired    Associate Professor: RETIRED  Tobacco Use   Smoking status: Never   Smokeless tobacco: Never  Vaping Use   Vaping status: Never Used  Substance and Sexual Activity   Alcohol use: No    Alcohol/week: 0.0 standard drinks of alcohol   Drug use: No   Sexual activity: Not Currently    Birth control/protection: Post-menopausal  Other Topics Concern   Not on file  Social History Narrative   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   Widowed 2021.  Was married 1962; 1 child   Drinks coffee and coke daily    Social Determinants of Health   Financial Resource Strain: Low Risk  (12/11/2022)   Overall Financial Resource Strain (CARDIA)    Difficulty of Paying Living Expenses: Not hard at all  Food Insecurity: No Food Insecurity (12/11/2022)   Hunger Vital Sign    Worried About Running Out of Food in the Last Year: Never true    Ran Out of Food in the Last Year: Never true  Transportation Needs: No Transportation Needs (12/11/2022)   PRAPARE - Administrator, Civil Service (Medical): No    Lack of Transportation (Non-Medical): No  Physical Activity:  Insufficiently Active (12/11/2022)   Exercise Vital Sign    Days of Exercise per Week: 7 days    Minutes of Exercise per Session: 20 min  Stress: No Stress Concern Present (12/11/2022)   [employer] of Occupational Health - Occupational Stress Questionnaire    Feeling of Stress : Not at all  Social Connections: Socially Isolated (12/11/2022)   Social Connection and Isolation Panel [NHANES]    Frequency of Communication with Friends and Family: More than three times a week    Frequency of Social Gatherings with Friends and Family: Never    Attends Religious Services: Never    Database administrator or Organizations: No    Attends Banker Meetings: Never    Marital Status: Widowed    Tobacco Counseling Counseling given: Not Answered  Clinical Intake:  Pre-visit preparation completed: No  Pain : No/denies pain    BMI - recorded: 30.51 Nutritional Status: BMI > 30  Obese Nutritional Risks: None Diabetes: No  How often do you need to have someone help you when you read instructions, pamphlets, or other written materials from your doctor or pharmacy?: 1 - Never  Interpreter Needed?: No  Comments: lives alone Information entered by :: B.Latresha Yahr,   Activities of Daily Living    12/11/2022    8:35 AM 12/24/2021   11:35 AM  In your present state of health, do you have any difficulty performing the following activities:  Hearing? 0 0  Vision? 0 0  Difficulty concentrating or making decisions? 0 0  Walking or climbing stairs? 0 1  Comment  Paces self when walking  Dressing or bathing? 0 0  Doing errands, shopping? 0 0  Preparing Food and eating ? N N  Using the Toilet? N N  In the past six months, have you accidently leaked urine? N N  Do you have problems with loss of bowel control? N N  Managing your Medications? N N  Managing your Finances? N N  Housekeeping or managing your Housekeeping? N N    Patient Care Team: Crawford Givens  S, MD as PCP -  General (Family Medicine) Kathleene Hazel, MD as PCP - Cardiology (Cardiology) Kathleene Hazel, MD as Consulting Physician (Cardiology) Hillis Range, MD (Inactive) as Consulting Physician (Cardiology) Galen Manila, MD as Referring Physician (Ophthalmology)  Indicate any recent Medical Services you may have received from other than [facility] providers in the past year (date may be approximate).     Assessment:   This is a routine wellness examination for Dixon.  Hearing/Vision screen Hearing Screening - Comments:: Pt says her hearing is good Vision Screening - Comments:: Pt says her vision is good  Dr Druscilla Brownie   Goals Addressed             This Visit's Progress    Increase physical activity   Not on track    Starting 08/19/2016, I will continue to walk at least 30 min daily.      COMPLETED: Patient Stated   On track    11/18/2018, I will maintain and continue medications as prescribed.      Patient Stated   Not on track    12/19/2019, I will continue to walk daily for about 30 minutes.     COMPLETED: Patient Stated   On track    Would like to maintain current routine       Depression Screen    12/11/2022    8:32 AM 08/28/2022    3:02 PM 08/21/2022    2:58 PM 08/10/2022    9:29 AM 07/27/2022    8:50 AM 03/02/2022   10:08 AM 12/24/2021   11:27 AM  PHQ 2/9 Scores  PHQ - 2 Score 0 0 0 0 0 0 0  PHQ- 9 Score  0 0 0 0 1     Fall Risk    12/11/2022    8:27 AM 08/28/2022    3:02 PM 08/21/2022    2:57 PM 08/10/2022    9:29 AM 07/27/2022    8:50 AM  Fall Risk   Falls in the past year? 0 1 1 1  0  Number falls in past yr: 0 1 1 1  0  Injury with Fall? 0 0 0 0 0  Risk for fall due to : No Fall Risks No Fall Risks No Fall Risks No Fall Risks No Fall Risks  Follow up Falls prevention discussed;Education provided Falls evaluation completed Falls evaluation completed Falls evaluation completed Falls evaluation completed    MEDICARE RISK AT HOME: Medicare Risk at  Home Any stairs in or around the home?: Yes If so, are there any without handrails?: Yes Home free of loose throw rugs in walkways, pet beds, electrical cords, etc?: Yes Adequate lighting in your home to reduce risk of falls?: Yes Life alert?: No Use of a cane, walker or w/c?: No Grab bars in the bathroom?: Yes Shower chair or bench in shower?: No Elevated toilet seat or a handicapped toilet?: No  TIMED UP AND GO:  Was the test performed?  No    Cognitive Function:    12/19/2019   11:21 AM 11/18/2018    9:58 AM 08/20/2017   10:58 AM 08/19/2016   10:28 AM 05/28/2015   10:12 AM  MMSE - Mini Mental State Exam  Not completed: Refused      Orientation to time  5 5 5 5   Orientation to Place  5 5 5 5   Registration  3 3 3 3   Attention/ Calculation  5 0 0 0  Recall  3 3 3 3   Language-  name 2 objects   0 0 0  Language- repeat  1 1 1 1   Language- follow 3 step command   3 3 3   Language- read & follow direction   0 0 0  Write a sentence   0 0 0  Copy design   0 0 0  Total score   20 20 20         12/11/2022    8:37 AM 12/24/2021   11:40 AM  6CIT Screen  What Year? 0 points 0 points  What month? 0 points 0 points  What time? 0 points 0 points  Count back from 20 0 points 0 points  Months in reverse 0 points   Repeat phrase 0 points 0 points  Total Score 0 points     Immunizations Immunization History  Administered Date(s) Administered   Fluad Quad(high Dose 65+) 10/11/2018, 10/21/2019, 11/08/2020, 10/28/2021   Fluad Trivalent(High Dose 65+) 10/22/2022   Influenza Split 11/05/2010   Influenza,inj,Quad PF,6+ Mos 10/13/2012, 10/12/2013, 10/12/2014, 10/10/2015, 10/22/2016, 09/23/2017   Pneumococcal Conjugate-13 05/28/2015   Pneumococcal Polysaccharide-23 09/05/2008   Td 12/05/1997    TDAP status: Up to date  Flu Vaccine status: Up to date  Pneumococcal vaccine status: Up to date  Covid-19 vaccine status: Declined, Education has been provided regarding the importance  of this vaccine but patient still declined. Advised may receive this vaccine at local pharmacy or Health Dept.or vaccine clinic. Aware to provide a copy of the vaccination record if obtained from local pharmacy or Health Dept. Verbalized acceptance and understanding.  Qualifies for Shingles Vaccine? Yes   Zostavax completed No   Shingrix Completed?: No.    Education has been provided regarding the importance of this vaccine. Patient has been advised to call insurance company to determine out of pocket expense if they have not yet received this vaccine. Advised may also receive vaccine at local pharmacy or Health Dept. Verbalized acceptance and understanding.  Screening Tests Health Maintenance  Topic Date Due   Zoster Vaccines- Shingrix (1 of 2) Never done   DTaP/Tdap/Td (2 - Tdap) 12/06/2007   Medicare Annual Wellness (AWV)  12/11/2023   Pneumonia Vaccine 7+ Years old  Completed   INFLUENZA VACCINE  Completed   DEXA SCAN  Completed   HPV VACCINES  Aged Out   COVID-19 Vaccine  Discontinued    Health Maintenance  Health Maintenance Due  Topic Date Due   Zoster Vaccines- Shingrix (1 of 2) Never done   DTaP/Tdap/Td (2 - Tdap) 12/06/2007    Colorectal cancer screening: No longer required.   Mammogram status: No longer required due to age.  Bone Density status: Completed 03/09/2013. Results reflect: Bone density results: OSTEOPENIA. Repeat every 3 years.  Lung Cancer Screening: (Low Dose CT Chest recommended if Age 45-80 years, 20 pack-year currently smoking OR have quit w/in 15years.) does not qualify.   Lung Cancer Screening Referral: no  Additional Screening:  Hepatitis C Screening: does not qualify; Completed no  Vision Screening: Recommended annual ophthalmology exams for early detection of glaucoma and other disorders of the eye. Is the patient up to date with their annual eye exam?  Yes  Who is the provider or what is the name of the office in which the patient attends  annual eye exams? Dr Florentina Jenny If pt is not established with a provider, would they like to be referred to a provider to establish care? No .   Dental Screening: Recommended annual dental exams for proper oral hygiene  Diabetic Foot Exam: n/a  Community Resource Referral / Chronic Care Management: CRR required this visit?  No   CCM required this visit?  No     Plan:     I have personally reviewed and noted the following in the patient's chart:   Medical and social history Use of alcohol, tobacco or illicit drugs  Current medications and supplements including opioid prescriptions. Patient is not currently taking opioid prescriptions. Functional ability and status Nutritional status Physical activity Advanced directives List of other physicians Hospitalizations, surgeries, and ER visits in previous 12 months Vitals Screenings to include cognitive, depression, and falls Referrals and appointments  In addition, I have reviewed and discussed with patient certain preventive protocols, quality metrics, and best practice recommendations. A written personalized care plan for preventive services as well as general preventive health recommendations were provided to patient.     Sue Lush, LPN   16/01/958   After Visit Summary: (Declined) Due to this being a telephonic visit, with patients personalized plan was offered to patient but patient Declined AVS at this time   Nurse Notes: The patient states she is doing better. She relays she has had an episode of diarrhea for a couple of weeks but is down to two loose stools per day now. She states other than that she is good and has no concerns or questions at this time. Pt encouraged to call us if symptoms worsen or rtn.

## 2022-12-16 NOTE — Progress Notes (Signed)
CT scan is negative for parathyroid adenoma.  USN is not definitive.  Nuclear scan is negative.  I would like to hold off on surgery for now.  I would like to see the patient back in March 2025 with a repeat calcium level, PTH level, and 25 hydroxy Vit D level.  Tresa Endo - please arrange the above and notify patient.  Darnell Level, MD [facility] practice Office: [phone]

## 2022-12-21 ENCOUNTER — Ambulatory Visit (INDEPENDENT_AMBULATORY_CARE_PROVIDER_SITE_OTHER): Payer: Medicare HMO | Admitting: Family Medicine

## 2022-12-21 ENCOUNTER — Encounter: Payer: Self-pay | Admitting: Family Medicine

## 2022-12-21 VITALS — BP 152/70 | HR 71 | Temp 99.1°F | Ht <= 58 in | Wt 131.8 lb

## 2022-12-21 DIAGNOSIS — R197 Diarrhea, unspecified: Secondary | ICD-10-CM

## 2022-12-21 NOTE — Patient Instructions (Signed)
Go to the lab on the way out.   If you have mychart we'll likely use that to update you.    Take care.  Glad to see you. It would be okay to try a probiotic in the meantime.

## 2022-12-21 NOTE — Progress Notes (Unsigned)
Diarrhea for the last 3 weeks, possibly longer.  Started after last OV.  Her back pain is better in the meantime.   Down 10 lbs.  No blood in stool.  Yellowish liquid stools.  No fevers.  No vomiting.  No black stools.  No abd pain.    Meds, vitals, and allergies reviewed.   ROS: Per HPI unless specifically indicated in ROS section   GEN: nad, alert and oriented HEENT: mucous membranes moist NECK: supple w/o LA CV: rrr.  PULM: ctab, no inc wob ABD: soft, +bs, not ttp.  EXT: no edema SKIN: well perfused.

## 2022-12-22 ENCOUNTER — Other Ambulatory Visit: Payer: Self-pay

## 2022-12-22 DIAGNOSIS — R197 Diarrhea, unspecified: Secondary | ICD-10-CM | POA: Diagnosis not present

## 2022-12-22 LAB — CBC WITH DIFFERENTIAL/PLATELET
Basophils Absolute: 0.1 10*3/uL (ref 0.0–0.1)
Basophils Relative: 0.8 % (ref 0.0–3.0)
Eosinophils Absolute: 0.1 10*3/uL (ref 0.0–0.7)
Eosinophils Relative: 1.8 % (ref 0.0–5.0)
HCT: 45.6 % (ref 36.0–46.0)
Hemoglobin: 15.1 g/dL — ABNORMAL HIGH (ref 12.0–15.0)
Lymphocytes Relative: 32.5 % (ref 12.0–46.0)
Lymphs Abs: 2.2 10*3/uL (ref 0.7–4.0)
MCHC: 33 g/dL (ref 30.0–36.0)
MCV: 93.3 fL (ref 78.0–100.0)
Monocytes Absolute: 0.6 10*3/uL (ref 0.1–1.0)
Monocytes Relative: 8.7 % (ref 3.0–12.0)
Neutro Abs: 3.9 10*3/uL (ref 1.4–7.7)
Neutrophils Relative %: 56.2 % (ref 43.0–77.0)
Platelets: 244 10*3/uL (ref 150.0–400.0)
RBC: 4.88 Mil/uL (ref 3.87–5.11)
RDW: 13.5 % (ref 11.5–15.5)
WBC: 6.9 10*3/uL (ref 4.0–10.5)

## 2022-12-22 LAB — COMPREHENSIVE METABOLIC PANEL
ALT: 11 U/L (ref 0–35)
AST: 14 U/L (ref 0–37)
Albumin: 4.3 g/dL (ref 3.5–5.2)
Alkaline Phosphatase: 55 U/L (ref 39–117)
BUN: 12 mg/dL (ref 6–23)
CO2: 29 meq/L (ref 19–32)
Calcium: 10 mg/dL (ref 8.4–10.5)
Chloride: 105 meq/L (ref 96–112)
Creatinine, Ser: 0.79 mg/dL (ref 0.40–1.20)
GFR: 70.79 mL/min (ref >60.00)
Glucose, Bld: 95 mg/dL (ref 70–99)
Potassium: 3.8 meq/L (ref 3.5–5.1)
Sodium: 141 meq/L (ref 135–145)
Total Bilirubin: 0.5 mg/dL (ref 0.2–1.2)
Total Protein: 6.7 g/dL (ref 6.0–8.3)

## 2022-12-24 DIAGNOSIS — R197 Diarrhea, unspecified: Secondary | ICD-10-CM | POA: Insufficient documentation

## 2022-12-24 LAB — GASTROINTESTINAL PATHOGEN PNL
CampyloBacter Group: NOT DETECTED
Norovirus GI/GII: NOT DETECTED
Rotavirus A: NOT DETECTED
Salmonella species: NOT DETECTED
Shiga Toxin 1: NOT DETECTED
Shiga Toxin 2: NOT DETECTED
Shigella Species: NOT DETECTED
Vibrio Group: NOT DETECTED
Yersinia enterocolitica: NOT DETECTED

## 2022-12-24 NOTE — Assessment & Plan Note (Signed)
Unclear source, discussed with patient about differentiating between malabsorptive versus infectious source.  See notes on labs, see orders.  At this point okay for outpatient follow-up.  She was asking about trying a probiotic and I think that is reasonable to try for now.

## 2022-12-25 LAB — C. DIFFICILE GDH AND TOXIN A/B
GDH ANTIGEN: NOT DETECTED
MICRO NUMBER:: 15860706
SPECIMEN QUALITY:: ADEQUATE
TOXIN A AND B: NOT DETECTED

## 2022-12-25 LAB — FECAL FAT, QUALITATIVE: FECAL FAT, QUALITATIVE: NORMAL

## 2022-12-28 LAB — PANCREATIC ELASTASE, FECAL: Pancreatic Elastase-1, Stool: 523 ug/g (ref >200)

## 2023-01-05 ENCOUNTER — Telehealth: Payer: Self-pay | Admitting: Family Medicine

## 2023-01-05 NOTE — Telephone Encounter (Signed)
Noted. Thanks.

## 2023-01-05 NOTE — Telephone Encounter (Signed)
 Copied from CRM [id number]. Topic: General - Other >> Jan 05, 2023 10:55 AM Maisie C wrote: Reason for CRM: Patient called and stated that she just wanted to let Dr. Cleatus know that her sxs of diarrhea has improved and she feels much better. She mentioned that her sxs were going on for 6 weeks and she had previously lost a lot of weight. She stated that if she starts to notice sxs again, she will call back.

## 2023-01-13 ENCOUNTER — Ambulatory Visit: Payer: Medicare HMO

## 2023-01-13 DIAGNOSIS — I441 Atrioventricular block, second degree: Secondary | ICD-10-CM | POA: Diagnosis not present

## 2023-01-13 LAB — CUP PACEART REMOTE DEVICE CHECK
Battery Impedance: 2419 Ohm
Battery Remaining Longevity: 27 mo
Battery Voltage: 2.73 V
Brady Statistic AP VP Percent: 41 %
Brady Statistic AP VS Percent: 0 %
Brady Statistic AS VP Percent: 59 %
Brady Statistic AS VS Percent: 0 %
Date Time Interrogation Session: 20250108080709
Implantable Lead Connection Status: 753985
Implantable Lead Connection Status: 753985
Implantable Lead Implant Date: 20131106
Implantable Lead Implant Date: 20131106
Implantable Lead Location: 753859
Implantable Lead Location: 753860
Implantable Lead Model: 5076
Implantable Lead Model: 5092
Implantable Pulse Generator Implant Date: 20131106
Lead Channel Impedance Value: 447 Ohm
Lead Channel Impedance Value: 568 Ohm
Lead Channel Pacing Threshold Amplitude: 0.375 V
Lead Channel Pacing Threshold Amplitude: 0.875 V
Lead Channel Pacing Threshold Pulse Width: 0.4 ms
Lead Channel Pacing Threshold Pulse Width: 0.4 ms
Lead Channel Setting Pacing Amplitude: 2 V
Lead Channel Setting Pacing Amplitude: 2.5 V
Lead Channel Setting Pacing Pulse Width: 0.4 ms
Lead Channel Setting Sensing Sensitivity: 2 mV
Zone Setting Status: 755011
Zone Setting Status: 755011

## 2023-02-25 NOTE — Progress Notes (Signed)
 Remote pacemaker transmission.

## 2023-02-26 ENCOUNTER — Other Ambulatory Visit (INDEPENDENT_AMBULATORY_CARE_PROVIDER_SITE_OTHER): Payer: Medicare HMO

## 2023-02-26 ENCOUNTER — Other Ambulatory Visit: Payer: Medicare HMO

## 2023-02-26 ENCOUNTER — Other Ambulatory Visit: Payer: Self-pay | Admitting: Family Medicine

## 2023-02-26 DIAGNOSIS — I10 Essential (primary) hypertension: Secondary | ICD-10-CM

## 2023-02-26 DIAGNOSIS — E039 Hypothyroidism, unspecified: Secondary | ICD-10-CM

## 2023-02-26 DIAGNOSIS — R7309 Other abnormal glucose: Secondary | ICD-10-CM

## 2023-02-26 DIAGNOSIS — E559 Vitamin D deficiency, unspecified: Secondary | ICD-10-CM

## 2023-02-26 LAB — COMPREHENSIVE METABOLIC PANEL
ALT: 9 U/L (ref 0–35)
AST: 14 U/L (ref 0–37)
Albumin: 4.3 g/dL (ref 3.5–5.2)
Alkaline Phosphatase: 58 U/L (ref 39–117)
BUN: 12 mg/dL (ref 6–23)
CO2: 31 meq/L (ref 19–32)
Calcium: 10.2 mg/dL (ref 8.4–10.5)
Chloride: 104 meq/L (ref 96–112)
Creatinine, Ser: 0.88 mg/dL (ref 0.40–1.20)
GFR: 62.11 mL/min (ref >60.00)
Glucose, Bld: 97 mg/dL (ref 70–99)
Potassium: 4.6 meq/L (ref 3.5–5.1)
Sodium: 141 meq/L (ref 135–145)
Total Bilirubin: 0.7 mg/dL (ref 0.2–1.2)
Total Protein: 6.5 g/dL (ref 6.0–8.3)

## 2023-02-26 LAB — VITAMIN D 25 HYDROXY (VIT D DEFICIENCY, FRACTURES): VITD: 58.89 ng/mL (ref 30.00–100.00)

## 2023-02-26 LAB — CBC WITH DIFFERENTIAL/PLATELET
Basophils Absolute: 0 10*3/uL (ref 0.0–0.1)
Basophils Relative: 0.5 % (ref 0.0–3.0)
Eosinophils Absolute: 0.1 10*3/uL (ref 0.0–0.7)
Eosinophils Relative: 1.8 % (ref 0.0–5.0)
HCT: 46.4 % — ABNORMAL HIGH (ref 36.0–46.0)
Hemoglobin: 15.2 g/dL — ABNORMAL HIGH (ref 12.0–15.0)
Lymphocytes Relative: 30.1 % (ref 12.0–46.0)
Lymphs Abs: 1.6 10*3/uL (ref 0.7–4.0)
MCHC: 32.8 g/dL (ref 30.0–36.0)
MCV: 92.2 fL (ref 78.0–100.0)
Monocytes Absolute: 0.4 10*3/uL (ref 0.1–1.0)
Monocytes Relative: 7.4 % (ref 3.0–12.0)
Neutro Abs: 3.2 10*3/uL (ref 1.4–7.7)
Neutrophils Relative %: 60.2 % (ref 43.0–77.0)
Platelets: 241 10*3/uL (ref 150.0–400.0)
RBC: 5.03 Mil/uL (ref 3.87–5.11)
RDW: 12.7 % (ref 11.5–15.5)
WBC: 5.3 10*3/uL (ref 4.0–10.5)

## 2023-02-26 LAB — LIPID PANEL
Cholesterol: 183 mg/dL (ref 0–200)
HDL: 49.8 mg/dL (ref >39.00)
LDL Cholesterol: 106 mg/dL — ABNORMAL HIGH (ref 0–99)
NonHDL: 132.94
Total CHOL/HDL Ratio: 4
Triglycerides: 137 mg/dL (ref 0.0–149.0)
VLDL: 27.4 mg/dL (ref 0.0–40.0)

## 2023-02-26 LAB — HEMOGLOBIN A1C: Hgb A1c MFr Bld: 5.1 % (ref 4.6–6.5)

## 2023-02-26 LAB — TSH: TSH: 0.06 u[IU]/mL — ABNORMAL LOW (ref 0.35–5.50)

## 2023-02-27 LAB — PTH, INTACT AND CALCIUM
Calcium: 10.7 mg/dL — ABNORMAL HIGH (ref 8.6–10.4)
PTH: 30 pg/mL (ref 16–77)

## 2023-03-05 ENCOUNTER — Ambulatory Visit (INDEPENDENT_AMBULATORY_CARE_PROVIDER_SITE_OTHER): Payer: Medicare HMO | Admitting: Family Medicine

## 2023-03-05 ENCOUNTER — Encounter: Payer: Self-pay | Admitting: Family Medicine

## 2023-03-05 VITALS — BP 138/76 | HR 70 | Temp 98.6°F | Ht 58.25 in | Wt 121.0 lb

## 2023-03-05 DIAGNOSIS — R1310 Dysphagia, unspecified: Secondary | ICD-10-CM

## 2023-03-05 DIAGNOSIS — E039 Hypothyroidism, unspecified: Secondary | ICD-10-CM | POA: Diagnosis not present

## 2023-03-05 DIAGNOSIS — R634 Abnormal weight loss: Secondary | ICD-10-CM

## 2023-03-05 DIAGNOSIS — Z7189 Other specified counseling: Secondary | ICD-10-CM

## 2023-03-05 DIAGNOSIS — I10 Essential (primary) hypertension: Secondary | ICD-10-CM | POA: Diagnosis not present

## 2023-03-05 DIAGNOSIS — Z0000 Encounter for general adult medical examination without abnormal findings: Secondary | ICD-10-CM

## 2023-03-05 DIAGNOSIS — Z66 Do not resuscitate: Secondary | ICD-10-CM

## 2023-03-05 MED ORDER — LEVOTHYROXINE SODIUM 88 MCG PO TABS: 88.0000 ug | ORAL_TABLET | Freq: Every day | ORAL | 3 refills | Status: DC

## 2023-03-05 MED ORDER — AMLODIPINE BESYLATE 5 MG PO TABS: 5.0000 mg | ORAL_TABLET | Freq: Two times a day (BID) | ORAL | Status: DC

## 2023-03-05 NOTE — Progress Notes (Signed)
 Hypertension:    Using medication without problems or lightheadedness: has been lightheaded, see below.   Chest pain with exertion: no Edema:no Short of breath:no Statin intolerant.   Larey Seat recently.  No LOC. She was lightheaded.  L upper arm bruised.    She has noted some swallowing changes in the last few months.  Declined ST and swallowing study.  Cautions d/w pt.    Prev L axilla pain resolved.  Prev R axillary puffiness resolved.  Hypothyroidism.  TSH low, d/w pt about dose change.  Labs d/w pt.  Had been taking a day of levothyroxine.  Weight down from prior.    H/o TIA noted. Has pacer, has echo pending.  Still on plavix.    PTH and calcium d/w pt.  She has f/u pending with Dr. Gerrit Friends.    Still dealing with R hand numbness and ROM changes in the lat few months.  She has extra help set up now, Selena Batten was here at the visit.  She isn't driving or writing.    Flu shot 2024 covid vaccine prev d/w PNA up to date Tetanus 1999. Shingles d/w pt.  RSV d/w pt.    Colonoscopy declined at age 83.   Mammogram- dw pt, encouraged.  She declined at this point.   DXA d/w pt.  She'll consider.  Defer for now per patient request.   Advance directive- Son Elodia Haviland, brother Jeananne Rama, and granddaughter Camelia Stelzner all equally designated if patient were incapacitated.  She explicitly wanted to be a DNR.    Meds, vitals, and allergies reviewed.   ROS: Per HPI unless specifically indicated in ROS section   GEN: nad, alert and oriented HEENT: ncat NECK: supple w/o LA in nec,  No axilla LA or mass noted B.  CV: rrr SEM noted.  PULM: ctab, no inc wob ABD: soft, +bs EXT: no edema SKIN: no acute rash but L upper arm bruised.

## 2023-03-05 NOTE — Patient Instructions (Addendum)
 Change amlodipine to 5mg  twice a day.  Let me know how that goes.  Take care.  Glad to see you.  Change to levothyroxine.  Recheck TSH in about 2 months.  Lab visit.  You don't have to fast.   Sit up, small bites, chew food well.    Let me check with your other docs in the meantime.

## 2023-03-07 ENCOUNTER — Telehealth: Payer: Self-pay | Admitting: Family Medicine

## 2023-03-07 DIAGNOSIS — R634 Abnormal weight loss: Secondary | ICD-10-CM | POA: Insufficient documentation

## 2023-03-07 NOTE — Telephone Encounter (Signed)
 This patient has lost 20 lbs in the last few months.   She has low TSH and dysphagia which could contribute to weight loss.  She didn't want to work up her dysphagia.  Unclear if aortic stenosis contributes.   Can her echo get moved up?  In the absence of other advice, I am going to offer CT C/A/P to patient.    Please let me know your thoughts and I appreciate your help.

## 2023-03-07 NOTE — Assessment & Plan Note (Signed)
 Change to levothyroxine.  Recheck TSH in about 2 months.  D/w pt.

## 2023-03-07 NOTE — Assessment & Plan Note (Signed)
 She has noted some swallowing changes in the last few months.  Declined ST and swallowing study.  Cautions d/w pt.

## 2023-03-07 NOTE — Assessment & Plan Note (Signed)
Advance directive- Son Kenneth Parsley, brother Jerry Kenneth Merritt, and granddaughter Kolbe Ray Maiello all equally designated if patient were incapacitated.  She explicitly wanted to be a DNR.   

## 2023-03-07 NOTE — Assessment & Plan Note (Addendum)
 Statin intolerant.   Change amlodipine to 5mg  twice a day.  She can let me know how she feels with that change, ie trying to limit orthostasis risk.

## 2023-03-07 NOTE — Assessment & Plan Note (Signed)
 She has low TSH and dysphagia, both cough contribute to weight loss.  Unclear if aortic stenosis contributes.  See following phone note.  I am asking for input from her other doctors.

## 2023-03-07 NOTE — Assessment & Plan Note (Signed)
 She has f/u pending with Dr. Gerrit Friends.

## 2023-03-07 NOTE — Assessment & Plan Note (Signed)
 Flu shot 2024 covid vaccine prev d/w PNA up to date Tetanus 1999. Shingles d/w pt.  RSV d/w pt.    Colonoscopy declined at age 83.   Mammogram- dw pt, encouraged.  She declined at this point.   DXA d/w pt.  She'll consider.  Defer for now per patient request.   Advance directive- Son Terilynn Buresh, brother Jeananne Rama, and granddaughter Carilyn Woolston all equally designated if patient were incapacitated.  She explicitly wanted to be a DNR.

## 2023-03-08 NOTE — Telephone Encounter (Signed)
 Patient states that she is feeling much better since changing her medication. She advises that she has been eating 3 meals a day. She rather wait until after she sees Dr. Gerrit Friends and at that time she will see if she has gained any weight back. But for now she will hold off on the CT and call back on Thursday or Friday of next week and let you know what she will do.

## 2023-03-08 NOTE — Telephone Encounter (Signed)
 Please update patient.  I checked with Dr. Gerrit Friends and Dr. Clifton James.  I appreciate the help of all involved.  Lowering her levothyroxine dose should help with her abnormal TSH.  I think it makes sense to get CT chest/abd/pelvis re: weight loss.  I have the order pended below.  Let me know if she consents.  I think it makes sense to get the scan done.  Thanks.

## 2023-03-09 NOTE — Telephone Encounter (Signed)
Noted.  Will await update from patient.  

## 2023-03-17 DIAGNOSIS — E210 Primary hyperparathyroidism: Secondary | ICD-10-CM | POA: Diagnosis not present

## 2023-04-14 ENCOUNTER — Ambulatory Visit (INDEPENDENT_AMBULATORY_CARE_PROVIDER_SITE_OTHER): Payer: Medicare HMO

## 2023-04-14 DIAGNOSIS — I441 Atrioventricular block, second degree: Secondary | ICD-10-CM | POA: Diagnosis not present

## 2023-04-14 LAB — CUP PACEART REMOTE DEVICE CHECK
Battery Impedance: 2838 Ohm
Battery Remaining Longevity: 23 mo
Battery Voltage: 2.73 V
Brady Statistic AP VP Percent: 42 %
Brady Statistic AP VS Percent: 0 %
Brady Statistic AS VP Percent: 58 %
Brady Statistic AS VS Percent: 0 %
Date Time Interrogation Session: 20250409080307
Implantable Lead Connection Status: 753985
Implantable Lead Connection Status: 753985
Implantable Lead Implant Date: 20131106
Implantable Lead Implant Date: 20131106
Implantable Lead Location: 753859
Implantable Lead Location: 753860
Implantable Lead Model: 5076
Implantable Lead Model: 5092
Implantable Pulse Generator Implant Date: 20131106
Lead Channel Impedance Value: 459 Ohm
Lead Channel Impedance Value: 591 Ohm
Lead Channel Pacing Threshold Amplitude: 0.5 V
Lead Channel Pacing Threshold Amplitude: 1.125 V
Lead Channel Pacing Threshold Pulse Width: 0.4 ms
Lead Channel Pacing Threshold Pulse Width: 0.4 ms
Lead Channel Setting Pacing Amplitude: 2 V
Lead Channel Setting Pacing Amplitude: 2.5 V
Lead Channel Setting Pacing Pulse Width: 0.4 ms
Lead Channel Setting Sensing Sensitivity: 2 mV
Zone Setting Status: 755011
Zone Setting Status: 755011

## 2023-04-16 ENCOUNTER — Other Ambulatory Visit (INDEPENDENT_AMBULATORY_CARE_PROVIDER_SITE_OTHER)

## 2023-04-16 DIAGNOSIS — E039 Hypothyroidism, unspecified: Secondary | ICD-10-CM | POA: Diagnosis not present

## 2023-04-16 LAB — TSH: TSH: 0.39 u[IU]/mL (ref 0.35–5.50)

## 2023-04-19 ENCOUNTER — Telehealth: Payer: Self-pay

## 2023-04-19 NOTE — Telephone Encounter (Signed)
 Copied from CRM [id number]. Topic: General - Other >> Apr 19, 2023 12:14 PM Kelsey Dunn wrote: Reason for CRM: patient is calling in  regarding a call back from the office she stated that someone called her.

## 2023-04-21 NOTE — Telephone Encounter (Signed)
 Closing encounter patient notified of results

## 2023-04-23 ENCOUNTER — Other Ambulatory Visit: Payer: Self-pay | Admitting: Family Medicine

## 2023-05-28 NOTE — Progress Notes (Signed)
 Remote pacemaker transmission.

## 2023-06-07 DIAGNOSIS — H43813 Vitreous degeneration, bilateral: Secondary | ICD-10-CM | POA: Diagnosis not present

## 2023-06-07 DIAGNOSIS — Z961 Presence of intraocular lens: Secondary | ICD-10-CM | POA: Diagnosis not present

## 2023-06-07 DIAGNOSIS — Z0100 Encounter for examination of eyes and vision without abnormal findings: Secondary | ICD-10-CM | POA: Diagnosis not present

## 2023-06-07 DIAGNOSIS — M3501 Sicca syndrome with keratoconjunctivitis: Secondary | ICD-10-CM | POA: Diagnosis not present

## 2023-06-08 ENCOUNTER — Ambulatory Visit (HOSPITAL_COMMUNITY)
Admission: RE | Admit: 2023-06-08 | Discharge: 2023-06-08 | Disposition: A | Discharge status: Home / Self Care | Payer: Medicare HMO | Source: Ambulatory Visit | Attending: Cardiovascular Disease | Admitting: Cardiovascular Disease

## 2023-06-08 ENCOUNTER — Ambulatory Visit: Payer: Self-pay | Admitting: Cardiovascular Disease

## 2023-06-08 DIAGNOSIS — I350 Nonrheumatic aortic (valve) stenosis: Secondary | ICD-10-CM

## 2023-06-08 DIAGNOSIS — I2510 Atherosclerotic heart disease of native coronary artery without angina pectoris: Secondary | ICD-10-CM

## 2023-06-08 LAB — ECHOCARDIOGRAM COMPLETE
AR max vel: 0.89 cm2
AV Area VTI: 0.92 cm2
AV Area mean vel: 0.9 cm2
AV Mean grad: 28 mmHg
AV Peak grad: 41 mmHg
Ao pk vel: 3.2 m/s
Area-P 1/2: 3.11 cm2
S' Lateral: 2.2 cm

## 2023-06-08 NOTE — Telephone Encounter (Signed)
 Called and spoke with patient who verbalized understanding. Repeat ECHO order for next year placed at this time.

## 2023-06-08 NOTE — Telephone Encounter (Signed)
-----   Message from Antoinette Batman sent at 06/08/2023  4:18 PM EDT ----- Her heart is strong. Aortic stenosis is still moderate based on gradient, SVI and DI. Repeat echo one year. cdm

## 2023-07-03 ENCOUNTER — Encounter (HOSPITAL_COMMUNITY): Payer: Self-pay | Admitting: Interventional Radiology

## 2023-07-14 ENCOUNTER — Ambulatory Visit: Payer: Medicare HMO | Attending: Cardiovascular Disease

## 2023-07-14 DIAGNOSIS — I441 Atrioventricular block, second degree: Secondary | ICD-10-CM | POA: Diagnosis not present

## 2023-07-15 LAB — CUP PACEART REMOTE DEVICE CHECK
Battery Impedance: 2931 Ohm
Battery Remaining Longevity: 22 mo
Battery Voltage: 2.72 V
Brady Statistic AP VP Percent: 42 %
Brady Statistic AP VS Percent: 0 %
Brady Statistic AS VP Percent: 58 %
Brady Statistic AS VS Percent: 0 %
Date Time Interrogation Session: 20250709085135
Implantable Lead Connection Status: 753985
Implantable Lead Connection Status: 753985
Implantable Lead Implant Date: 20131106
Implantable Lead Implant Date: 20131106
Implantable Lead Location: 753859
Implantable Lead Location: 753860
Implantable Lead Model: 5076
Implantable Lead Model: 5092
Implantable Pulse Generator Implant Date: 20131106
Lead Channel Impedance Value: 484 Ohm
Lead Channel Impedance Value: 576 Ohm
Lead Channel Pacing Threshold Amplitude: 0.375 V
Lead Channel Pacing Threshold Amplitude: 1 V
Lead Channel Pacing Threshold Pulse Width: 0.4 ms
Lead Channel Pacing Threshold Pulse Width: 0.4 ms
Lead Channel Setting Pacing Amplitude: 2 V
Lead Channel Setting Pacing Amplitude: 2.5 V
Lead Channel Setting Pacing Pulse Width: 0.4 ms
Lead Channel Setting Sensing Sensitivity: 2 mV
Zone Setting Status: 755011
Zone Setting Status: 755011

## 2023-07-19 ENCOUNTER — Ambulatory Visit: Payer: Self-pay | Admitting: Cardiovascular Disease

## 2023-09-15 NOTE — Progress Notes (Unsigned)
 No chief complaint on file.  History of Present Illness: 83 yo female with h/o CAD, HTN, hyperlipidemia, hypothyroidism, AV block s/p PPM, CVA and basilar artery stenosis, mitral regurgitation and aortic stenosis here today for follow up. Left heart cath on 06/13/09 and she was found to have a severe stenosis in the RCA. There was minor disease in the LAD and Circumflex. A drug eluting stent was placed in the RCA. Last cardiac cath in 2015 with stable CAD. She was noted to have second degree AV block while admitted in 2013. Her coreg  was stopped. She was discharged and wore an event monitor. This did not document arrhythmias or advanced AV block. There were no episodes of AV block documented. GXT was performed in our office 09/01/11. She was observed during the study to have a LBBB with exertion. She developed exertional symptoms of fatigue, SOB, and CP which corresponded to her LBBB. She was seen by Dr. Kelsie and continued to have episodes of palpitations and dizziness with moderate activity. A permanent pacemaker was placed on 11/11/11 by Dr. Kelsie. She has been undergoing cerebral angiograms for basilar artery stenosis and has had angiplasty of the basilar artery in August 2010. She is known to have an occluded left common carotid artery. She had a CVA in December 2012. Echo June 2025 with LVEF=55-60%. Moderate asymmetric LVH. Mild MR. Moderate aortic stenosis with mean gradient of 28 mmHg. AVA 0.90 cm2, SVI 50, DI 0.32.   She is here today for follow up. The patient denies any chest pain, dyspnea, palpitations, lower extremity edema, orthopnea, PND, dizziness, near syncope or syncope.    Primary Care Physician: Kelsey Arlyss RAMAN, MD  Past Medical History:  Diagnosis Date   Arthritis    fingers (03-11-13)   Basilar artery stenosis    90%   Bladder spasms    Blood transfusion 1960's   w/childbirth   CAD (coronary artery disease)    a. 06/2009 cath: severe stenosis RCA with placement drug  eluting stent;  b. 08/2011 cath: normal left main, luminal LAD irregularities, luminal LCx irregularities, luminal RCA irregularities and no ISR to mid-RCA DES, LVEF 60-65%; c. 02/2013 nonischemic CL, EF 79%.   Family history of anesthesia complication    first cousin had fever and almost died (03/11/2013)   Fibromyalgia    HLD (hyperlipidemia) 01/1997   Hypertension    Hypothyroidism 1990   IC (interstitial cystitis)    Labile hypertension 08/1991   Left-sided carotid artery obstruction    Malignant hyperthermia    told to warn everybody before I had surgery that my 1st cousin had malignant hyperthermia; I've had symptoms of it too (Mar 11, 2013)   Obesity    Pacemaker 11/11/2011   dual chamber pacemaker   Pneumonia    twice (03/11/2013)   Second degree Mobitz II AV block    TIA (transient ischemic attack)    multiple/notes 08/02/2015    Past Surgical History:  Procedure Laterality Date   basilar artery angioplasty  09/04/08   sm right brachial hematoma   Bilateral Vert & Subclavian Angiogram  07/19/08   85-90% stenosis mid basilar art; occluded left vert artery; occluded left comm carotid artery w/ collat flow   BLADDER SUSPENSION  1974   CARDIAC CATHETERIZATION  1993   normal   carotid arteriogram     bilat   Carotid US   11/93   Occl L comm carotid   Carotid US   4/98   L comm occl R-ok  Carotid US   01/30/03   L occl  R less 40%   CATARACT EXTRACTION W/ INTRAOCULAR LENS IMPLANT Bilateral 2007-2008   Cath single vess dz  06/13/09   70% stenosis RCA EF 65-70%   CHOLECYSTECTOMY  1985   CORONARY ANGIOPLASTY WITH STENT PLACEMENT  06/19/09   DEXA  10/15/04   Troch -0.4 o/w pos   EMG  1/01   LE, RUE normal   EYE SURGERY     INSERT / REPLACE / REMOVE PACEMAKER  11/11/2011   MDT Adapta L implanted by Dr Kelsie. [employer]   LEFT HEART CATHETERIZATION WITH CORONARY ANGIOGRAM N/A 08/10/2011   Procedure: LEFT HEART CATHETERIZATION WITH CORONARY ANGIOGRAM;  Surgeon: Lonni JONETTA Cash, MD;  Location[facility] CATH LAB;  Service: Cardiovascular;  Laterality: N/A;   LEFT HEART CATHETERIZATION WITH CORONARY ANGIOGRAM N/A 07/28/2013   Procedure: LEFT HEART CATHETERIZATION WITH CORONARY ANGIOGRAM;  Surgeon: Lonni JONETTA Cash, MD;  Location: [facility] CATH LAB;  Service: Cardiovascular;  Laterality: N/A;   PERMANENT PACEMAKER INSERTION N/A 11/11/2011   Procedure: PERMANENT PACEMAKER INSERTION;  Surgeon: Lynwood Kelsie, MD;  Location: [facility] CATH LAB;  Service: Cardiovascular;  Laterality: N/A;   RENAL ANGIOPLASTY  1993   normal   US  RENAL/AORTA  12/93   Normal   VAGINAL HYSTERECTOMY  1974   dysmennorhea   Visual evoked response  11/93; 1/01   normal    Current Outpatient Medications  Medication Sig Dispense Refill   acetaminophen  (TYLENOL ) 500 MG tablet Take 1,000 mg by mouth every 8 (eight) hours as needed for moderate pain.     amLODipine  (NORVASC ) 5 MG tablet TAKE 1 TABLET BY MOUTH TWICE A DAY 180 tablet 3   carboxymethylcellulose 1 % ophthalmic solution Apply 1 drop to eye 3 (three) times daily.     clopidogrel  (PLAVIX ) 75 MG tablet TAKE 1 TABLET BY MOUTH EVERY DAY 90 tablet 3   levothyroxine  (SYNTHROID ) 88 MCG tablet Take 1 tablet (88 mcg total) by mouth daily. 90 tablet 3   nitroGLYCERIN  (NITROSTAT ) 0.4 MG SL tablet PLACE 1 TABLET UNDER THE TONGUE EVERY 5 (FIVE) MINUTES AS NEEDED FOR CHEST PAIN (MAX 3 TABLETS) 25 tablet 5   olmesartan  (BENICAR ) 40 MG tablet TAKE 1 TABLET BY MOUTH EVERY DAY 90 tablet 3   Vitamin D , Cholecalciferol , 25 MCG (1000 UT) TABS Take 2,000 Units by mouth daily.     No current facility-administered medications for this visit.    Allergies  Allergen Reactions   Anesthetics, Amide Other (See Comments)    REACTION:FEVER, SYNCOPE   Codeine Nausea And Vomiting and Other (See Comments)    Hallucinations   Nitrofurantoin Nausea Only and Other (See Comments)    Hallucinations   Orphenadrine Citrate Nausea And Vomiting and Other (See Comments)      Hallucination   Pravastatin  Other (See Comments)    Myalgias   Simvastatin  Other (See Comments)    myalgias   Ibuprofen     Would avoid if possible   Pentosan Polysulfate Sodium Rash and Other (See Comments)     rash/ severe headache    Social History   Socioeconomic History   Marital status: Widowed    Spouse name: Not on file   Number of children: 1   Years of education: Not on file   Highest education level: Not on file  Occupational History   Occupation: Bookkeeper-part time   Occupation: retired    Associate Professor: RETIRED  Tobacco Use   Smoking status: Never   Smokeless  tobacco: Never  Vaping Use   Vaping status: Never Used  Substance and Sexual Activity   Alcohol use: No    Alcohol/week: 0.0 standard drinks of alcohol   Drug use: No   Sexual activity: Not Currently    Birth control/protection: Post-menopausal  Other Topics Concern   Not on file  Social History Narrative   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   Widowed 2021.  Was married 1962; 1 child   Drinks coffee and coke daily    Social Drivers of Corporate investment banker Strain: Low Risk  (12/11/2022)   Overall Financial Resource Strain (CARDIA)    Difficulty of Paying Living Expenses: Not hard at all  Food Insecurity: No Food Insecurity (12/11/2022)   Hunger Vital Sign    Worried About Running Out of Food in the Last Year: Never true    Ran Out of Food in the Last Year: Never true  Transportation Needs: No Transportation Needs (12/11/2022)   PRAPARE - Administrator, Civil Service (Medical): No    Lack of Transportation (Non-Medical): No  Physical Activity: Insufficiently Active (12/11/2022)   Exercise Vital Sign    Days of Exercise per Week: 7 days    Minutes of Exercise per Session: 20 min  Stress: No Stress Concern Present (12/11/2022)   [employer] of Occupational Health - Occupational Stress Questionnaire    Feeling of Stress : Not at all  Social  Connections: Socially Isolated (12/11/2022)   Social Connection and Isolation Panel    Frequency of Communication with Friends and Family: More than three times a week    Frequency of Social Gatherings with Friends and Family: Never    Attends Religious Services: Never    Database administrator or Organizations: No    Attends Banker Meetings: Never    Marital Status: Widowed  Intimate Partner Violence: Not At Risk (12/11/2022)   Humiliation, Afraid, Rape, and Kick questionnaire    Fear of Current or Ex-Partner: No    Emotionally Abused: No    Physically Abused: No    Sexually Abused: No    Family History  Problem Relation Age of Onset   Lung cancer Father    Heart attack Mother    Lymphoma Mother        s/p chemo   Stroke Mother        x 2   Diabetes Brother    Breast cancer Maternal Aunt    Colon cancer Neg Hx     Review of Systems:  As stated in the HPI and otherwise negative.   There were no vitals taken for this visit.  Physical Examination: General: Well developed, well nourished, NAD  HEENT: OP clear, mucus membranes moist  SKIN: warm, dry. No rashes. Neuro: No focal deficits  Musculoskeletal: Muscle strength 5/5 all ext  Psychiatric: Mood and affect normal  Neck: No JVD, no carotid bruits, no thyromegaly, no lymphadenopathy.  Lungs:Clear bilaterally, no wheezes, rhonci, crackles Cardiovascular: Regular rate and rhythm. Systolic murmur.  Abdomen:Soft. Bowel sounds present. Non-tender.  Extremities: No lower extremity edema. Pulses are 2 + in the bilateral DP/PT.   EKG:  EKG is not *** ordered today. The ekg ordered today demonstrates   Recent Labs: 02/26/2023: ALT 9; BUN 12; Creatinine, Ser 0.88; Hemoglobin 15.2; Platelets 241.0; Potassium 4.6; Sodium 141 04/16/2023: TSH 0.39   Lipid Panel    Component Value Date/Time   CHOL 183 02/26/2023 0924  TRIG 137.0 02/26/2023 0924   HDL 49.80 02/26/2023 0924   CHOLHDL 4 02/26/2023 0924   VLDL 27.4  02/26/2023 0924   LDLCALC 106 (H) 02/26/2023 0924   LDLDIRECT 145.0 10/01/2014 1336     Wt Readings from Last 3 Encounters:  03/05/23 121 lb (54.9 kg)  12/21/22 131 lb 12.8 oz (59.8 kg)  12/11/22 141 lb (64 kg)    Assessment and Plan:   1. CAD without angina: No chest pain. Continue Plavix  (prior CVA and history of coronary stent).  She does not tolerate statins.   2. High grade symptomatic AV block: s/p permanent pacemaker placement. She is followed in the pacer clinic.   3. HTN: BP is controlled at home. Continue current therapy  4. Chronic diastolic CHF: Weight is stable. No volume overload on exam.   5. Aortic valve stenosis: Moderate by echo June 2025. Repeat echo in one year.    6. Mitral valve regurgitation: Mild by echo in June 2025.   Labs/ tests ordered today include:   No orders of the defined types were placed in this encounter.  Disposition:   F/U with me in 12  months  Signed, Lonni Cash, MD 09/15/2023 4:40 PM    [facility] [address] [place], [place], [place]  [address] Phone: [phone]; Fax: [phone]

## 2023-09-16 ENCOUNTER — Ambulatory Visit: Attending: Cardiovascular Disease | Admitting: Cardiovascular Disease

## 2023-09-16 ENCOUNTER — Encounter: Payer: Self-pay | Admitting: Cardiovascular Disease

## 2023-09-16 VITALS — BP 130/70 | HR 63 | Ht <= 58 in | Wt 120.2 lb

## 2023-09-16 DIAGNOSIS — I350 Nonrheumatic aortic (valve) stenosis: Secondary | ICD-10-CM

## 2023-09-16 DIAGNOSIS — I2510 Atherosclerotic heart disease of native coronary artery without angina pectoris: Secondary | ICD-10-CM

## 2023-09-16 DIAGNOSIS — I5032 Chronic diastolic (congestive) heart failure: Secondary | ICD-10-CM | POA: Diagnosis not present

## 2023-09-16 DIAGNOSIS — I441 Atrioventricular block, second degree: Secondary | ICD-10-CM | POA: Diagnosis not present

## 2023-09-16 DIAGNOSIS — I10 Essential (primary) hypertension: Secondary | ICD-10-CM | POA: Diagnosis not present

## 2023-09-16 DIAGNOSIS — I340 Nonrheumatic mitral (valve) insufficiency: Secondary | ICD-10-CM

## 2023-09-16 MED ORDER — NITROGLYCERIN 0.4 MG SL SUBL: SUBLINGUAL_TABLET | SUBLINGUAL | 3 refills | Status: AC

## 2023-09-16 NOTE — Patient Instructions (Signed)
 Medication Instructions:  Your physician recommends that you continue on your current medications as directed. Please refer to the Current Medication list given to you today.  *If you need a refill on your cardiac medications before your next appointment, please call your pharmacy*  Lab Work: none If you have labs (blood work) drawn today and your tests are completely normal, you will receive your results only by: MyChart Message (if you have MyChart) OR A paper copy in the mail If you have any lab test that is abnormal or we need to change your treatment, we will call you to review the results.  Testing/Procedures: Your physician has requested that you have an echocardiogram in June 2026. Echocardiography is a painless test that uses sound waves to create images of your heart. It provides your doctor with information about the size and shape of your heart and how well your heart's chambers and valves are working. This procedure takes approximately one hour. There are no restrictions for this procedure. Please do NOT wear cologne, perfume, aftershave, or lotions (deodorant is allowed). Please arrive 15 minutes prior to your appointment time.  Please note: We ask at that you not bring children with you during ultrasound (echo/ vascular) testing. Due to room size and safety concerns, children are not allowed in the ultrasound rooms during exams. Our front office staff cannot provide observation of children in our lobby area while testing is being conducted. An adult accompanying a patient to their appointment will only be allowed in the ultrasound room at the discretion of the ultrasound technician under special circumstances. We apologize for any inconvenience.   Follow-Up: At [facility], you and your health needs are our priority.  As part of our continuing mission to provide you with exceptional heart care, our providers are all part of one team.  This team includes your primary  Cardiologist (physician) and Advanced Practice Providers or APPs (Physician Assistants and Nurse Practitioners) who all work together to provide you with the care you need, when you need it.  Your next appointment:   12 month(s)  Provider:   Lonni Cash, MD    We recommend signing up for the patient portal called MyChart.  Sign up information is provided on this After Visit Summary.  MyChart is used to connect with patients for Virtual Visits (Telemedicine).  Patients are able to view lab/test results, encounter notes, upcoming appointments, etc.  Non-urgent messages can be sent to your provider as well.   To learn more about what you can do with MyChart, go to [email or web address].   Other Instructions

## 2023-10-13 ENCOUNTER — Ambulatory Visit: Payer: Medicare HMO

## 2023-10-13 DIAGNOSIS — I441 Atrioventricular block, second degree: Secondary | ICD-10-CM | POA: Diagnosis not present

## 2023-10-14 LAB — CUP PACEART REMOTE DEVICE CHECK
Battery Impedance: 3072 Ohm
Battery Remaining Longevity: 20 mo
Battery Voltage: 2.72 V
Brady Statistic AP VP Percent: 42 %
Brady Statistic AP VS Percent: 0 %
Brady Statistic AS VP Percent: 58 %
Brady Statistic AS VS Percent: 0 %
Date Time Interrogation Session: 20251008075919
Implantable Lead Connection Status: 753985
Implantable Lead Connection Status: 753985
Implantable Lead Implant Date: 20131106
Implantable Lead Implant Date: 20131106
Implantable Lead Location: 753859
Implantable Lead Location: 753860
Implantable Lead Model: 5076
Implantable Lead Model: 5092
Implantable Pulse Generator Implant Date: 20131106
Lead Channel Impedance Value: 460 Ohm
Lead Channel Impedance Value: 568 Ohm
Lead Channel Pacing Threshold Amplitude: 0.5 V
Lead Channel Pacing Threshold Amplitude: 1 V
Lead Channel Pacing Threshold Pulse Width: 0.4 ms
Lead Channel Pacing Threshold Pulse Width: 0.4 ms
Lead Channel Setting Pacing Amplitude: 2 V
Lead Channel Setting Pacing Amplitude: 2.5 V
Lead Channel Setting Pacing Pulse Width: 0.4 ms
Lead Channel Setting Sensing Sensitivity: 2 mV
Zone Setting Status: 755011
Zone Setting Status: 755011

## 2023-10-15 NOTE — Progress Notes (Signed)
 Remote PPM Transmission

## 2023-10-20 ENCOUNTER — Ambulatory Visit (INDEPENDENT_AMBULATORY_CARE_PROVIDER_SITE_OTHER)

## 2023-10-20 DIAGNOSIS — Z23 Encounter for immunization: Secondary | ICD-10-CM | POA: Diagnosis not present

## 2023-10-21 ENCOUNTER — Ambulatory Visit: Payer: Self-pay | Admitting: Cardiovascular Disease

## 2024-01-12 ENCOUNTER — Telehealth: Payer: Self-pay

## 2024-01-12 NOTE — Telephone Encounter (Signed)
 Pt called in to advise her handheld for her remote monitor is not working.  She has contacted [employer] and they are sending her a new one.  She should receive it in 8-10 days.  Reassured Pt that she will be fine until that comes.  Advised would call her next week to make sure we received her transmission and all is well.  Pt overdue for follow up.  Scheduled to see Daphne on February 13.   Reminder created to follow up with Pt next week.

## 2024-01-17 ENCOUNTER — Ambulatory Visit

## 2024-01-17 DIAGNOSIS — I441 Atrioventricular block, second degree: Secondary | ICD-10-CM | POA: Diagnosis not present

## 2024-01-17 LAB — CUP PACEART REMOTE DEVICE CHECK
Battery Impedance: 3253 Ohm
Battery Remaining Longevity: 19 mo
Battery Voltage: 2.71 V
Brady Statistic AP VP Percent: 42 %
Brady Statistic AP VS Percent: 0 %
Brady Statistic AS VP Percent: 58 %
Brady Statistic AS VS Percent: 0 %
Date Time Interrogation Session: 20260112080554
Implantable Lead Connection Status: 753985
Implantable Lead Connection Status: 753985
Implantable Lead Implant Date: 20131106
Implantable Lead Implant Date: 20131106
Implantable Lead Location: 753859
Implantable Lead Location: 753860
Implantable Lead Model: 5076
Implantable Lead Model: 5092
Implantable Pulse Generator Implant Date: 20131106
Lead Channel Impedance Value: 455 Ohm
Lead Channel Impedance Value: 566 Ohm
Lead Channel Pacing Threshold Amplitude: 0.5 V
Lead Channel Pacing Threshold Amplitude: 1 V
Lead Channel Pacing Threshold Pulse Width: 0.4 ms
Lead Channel Pacing Threshold Pulse Width: 0.4 ms
Lead Channel Setting Pacing Amplitude: 2 V
Lead Channel Setting Pacing Amplitude: 2.5 V
Lead Channel Setting Pacing Pulse Width: 0.4 ms
Lead Channel Setting Sensing Sensitivity: 2 mV
Zone Setting Status: 755011
Zone Setting Status: 755011

## 2024-01-18 NOTE — Progress Notes (Signed)
 Remote PPM Transmission

## 2024-01-19 NOTE — Telephone Encounter (Signed)
 Outreach made to Pt.  Advised transmission was received.  Reminded of follow up scheduled February 13.

## 2024-01-21 ENCOUNTER — Ambulatory Visit: Payer: Self-pay | Admitting: Cardiovascular Disease

## 2024-02-10 ENCOUNTER — Other Ambulatory Visit: Payer: Self-pay | Admitting: Family Medicine

## 2024-02-18 ENCOUNTER — Ambulatory Visit: Admitting: Pulmonary Disease

## 2024-03-03 ENCOUNTER — Other Ambulatory Visit

## 2024-03-03 ENCOUNTER — Ambulatory Visit

## 2024-03-10 ENCOUNTER — Encounter: Admitting: Family Medicine

## 2024-04-17 ENCOUNTER — Ambulatory Visit

## 2024-06-06 ENCOUNTER — Other Ambulatory Visit (HOSPITAL_COMMUNITY)

## 2024-07-17 ENCOUNTER — Ambulatory Visit

## 2024-10-16 ENCOUNTER — Ambulatory Visit

## 2025-01-15 ENCOUNTER — Ambulatory Visit
